# Patient Record
Sex: Male | Born: 1985 | Race: Black or African American | Hispanic: No | Marital: Single | Smoking: Never smoker
Health system: Southern US, Community
[De-identification: ages and names within clinical notes are randomized; demographics above are authoritative.]

## PROBLEM LIST (undated history)

## (undated) ENCOUNTER — Emergency Department (HOSPITAL_BASED_OUTPATIENT_CLINIC_OR_DEPARTMENT_OTHER): Admission: EM | Payer: Medicaid Other

## (undated) DIAGNOSIS — I639 Cerebral infarction, unspecified: Secondary | ICD-10-CM

## (undated) DIAGNOSIS — N1831 Chronic kidney disease, stage 3a: Secondary | ICD-10-CM

## (undated) DIAGNOSIS — I10 Essential (primary) hypertension: Secondary | ICD-10-CM

---
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
## 2007-09-22 ENCOUNTER — Emergency Department (HOSPITAL_COMMUNITY): Admission: EM | Admit: 2007-09-22 | Discharge: 2007-09-22 | Payer: Self-pay | Admitting: Emergency Medicine

## 2008-02-14 ENCOUNTER — Emergency Department (HOSPITAL_COMMUNITY): Admission: EM | Admit: 2008-02-14 | Discharge: 2008-02-14 | Payer: Self-pay | Admitting: Emergency Medicine

## 2009-08-01 ENCOUNTER — Emergency Department (HOSPITAL_COMMUNITY): Admission: EM | Admit: 2009-08-01 | Discharge: 2009-08-01 | Payer: Self-pay | Admitting: Family Medicine

## 2009-08-02 ENCOUNTER — Emergency Department (HOSPITAL_COMMUNITY): Admission: EM | Admit: 2009-08-02 | Discharge: 2009-08-03 | Payer: Self-pay | Admitting: Emergency Medicine

## 2009-08-02 ENCOUNTER — Emergency Department (HOSPITAL_COMMUNITY): Admission: EM | Admit: 2009-08-02 | Discharge: 2009-08-02 | Payer: Self-pay | Admitting: Family Medicine

## 2009-12-14 ENCOUNTER — Emergency Department (HOSPITAL_BASED_OUTPATIENT_CLINIC_OR_DEPARTMENT_OTHER): Admission: EM | Admit: 2009-12-14 | Discharge: 2009-12-14 | Payer: Self-pay | Admitting: Emergency Medicine

## 2010-09-24 LAB — DIFFERENTIAL
Basophils Relative: 0 % (ref 0–1)
Eosinophils Relative: 0 % (ref 0–5)
Lymphocytes Relative: 24 % (ref 12–46)
Lymphs Abs: 1.6 10*3/uL (ref 0.7–4.0)
Monocytes Absolute: 0.9 10*3/uL (ref 0.1–1.0)
Monocytes Relative: 14 % — ABNORMAL HIGH (ref 3–12)
Neutro Abs: 4.2 10*3/uL (ref 1.7–7.7)
Neutrophils Relative %: 62 % (ref 43–77)

## 2010-09-24 LAB — POCT I-STAT, CHEM 8
Calcium, Ion: 1.06 mmol/L — ABNORMAL LOW (ref 1.12–1.32)
HCT: 51 % (ref 39.0–52.0)
Hemoglobin: 17.3 g/dL — ABNORMAL HIGH (ref 13.0–17.0)
TCO2: 26 mmol/L (ref 0–100)

## 2010-09-24 LAB — CBC
Hemoglobin: 15.6 g/dL (ref 13.0–17.0)
MCHC: 33.5 g/dL (ref 30.0–36.0)
RDW: 12.4 % (ref 11.5–15.5)

## 2010-09-24 LAB — GLUCOSE, CAPILLARY: Glucose-Capillary: 301 mg/dL — ABNORMAL HIGH (ref 70–99)

## 2011-04-06 LAB — DIFFERENTIAL
Basophils Absolute: 0
Eosinophils Relative: 5
Lymphs Abs: 2
Monocytes Absolute: 0.3
Monocytes Relative: 7
Neutrophils Relative %: 50

## 2011-04-06 LAB — URINALYSIS, ROUTINE W REFLEX MICROSCOPIC
Bilirubin Urine: NEGATIVE
Hgb urine dipstick: NEGATIVE
Nitrite: NEGATIVE
Specific Gravity, Urine: 1.037 — ABNORMAL HIGH

## 2011-04-06 LAB — BASIC METABOLIC PANEL
Calcium: 9.7
GFR calc Af Amer: >60
GFR calc non Af Amer: >60
Sodium: 133 — ABNORMAL LOW

## 2011-04-06 LAB — CBC
HCT: 47.2
Hemoglobin: 16
WBC: 5.3

## 2011-04-06 LAB — URINE MICROSCOPIC-ADD ON: Urine-Other: NONE SEEN

## 2012-01-05 ENCOUNTER — Encounter (HOSPITAL_COMMUNITY): Payer: Self-pay

## 2012-01-05 ENCOUNTER — Emergency Department (HOSPITAL_COMMUNITY)
Admission: EM | Admit: 2012-01-05 | Discharge: 2012-01-05 | Disposition: A | Discharge status: Home / Self Care | Payer: 59 | Attending: Emergency Medicine | Admitting: Emergency Medicine

## 2012-01-05 DIAGNOSIS — Z886 Allergy status to analgesic agent status: Secondary | ICD-10-CM | POA: Insufficient documentation

## 2012-01-05 DIAGNOSIS — E119 Type 2 diabetes mellitus without complications: Secondary | ICD-10-CM | POA: Insufficient documentation

## 2012-01-05 DIAGNOSIS — R109 Unspecified abdominal pain: Secondary | ICD-10-CM

## 2012-01-05 DIAGNOSIS — R070 Pain in throat: Secondary | ICD-10-CM | POA: Insufficient documentation

## 2012-01-05 DIAGNOSIS — J029 Acute pharyngitis, unspecified: Secondary | ICD-10-CM

## 2012-01-05 DIAGNOSIS — Z794 Long term (current) use of insulin: Secondary | ICD-10-CM | POA: Insufficient documentation

## 2012-01-05 DIAGNOSIS — R197 Diarrhea, unspecified: Secondary | ICD-10-CM

## 2012-01-05 DIAGNOSIS — R1084 Generalized abdominal pain: Secondary | ICD-10-CM | POA: Insufficient documentation

## 2012-01-05 LAB — RAPID STREP SCREEN (MED CTR MEBANE ONLY): Streptococcus, Group A Screen (Direct): NEGATIVE

## 2012-01-05 LAB — POCT I-STAT, CHEM 8
BUN: 19 mg/dL (ref 6–23)
Chloride: 107 mEq/L (ref 96–112)
HCT: 46 % (ref 39.0–52.0)
Hemoglobin: 15.6 g/dL (ref 13.0–17.0)
Sodium: 139 mEq/L (ref 135–145)
TCO2: 20 mmol/L (ref 0–100)

## 2012-01-05 MED ORDER — HYDROCODONE-ACETAMINOPHEN 5-325 MG PO TABS: 1.0000 | ORAL_TABLET | Freq: Four times a day (QID) | ORAL | Status: AC | PRN

## 2012-01-05 MED ORDER — HYDROCODONE-ACETAMINOPHEN 5-325 MG PO TABS
1.0000 | ORAL_TABLET | Freq: Once | ORAL | Status: AC
  Administered 2012-01-05: 1 via ORAL
  Filled 2012-01-05: qty 1

## 2012-01-05 MED ORDER — ONDANSETRON HCL 4 MG PO TABS: 4.0000 mg | ORAL_TABLET | Freq: Three times a day (TID) | ORAL | Status: AC | PRN

## 2012-01-05 MED ORDER — ONDANSETRON 8 MG PO TBDP
8.0000 mg | ORAL_TABLET | Freq: Once | ORAL | Status: AC
  Administered 2012-01-05: 8 mg via ORAL
  Filled 2012-01-05: qty 1

## 2012-01-05 NOTE — ED Provider Notes (Signed)
Medical screening examination/treatment/procedure(s) were performed by non-physician practitioner and as supervising physician I was immediately available for consultation/collaboration.  Toy Baker, MD 01/05/12 1544

## 2012-01-05 NOTE — Discharge Instructions (Signed)
Read the information below.  Please drink plenty of fluids over the next few days.  If you develop severe abdominal pain, fevers, inability to tolerate fluids by mouth, or if you have trouble swallowing or breathing return to the ER immediately for a recheck. You may return to the ER at any time for worsening condition or any new symptoms that concern you.  Antibiotic Nonuse  Your caregiver felt that the infection or problem was not one that would be helped with an antibiotic. Infections may be caused by viruses or bacteria. Only a caregiver can tell which one of these is the likely cause of an illness. A cold is the most common cause of infection in both adults and children. A cold is a virus. Antibiotic treatment will have no effect on a viral infection. Viruses can lead to many lost days of work caring for sick children and many missed days of school. Children may catch as many as 10 "colds" or "flus" per year during which they can be tearful, cranky, and uncomfortable. The goal of treating a virus is aimed at keeping the ill person comfortable. Antibiotics are medications used to help the body fight bacterial infections. There are relatively few types of bacteria that cause infections but there are hundreds of viruses. While both viruses and bacteria cause infection they are very different types of germs. A viral infection will typically go away by itself within 7 to 10 days. Bacterial infections may spread or get worse without antibiotic treatment. Examples of bacterial infections are:  Sore throats (like strep throat or tonsillitis).   Infection in the lung (pneumonia).   Ear and skin infections.  Examples of viral infections are:  Colds or flus.   Most coughs and bronchitis.   Sore throats not caused by Strep.   Runny noses.  It is often best not to take an antibiotic when a viral infection is the cause of the problem. Antibiotics can kill off the helpful bacteria that we have inside our  body and allow harmful bacteria to start growing. Antibiotics can cause side effects such as allergies, nausea, and diarrhea without helping to improve the symptoms of the viral infection. Additionally, repeated uses of antibiotics can cause bacteria inside of our body to become resistant. That resistance can be passed onto harmful bacterial. The next time you have an infection it may be harder to treat if antibiotics are used when they are not needed. Not treating with antibiotics allows our own immune system to develop and take care of infections more efficiently. Also, antibiotics will work better for [place] when they are prescribed for bacterial infections. Treatments for a child that is ill may include:  Give extra fluids throughout the day to stay hydrated.   Get plenty of rest.   Only give your child over-the-counter or prescription medicines for pain, discomfort, or fever as directed by your caregiver.   The use of a cool mist humidifier may help stuffy noses.   Cold medications if suggested by your caregiver.  Your caregiver may decide to start you on an antibiotic if:  The problem you were seen for today continues for a longer length of time than expected.   You develop a secondary bacterial infection.  SEEK MEDICAL CARE IF:  Fever lasts longer than 5 days.   Symptoms continue to get worse after 5 to 7 days or become severe.   Difficulty in breathing develops.   Signs of dehydration develop (poor drinking, rare urinating, dark colored  urine).   Changes in behavior or worsening tiredness (listlessness or lethargy).  Document Released: 09/03/2001 Document Revised: 06/14/2011 Document Reviewed: 03/02/2009 [facility] Patient Information 2012 [place], [place].

## 2012-01-05 NOTE — ED Provider Notes (Signed)
History     CSN: [id number]  Arrival date & time 01/05/12  0714   First MD Initiated Contact with Patient 01/05/12 0801      Chief Complaint  Patient presents with  . Sore Throat  . Diarrhea    (Consider location/radiation/quality/duration/timing/severity/associated sxs/prior treatment) HPI Comments: Patient reports sore throat, abdominal pain and diarrhea that began yesterday.  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  Throat feels sore and swollen, similar to previous episodes of strep throat.  Abdominal pain is diffuse, has had nausea and diarrhea (4-5 episodes).  Diarrhea has appearance of "sweet potatoes" - denies blood, melena.  Denies fevers, vomiting, cough, nasal congestion.  Pt has hx type II diabetes, blood sugars have been in the mid-100s.  No hx abdominal surgeries.    Patient is a 26 y.o. male presenting with pharyngitis and diarrhea. The history is provided by the patient.  Sore Throat The current episode started yesterday. The problem occurs constantly. Associated symptoms include abdominal pain, chills, nausea and a sore throat. Pertinent negatives include no coughing, fever or vomiting.  Diarrhea The primary symptoms include abdominal pain, nausea and diarrhea. Primary symptoms do not include fever or vomiting.  The illness is also significant for chills.    Past Medical History  Diagnosis Date  . Diabetes mellitus     No past surgical history on file.  No family history on file.  History  Substance Use Topics  . Smoking status: Never Smoker   . Smokeless tobacco: Not on file  . Alcohol Use: Yes     socially      Review of Systems  Constitutional: Positive for chills. Negative for fever.  HENT: Positive for sore throat. Negative for ear pain and trouble swallowing.   Respiratory: Negative for cough and shortness of breath.   Gastrointestinal: Positive for nausea, abdominal pain and diarrhea. Negative for  vomiting.    Allergies  Ibuprofen  Home Medications   Current Outpatient Rx  Name Route Sig Dispense Refill  . INSULIN LISPRO (HUMAN) 100 UNIT/ML [place] SOLN Subcutaneous Inject 2 Units into the skin every hour. Diabetic insulin pump      BP 153/86  Pulse 95  Temp 98.8 F (37.1 C) (Oral)  Resp 16  SpO2 99%  Physical Exam  Nursing note and vitals reviewed. Constitutional: He is oriented to person, place, and time. He appears well-developed and well-nourished. No distress.  HENT:  Head: Normocephalic and atraumatic.  Nose: Nose normal.  Mouth/Throat: Uvula is midline. Mucous membranes are not dry. No uvula swelling. Posterior oropharyngeal erythema present. No oropharyngeal exudate, posterior oropharyngeal edema or tonsillar abscesses.  Neck: Trachea normal, normal range of motion and phonation normal. Neck supple. No tracheal tenderness present. No rigidity. No tracheal deviation and normal range of motion present.  Cardiovascular: Normal rate and regular rhythm.   Pulmonary/Chest: Effort normal and breath sounds normal. No stridor.  Abdominal: Soft. He exhibits no distension and no mass. There is no tenderness. There is no rebound and no guarding.  Neurological: He is alert and oriented to person, place, and time.  Skin: He is not diaphoretic.    ED Course  Procedures (including critical care time)  Labs Reviewed  POCT I-STAT, CHEM 8 - Abnormal; Notable for the following:    Potassium 3.4 (*)     Glucose, Bld 173 (*)     All other components within normal limits  RAPID STREP SCREEN   No results  found.  Filed Vitals:   01/05/12 0956  BP: 122/61  Pulse: 77  Temp: 98.4 F (36.9 C)  Resp: 18    9:30 AM Patient is tolerating PO fluids, symptoms improved.    1. Abdominal pain   2. Diarrhea   3. Sore throat       MDM  Pt with one day of diarrhea and sore throat.  Afebrile, nontoxic, abdominal exam is benign.   Rapid strep is negative.  Likely early viral  process.  Pt given return precautions.  D/C home with norco, zofran.  Patient verbalizes understanding and agrees with plan.          Dillard Cannon Ono, [place] 01/05/12 1232

## 2012-01-05 NOTE — ED Notes (Signed)
Pt states that sore throat, nausea and diarrhea started yesterday.  He has had diarrhea 4 times since yesterday.

## 2012-01-08 ENCOUNTER — Encounter (HOSPITAL_COMMUNITY): Payer: Self-pay | Admitting: Emergency Medicine

## 2012-01-08 ENCOUNTER — Emergency Department (HOSPITAL_COMMUNITY)
Admission: EM | Admit: 2012-01-08 | Discharge: 2012-01-09 | Disposition: A | Discharge status: Home / Self Care | Payer: 59 | Attending: Emergency Medicine | Admitting: Emergency Medicine

## 2012-01-08 DIAGNOSIS — E119 Type 2 diabetes mellitus without complications: Secondary | ICD-10-CM | POA: Insufficient documentation

## 2012-01-08 DIAGNOSIS — J040 Acute laryngitis: Secondary | ICD-10-CM

## 2012-01-08 LAB — RAPID STREP SCREEN (MED CTR MEBANE ONLY): Streptococcus, Group A Screen (Direct): NEGATIVE

## 2012-01-08 NOTE — ED Provider Notes (Signed)
History     CSN: [id number]  Arrival date & time 01/08/12  2115   First MD Initiated Contact with Patient 01/08/12 2214      Chief Complaint  Patient presents with  . Sore Throat    (Consider location/radiation/quality/duration/timing/severity/associated sxs/prior treatment) Patient is a 26 y.o. male presenting with pharyngitis.  Sore Throat Associated symptoms include coughing and a sore throat. Pertinent negatives include no fever.   26 year old male complaining of sore throat for roughly a week. Denies fever. Affirms cough, runny nose, hoarseness, pain is constant, not exacerbated by swallowing.  Past Medical History  Diagnosis Date  . Diabetes mellitus     History reviewed. No pertinent past surgical history.  History reviewed. No pertinent family history.  History  Substance Use Topics  . Smoking status: Never Smoker   . Smokeless tobacco: Not on file  . Alcohol Use: Yes     socially      Review of Systems  Constitutional: Negative for fever.  HENT: Positive for sore throat and rhinorrhea.   Respiratory: Positive for cough.   All other systems reviewed and are negative.    Allergies  Ibuprofen  Home Medications   Current Outpatient Rx  Name Route Sig Dispense Refill  . HYDROCODONE-ACETAMINOPHEN 5-325 MG PO TABS Oral Take 1 tablet by mouth every 6 (six) hours as needed for pain. 10 tablet 0  . INSULIN LISPRO (HUMAN) 100 UNIT/ML [place] SOLN Subcutaneous Inject 2 Units into the skin every hour. Diabetic insulin pump    . ONDANSETRON HCL 4 MG PO TABS Oral Take 1 tablet (4 mg total) by mouth every 8 (eight) hours as needed for nausea. 12 tablet 0    BP 139/77  Pulse 98  Temp 98.4 F (36.9 C) (Oral)  Resp 16  SpO2 100%  Physical Exam  Nursing note and vitals reviewed. Constitutional: He is oriented to person, place, and time. He appears well-developed and well-nourished. No distress.       Voice is hoarse  HENT:  Head: Normocephalic.  Right Ear:  External ear normal.  Left Ear: External ear normal.  Nose: Nose normal.  Mouth/Throat: Oropharynx is clear and moist. No oropharyngeal exudate.  Eyes: Conjunctivae and EOM are normal.  Neck:       Mild shotty, nontender AC LAD  Cardiovascular: Normal rate.   Pulmonary/Chest: Effort normal.  Musculoskeletal: Normal range of motion.  Neurological: He is alert and oriented to person, place, and time.  Psychiatric: He has a normal mood and affect.    ED Course  Procedures (including critical care time)   Labs Reviewed  RAPID STREP SCREEN  STREP A DNA PROBE  LAB REPORT - SCANNED   No results found.   1. Laryngitis, acute       MDM  Rapid strep negative. We'll send for DNA probe confirmation. Not consistent with strep by center criteria. We'll treat as laryngitis with vocal rest, and viscous lidocaine for comfort.  Pt verbalized understanding and agrees with care plan. Outpatient follow-up and return precautions given.           Wynetta Emery, PA-C 01/13/12 1025

## 2012-01-08 NOTE — ED Notes (Signed)
Patient states that he was here on Saturday with the same s/s - the patient reports that his throat is worse and he would like to have his throat be reevaluated

## 2012-01-09 LAB — STREP A DNA PROBE: Group A Strep Probe: NEGATIVE

## 2012-01-09 MED ORDER — BENZONATATE 100 MG PO CAPS: 100.0000 mg | ORAL_CAPSULE | Freq: Three times a day (TID) | ORAL | Status: AC

## 2012-01-09 MED ORDER — LIDOCAINE VISCOUS 2 % MT SOLN: 20.0000 mL | Freq: Three times a day (TID) | OROMUCOSAL | Status: AC | PRN

## 2012-01-16 NOTE — ED Provider Notes (Signed)
Medical screening examination/treatment/procedure(s) were performed by non-physician practitioner and as supervising physician I was immediately available for consultation/collaboration.  Yohan Samons, MD 01/16/12 0927 

## 2012-01-19 ENCOUNTER — Ambulatory Visit (INDEPENDENT_AMBULATORY_CARE_PROVIDER_SITE_OTHER): Payer: 59 | Admitting: Emergency Medicine

## 2012-01-19 VITALS — BP 130/70 | HR 80 | Temp 98.0°F | Resp 16 | Ht 67.75 in | Wt 198.6 lb

## 2012-01-19 DIAGNOSIS — R197 Diarrhea, unspecified: Secondary | ICD-10-CM

## 2012-01-19 DIAGNOSIS — E119 Type 2 diabetes mellitus without complications: Secondary | ICD-10-CM

## 2012-01-19 LAB — POCT CBC
HCT, POC: 49.8 % (ref 43.5–53.7)
Hemoglobin: 16.1 g/dL (ref 14.1–18.1)
MCHC: 32.3 g/dL (ref 31.8–35.4)
MCV: 87.1 fL (ref 80–97)
MPV: 8.5 fL (ref 0–99.8)
POC Granulocyte: 2 (ref 2–6.9)
Platelet Count, POC: 389 10*3/uL (ref 142–424)
RDW, POC: 13.8 %
WBC: 4.8 10*3/uL (ref 4.6–10.2)

## 2012-01-19 NOTE — Progress Notes (Signed)
  Subjective:    Patient ID: Bradley Davis, male    DOB: [birth year], 26 y.o.   MRN: [id number]  HPI patient is recently had 2 visits to the emergency room with abdominal pain and diarrhea. He is an insulin-dependent diabetic he states his sugars have been under reasonable control.    Review of Systems     Objective:   Physical Exam  Constitutional: He appears well-developed and well-nourished.  HENT:  Head: Normocephalic.  Eyes: Pupils are equal, round, and reactive to light.  Neck: No thyromegaly present.  Cardiovascular: Normal rate.   Pulmonary/Chest: Breath sounds normal. No respiratory distress. He has no wheezes. He has no rales.  Abdominal:       The abdomen is flat. Bowel sounds are normal. There is mild tenderness in the left lower abdomen.      Results for orders placed in visit on 01/19/12  POCT CBC      Component Value Range   WBC 4.8  4.6 - 10.2 K/uL   Lymph, poc 2.6  0.6 - 3.4   POC LYMPH PERCENT 53.3 (*) 10 - 50 %L   MID (cbc) 0.3  0 - 0.9   POC MID % 5.3  0 - 12 %M   POC Granulocyte 2.0  2 - 6.9   Granulocyte percent 41.4  37 - 80 %G   RBC 5.72  4.69 - 6.13 M/uL   Hemoglobin 16.1  14.1 - 18.1 g/dL   HCT, POC 13.0  86.5 - 53.7 %   MCV 87.1  80 - 97 fL   MCH, POC 28.1  27 - 31.2 pg   MCHC 32.3  31.8 - 35.4 g/dL   RDW, POC 78.4     Platelet Count, POC 389  142 - 424 K/uL   MPV 8.5  0 - 99.8 fL  GLUCOSE, POCT (MANUAL RESULT ENTRY)      Component Value Range   POC Glucose 154 (*) 70 - 99 mg/dl      Assessment & Plan:  Patient will be on none G2 lots of fluids he was given supplies to collect his stools.

## 2012-01-19 NOTE — Patient Instructions (Addendum)
Diarrhea Infections caused by germs (bacterial) or a virus commonly cause diarrhea. Your caregiver has determined that with time, rest and fluids, the diarrhea should improve. In general, eat normally while drinking more water than usual. Although water may prevent dehydration, it does not contain salt and minerals (electrolytes). Broths, weak tea without caffeine and oral rehydration solutions (ORS) replace fluids and electrolytes. Small amounts of fluids should be taken frequently. Large amounts at one time may not be tolerated. Plain water may be harmful in infants and the elderly. Oral rehydrating solutions (ORS) are available at pharmacies and grocery stores. ORS replace water and important electrolytes in proper proportions. Sports drinks are not as effective as ORS and may be harmful due to sugars worsening diarrhea.  ORS is especially recommended for use in children with diarrhea. As a general guideline for children, replace any new fluid losses from diarrhea and/or vomiting with ORS as follows:   If your child weighs 22 pounds or under (10 kg or less), give 60-120 mL ( -  cup or 2 - 4 ounces) of ORS for each episode of diarrheal stool or vomiting episode.   If your child weighs more than 22 pounds (more than 10 kgs), give 120-240 mL ( - 1 cup or 4 - 8 ounces) of ORS for each diarrheal stool or episode of vomiting.   While correcting for dehydration, children should eat normally. However, foods high in sugar should be avoided because this may worsen diarrhea. Large amounts of carbonated soft drinks, juice, gelatin desserts and other highly sugared drinks should be avoided.   After correction of dehydration, other liquids that are appealing to the child may be added. Children should drink small amounts of fluids frequently and fluids should be increased as tolerated. Children should drink enough fluids to keep urine clear or pale yellow.   Adults should eat normally while drinking more fluids  than usual. Drink small amounts of fluids frequently and increase as tolerated. Drink enough fluids to keep urine clear or pale yellow. Broths, weak decaffeinated tea, lemon lime soft drinks (allowed to go flat) and ORS replace fluids and electrolytes.   Avoid:   Carbonated drinks.   Juice.   Extremely hot or cold fluids.   Caffeine drinks.   Fatty, greasy foods.   Alcohol.   Tobacco.   Too much intake of anything at one time.   Gelatin desserts.   Probiotics are active cultures of beneficial bacteria. They may lessen the amount and number of diarrheal stools in adults. Probiotics can be found in yogurt with active cultures and in supplements.   Wash hands well to avoid spreading bacteria and virus.   Anti-diarrheal medications are not recommended for infants and children.   Only take over-the-counter or prescription medicines for pain, discomfort or fever as directed by your caregiver. Do not give aspirin to children because it may cause Reye's Syndrome.   For adults, ask your caregiver if you should continue all prescribed and over-the-counter medicines.   If your caregiver has given you a follow-up appointment, it is very important to keep that appointment. Not keeping the appointment could result in a chronic or permanent injury, and disability. If there is any problem keeping the appointment, you must call back to this facility for assistance.  SEEK IMMEDIATE MEDICAL CARE IF:   You or your child is unable to keep fluids down or other symptoms or problems become worse in spite of treatment.   Vomiting or diarrhea develops and becomes persistent.     There is vomiting of blood or bile (green material).   There is blood in the stool or the stools are black and tarry.   There is no urine output in 6-8 hours or there is only a small amount of very dark urine.   Abdominal pain develops, increases or localizes.   You have a fever.   Your baby is older than 3 months with a  rectal temperature of 102 F (38.9 C) or higher.   Your baby is 3 months old or younger with a rectal temperature of 100.4 F (38 C) or higher.   You or your child develops excessive weakness, dizziness, fainting or extreme thirst.   You or your child develops a rash, stiff neck, severe headache or become irritable or sleepy and difficult to awaken.  MAKE SURE YOU:   Understand these instructions.   Will watch your condition.   Will get help right away if you are not doing well or get worse.  Document Released: 06/15/2002 Document Revised: 06/14/2011 Document Reviewed: 05/02/2009 [employer] Patient Information 2012 [employer]. 

## 2012-01-25 ENCOUNTER — Telehealth: Payer: Self-pay | Admitting: Emergency Medicine

## 2012-01-29 LAB — STOOL CULTURE

## 2012-02-08 NOTE — Telephone Encounter (Signed)
Patient was called to return a stool specimen container he was given no encounter occurred.

## 2013-08-27 ENCOUNTER — Ambulatory Visit (INDEPENDENT_AMBULATORY_CARE_PROVIDER_SITE_OTHER): Payer: 59 | Admitting: Emergency Medicine

## 2013-08-27 VITALS — BP 138/72 | HR 122 | Temp 100.6°F | Resp 16 | Ht 68.0 in | Wt 233.0 lb

## 2013-08-27 DIAGNOSIS — J111 Influenza due to unidentified influenza virus with other respiratory manifestations: Secondary | ICD-10-CM

## 2013-08-27 MED ORDER — PSEUDOEPHEDRINE-GUAIFENESIN ER 60-600 MG PO TB12: 1.0000 | ORAL_TABLET | Freq: Two times a day (BID) | ORAL | Status: AC

## 2013-08-27 MED ORDER — OSELTAMIVIR PHOSPHATE 75 MG PO CAPS: 75.0000 mg | ORAL_CAPSULE | Freq: Two times a day (BID) | ORAL | Status: DC

## 2013-08-27 MED ORDER — HYDROCOD POLST-CHLORPHEN POLST 10-8 MG/5ML PO LQCR: 5.0000 mL | Freq: Two times a day (BID) | ORAL | Status: DC | PRN

## 2013-08-27 NOTE — Patient Instructions (Signed)

## 2013-08-27 NOTE — Progress Notes (Signed)
[facility] [address], [place] [id number] [phone]  Date:  08/27/2013   Name:  Gwynneth AlimentKimmen M Coran   DOB:  [birth year]   MRN:  [id number]  PCP:  No primary provider on file.    Chief Complaint: Fever, Chills, Headache, Edema and Cough   History of Present Illness:  Ison Bartholome BillM Lorincz is a 28 y.o. very pleasant male patient who presents with the following:  Tuesday night developed a runny nose.  Yesterday developed a fever and cough not productive.  Muscle aches and fatigue.  Arthralgias.  No sick contacts.  Fever and chills.  Poor po intake.  No nausea or vomiting.  No stool change or rash.  No improvement with over the counter medications or other home remedies. Denies other complaint or health concern today.   There are no active problems to display for this patient.   Past Medical History  Diagnosis Date  . Diabetes mellitus     No past surgical history on file.  History  Substance Use Topics  . Smoking status: Never Smoker   . Smokeless tobacco: Not on file  . Alcohol Use: Yes     Comment: socially    No family history on file.  Allergies  Allergen Reactions  . Ibuprofen Swelling    Medication list has been reviewed and updated.  Current Outpatient Prescriptions on File Prior to Visit  Medication Sig Dispense Refill  . insulin lispro (HUMALOG) 100 UNIT/ML injection Inject 2 Units into the skin every hour. Diabetic insulin pump       No current facility-administered medications on file prior to visit.    Review of Systems:  As per HPI, otherwise negative.    Physical Examination: Filed Vitals:   08/27/13 2052  BP: 138/72  Pulse: 122  Temp: 100.6 F (38.1 C)  Resp: 16   Filed Vitals:   08/27/13 2052  Height: 5\' 8"  (1.727 m)  Weight: 233 lb (105.688 kg)   Body mass index is 35.44 kg/(m^2). Ideal Body Weight: Weight in (lb) to have BMI = 25: 164.1  GEN: WDWN, NAD, Non-toxic, A & O x 3 HEENT: Atraumatic, Normocephalic.  Neck supple. No masses, No LAD. Ears and Nose: No external deformity. CV: RRR, No M/G/R. No JVD. No thrill. No extra heart sounds. PULM: CTA B, no wheezes, crackles, rhonchi. No retractions. No resp. distress. No accessory muscle use. ABD: S, NT, ND, +BS. No rebound. No HSM. EXTR: No c/c/e NEURO Normal gait.  PSYCH: Normally interactive. Conversant. Not depressed or anxious appearing.  Calm demeanor.    Assessment and Plan: Influenza tamiflu tussionex mucinex   Signed,  Phillips OdorJeffery Dareon Nunziato, MD

## 2013-10-08 ENCOUNTER — Ambulatory Visit (INDEPENDENT_AMBULATORY_CARE_PROVIDER_SITE_OTHER): Payer: 59 | Admitting: Family Medicine

## 2013-10-08 VITALS — BP 118/70 | HR 79 | Temp 97.9°F | Resp 16 | Ht 68.0 in | Wt 217.8 lb

## 2013-10-08 DIAGNOSIS — Z0000 Encounter for general adult medical examination without abnormal findings: Secondary | ICD-10-CM

## 2013-10-08 NOTE — Patient Instructions (Signed)
Please come and see us Monday morning (if you like, come down to [address]- I will be there Monday am) and we will place your PPD Take your DMV paperwork over to your endocrinology office and leave with the front desk.    Take care!

## 2013-10-08 NOTE — Progress Notes (Signed)
[facility] [address], [place] [id number] [phone]  Date:  10/08/2013   Name:  Bradley Davis   DOB:  Sep 05, 1985   MRN:  [id number]  PCP:  No primary provider on file.    Chief Complaint: Employment Physical   History of Present Illness:  Bradley Davis is a 28 y.o. very pleasant male patient who presents with the following:  He has a history of type 1 DM.  He uses a pump. 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  It seems he spoke to them and was told they did not do this paperwork- however I suspect they thought he needed a DOT certification.  Otherwise he denies any significant medical history.  His only chronic medication is his insulin.  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    He declines any labs or immunizations today.  Really just wants his form completed  There are no active problems to display for this patient.   Past Medical History  Diagnosis Date  . Diabetes mellitus     History reviewed. No pertinent past surgical history.  History  Substance Use Topics  . Smoking status: Never Smoker   . Smokeless tobacco: Not on file  . Alcohol Use: Yes     Comment: socially    No family history on file.  Allergies  Allergen Reactions  . Ibuprofen Swelling    Medication list has been reviewed and updated.  Current Outpatient Prescriptions on File Prior to Visit  Medication Sig Dispense Refill  . insulin lispro (HUMALOG) 100 UNIT/ML injection Inject 2 Units into the skin every hour. Diabetic insulin pump      . chlorpheniramine-HYDROcodone (TUSSIONEX PENNKINETIC ER) 10-8 MG/5ML LQCR Take 5 mLs by mouth every 12 (twelve) hours as needed.  60 mL  0  . oseltamivir (TAMIFLU) 75 MG capsule Take 1 capsule (75 mg total) by mouth 2 (two) times daily.  10 capsule  0  . pseudoephedrine-guaifenesin (MUCINEX D) 60-600 MG per tablet Take 1 tablet by mouth every 12  (twelve) hours.  18 tablet  0   No current facility-administered medications on file prior to visit.    Review of Systems:  As per HPI- otherwise negative.   Physical Examination: Filed Vitals:   10/08/13 0833  BP: 118/70  Pulse: 79  Temp: 97.9 F (36.6 C)  Resp: 16   Filed Vitals:   10/08/13 0833  Height: 5\' 8"  (1.727 m)  Weight: 217 lb 12.8 oz (98.793 kg)   Body mass index is 33.12 kg/(m^2). Ideal Body Weight: Weight in (lb) to have BMI = 25: 164.1  GEN: WDWN, NAD, Non-toxic, A & O x 3, looks well HEENT: Atraumatic, Normocephalic. Neck supple. No masses, No LAD. Ears and Nose: No external deformity. CV: RRR, No M/G/R. No JVD. No thrill. No extra heart sounds. PULM: CTA B, no wheezes, crackles, rhonchi. No retractions. No resp. distress. No accessory muscle use. ABD: S, NT, ND, +BS. No rebound. No HSM. EXTR: No c/c/e NEURO Normal gait.  PSYCH: Normally interactive. Conversant. Not depressed or anxious appearing.  Calm demeanor.  Gu: normal exam, no masses or lesions, no discharge   Assessment and Plan: Physical exam  Screening-pulmonary TB - Plan: TB Skin Test  Completed form for coaching track.  He believes all his immunizations are UTD, tetanus less than 10 years ago.   Planned to place  ppd but he is unable to return for a read this weekend.  He will have the PPD placed on Monday Called [facility]- it was a misunderstanding and they can do his DMV paperwork.  This would be most appropriate as the form requires detailed information about his diabetes which I do not have.    Signed Abbe Amsterdam, MD

## 2013-10-08 NOTE — Progress Notes (Signed)
  Tuberculosis Risk Questionnaire  1. No Were you born outside the [place] in one of the following parts of the world: [place], [place], [place], [place] or [place]?    2. No Have you traveled outside the [place] and lived for more than one month in one of the following parts of the world: [place], [place], [place], [place] or [place]?    3. Yes, Diabetes Do you have a compromised immune system such as from any of the following conditions:HIV/AIDS, organ or bone marrow transplantation, diabetes, immunosuppressive medicines (e.g. Prednisone, Remicaide), leukemia, lymphoma, cancer of the head or neck, gastrectomy or jejunal bypass, end-stage renal disease (on dialysis), or silicosis?     4. Yes Works in jail Have you ever or do you plan on working in: a residential care center, a health care facility, a jail or prison or homeless shelter?    5. No Have you ever: injected illegal drugs, used crack cocaine, lived in a homeless shelter  or been in jail or prison?     6. No Have you ever been exposed to anyone with infectious tuberculosis?    Tuberculosis Symptom Questionnaire  Do you currently have any of the following symptoms?  1. No Unexplained cough lasting more than 3 weeks?   2. No Unexplained fever lasting more than 3 weeks.   3. Yes  Night Sweats (sweating that leaves the bedclothes and sheets wet)     4. No Shortness of Breath   5. No Chest Pain   6. No Unintentional weight loss    7. No Unexplained fatigue (very tired for no reason)

## 2013-10-08 NOTE — Addendum Note (Signed)
Addended by: Abbe AmsterdamOPLAND, Yee Gangi C on: 10/08/2013 04:50 PM   Modules accepted: Level of Service

## 2013-10-12 ENCOUNTER — Ambulatory Visit (INDEPENDENT_AMBULATORY_CARE_PROVIDER_SITE_OTHER): Payer: 59 | Admitting: Family Medicine

## 2013-10-12 DIAGNOSIS — Z111 Encounter for screening for respiratory tuberculosis: Secondary | ICD-10-CM

## 2013-10-12 NOTE — Progress Notes (Signed)
Here just for a PPD today as he was not able to do this last week (could not RTC for a read).   Pt not examined- PPD placed

## 2013-10-12 NOTE — Progress Notes (Signed)
  Tuberculosis Risk Questionnaire  1. No Were you born outside the [place] in one of the following parts of the world: [place], [place], [place], [place] or [place]?    2. No Have you traveled outside the [place] and lived for more than one month in one of the following parts of the world: [place], [place], [place], [place] or [place]?    3. No Do you have a compromised immune system such as from any of the following conditions:HIV/AIDS, organ or bone marrow transplantation, diabetes, immunosuppressive medicines (e.g. Prednisone, Remicaide), leukemia, lymphoma, cancer of the head or neck, gastrectomy or jejunal bypass, end-stage renal disease (on dialysis), or silicosis?     4. Yes  Have you ever or do you plan on working in: a residential care center, a health care facility, a jail or prison or homeless shelter?    5. No Have you ever: injected illegal drugs, used crack cocaine, lived in a homeless shelter  or been in jail or prison?     6. No Have you ever been exposed to anyone with infectious tuberculosis?    Tuberculosis Symptom Questionnaire  Do you currently have any of the following symptoms?  1. No Unexplained cough lasting more than 3 weeks?   2. No Unexplained fever lasting more than 3 weeks.   3.yes Night Sweats (sweating that leaves the bedclothes and sheets wet)     4. No Shortness of Breath   5. No Chest Pain   6. No Unintentional weight loss    7. No Unexplained fatigue (very tired for no reason)

## 2013-10-14 ENCOUNTER — Ambulatory Visit (INDEPENDENT_AMBULATORY_CARE_PROVIDER_SITE_OTHER): Payer: 59

## 2013-10-14 DIAGNOSIS — Z111 Encounter for screening for respiratory tuberculosis: Secondary | ICD-10-CM

## 2013-10-14 LAB — TB SKIN TEST
Induration: 0 mm
TB Skin Test: NEGATIVE

## 2013-10-21 ENCOUNTER — Telehealth: Payer: Self-pay

## 2013-10-21 NOTE — Telephone Encounter (Signed)
Fax sent stating TB results were negative. No other information provided on the fax- pt advised.

## 2013-10-21 NOTE — Telephone Encounter (Signed)
PATIENT REQUEST WE FAX TO HIS EMPLOYER ATTN:MRS. JEANNIE HIS TB RESULTS.Marland Kitchen.   FAX: [phone]                                                                                                                                       PLEASE CALL PATIENT WHEN DONE.AT CELL PHONE: [phone]

## 2013-10-21 NOTE — Telephone Encounter (Signed)
Patient left message on lab phone.  No labs in chart.  Returned call and left message for patient to call.

## 2017-01-17 ENCOUNTER — Emergency Department (HOSPITAL_BASED_OUTPATIENT_CLINIC_OR_DEPARTMENT_OTHER): Payer: BC Managed Care – PPO

## 2017-01-17 ENCOUNTER — Emergency Department (HOSPITAL_BASED_OUTPATIENT_CLINIC_OR_DEPARTMENT_OTHER)
Admission: EM | Admit: 2017-01-17 | Discharge: 2017-01-17 | Disposition: A | Discharge status: Home / Self Care | Payer: BC Managed Care – PPO | Attending: Emergency Medicine | Admitting: Emergency Medicine

## 2017-01-17 ENCOUNTER — Encounter (HOSPITAL_BASED_OUTPATIENT_CLINIC_OR_DEPARTMENT_OTHER): Payer: Self-pay | Admitting: *Deleted

## 2017-01-17 DIAGNOSIS — Y939 Activity, unspecified: Secondary | ICD-10-CM | POA: Insufficient documentation

## 2017-01-17 DIAGNOSIS — S61111A Laceration without foreign body of right thumb with damage to nail, initial encounter: Secondary | ICD-10-CM | POA: Diagnosis not present

## 2017-01-17 DIAGNOSIS — W260XXA Contact with knife, initial encounter: Secondary | ICD-10-CM | POA: Insufficient documentation

## 2017-01-17 DIAGNOSIS — Z794 Long term (current) use of insulin: Secondary | ICD-10-CM | POA: Diagnosis not present

## 2017-01-17 DIAGNOSIS — S6991XA Unspecified injury of right wrist, hand and finger(s), initial encounter: Secondary | ICD-10-CM | POA: Diagnosis present

## 2017-01-17 DIAGNOSIS — Y999 Unspecified external cause status: Secondary | ICD-10-CM | POA: Diagnosis not present

## 2017-01-17 DIAGNOSIS — Y929 Unspecified place or not applicable: Secondary | ICD-10-CM | POA: Insufficient documentation

## 2017-01-17 DIAGNOSIS — E119 Type 2 diabetes mellitus without complications: Secondary | ICD-10-CM | POA: Diagnosis not present

## 2017-01-17 MED ORDER — LIDOCAINE HCL 2 % IJ SOLN: 10.0000 mL | Freq: Once | INTRAMUSCULAR | Status: DC

## 2017-01-17 MED ORDER — CEPHALEXIN 500 MG PO CAPS: 500.0000 mg | ORAL_CAPSULE | Freq: Four times a day (QID) | ORAL | 0 refills | Status: AC

## 2017-01-17 MED FILL — CEPHALEXIN 500 MG CAPSULE: 500 | 5 days supply | Qty: 20 | Fill #0

## 2017-01-17 NOTE — ED Notes (Signed)
Suture cart out side Pt. room

## 2017-01-17 NOTE — Discharge Instructions (Signed)
Keep wound dry and covered.  Take keflex as prescribed for antibiotic prophylaxis. Return for worsening symptoms, including bleeding under nail, fever, increased redness, swelling or any other symptoms concerning to you.

## 2017-01-17 NOTE — ED Provider Notes (Signed)
[facility]   CSN: [id number] Arrival date & time: 01/17/17  1336     History   Chief Complaint Chief Complaint  Patient presents with  . Laceration    HPI Bradley Davis is a 31 y.o. male.  The history is provided by the patient.  Laceration   The incident occurred 1 to 2 hours ago. Pain location: right thumb. Size: < 1 cm. The laceration mechanism was a a clean knife. The pain is mild. The pain has been constant since onset. He reports no foreign bodies present. His tetanus status is UTD.     31 year old male who presents with laceration to the the right thumb. History of DM. Accidentally cut the tip of the right thumb with knife while cutting peppers. Bleeding controlled with pressure. No other injuries.  Past Medical History:  Diagnosis Date  . Diabetes mellitus     There are no active problems to display for this patient.   History reviewed. No pertinent surgical history.     Home Medications    Prior to Admission medications   Medication Sig Start Date End Date Taking? Authorizing Provider  Insulin Aspart (NOVOLOG [place]) Inject into the skin.   Yes [provider]  insulin lispro (HUMALOG) 100 UNIT/ML injection Inject 2 Units into the skin every hour. Diabetic insulin pump    [provider]    Family History No family history on file.  Social History Social History  Substance Use Topics  . Smoking status: Never Smoker  . Smokeless tobacco: Never Used  . Alcohol use Yes     Comment: socially     Allergies   Ibuprofen   Review of Systems Review of Systems  Constitutional: Negative for fever.  Respiratory: Negative for shortness of breath.   Gastrointestinal: Negative for nausea and vomiting.  Musculoskeletal: Negative for joint swelling.  Skin: Positive for wound.  Allergic/Immunologic: Positive for immunocompromised state (h/o f DM).  Hematological: Does not bruise/bleed easily.     Physical  Exam Updated Vital Signs BP 115/77   Pulse 97   Temp 98.6 F (37 C) (Oral)   Resp 20   Ht 5\' 10"  (1.778 m)   Wt 98.4 kg (217 lb)   SpO2 99%   BMI 31.14 kg/m   Physical Exam Physical Exam  Constitutional: Appears well-developed and well-nourished. No acute distress. HENT:  Head: Normocephalic.  Eyes: Conjunctivae are normal.  Cardiovascular: Normal rate and intact distal pulses.   Pulmonary/Chest: Effort normal. No respiratory distress.  Abdominal: Exhibits no distension.  Musculoskeletal: Normal range of motion. Exhibits no deformity. There is < 1 cm laceration to the distal nail of the right thumb without active bleeding Neurological: Alert. Fluent speech.  Skin: Skin is warm and dry.  Psychiatric: Normal mood and affect. Behavior is normal.  Nursing note and vitals reviewed.   ED Treatments / Results  Labs (all labs ordered are listed, but only abnormal results are displayed) Labs Reviewed - No data to display  EKG  EKG Interpretation None       Radiology Dg Finger Thumb Right  Result Date: 01/17/2017 CLINICAL DATA:  Laceration with knife EXAM: RIGHT THUMB 2+V COMPARISON:  None. FINDINGS: Frontal, oblique, and lateral views obtained. There is soft tissue injury to the distal aspect the first digit with overlying bandage. No other radiopaque foreign body. No soft tissue air. No fracture or dislocation. Joint spaces appear unremarkable. IMPRESSION: Soft tissue injury distally with overlying bandage. No  other radiopaque foreign body beyond the bandage. No soft tissue air. No fracture or dislocation. No evident arthropathy. Electronically Signed   By: Bretta BangWilliam  Woodruff III M.D.   On: 01/17/2017 14:03    Procedures Procedures (including critical care time)  Medications Ordered in ED Medications - No data to display   Initial Impression / Assessment and Plan / ED Course  I have reviewed the triage vital signs and the nursing notes.  Pertinent labs & imaging  results that were available during my care of the patient were reviewed by me and considered in my medical decision making (see chart for details).     Small clean laceration to the distal aspect of the right thumb. Bleeding controlled, no subungual hematoma, proximal part of nail in tact. No severe nailbed injury. X-ray without foreign body or fracture. Not felt removing nail necessary for nailbed repair. Will dress, and continue supportive care. Due to DM, will discharge with keflex.   Final Clinical Impressions(s) / ED Diagnoses   Final diagnoses:  Laceration of right thumb without foreign body with damage to nail, initial encounter    New Prescriptions New Prescriptions   No medications on file     Lavera GuiseLiu, Kendall Justo Duo, MD 01/17/17 1536

## 2017-01-17 NOTE — ED Triage Notes (Signed)
Laceration with a knife this am. He cut through his right thumb nail. Bleeding controlled. [facility].

## 2017-04-07 ENCOUNTER — Ambulatory Visit (HOSPITAL_COMMUNITY)
Admission: EM | Admit: 2017-04-07 | Discharge: 2017-04-07 | Disposition: A | Discharge status: Home / Self Care | Payer: BC Managed Care – PPO

## 2017-04-07 ENCOUNTER — Encounter (HOSPITAL_COMMUNITY): Payer: Self-pay | Admitting: Emergency Medicine

## 2017-04-07 DIAGNOSIS — R197 Diarrhea, unspecified: Secondary | ICD-10-CM | POA: Diagnosis not present

## 2017-04-07 DIAGNOSIS — K529 Noninfective gastroenteritis and colitis, unspecified: Secondary | ICD-10-CM

## 2017-04-07 DIAGNOSIS — R110 Nausea: Secondary | ICD-10-CM | POA: Diagnosis not present

## 2017-04-07 HISTORY — DX: Essential (primary) hypertension: I10

## 2017-04-07 MED ORDER — ONDANSETRON 4 MG PO TBDP
ORAL_TABLET | ORAL | Status: AC
  Filled 2017-04-07: qty 1

## 2017-04-07 MED ORDER — SUCRALFATE 1 G PO TABS: 1.0000 g | ORAL_TABLET | Freq: Three times a day (TID) | ORAL | 0 refills | Status: DC

## 2017-04-07 MED ORDER — ONDANSETRON 4 MG PO TBDP
4.0000 mg | ORAL_TABLET | Freq: Once | ORAL | Status: AC
  Administered 2017-04-07: 4 mg via ORAL

## 2017-04-07 MED ORDER — ONDANSETRON HCL 4 MG PO TABS: 4.0000 mg | ORAL_TABLET | Freq: Four times a day (QID) | ORAL | 0 refills | Status: DC

## 2017-04-07 NOTE — ED Triage Notes (Signed)
Approximately one week ago, patient had 2 days of nausea, vomiting and diarrhea.  Yesterday, symptoms reoccurred.  Patient has had nausea, no vomiting this time and diarrhea.  5 episodes of diarrhea today.

## 2017-04-07 NOTE — ED Provider Notes (Signed)
[facility]    CSN: [id number] Arrival date & time: 04/07/17  1940     History   Chief Complaint Chief Complaint  Patient presents with  . Diarrhea    HPI Bradley Davis is a 31 y.o. male.   31 year old male presents with stomach cramping, nausea or reflux and sits of distention in his stomach. He said diarrhea today 5-7 times was loose initially then turned watery seen no blood. He feels like his reflux and stomach acid. He has taken Pepto and Tums with no relief. Denies fever or chills. Has been drinking carbonated drinks and water today      Past Medical History:  Diagnosis Date  . Diabetes mellitus   . Hypertension     There are no active problems to display for this patient.   History reviewed. No pertinent surgical history.     Home Medications    Prior to Admission medications   Medication Sig Start Date End Date Taking? Authorizing Provider  BENAZEPRIL HCL PO Take by mouth.   Yes [provider]  Insulin Aspart (NOVOLOG [place]) Inject into the skin.    [provider]  insulin lispro (HUMALOG) 100 UNIT/ML injection Inject 2 Units into the skin every hour. Diabetic insulin pump    [provider]  ondansetron (ZOFRAN) 4 MG tablet Take 1 tablet (4 mg total) by mouth every 6 (six) hours. 04/07/17   Hayden Rasmussen, NP  sucralfate (CARAFATE) 1 g tablet Take 1 tablet (1 g total) by mouth 4 (four) times daily -  with meals and at bedtime. 04/07/17   Hayden Rasmussen, NP    Family History No family history on file.  Social History Social History  Substance Use Topics  . Smoking status: Never Smoker  . Smokeless tobacco: Never Used  . Alcohol use Yes     Comment: socially     Allergies   Ibuprofen   Review of Systems Review of Systems  Constitutional: Positive for activity change. Negative for fever.  HENT: Negative.   Respiratory: Negative.   Gastrointestinal: Positive for diarrhea and nausea. Negative for blood in stool,  constipation and vomiting.       Complains of more of a crampy discomfort in the abdomen as opposed to [place] localized pain.  Genitourinary: Negative.   Skin: Negative.   Neurological: Negative.   All other systems reviewed and are negative.    Physical Exam Triage Vital Signs ED Triage Vitals  Enc Vitals Group     BP 04/07/17 2059 (!) 148/83     Pulse Rate 04/07/17 2059 98     Resp 04/07/17 2059 20     Temp 04/07/17 2059 98.4 F (36.9 C)     Temp Source 04/07/17 2059 Oral     SpO2 04/07/17 2059 100 %     Weight --      Height --      Head Circumference --      Peak Flow --      Pain Score 04/07/17 2057 5     Pain Loc --      Pain Edu? --      Excl. in GC? --    No data found.   Updated Vital Signs BP (!) 148/83 (BP Location: Left Arm)   Pulse 98   Temp 98.4 F (36.9 C) (Oral)   Resp 20   SpO2 100%   Visual Acuity Right Eye Distance:   Left Eye Distance:   Bilateral Distance:  Right Eye Near:   Left Eye Near:    Bilateral Near:     Physical Exam  Constitutional: He appears well-developed and well-nourished.  Eyes: EOM are normal.  Neck: Neck supple.  Cardiovascular: Normal rate, regular rhythm and normal heart sounds.   Pulmonary/Chest: Effort normal and breath sounds normal.  Abdominal: Soft. Bowel sounds are normal. He exhibits no mass. There is no rebound and no guarding.  Most of the abdomen percusses tympanic. Right lower quadrant dull.  Musculoskeletal: Normal range of motion. He exhibits no edema.  Skin: Skin is warm and dry.  Psychiatric: He has a normal mood and affect.  Nursing note and vitals reviewed.    UC Treatments / Results  Labs (all labs ordered are listed, but only abnormal results are displayed) Labs Reviewed - No data to display  EKG  EKG Interpretation None       Radiology No results found.  Procedures Procedures (including critical care time)  Medications Ordered in UC Medications - No data to  display   Initial Impression / Assessment and Plan / UC Course  I have reviewed the triage vital signs and the nursing notes.  Pertinent labs & imaging results that were available during my care of the patient were reviewed by me and considered in my medical decision making (see chart for details).    Likely a viral gastroenteritis. Also sounds like you are having some acid reflux. Take Zofran for nausea. Continue the Zantac, take the Carafate as directed to help coat the stomach. For diarrhea take one Imodium and if having more diarrhea in 3-4 hours she may take another one but do not take enough to stop the diarrhea. If you develop abdominal pain, distention of the abdomen and feel like you are getting worse he may have something other than an infection which would require additional evaluation that may include a scan. If that happens she should go to the emergency department for any worsening.    Final Clinical Impressions(s) / UC Diagnoses   Final diagnoses:  Gastroenteritis  Nausea  Diarrhea in adult patient    New Prescriptions New Prescriptions   ONDANSETRON (ZOFRAN) 4 MG TABLET    Take 1 tablet (4 mg total) by mouth every 6 (six) hours.   SUCRALFATE (CARAFATE) 1 G TABLET    Take 1 tablet (1 g total) by mouth 4 (four) times daily -  with meals and at bedtime.     Controlled Substance Prescriptions [place] Controlled Substance Registry consulted? Not Applicable   Hayden Rasmussen, NP 04/07/17 2233

## 2017-04-07 NOTE — Discharge Instructions (Signed)
Likely a viral gastroenteritis. Also sounds like you are having some acid reflux. Take Zofran for nausea. Continue the Zantac, take the Carafate as directed to help coat the stomach. For diarrhea take one Imodium and if having more diarrhea in 3-4 hours she may take another one but do not take enough to stop the diarrhea. If you develop abdominal pain, distention of the abdomen and feel like you are getting worse he may have something other than an infection which would require additional evaluation that may include a scan. If that happens she should go to the emergency department for any worsening.

## 2018-08-15 IMAGING — CR DG FINGER THUMB 2+V*R*
3 series · 3 of 3 positions shown · non-contrast
Comparison: None.

CLINICAL DATA: Laceration with knife

EXAM:
RIGHT THUMB 2+V

[x finger pa right (1 of 2)]
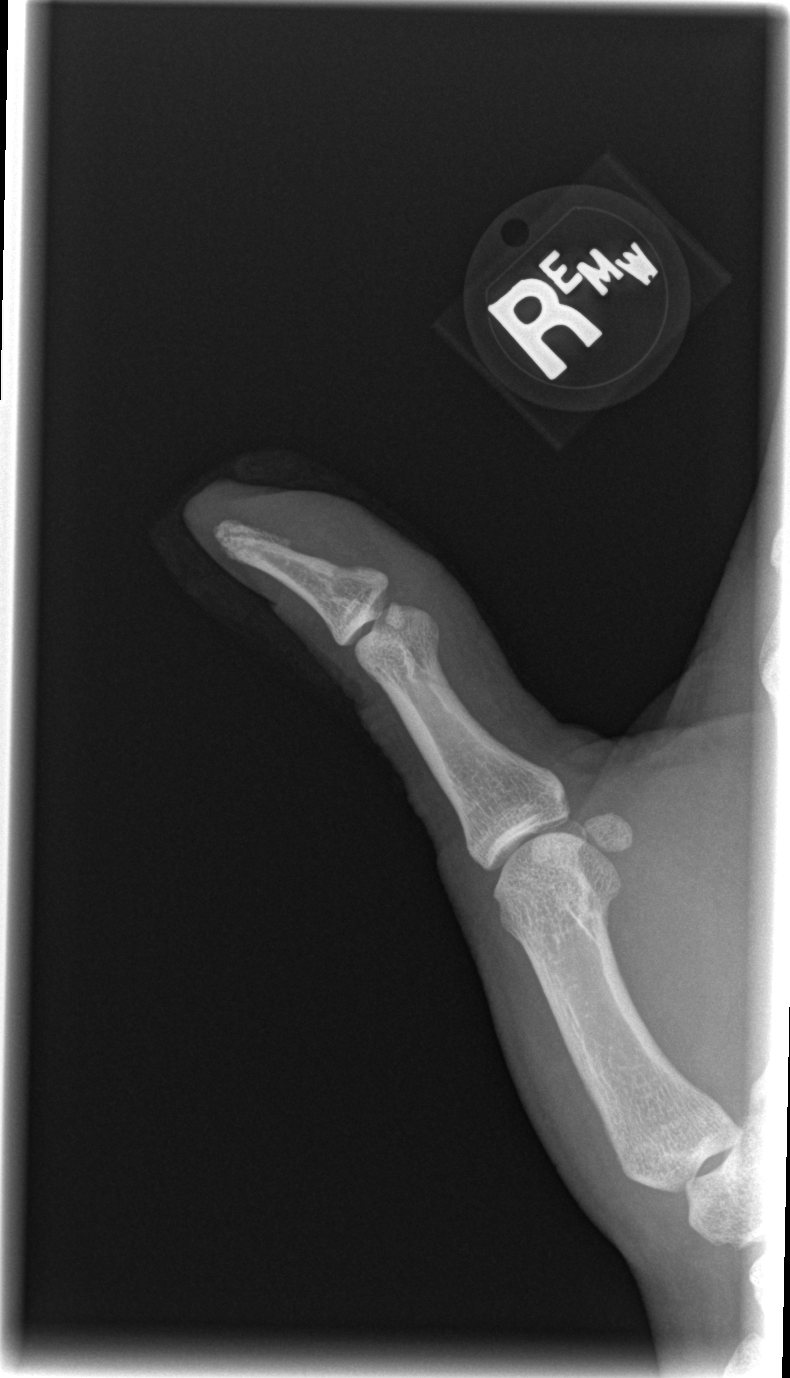

[x finger pa right (2 of 2)]
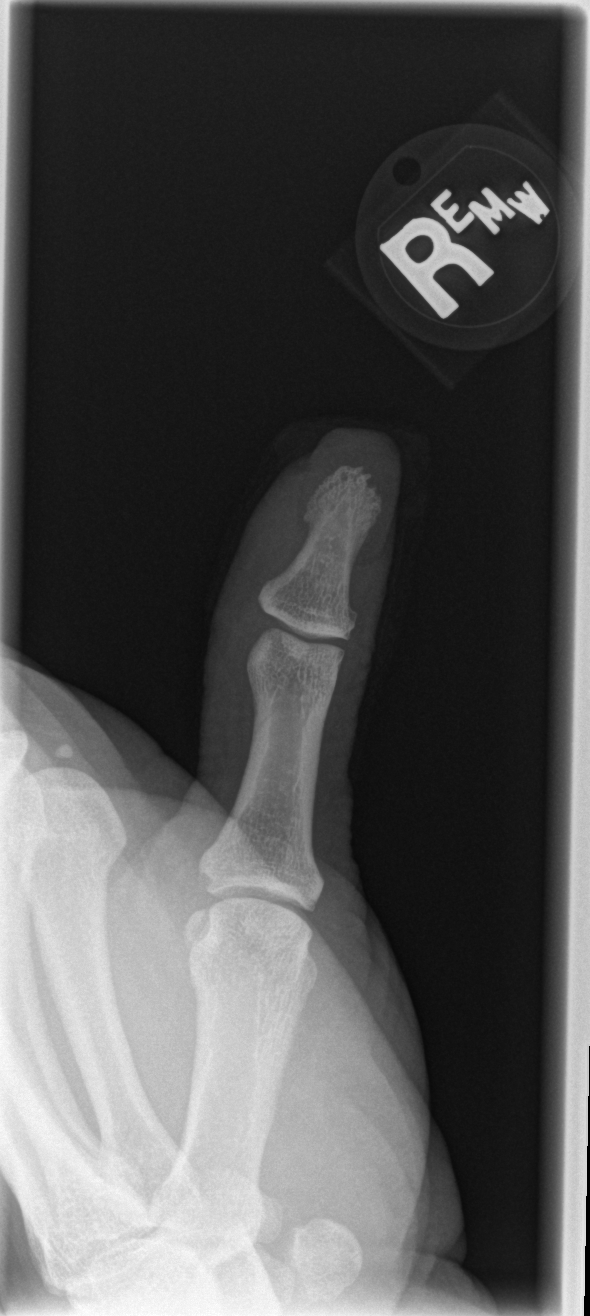

[x finger lateral right]
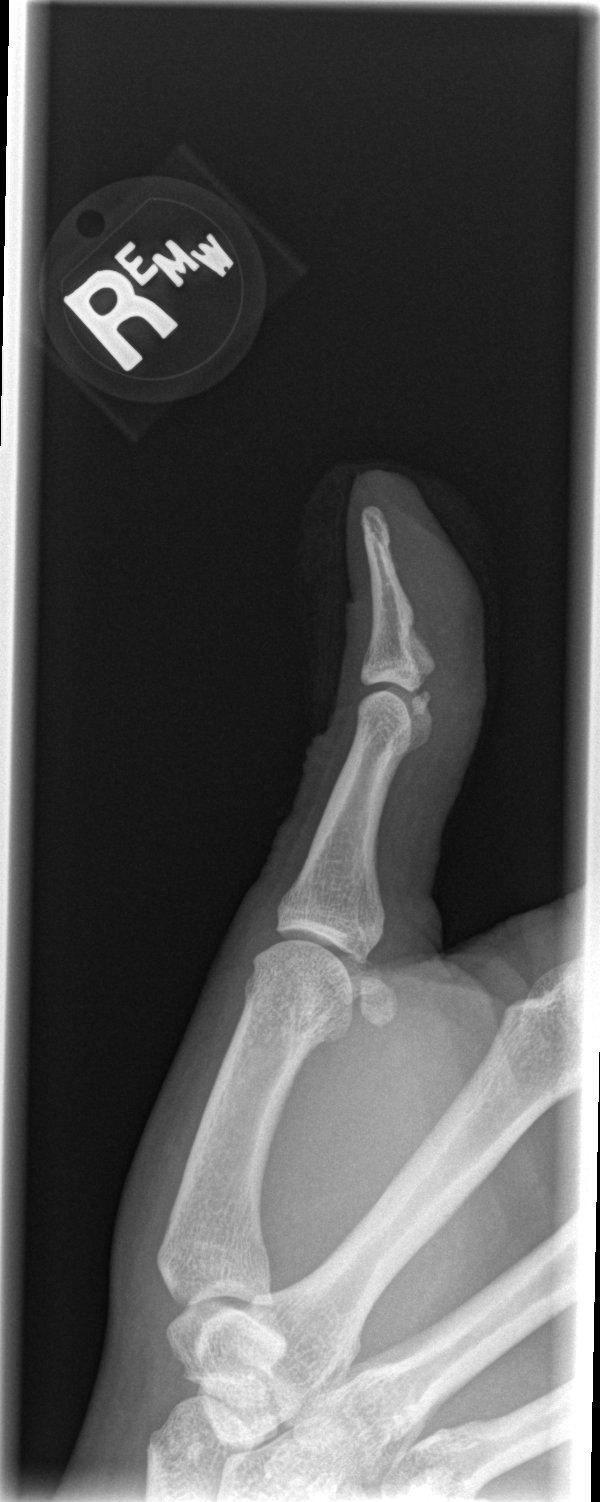

[3 of 3 positions shown; findings below may reference images not displayed]

FINDINGS: Frontal, oblique, and lateral views obtained. There is soft tissue
injury to the distal aspect the first digit with overlying bandage.
No other radiopaque foreign body. No soft tissue air. No fracture or
dislocation. Joint spaces appear unremarkable.
IMPRESSION: Soft tissue injury distally with overlying bandage. No other
radiopaque foreign body beyond the bandage. No soft tissue air. No
fracture or dislocation. No evident arthropathy.

## 2018-10-25 ENCOUNTER — Emergency Department (HOSPITAL_BASED_OUTPATIENT_CLINIC_OR_DEPARTMENT_OTHER)
Admission: EM | Admit: 2018-10-25 | Discharge: 2018-10-25 | Disposition: A | Discharge status: Home / Self Care | Payer: BC Managed Care – PPO | Attending: Emergency Medicine | Admitting: Emergency Medicine

## 2018-10-25 ENCOUNTER — Encounter (HOSPITAL_BASED_OUTPATIENT_CLINIC_OR_DEPARTMENT_OTHER): Payer: Self-pay

## 2018-10-25 ENCOUNTER — Other Ambulatory Visit: Payer: Self-pay

## 2018-10-25 DIAGNOSIS — Y929 Unspecified place or not applicable: Secondary | ICD-10-CM | POA: Insufficient documentation

## 2018-10-25 DIAGNOSIS — X102XXA Contact with fats and cooking oils, initial encounter: Secondary | ICD-10-CM | POA: Insufficient documentation

## 2018-10-25 DIAGNOSIS — Y93G9 Activity, other involving cooking and grilling: Secondary | ICD-10-CM | POA: Insufficient documentation

## 2018-10-25 DIAGNOSIS — T25122A Burn of first degree of left foot, initial encounter: Secondary | ICD-10-CM | POA: Insufficient documentation

## 2018-10-25 DIAGNOSIS — T25022A Burn of unspecified degree of left foot, initial encounter: Secondary | ICD-10-CM

## 2018-10-25 DIAGNOSIS — I10 Essential (primary) hypertension: Secondary | ICD-10-CM | POA: Insufficient documentation

## 2018-10-25 DIAGNOSIS — E119 Type 2 diabetes mellitus without complications: Secondary | ICD-10-CM | POA: Insufficient documentation

## 2018-10-25 DIAGNOSIS — Y999 Unspecified external cause status: Secondary | ICD-10-CM | POA: Insufficient documentation

## 2018-10-25 MED ORDER — CEPHALEXIN 500 MG PO CAPS: 500.0000 mg | ORAL_CAPSULE | Freq: Two times a day (BID) | ORAL | 0 refills | Status: AC

## 2018-10-25 MED ORDER — CEPHALEXIN 250 MG PO CAPS
500.0000 mg | ORAL_CAPSULE | Freq: Once | ORAL | Status: AC
  Administered 2018-10-25: 500 mg via ORAL
  Filled 2018-10-25: qty 2

## 2018-10-25 MED ORDER — SILVER SULFADIAZINE 1 % EX CREA
TOPICAL_CREAM | Freq: Once | CUTANEOUS | Status: AC
  Administered 2018-10-25: 22:00:00 via TOPICAL
  Filled 2018-10-25: qty 85

## 2018-10-25 MED ORDER — SILVER SULFADIAZINE 1 % EX CREA: 1.0000 "application " | TOPICAL_CREAM | Freq: Every day | CUTANEOUS | 0 refills | Status: DC

## 2018-10-25 NOTE — ED Notes (Signed)
ED Provider at bedside. 

## 2018-10-25 NOTE — ED Triage Notes (Signed)
Pt spilled hot grease to left foot- wound not visualized due to wrapped in triage. Pt reports putting ice and neosporin to burn PTA.

## 2018-10-25 NOTE — Discharge Instructions (Signed)
Keep the wound clean with soap and water. Make sure to pat dry the wound before covering it with any dressing. You can apply silvadene as directed.   You can take Tylenol or Ibuprofen as directed for pain. You can alternate Tylenol and Ibuprofen every 4 hours for additional pain relief.   Monitor closely for any signs of infection. Return to the Emergency Department for any worsening redness/swelling of the area that begins to spread, drainage from the site, worsening pain, fever or any other worsening or concerning symptoms.

## 2018-10-25 NOTE — ED Notes (Signed)
Discharge instructions reviewed. Pt instructed to avoid Ibuprofen as he is allergic. All questions answered. Pt verbalizes understanding.

## 2018-10-25 NOTE — ED Provider Notes (Signed)
[facility] EMERGENCY DEPARTMENT Provider Note   CSN: [id number] Arrival date & time: 10/25/18  2056    History   Chief Complaint No chief complaint on file.   HPI Bradley Davis is a 33 y.o. male history of diabetes, hypertension who presents for evaluation of burn to left foot that occurred about 45 minutes prior to ED arrival.  Patient reports that he was cooking and states that a small amount of grease spilled over and landed on his left ankle.  Patient states that he was wearing socks and shoes.  His tetanus is up-to-date.  He denies any numbness/weakness.  He is diabetic and is controlled by insulin.     The history is provided by the patient.    Past Medical History:  Diagnosis Date  . Diabetes mellitus   . Hypertension     There are no active problems to display for this patient.   History reviewed. No pertinent surgical history.      Home Medications    Prior to Admission medications   Medication Sig Start Date End Date Taking? Authorizing Provider  BENAZEPRIL HCL PO Take by mouth.    [provider]  cephALEXin (KEFLEX) 500 MG capsule Take 1 capsule (500 mg total) by mouth 2 (two) times daily for 7 days. 10/25/18 11/01/18  Maxwell CaulLayden, Lindsey A, PA-C  Insulin Aspart (NOVOLOG [place]) Inject into the skin.    [provider]  insulin lispro (HUMALOG) 100 UNIT/ML injection Inject 2 Units into the skin every hour. Diabetic insulin pump    [provider]  ondansetron (ZOFRAN) 4 MG tablet Take 1 tablet (4 mg total) by mouth every 6 (six) hours. 04/07/17   Hayden RasmussenMabe, David, NP  silver sulfADIAZINE (SILVADENE) 1 % cream Apply 1 application topically daily. 10/25/18   Maxwell CaulLayden, Lindsey A, PA-C  sucralfate (CARAFATE) 1 g tablet Take 1 tablet (1 g total) by mouth 4 (four) times daily -  with meals and at bedtime. 04/07/17   Hayden RasmussenMabe, David, NP    Family History No family history on file.  Social History Social History   Tobacco Use  . Smoking  status: Never Smoker  . Smokeless tobacco: Never Used  Substance Use Topics  . Alcohol use: Yes    Comment: socially  . Drug use: No     Allergies   Ibuprofen   Review of Systems Review of Systems  Constitutional: Negative for fever.  Skin: Positive for wound.  Neurological: Negative for weakness and numbness.  All other systems reviewed and are negative.    Physical Exam Updated Vital Signs BP (!) 148/82 (BP Location: Right Arm)   Pulse 99   Resp 17   Ht 5\' 10"  (1.778 m)   Wt 108.9 kg   SpO2 99%   BMI 34.44 kg/m   Physical Exam Vitals signs and nursing note reviewed.  Constitutional:      Appearance: He is well-developed.  HENT:     Head: Normocephalic and atraumatic.  Eyes:     General: No scleral icterus.       Right eye: No discharge.        Left eye: No discharge.     Conjunctiva/sclera: Conjunctivae normal.  Cardiovascular:     Pulses:          Dorsalis pedis pulses are 2+ on the right side and 2+ on the left side.  Pulmonary:     Effort: Pulmonary effort is normal.  Musculoskeletal:     Comments: Flexion/plantarflexion  of ankle intact with any difficulty.  He is able to move all 5 digits of left foot without any difficulty.  Skin:    General: Skin is warm and dry.     Comments: First-degree burn noted to the dorsal aspect of the left foot just proximal to the third, fourth, fifth digit with overlying blister.  3 x 2 cm area of first-degree burn noted to the dorsal aspect of the left foot that extends just right to the ankle joint line.  Neurological:     Mental Status: He is alert.     Comments: Sensation intact along major nerve distributions of BLE  Psychiatric:        Speech: Speech normal.        Behavior: Behavior normal.          ED Treatments / Results  Labs (all labs ordered are listed, but only abnormal results are displayed) Labs Reviewed - No data to display  EKG None  Radiology No results found.  Procedures Procedures  (including critical care time)  Medications Ordered in ED Medications  silver sulfADIAZINE (SILVADENE) 1 % cream ( Topical Given 10/25/18 2141)  cephALEXin (KEFLEX) capsule 500 mg (500 mg Oral Given 10/25/18 2151)     Initial Impression / Assessment and Plan / ED Course  I have reviewed the triage vital signs and the nursing notes.  Pertinent labs & imaging results that were available during my care of the patient were reviewed by me and considered in my medical decision making (see chart for details).        33 yo M with PMH/o Type 1 DM who presents for evaluation of burn to left foot that occurred 45 minutes prior to ED arrival.  Patient reports a small amount of grease fell on his foot while he was cooking.  He reports his tetanus is up-to-date. Patient is afebrile, non-toxic appearing, sitting comfortably on examination table. Vital signs reviewed and stable. Patient is neurovascularly intact.  Exam consistent with first-degree burn of left foot.  Burn is not circumferential.  He has 1 area first-degree burn that is blistered just proximal to the toes as well as another small area with no blister that just extends right to the ankle joint line.  Full range of motion of ankle intact without difficulty.  Will plan to clean wound, apply Silvadene and dressing. Given diabetes plan for antibiotics. Encouraged at home supportive care measures. Patient had ample opportunity for questions and discussion. All patient's questions were answered with full understanding. Strict return precautions discussed. Patient expresses understanding and agreement to plan.   Portions of this note were generated with Scientist, clinical (histocompatibility and immunogenetics). Dictation errors may occur despite best attempts at proofreading.   Final Clinical Impressions(s) / ED Diagnoses   Final diagnoses:  Burn of left foot, unspecified burn degree, initial encounter    ED Discharge Orders         Ordered    silver sulfADIAZINE (SILVADENE) 1  % cream  Daily     10/25/18 2148    cephALEXin (KEFLEX) 500 MG capsule  2 times daily     10/25/18 2148           Rosana Hoes 10/25/18 2322    Charlynne Pander, MD 10/26/18 2149

## 2018-11-03 ENCOUNTER — Other Ambulatory Visit: Payer: Self-pay

## 2018-11-03 ENCOUNTER — Emergency Department (HOSPITAL_BASED_OUTPATIENT_CLINIC_OR_DEPARTMENT_OTHER)
Admission: EM | Admit: 2018-11-03 | Discharge: 2018-11-03 | Disposition: A | Discharge status: Home / Self Care | Payer: Self-pay | Attending: Emergency Medicine | Admitting: Emergency Medicine

## 2018-11-03 ENCOUNTER — Encounter (HOSPITAL_BASED_OUTPATIENT_CLINIC_OR_DEPARTMENT_OTHER): Payer: Self-pay | Admitting: Emergency Medicine

## 2018-11-03 DIAGNOSIS — Y93G3 Activity, cooking and baking: Secondary | ICD-10-CM | POA: Insufficient documentation

## 2018-11-03 DIAGNOSIS — I10 Essential (primary) hypertension: Secondary | ICD-10-CM | POA: Insufficient documentation

## 2018-11-03 DIAGNOSIS — Z79899 Other long term (current) drug therapy: Secondary | ICD-10-CM | POA: Insufficient documentation

## 2018-11-03 DIAGNOSIS — T25022D Burn of unspecified degree of left foot, subsequent encounter: Secondary | ICD-10-CM | POA: Insufficient documentation

## 2018-11-03 DIAGNOSIS — Z5189 Encounter for other specified aftercare: Secondary | ICD-10-CM | POA: Insufficient documentation

## 2018-11-03 DIAGNOSIS — X102XXA Contact with fats and cooking oils, initial encounter: Secondary | ICD-10-CM | POA: Insufficient documentation

## 2018-11-03 DIAGNOSIS — Z794 Long term (current) use of insulin: Secondary | ICD-10-CM | POA: Insufficient documentation

## 2018-11-03 DIAGNOSIS — E119 Type 2 diabetes mellitus without complications: Secondary | ICD-10-CM | POA: Insufficient documentation

## 2018-11-03 MED ORDER — CEPHALEXIN 500 MG PO CAPS: 500.0000 mg | ORAL_CAPSULE | Freq: Two times a day (BID) | ORAL | 0 refills | Status: AC

## 2018-11-03 MED ORDER — CEPHALEXIN 500 MG PO CAPS: 500.0000 mg | ORAL_CAPSULE | Freq: Two times a day (BID) | ORAL | 0 refills | Status: DC

## 2018-11-03 NOTE — ED Provider Notes (Signed)
[facility] EMERGENCY DEPARTMENT Provider Note   CSN: [id number] Arrival date & time: 11/03/18  1103    History   Chief Complaint Chief Complaint  Patient presents with  . Wound Check    HPI Bradley Davis is a 33 y.o. male.     HPI   33 year old male with past medical history of hypertension and diabetes here with wound check.  The patient recently sustained a wound to his left anterior foot.  This was due to a grease burn while cooking.  Works as a [employer] banker, operationalchef.  He was seen in the ER and given prophylactic antibiotics and advised to continue Silvadene twice a day.  He has been taking the medications as prescribed.  He denies any increasing redness, pain, or drainage.  The wound seem to be healing well per his report.  Denies any other significant medical complaints.  However, he states that he began to notice a dried Hipple scab over the left lateral foot wound, so he returns for further evaluation.  His blood sugars have been at his baseline.  Denies any streaking pain or redness of his legs.  No history of previous poor wound healing or diabetic ulcers.  Past Medical History:  Diagnosis Date  . Diabetes mellitus   . Hypertension     There are no active problems to display for this patient.   History reviewed. No pertinent surgical history.      Home Medications    Prior to Admission medications   Medication Sig Start Date End Date Taking? Authorizing Provider  BENAZEPRIL HCL PO Take by mouth.    [provider]  cephALEXin (KEFLEX) 500 MG capsule Take 1 capsule (500 mg total) by mouth 2 (two) times daily for 7 days. 11/03/18 11/10/18  Shaune PollackIsaacs, Harold Mattes, MD  Insulin Aspart (NOVOLOG [place]) Inject into the skin.    [provider]  insulin lispro (HUMALOG) 100 UNIT/ML injection Inject 2 Units into the skin every hour. Diabetic insulin pump    [provider]  ondansetron (ZOFRAN) 4 MG tablet Take 1 tablet (4 mg total) by mouth every 6 (six) hours.  04/07/17   Hayden RasmussenMabe, David, NP  silver sulfADIAZINE (SILVADENE) 1 % cream Apply 1 application topically daily. 10/25/18   Maxwell CaulLayden, Lindsey A, PA-C  sucralfate (CARAFATE) 1 g tablet Take 1 tablet (1 g total) by mouth 4 (four) times daily -  with meals and at bedtime. 04/07/17   Hayden RasmussenMabe, David, NP    Family History No family history on file.  Social History Social History   Tobacco Use  . Smoking status: Never Smoker  . Smokeless tobacco: Never Used  Substance Use Topics  . Alcohol use: Yes    Comment: socially  . Drug use: No     Allergies   Ibuprofen   Review of Systems Review of Systems  Constitutional: Negative for chills and fever.  Respiratory: Negative for shortness of breath.   Cardiovascular: Negative for chest pain.  Musculoskeletal: Negative for neck pain.  Skin: Positive for wound. Negative for rash.  Allergic/Immunologic: Negative for immunocompromised state.  Neurological: Negative for weakness and numbness.  Hematological: Does not bruise/bleed easily.     Physical Exam Updated Vital Signs BP (!) 147/90 (BP Location: Right Arm)   Pulse 85   Temp 98.2 F (36.8 C)   Resp 14   Ht 5\' 10"  (1.778 m)   Wt 108.9 kg   SpO2 100%   BMI 34.44 kg/m   Physical Exam Vitals signs  and nursing note reviewed.  Constitutional:      General: He is not in acute distress.    Appearance: He is well-developed.  HENT:     Head: Normocephalic and atraumatic.  Eyes:     Conjunctiva/sclera: Conjunctivae normal.  Neck:     Musculoskeletal: Neck supple.  Cardiovascular:     Rate and Rhythm: Normal rate and regular rhythm.     Heart sounds: Normal heart sounds.  Pulmonary:     Effort: Pulmonary effort is normal. No respiratory distress.     Breath sounds: No wheezing.  Abdominal:     General: There is no distension.  Skin:    General: Skin is warm.     Capillary Refill: Capillary refill takes less than 2 seconds.     Findings: No rash.  Neurological:     Mental Status: He  is alert and oriented to person, place, and time.     Motor: No abnormal muscle tone.     LOWER EXTREMITY EXAM: RIGHT  INSPECTION & PALPATION: Healing ulcerative superficial wound to left anterior foot, with intact dry eschar. No surrounding TTP, redness, or warmth. No drainage or purulence. No crepitance.  SENSORY: sensation is intact to light touch in:  Superficial peroneal nerve distribution (over dorsum of foot) Deep peroneal nerve distribution (over first dorsal web space) Sural nerve distribution (over lateral aspect 5th metatarsal) Saphenous nerve distribution (over medial instep)  MOTOR:  + Motor EHL (great toe dorsiflexion) + FHL (great toe plantar flexion)  + TA (ankle dorsiflexion)  + GSC (ankle plantar flexion)  VASCULAR: 2+ dorsalis pedis and posterior tibialis pulses Capillary refill < 2 sec, toes warm and well-perfused  COMPARTMENTS: Soft, warm, well-perfused No pain with passive extension No parethesias     ED Treatments / Results  Labs (all labs ordered are listed, but only abnormal results are displayed) Labs Reviewed - No data to display  EKG None  Radiology No results found.  Procedures Procedures (including critical care time)  Medications Ordered in ED Medications - No data to display   Initial Impression / Assessment and Plan / ED Course  I have reviewed the triage vital signs and the nursing notes.  Pertinent labs & imaging results that were available during my care of the patient were reviewed by me and considered in my medical decision making (see chart for details).        33 yo M here with wound check. Burns appear to be healing well with intact, non-infected eschar. There is no evidence of secondary superinfection or deeper wound penetration. Will continue local wound care, d/c with good return precautions and wound care clinic referral given his high risk w/ diabetes. Discussed using a non-stick dressing and liberal  application of silvadene and/or antibiotic ointment to prevent local tissue damage and encourage intact eschar healing.  Final Clinical Impressions(s) / ED Diagnoses   Final diagnoses:  Visit for wound check    ED Discharge Orders         Ordered    cephALEXin (KEFLEX) 500 MG capsule  2 times daily     11/03/18 1201           Shaune Pollack, MD 11/03/18 1205

## 2018-11-03 NOTE — Discharge Instructions (Addendum)
Continue to wash the wound lightly with soap and water twice a day.  After washing, apply a thick layer of Silvadene cream and then wrapped in a nonstick dressing.  Continue to use a loose Kerlix or sports wrap.  Elevate your ankle above the level of your heart as often as possible.  Anytime you are at home, elevated on several pillows.  Decreasing the swelling will help the wounds heal and prevent infection.  I have also extended your antibiotic course for another several days, to help prevent infection given your history of diabetes.  Watch your blood sugars and try to stay on top of them.

## 2018-11-03 NOTE — ED Triage Notes (Signed)
Pt was treated  For left foot wound, on antibiotic, denies new symptoms, here for wound check up .

## 2020-07-09 HISTORY — PX: RETINAL DETACHMENT SURGERY: SHX105

## 2021-04-08 DIAGNOSIS — M726 Necrotizing fasciitis: Secondary | ICD-10-CM

## 2021-04-08 HISTORY — DX: Necrotizing fasciitis: M72.6

## 2021-04-21 ENCOUNTER — Inpatient Hospital Stay (HOSPITAL_BASED_OUTPATIENT_CLINIC_OR_DEPARTMENT_OTHER)
Admission: EM | Admit: 2021-04-21 | Discharge: 2021-05-01 | DRG: 853 | Disposition: A | Discharge status: Home / Self Care | Payer: Self-pay | Attending: Surgery | Admitting: Surgery

## 2021-04-21 ENCOUNTER — Emergency Department (HOSPITAL_BASED_OUTPATIENT_CLINIC_OR_DEPARTMENT_OTHER): Payer: Self-pay

## 2021-04-21 ENCOUNTER — Encounter (HOSPITAL_COMMUNITY): Admission: EM | Disposition: A | Discharge status: Home / Self Care | Payer: Self-pay | Source: Home / Self Care

## 2021-04-21 ENCOUNTER — Other Ambulatory Visit: Payer: Self-pay

## 2021-04-21 ENCOUNTER — Encounter (HOSPITAL_BASED_OUTPATIENT_CLINIC_OR_DEPARTMENT_OTHER): Payer: Self-pay | Admitting: *Deleted

## 2021-04-21 DIAGNOSIS — L02214 Cutaneous abscess of groin: Secondary | ICD-10-CM

## 2021-04-21 DIAGNOSIS — Z6835 Body mass index (BMI) 35.0-35.9, adult: Secondary | ICD-10-CM

## 2021-04-21 DIAGNOSIS — E1029 Type 1 diabetes mellitus with other diabetic kidney complication: Secondary | ICD-10-CM | POA: Diagnosis present

## 2021-04-21 DIAGNOSIS — E869 Volume depletion, unspecified: Secondary | ICD-10-CM | POA: Diagnosis present

## 2021-04-21 DIAGNOSIS — Z79899 Other long term (current) drug therapy: Secondary | ICD-10-CM

## 2021-04-21 DIAGNOSIS — L02415 Cutaneous abscess of right lower limb: Secondary | ICD-10-CM | POA: Diagnosis present

## 2021-04-21 DIAGNOSIS — Z20822 Contact with and (suspected) exposure to covid-19: Secondary | ICD-10-CM | POA: Diagnosis present

## 2021-04-21 DIAGNOSIS — E1022 Type 1 diabetes mellitus with diabetic chronic kidney disease: Secondary | ICD-10-CM | POA: Diagnosis present

## 2021-04-21 DIAGNOSIS — I10 Essential (primary) hypertension: Secondary | ICD-10-CM | POA: Diagnosis present

## 2021-04-21 DIAGNOSIS — Z886 Allergy status to analgesic agent status: Secondary | ICD-10-CM

## 2021-04-21 DIAGNOSIS — E785 Hyperlipidemia, unspecified: Secondary | ICD-10-CM | POA: Diagnosis present

## 2021-04-21 DIAGNOSIS — N179 Acute kidney failure, unspecified: Secondary | ICD-10-CM | POA: Diagnosis present

## 2021-04-21 DIAGNOSIS — A419 Sepsis, unspecified organism: Principal | ICD-10-CM | POA: Diagnosis present

## 2021-04-21 DIAGNOSIS — E669 Obesity, unspecified: Secondary | ICD-10-CM | POA: Diagnosis present

## 2021-04-21 DIAGNOSIS — Z794 Long term (current) use of insulin: Secondary | ICD-10-CM

## 2021-04-21 DIAGNOSIS — E871 Hypo-osmolality and hyponatremia: Secondary | ICD-10-CM | POA: Diagnosis present

## 2021-04-21 DIAGNOSIS — M726 Necrotizing fasciitis: Secondary | ICD-10-CM | POA: Diagnosis present

## 2021-04-21 DIAGNOSIS — R809 Proteinuria, unspecified: Secondary | ICD-10-CM | POA: Diagnosis present

## 2021-04-21 DIAGNOSIS — N1831 Chronic kidney disease, stage 3a: Secondary | ICD-10-CM | POA: Diagnosis present

## 2021-04-21 DIAGNOSIS — E1065 Type 1 diabetes mellitus with hyperglycemia: Secondary | ICD-10-CM | POA: Diagnosis present

## 2021-04-21 DIAGNOSIS — L03115 Cellulitis of right lower limb: Secondary | ICD-10-CM | POA: Diagnosis present

## 2021-04-21 DIAGNOSIS — I129 Hypertensive chronic kidney disease with stage 1 through stage 4 chronic kidney disease, or unspecified chronic kidney disease: Secondary | ICD-10-CM | POA: Diagnosis present

## 2021-04-21 DIAGNOSIS — E876 Hypokalemia: Secondary | ICD-10-CM | POA: Clinically undetermined

## 2021-04-21 DIAGNOSIS — Z9641 Presence of insulin pump (external) (internal): Secondary | ICD-10-CM | POA: Diagnosis present

## 2021-04-21 HISTORY — PX: INCISION AND DRAINAGE ABSCESS: SHX5864

## 2021-04-21 LAB — URINALYSIS, ROUTINE W REFLEX MICROSCOPIC
Bilirubin Urine: NEGATIVE
Glucose, UA: >=500 mg/dL — AB
Ketones, ur: NEGATIVE mg/dL
Leukocytes,Ua: NEGATIVE
Nitrite: NEGATIVE
Protein, ur: >300 mg/dL — AB
Specific Gravity, Urine: 1.01 (ref 1.005–1.030)
pH: 5.5 (ref 5.0–8.0)

## 2021-04-21 LAB — COMPREHENSIVE METABOLIC PANEL
ALT: 59 U/L — ABNORMAL HIGH (ref 0–44)
AST: 44 U/L — ABNORMAL HIGH (ref 15–41)
Albumin: 2.4 g/dL — ABNORMAL LOW (ref 3.5–5.0)
Alkaline Phosphatase: 126 U/L (ref 38–126)
Anion gap: 11 (ref 5–15)
BUN: 31 mg/dL — ABNORMAL HIGH (ref 6–20)
CO2: 22 mmol/L (ref 22–32)
Calcium: 7.9 mg/dL — ABNORMAL LOW (ref 8.9–10.3)
Chloride: 93 mmol/L — ABNORMAL LOW (ref 98–111)
Creatinine, Ser: 2.57 mg/dL — ABNORMAL HIGH (ref 0.61–1.24)
GFR, Estimated: 32 mL/min — ABNORMAL LOW (ref >60)
Glucose, Bld: 513 mg/dL (ref 70–99)
Potassium: 3.5 mmol/L (ref 3.5–5.1)
Sodium: 126 mmol/L — ABNORMAL LOW (ref 135–145)
Total Bilirubin: 0.7 mg/dL (ref 0.3–1.2)
Total Protein: 6.3 g/dL — ABNORMAL LOW (ref 6.5–8.1)

## 2021-04-21 LAB — CBC WITH DIFFERENTIAL/PLATELET
Abs Immature Granulocytes: 0.12 10*3/uL — ABNORMAL HIGH (ref 0.00–0.07)
Basophils Absolute: 0 10*3/uL (ref 0.0–0.1)
Basophils Relative: 0 %
Eosinophils Absolute: 0 10*3/uL (ref 0.0–0.5)
Eosinophils Relative: 0 %
HCT: 32.6 % — ABNORMAL LOW (ref 39.0–52.0)
Hemoglobin: 11.2 g/dL — ABNORMAL LOW (ref 13.0–17.0)
Immature Granulocytes: 1 %
Lymphocytes Relative: 7 %
Lymphs Abs: 1.1 10*3/uL (ref 0.7–4.0)
MCH: 27.9 pg (ref 26.0–34.0)
MCHC: 34.4 g/dL (ref 30.0–36.0)
MCV: 81.3 fL (ref 80.0–100.0)
Monocytes Absolute: 1.1 10*3/uL — ABNORMAL HIGH (ref 0.1–1.0)
Monocytes Relative: 7 %
Neutro Abs: 12.7 10*3/uL — ABNORMAL HIGH (ref 1.7–7.7)
Neutrophils Relative %: 85 %
Platelets: 273 10*3/uL (ref 150–400)
RBC: 4.01 MIL/uL — ABNORMAL LOW (ref 4.22–5.81)
RDW: 13.2 % (ref 11.5–15.5)
Smear Review: NORMAL
WBC: 15 10*3/uL — ABNORMAL HIGH (ref 4.0–10.5)
nRBC: 0 % (ref 0.0–0.2)

## 2021-04-21 LAB — CBG MONITORING, ED
Glucose-Capillary: 328 mg/dL — ABNORMAL HIGH (ref 70–99)
Glucose-Capillary: 235 mg/dL — ABNORMAL HIGH (ref 70–99)
Glucose-Capillary: 196 mg/dL — ABNORMAL HIGH (ref 70–99)

## 2021-04-21 LAB — BASIC METABOLIC PANEL
Anion gap: 10 (ref 5–15)
BUN: 31 mg/dL — ABNORMAL HIGH (ref 6–20)
CO2: 23 mmol/L (ref 22–32)
Calcium: 7.7 mg/dL — ABNORMAL LOW (ref 8.9–10.3)
Chloride: 95 mmol/L — ABNORMAL LOW (ref 98–111)
Creatinine, Ser: 2.55 mg/dL — ABNORMAL HIGH (ref 0.61–1.24)
GFR, Estimated: 33 mL/min — ABNORMAL LOW (ref >60)
Glucose, Bld: 261 mg/dL — ABNORMAL HIGH (ref 70–99)
Potassium: 3 mmol/L — ABNORMAL LOW (ref 3.5–5.1)
Sodium: 128 mmol/L — ABNORMAL LOW (ref 135–145)

## 2021-04-21 LAB — URINALYSIS, MICROSCOPIC (REFLEX)

## 2021-04-21 LAB — LACTIC ACID, PLASMA
Lactic Acid, Venous: 2.6 mmol/L (ref 0.5–1.9)
Lactic Acid, Venous: 2.3 mmol/L (ref 0.5–1.9)

## 2021-04-21 LAB — RESP PANEL BY RT-PCR (FLU A&B, COVID) ARPGX2
Influenza A by PCR: NEGATIVE
Influenza B by PCR: NEGATIVE
SARS Coronavirus 2 by RT PCR: NEGATIVE

## 2021-04-21 LAB — PROTIME-INR
INR: 1.3 — ABNORMAL HIGH (ref 0.8–1.2)
Prothrombin Time: 15.9 seconds — ABNORMAL HIGH (ref 11.4–15.2)

## 2021-04-21 LAB — APTT: aPTT: 38 seconds — ABNORMAL HIGH (ref 24–36)

## 2021-04-21 SURGERY — INCISION AND DRAINAGE, ABSCESS
Anesthesia: General | Site: Thigh | Laterality: Right

## 2021-04-21 MED ORDER — LACTATED RINGERS IV BOLUS
1000.0000 mL | Freq: Once | INTRAVENOUS | Status: AC
  Administered 2021-04-21: 1000 mL via INTRAVENOUS

## 2021-04-21 MED ORDER — ACETAMINOPHEN 325 MG PO TABS
650.0000 mg | ORAL_TABLET | Freq: Once | ORAL | Status: AC
  Administered 2021-04-21: 650 mg via ORAL
  Filled 2021-04-21: qty 2

## 2021-04-21 MED ORDER — INSULIN REGULAR(HUMAN) IN NACL 100-0.9 UT/100ML-% IV SOLN
INTRAVENOUS | Status: DC
  Administered 2021-04-21: 5.5 [IU]/h via INTRAVENOUS
  Filled 2021-04-21: qty 100

## 2021-04-21 MED ORDER — FENTANYL CITRATE (PF) 100 MCG/2ML IJ SOLN
INTRAMUSCULAR | Status: AC
  Filled 2021-04-21: qty 2

## 2021-04-21 MED ORDER — ONDANSETRON HCL 4 MG/2ML IJ SOLN
4.0000 mg | Freq: Once | INTRAMUSCULAR | Status: AC
  Administered 2021-04-21: 4 mg via INTRAVENOUS
  Filled 2021-04-21: qty 2

## 2021-04-21 MED ORDER — PIPERACILLIN-TAZOBACTAM 3.375 G IVPB 30 MIN
3.3750 g | Freq: Once | INTRAVENOUS | Status: AC
  Administered 2021-04-21: 3.375 g via INTRAVENOUS
  Filled 2021-04-21: qty 50

## 2021-04-21 MED ORDER — MORPHINE SULFATE (PF) 4 MG/ML IV SOLN
4.0000 mg | Freq: Once | INTRAVENOUS | Status: AC
  Administered 2021-04-21: 4 mg via INTRAVENOUS
  Filled 2021-04-21: qty 1

## 2021-04-21 MED ORDER — PROPOFOL 10 MG/ML IV BOLUS
INTRAVENOUS | Status: AC
  Filled 2021-04-21: qty 40

## 2021-04-21 MED ORDER — DEXTROSE 50 % IV SOLN: 0.0000 mL | INTRAVENOUS | Status: DC | PRN

## 2021-04-21 MED ORDER — CLINDAMYCIN PHOSPHATE 600 MG/50ML IV SOLN
600.0000 mg | Freq: Once | INTRAVENOUS | Status: AC
  Administered 2021-04-21: 600 mg via INTRAVENOUS
  Filled 2021-04-21: qty 50

## 2021-04-21 MED ORDER — ACETAMINOPHEN 500 MG PO TABS
ORAL_TABLET | ORAL | Status: AC
  Administered 2021-04-21: 1000 mg
  Filled 2021-04-21: qty 2

## 2021-04-21 MED ORDER — MIDAZOLAM HCL 2 MG/2ML IJ SOLN
INTRAMUSCULAR | Status: AC
  Filled 2021-04-21: qty 2

## 2021-04-21 MED ORDER — VANCOMYCIN HCL IN DEXTROSE 1-5 GM/200ML-% IV SOLN
1000.0000 mg | Freq: Once | INTRAVENOUS | Status: AC
  Administered 2021-04-21: 1000 mg via INTRAVENOUS
  Filled 2021-04-21: qty 200

## 2021-04-21 MED ORDER — INSULIN ASPART 100 UNIT/ML IJ SOLN
10.0000 [IU] | Freq: Once | INTRAMUSCULAR | Status: AC
  Administered 2021-04-21: 10 [IU] via SUBCUTANEOUS

## 2021-04-21 MED ORDER — LACTATED RINGERS IV SOLN: INTRAVENOUS | Status: DC

## 2021-04-21 MED ORDER — SODIUM CHLORIDE 0.9 % IV SOLN: 2.0000 g | Freq: Once | INTRAVENOUS | Status: DC

## 2021-04-21 MED ORDER — LIDOCAINE HCL (PF) 2 % IJ SOLN
INTRAMUSCULAR | Status: AC
  Filled 2021-04-21: qty 5

## 2021-04-21 MED ORDER — DEXTROSE IN LACTATED RINGERS 5 % IV SOLN: INTRAVENOUS | Status: DC

## 2021-04-21 SURGICAL SUPPLY — 27 items
BAG COUNTER SPONGE SURGICOUNT (BAG) IMPLANT
BLADE SURG 15 STRL LF DISP TIS (BLADE) ×1 IMPLANT
BLADE SURG 15 STRL SS (BLADE) ×1
BNDG GAUZE ELAST 4 BULKY (GAUZE/BANDAGES/DRESSINGS) ×2 IMPLANT
COVER SURGICAL LIGHT HANDLE (MISCELLANEOUS) ×2 IMPLANT
DECANTER SPIKE VIAL GLASS SM (MISCELLANEOUS) IMPLANT
DRAPE LAPAROSCOPIC ABDOMINAL (DRAPES) IMPLANT
DRSG PAD ABDOMINAL 8X10 ST (GAUZE/BANDAGES/DRESSINGS) ×4 IMPLANT
ELECT REM PT RETURN 15FT ADLT (MISCELLANEOUS) ×2 IMPLANT
GAUZE SPONGE 4X4 12PLY STRL (GAUZE/BANDAGES/DRESSINGS) ×2 IMPLANT
GLOVE SURG ENC MOIS LTX SZ7.5 (GLOVE) ×2 IMPLANT
KIT BASIN OR (CUSTOM PROCEDURE TRAY) ×2 IMPLANT
KIT TURNOVER KIT A (KITS) ×2 IMPLANT
NEEDLE HYPO 22GX1.5 SAFETY (NEEDLE) IMPLANT
NS IRRIG 1000ML POUR BTL (IV SOLUTION) ×2 IMPLANT
PACK BASIC VI WITH GOWN DISP (CUSTOM PROCEDURE TRAY) ×2 IMPLANT
PENCIL SMOKE EVACUATOR (MISCELLANEOUS) IMPLANT
SPONGE T-LAP 18X18 ~~LOC~~+RFID (SPONGE) ×2 IMPLANT
SUT MNCRL AB 4-0 PS2 18 (SUTURE) IMPLANT
SUT VIC AB 3-0 SH 27 (SUTURE)
SUT VIC AB 3-0 SH 27XBRD (SUTURE) IMPLANT
SWAB COLLECTION DEVICE MRSA (MISCELLANEOUS) IMPLANT
SWAB CULTURE ESWAB REG 1ML (MISCELLANEOUS) IMPLANT
SYR 20ML LL LF (SYRINGE) IMPLANT
TOWEL OR 17X26 10 PK STRL BLUE (TOWEL DISPOSABLE) ×2 IMPLANT
TOWEL OR NON WOVEN STRL DISP B (DISPOSABLE) ×2 IMPLANT
YANKAUER SUCT BULB TIP NO VENT (SUCTIONS) ×2 IMPLANT

## 2021-04-21 NOTE — ED Notes (Signed)
Attempted to obtain blood specimen x2RN and EMT. Unsuccessful

## 2021-04-21 NOTE — ED Notes (Signed)
ED Provider at bedside. 

## 2021-04-21 NOTE — H&P (Signed)
CC: Right groin/thigh pain    HPI: Bradley Davis is an 35 y.o. male who is transferred from [facility] after presenting there with severe right thigh and groin pain.  He started having discomfort several days ago.  He was seen at [facility] and placed on clindamycin.  His pain however worsened and he presented for further evaluation.  He was found to have an elevated white blood count, lactic acid level, and a CT scan showing a soft tissue infection in the right medial thigh and groin with air worrisome for possible necrotizing infection.  He reports no drainage from the area.  He is having severe pain in the area especially when trying to walk.  He denies fevers or chills.  He is otherwise without complaints.  He has had no previous similar infections.  He has no known cardiopulmonary problems  Past Medical History:  Diagnosis Date   Diabetes mellitus    Hypertension     History reviewed. No pertinent surgical history.  No family history on file.  Social:  reports that he has never smoked. He has never used smokeless tobacco. He reports that he does not currently use alcohol. He reports that he does not use drugs.  Allergies:  Allergies  Allergen Reactions   Ibuprofen Swelling    Pt reports taking after reported allergy w/ no reactions    Medications: I have reviewed the patient's current medications.  Results for orders placed or performed during the hospital encounter of 04/21/21 (from the past 48 hour(s))  CBC with Differential     Status: Abnormal   Collection Time: 04/21/21  5:48 PM  Result Value Ref Range   WBC 15.0 (H) 4.0 - 10.5 K/uL   RBC 4.01 (L) 4.22 - 5.81 MIL/uL   Hemoglobin 11.2 (L) 13.0 - 17.0 g/dL   HCT 16.1 (L) 09.6 - 04.5 %   MCV 81.3 80.0 - 100.0 fL   MCH 27.9 26.0 - 34.0 pg   MCHC 34.4 30.0 - 36.0 g/dL   RDW 40.9 81.1 - 91.4 %   Platelets 273 150 - 400 K/uL   nRBC 0.0 0.0 - 0.2 %   Neutrophils Relative % 85 %   Neutro Abs 12.7 (H)  1.7 - 7.7 K/uL   Lymphocytes Relative 7 %   Lymphs Abs 1.1 0.7 - 4.0 K/uL   Monocytes Relative 7 %   Monocytes Absolute 1.1 (H) 0.1 - 1.0 K/uL   Eosinophils Relative 0 %   Eosinophils Absolute 0.0 0.0 - 0.5 K/uL   Basophils Relative 0 %   Basophils Absolute 0.0 0.0 - 0.1 K/uL   WBC Morphology TOXIC GRANULATION    RBC Morphology MORPHOLOGY UNREMARKABLE    Smear Review Normal platelet morphology    Immature Granulocytes 1 %   Abs Immature Granulocytes 0.12 (H) 0.00 - 0.07 K/uL    Comment: Performed at [facility], [address] [place], [place] [address]  Comprehensive metabolic panel     Status: Abnormal   Collection Time: 04/21/21  5:48 PM  Result Value Ref Range   Sodium 126 (L) 135 - 145 mmol/L   Potassium 3.5 3.5 - 5.1 mmol/L   Chloride 93 (L) 98 - 111 mmol/L   CO2 22 22 - 32 mmol/L   Glucose, Bld 513 (HH) 70 - 99 mg/dL    Comment: Glucose reference range applies only to samples taken after fasting for at least 8 hours. CRITICAL RESULT CALLED TO,  READ BACK BY AND VERIFIED WITH: April Holding RN AT 1826 ON 04/21/21 BY [email or web address]    BUN 31 (H) 6 - 20 mg/dL   Creatinine, Ser 1.49 (H) 0.61 - 1.24 mg/dL   Calcium 7.9 (L) 8.9 - 10.3 mg/dL   Total Protein 6.3 (L) 6.5 - 8.1 g/dL   Albumin 2.4 (L) 3.5 - 5.0 g/dL   AST 44 (H) 15 - 41 U/L   ALT 59 (H) 0 - 44 U/L   Alkaline Phosphatase 126 38 - 126 U/L   Total Bilirubin 0.7 0.3 - 1.2 mg/dL   GFR, Estimated 32 (L) >60 mL/min    Comment: (NOTE) Calculated using the CKD-EPI Creatinine Equation (2021)    Anion gap 11 5 - 15    Comment: Performed at [facility], [address] [place], [place] [address]  Lactic acid, plasma     Status: Abnormal   Collection Time: 04/21/21  5:48 PM  Result Value Ref Range   Lactic Acid, Venous 2.6 (HH) 0.5 - 1.9 mmol/L    Comment: CRITICAL RESULT CALLED TO, READ BACK BY AND VERIFIED WITH: April Holding RN AT 1828 ON 04/21/21 BY [email or web address] Performed at [facility], [address] [place], [place] [address]   Resp Panel by RT-PCR (Flu A&B, Covid) Nasopharyngeal Swab     Status: None   Collection Time: 04/21/21  6:45 PM   Specimen: Nasopharyngeal Swab; Nasopharyngeal(NP) swabs in vial transport medium  Result Value Ref Range   SARS Coronavirus 2 by RT PCR NEGATIVE NEGATIVE    Comment: (NOTE) SARS-CoV-2 target nucleic acids are NOT DETECTED.  The SARS-CoV-2 RNA is generally detectable in upper respiratory specimens during the acute phase of infection. The lowest concentration of SARS-CoV-2 viral copies this assay can detect is 138 copies/mL. A negative result does not preclude SARS-Cov-2 infection and should not be used as the sole basis for treatment or other patient management decisions. A negative result may occur with  improper specimen collection/handling, submission of specimen other than nasopharyngeal swab, presence of viral mutation(s) within the areas targeted by this assay, and inadequate number of viral copies(<138 copies/mL). A negative result must be combined with clinical observations, patient history, and epidemiological information. The expected result is Negative.  Fact Sheet for Patients:  BloggerCourse.com  Fact Sheet for Healthcare Providers:  SeriousBroker.it  This test is no t yet approved or cleared by the [place] FDA and  has been authorized for detection and/or diagnosis of SARS-CoV-2 by FDA under an Emergency Use Authorization (EUA). This EUA will remain  in effect (meaning this test can be used) for the duration of the COVID-19 declaration under Section 564(b)(1) of the Act, 21 U.S.C.section 360bbb-3(b)(1), unless the authorization is terminated  or revoked sooner.       Influenza A by PCR NEGATIVE NEGATIVE   Influenza B by PCR NEGATIVE NEGATIVE    Comment: (NOTE) The Xpert Xpress SARS-CoV-2/FLU/RSV plus assay is intended as an aid in the diagnosis of  influenza from Nasopharyngeal swab specimens and should not be used as a sole basis for treatment. Nasal washings and aspirates are unacceptable for Xpert Xpress SARS-CoV-2/FLU/RSV testing.  Fact Sheet for Patients: BloggerCourse.com  Fact Sheet for Healthcare Providers: SeriousBroker.it  This test is not yet approved or cleared by the [place] FDA and has been authorized for detection and/or diagnosis of SARS-CoV-2 by FDA under an Emergency Use Authorization (EUA). This EUA will remain in effect (meaning this test can  be used) for the duration of the COVID-19 declaration under Section 564(b)(1) of the Act, 21 U.S.C. section 360bbb-3(b)(1), unless the authorization is terminated or revoked.  Performed at [facility], [address] [place], [place] [address]   Protime-INR     Status: Abnormal   Collection Time: 04/21/21  7:05 PM  Result Value Ref Range   Prothrombin Time 15.9 (H) 11.4 - 15.2 seconds   INR 1.3 (H) 0.8 - 1.2    Comment: (NOTE) INR goal varies based on device and disease states. Performed at [facility], [address] [place], [place] [address]   APTT     Status: Abnormal   Collection Time: 04/21/21  7:05 PM  Result Value Ref Range   aPTT 38 (H) 24 - 36 seconds    Comment:        IF BASELINE aPTT IS ELEVATED, SUGGEST PATIENT RISK ASSESSMENT BE USED TO DETERMINE APPROPRIATE ANTICOAGULANT THERAPY. Performed at [facility], [address] [place], [place] [address]   POC CBG, ED     Status: Abnormal   Collection Time: 04/21/21  8:22 PM  Result Value Ref Range   Glucose-Capillary 328 (H) 70 - 99 mg/dL    Comment: Glucose reference range applies only to samples taken after fasting for at least 8 hours.  Lactic acid, plasma     Status: Abnormal   Collection Time: 04/21/21  8:23 PM  Result Value Ref Range   Lactic Acid, Venous 2.3 (HH) 0.5 - 1.9 mmol/L    Comment:  CRITICAL RESULT CALLED TO, READ BACK BY AND VERIFIED WITH: LISA ADKINS RN AT 2107 ON 04/21/21 BY [email or web address] Performed at [facility], [address] [place], [place] [address]   Basic metabolic panel     Status: Abnormal   Collection Time: 04/21/21  9:35 PM  Result Value Ref Range   Sodium 128 (L) 135 - 145 mmol/L   Potassium 3.0 (L) 3.5 - 5.1 mmol/L   Chloride 95 (L) 98 - 111 mmol/L   CO2 23 22 - 32 mmol/L   Glucose, Bld 261 (H) 70 - 99 mg/dL    Comment: Glucose reference range applies only to samples taken after fasting for at least 8 hours.   BUN 31 (H) 6 - 20 mg/dL   Creatinine, Ser 3.08 (H) 0.61 - 1.24 mg/dL   Calcium 7.7 (L) 8.9 - 10.3 mg/dL   GFR, Estimated 33 (L) >60 mL/min    Comment: (NOTE) Calculated using the CKD-EPI Creatinine Equation (2021)    Anion gap 10 5 - 15    Comment: Performed at [facility], [address] [place], [place] [address]  CBG monitoring, ED     Status: Abnormal   Collection Time: 04/21/21 10:16 PM  Result Value Ref Range   Glucose-Capillary 235 (H) 70 - 99 mg/dL    Comment: Glucose reference range applies only to samples taken after fasting for at least 8 hours.  Urinalysis, Routine w reflex microscopic     Status: Abnormal   Collection Time: 04/21/21 10:25 PM  Result Value Ref Range   Color, Urine YELLOW YELLOW   APPearance CLEAR CLEAR   Specific Gravity, Urine 1.010 1.005 - 1.030   pH 5.5 5.0 - 8.0   Glucose, UA >=500 (A) NEGATIVE mg/dL   Hgb urine dipstick SMALL (A) NEGATIVE   Bilirubin Urine NEGATIVE NEGATIVE   Ketones, ur NEGATIVE NEGATIVE mg/dL  Protein, ur >300 (A) NEGATIVE mg/dL   Nitrite NEGATIVE NEGATIVE   Leukocytes,Ua NEGATIVE NEGATIVE    Comment: Performed at [facility], [address] [place], [place] [address]  Urinalysis, Microscopic (reflex)     Status: Abnormal   Collection Time: 04/21/21 10:25 PM  Result Value Ref Range   RBC / HPF 0-5 0 - 5 RBC/hpf   WBC, UA 0-5 0 - 5 WBC/hpf    Bacteria, UA FEW (A) NONE SEEN   Squamous Epithelial / LPF 0-5 0 - 5   Granular Casts, UA PRESENT     Comment: Performed at [facility], [address] [place], [place] [address]  CBG monitoring, ED     Status: Abnormal   Collection Time: 04/21/21 11:14 PM  Result Value Ref Range   Glucose-Capillary 196 (H) 70 - 99 mg/dL    Comment: Glucose reference range applies only to samples taken after fasting for at least 8 hours.    CT PELVIS WO CONTRAST  Result Date: 04/21/2021 CLINICAL DATA:  Groin pain for several days with palpable abnormality in inflammation EXAM: CT PELVIS WITHOUT CONTRAST TECHNIQUE: Multidetector CT imaging of the pelvis was performed following the standard protocol without intravenous contrast. COMPARISON:  None. FINDINGS: Urinary Tract:  Bladder is well distended. Bowel: The appendix is well visualized and within normal limits. Visualized colon and small bowel are within normal limits. Vascular/Lymphatic: Vascular calcifications are noted. No aneurysmal dilatation is seen. Reactive lymph nodes are noted in the inguinal regions bilaterally right slightly greater than left. Reproductive:  Prostate is within normal limits. Other:  No free fluid is noted within the pelvis. Musculoskeletal: No acute bony abnormality is noted. In the medial right groin and proximal right thigh there is an area of inflammation with air within consistent with focal abscess. Only minimal fluid component is noted. Surrounding musculature appears within normal limits. Proximal IMPRESSION: Subcutaneous abscess in the medial aspect of the right groin and proximal right thigh containing mostly air. No large fluid collection is identified. Reactive adenopathy is identified. No involvement of the scrotum is identified. Electronically Signed   By: Alcide Clever M.D.   On: 04/21/2021 19:48   DG Chest Port 1 View  Result Date: 04/21/2021 CLINICAL DATA:  Questionable sepsis EXAM: PORTABLE CHEST 1 VIEW  COMPARISON:  08/03/2009 FINDINGS: Cardiac and mediastinal contours are within normal limits. No focal pulmonary opacity. No pleural effusion or pneumothorax. No acute osseous abnormality. IMPRESSION: No acute cardiopulmonary process. Electronically Signed   By: Wiliam Ke M.D.   On: 04/21/2021 19:39    ROS - all of the below systems have been reviewed with the patient and positives are indicated with bold text General: chills, fever or night sweats Eyes: blurry vision or double vision ENT: epistaxis or sore throat Allergy/Immunology: itchy/watery eyes or nasal congestion Hematologic/Lymphatic: bleeding problems, blood clots or swollen lymph nodes Endocrine: temperature intolerance or unexpected weight changes Breast: new or changing breast lumps or nipple discharge Resp: cough, shortness of breath, or wheezing CV: chest pain or dyspnea on exertion GI: as per HPI GU: dysuria, trouble voiding, or hematuria MSK: joint pain or joint stiffness Neuro: TIA or stroke symptoms Derm: pruritus and skin lesion changes Psych: anxiety and depression  PE Blood pressure (!) 171/91, pulse (!) 104, temperature 99.4 F (37.4 C), temperature source Oral, resp. rate 18, height 5\' 10"  (1.778 m), weight 111.6 kg, SpO2 100 %. Constitutional: NAD; conversant; no deformities Eyes: Moist conjunctiva; no lid  lag; anicteric; PERRL Neck: Trachea midline; no thyromegaly Lungs: Normal respiratory effort; no tactile fremitus CV: RRR; no palpable thrills; no pitting edema GI: Abd soft and nontender; no palpable hepatosplenomegaly MSK: Severe pain with motion at the right medial thigh.  Normal range of motion of his other extremities; no clubbing/cyanosis Skin: There is darkening of the skin and induration in the right medial thigh near the groin.  His scrotum is nontender. Psychiatric: Appropriate affect; alert and oriented x3 Lymphatic: No palpable cervical or axillary lymphadenopathy  Results for orders placed  or performed during the hospital encounter of 04/21/21 (from the past 48 hour(s))  CBC with Differential     Status: Abnormal   Collection Time: 04/21/21  5:48 PM  Result Value Ref Range   WBC 15.0 (H) 4.0 - 10.5 K/uL   RBC 4.01 (L) 4.22 - 5.81 MIL/uL   Hemoglobin 11.2 (L) 13.0 - 17.0 g/dL   HCT 47.8 (L) 29.5 - 62.1 %   MCV 81.3 80.0 - 100.0 fL   MCH 27.9 26.0 - 34.0 pg   MCHC 34.4 30.0 - 36.0 g/dL   RDW 30.8 65.7 - 84.6 %   Platelets 273 150 - 400 K/uL   nRBC 0.0 0.0 - 0.2 %   Neutrophils Relative % 85 %   Neutro Abs 12.7 (H) 1.7 - 7.7 K/uL   Lymphocytes Relative 7 %   Lymphs Abs 1.1 0.7 - 4.0 K/uL   Monocytes Relative 7 %   Monocytes Absolute 1.1 (H) 0.1 - 1.0 K/uL   Eosinophils Relative 0 %   Eosinophils Absolute 0.0 0.0 - 0.5 K/uL   Basophils Relative 0 %   Basophils Absolute 0.0 0.0 - 0.1 K/uL   WBC Morphology TOXIC GRANULATION    RBC Morphology MORPHOLOGY UNREMARKABLE    Smear Review Normal platelet morphology    Immature Granulocytes 1 %   Abs Immature Granulocytes 0.12 (H) 0.00 - 0.07 K/uL    Comment: Performed at [facility], [address] [place], [place] [address]  Comprehensive metabolic panel     Status: Abnormal   Collection Time: 04/21/21  5:48 PM  Result Value Ref Range   Sodium 126 (L) 135 - 145 mmol/L   Potassium 3.5 3.5 - 5.1 mmol/L   Chloride 93 (L) 98 - 111 mmol/L   CO2 22 22 - 32 mmol/L   Glucose, Bld 513 (HH) 70 - 99 mg/dL    Comment: Glucose reference range applies only to samples taken after fasting for at least 8 hours. CRITICAL RESULT CALLED TO, READ BACK BY AND VERIFIED WITH: BAILEY JAMIE RN AT 1826 ON 04/21/21 BY [email or web address]    BUN 31 (H) 6 - 20 mg/dL   Creatinine, Ser 2.84 (H) 0.61 - 1.24 mg/dL   Calcium 7.9 (L) 8.9 - 10.3 mg/dL   Total Protein 6.3 (L) 6.5 - 8.1 g/dL   Albumin 2.4 (L) 3.5 - 5.0 g/dL   AST 44 (H) 15 - 41 U/L   ALT 59 (H) 0 - 44 U/L   Alkaline Phosphatase 126 38 - 126 U/L   Total Bilirubin 0.7 0.3 - 1.2 mg/dL    GFR, Estimated 32 (L) >60 mL/min    Comment: (NOTE) Calculated using the CKD-EPI Creatinine Equation (2021)    Anion gap 11 5 - 15    Comment: Performed at [facility], [address] [place], [place] [address]  Lactic acid, plasma     Status: Abnormal   Collection Time:  04/21/21  5:48 PM  Result Value Ref Range   Lactic Acid, Venous 2.6 (HH) 0.5 - 1.9 mmol/L    Comment: CRITICAL RESULT CALLED TO, READ BACK BY AND VERIFIED WITH: April Holding RN AT 1828 ON 04/21/21 BY [email or web address] Performed at [facility], [address] [place], [place] [address]   Resp Panel by RT-PCR (Flu A&B, Covid) Nasopharyngeal Swab     Status: None   Collection Time: 04/21/21  6:45 PM   Specimen: Nasopharyngeal Swab; Nasopharyngeal(NP) swabs in vial transport medium  Result Value Ref Range   SARS Coronavirus 2 by RT PCR NEGATIVE NEGATIVE    Comment: (NOTE) SARS-CoV-2 target nucleic acids are NOT DETECTED.  The SARS-CoV-2 RNA is generally detectable in upper respiratory specimens during the acute phase of infection. The lowest concentration of SARS-CoV-2 viral copies this assay can detect is 138 copies/mL. A negative result does not preclude SARS-Cov-2 infection and should not be used as the sole basis for treatment or other patient management decisions. A negative result may occur with  improper specimen collection/handling, submission of specimen other than nasopharyngeal swab, presence of viral mutation(s) within the areas targeted by this assay, and inadequate number of viral copies(<138 copies/mL). A negative result must be combined with clinical observations, patient history, and epidemiological information. The expected result is Negative.  Fact Sheet for Patients:  BloggerCourse.com  Fact Sheet for Healthcare Providers:  SeriousBroker.it  This test is no t yet approved or cleared by the [place] FDA and  has been  authorized for detection and/or diagnosis of SARS-CoV-2 by FDA under an Emergency Use Authorization (EUA). This EUA will remain  in effect (meaning this test can be used) for the duration of the COVID-19 declaration under Section 564(b)(1) of the Act, 21 U.S.C.section 360bbb-3(b)(1), unless the authorization is terminated  or revoked sooner.       Influenza A by PCR NEGATIVE NEGATIVE   Influenza B by PCR NEGATIVE NEGATIVE    Comment: (NOTE) The Xpert Xpress SARS-CoV-2/FLU/RSV plus assay is intended as an aid in the diagnosis of influenza from Nasopharyngeal swab specimens and should not be used as a sole basis for treatment. Nasal washings and aspirates are unacceptable for Xpert Xpress SARS-CoV-2/FLU/RSV testing.  Fact Sheet for Patients: BloggerCourse.com  Fact Sheet for Healthcare Providers: SeriousBroker.it  This test is not yet approved or cleared by the [place] FDA and has been authorized for detection and/or diagnosis of SARS-CoV-2 by FDA under an Emergency Use Authorization (EUA). This EUA will remain in effect (meaning this test can be used) for the duration of the COVID-19 declaration under Section 564(b)(1) of the Act, 21 U.S.C. section 360bbb-3(b)(1), unless the authorization is terminated or revoked.  Performed at [facility], [address] [place], [place] [address]   Protime-INR     Status: Abnormal   Collection Time: 04/21/21  7:05 PM  Result Value Ref Range   Prothrombin Time 15.9 (H) 11.4 - 15.2 seconds   INR 1.3 (H) 0.8 - 1.2    Comment: (NOTE) INR goal varies based on device and disease states. Performed at [facility], [address] [place], [place] [address]   APTT     Status: Abnormal   Collection Time: 04/21/21  7:05 PM  Result Value Ref Range   aPTT 38 (H) 24 - 36 seconds    Comment:        IF BASELINE aPTT IS ELEVATED, SUGGEST PATIENT RISK ASSESSMENT BE  USED  TO DETERMINE APPROPRIATE ANTICOAGULANT THERAPY. Performed at [facility], [address] [place], [place] [address]   POC CBG, ED     Status: Abnormal   Collection Time: 04/21/21  8:22 PM  Result Value Ref Range   Glucose-Capillary 328 (H) 70 - 99 mg/dL    Comment: Glucose reference range applies only to samples taken after fasting for at least 8 hours.  Lactic acid, plasma     Status: Abnormal   Collection Time: 04/21/21  8:23 PM  Result Value Ref Range   Lactic Acid, Venous 2.3 (HH) 0.5 - 1.9 mmol/L    Comment: CRITICAL RESULT CALLED TO, READ BACK BY AND VERIFIED WITH: LISA ADKINS RN AT 2107 ON 04/21/21 BY [email or web address] Performed at [facility], [address] [place], [place] [address]   Basic metabolic panel     Status: Abnormal   Collection Time: 04/21/21  9:35 PM  Result Value Ref Range   Sodium 128 (L) 135 - 145 mmol/L   Potassium 3.0 (L) 3.5 - 5.1 mmol/L   Chloride 95 (L) 98 - 111 mmol/L   CO2 23 22 - 32 mmol/L   Glucose, Bld 261 (H) 70 - 99 mg/dL    Comment: Glucose reference range applies only to samples taken after fasting for at least 8 hours.   BUN 31 (H) 6 - 20 mg/dL   Creatinine, Ser 4.66 (H) 0.61 - 1.24 mg/dL   Calcium 7.7 (L) 8.9 - 10.3 mg/dL   GFR, Estimated 33 (L) >60 mL/min    Comment: (NOTE) Calculated using the CKD-EPI Creatinine Equation (2021)    Anion gap 10 5 - 15    Comment: Performed at [facility], [address] [place], [place] [address]  CBG monitoring, ED     Status: Abnormal   Collection Time: 04/21/21 10:16 PM  Result Value Ref Range   Glucose-Capillary 235 (H) 70 - 99 mg/dL    Comment: Glucose reference range applies only to samples taken after fasting for at least 8 hours.  Urinalysis, Routine w reflex microscopic     Status: Abnormal   Collection Time: 04/21/21 10:25 PM  Result Value Ref Range   Color, Urine YELLOW YELLOW   APPearance CLEAR CLEAR   Specific Gravity, Urine 1.010 1.005 - 1.030   pH 5.5  5.0 - 8.0   Glucose, UA >=500 (A) NEGATIVE mg/dL   Hgb urine dipstick SMALL (A) NEGATIVE   Bilirubin Urine NEGATIVE NEGATIVE   Ketones, ur NEGATIVE NEGATIVE mg/dL   Protein, ur >701 (A) NEGATIVE mg/dL   Nitrite NEGATIVE NEGATIVE   Leukocytes,Ua NEGATIVE NEGATIVE    Comment: Performed at [facility], [address] [place], [place] [address]  Urinalysis, Microscopic (reflex)     Status: Abnormal   Collection Time: 04/21/21 10:25 PM  Result Value Ref Range   RBC / HPF 0-5 0 - 5 RBC/hpf   WBC, UA 0-5 0 - 5 WBC/hpf   Bacteria, UA FEW (A) NONE SEEN   Squamous Epithelial / LPF 0-5 0 - 5   Granular Casts, UA PRESENT     Comment: Performed at [facility], [address] [place], [place] [address]  CBG monitoring, ED     Status: Abnormal   Collection Time: 04/21/21 11:14 PM  Result Value Ref Range   Glucose-Capillary 196 (H) 70 - 99 mg/dL    Comment: Glucose reference range applies only to samples  taken after fasting for at least 8 hours.    CT PELVIS WO CONTRAST  Result Date: 04/21/2021 CLINICAL DATA:  Groin pain for several days with palpable abnormality in inflammation EXAM: CT PELVIS WITHOUT CONTRAST TECHNIQUE: Multidetector CT imaging of the pelvis was performed following the standard protocol without intravenous contrast. COMPARISON:  None. FINDINGS: Urinary Tract:  Bladder is well distended. Bowel: The appendix is well visualized and within normal limits. Visualized colon and small bowel are within normal limits. Vascular/Lymphatic: Vascular calcifications are noted. No aneurysmal dilatation is seen. Reactive lymph nodes are noted in the inguinal regions bilaterally right slightly greater than left. Reproductive:  Prostate is within normal limits. Other:  No free fluid is noted within the pelvis. Musculoskeletal: No acute bony abnormality is noted. In the medial right groin and proximal right thigh there is an area of inflammation with air within consistent with  focal abscess. Only minimal fluid component is noted. Surrounding musculature appears within normal limits. Proximal IMPRESSION: Subcutaneous abscess in the medial aspect of the right groin and proximal right thigh containing mostly air. No large fluid collection is identified. Reactive adenopathy is identified. No involvement of the scrotum is identified. Electronically Signed   By: Alcide Clever M.D.   On: 04/21/2021 19:48   DG Chest Port 1 View  Result Date: 04/21/2021 CLINICAL DATA:  Questionable sepsis EXAM: PORTABLE CHEST 1 VIEW COMPARISON:  08/03/2009 FINDINGS: Cardiac and mediastinal contours are within normal limits. No focal pulmonary opacity. No pleural effusion or pneumothorax. No acute osseous abnormality. IMPRESSION: No acute cardiopulmonary process. Electronically Signed   By: Wiliam Ke M.D.   On: 04/21/2021 19:39     A/P: Right thigh/groin infection  Given the CT scan, his white blood count, lactic acid level, this is worrisome for possible necrotizing infection.  I discussed this with him in detail.  I recommend proceeding to the operating room urgently for an incision, drainage, and possible debridement of the area.  I explained the procedure in detail.  This may result in a large open wound depending on what needs to be debrided.  It may require multiple trips to the operating room.  We discussed the risks of bleeding, infection, injury to surrounding structures, again the need for further procedures, cardiopulmonary issues, etc. His CBG had been as high as 500 but is now down with insulin to below 200.  We will have the triad hospitalist consult on him for recommendations regarding his diabetes postoperatively.  He understands and agrees to proceed emergently to the operating room.  Abigail Miyamoto, MD [facility] Use [email or web address] to contact on call provider

## 2021-04-21 NOTE — ED Provider Notes (Addendum)
I personally evaluated the patient during the encounter and completed a history, physical, procedures, medical decision making to contribute to the overall care of the patient and decision making for the patient briefly, the patient is a 35 y.o. male with right groin pain, fever.  History of diabetes.  Currently on clindamycin for right groin infection.  Has gotten significantly worse and more painful.  He is very tender in the right upper groin area and down into his right mid thigh.  There is no obvious crepitus.  Concern for deep space infection including necrotizing fasciitis.  Does not appear to involve the scrotum.  Patient is febrile and tachycardic upon my evaluation.  He has already had blood work that shows elevated white count so sepsis protocol initiated.  Broad-spectrum IV antibiotics started.  Lactic acid was 2.6.  Given 2 L of IV fluids.  Blood sugar elevated but patient not in DKA.  Given 10 units of insulin but ultimately started on insulin drip.  CT scan concerning for necrotizing process as patient has subcutaneous abscess in the medial aspect of the groin and proximal right thigh containing mostly air.  However there is no large fluid collection identified.  No scrotum involvement.  Overall talked with Dr. Sheliah Hatch who was able to get in touch with Dr. Magnus Ivan at [facility] and will transfer patient ED to ED for surgery evaluation with Dr. Magnus Ivan.  Dr. Silverio Lay was excepting.  Dr. Magnus Ivan would like hospitalist consultation for elevated blood sugars.  He is currently on insulin drip.  This chart was dictated using voice recognition software.  Despite best efforts to proofread,  errors can occur which can change the documentation meaning.    EKG Interpretation  Date/Time:  Friday April 21 2021 19:43:05 EDT Ventricular Rate:  105 PR Interval:  144 QRS Duration: 85 QT Interval:  316 QTC Calculation: 418 R Axis:   48 Text Interpretation: Sinus tachycardia Borderline T  abnormalities, diffuse leads Confirmed by Virgina Norfolk ([id number]) on 04/21/2021 9:26:52 PM        .Critical Care Performed by: Virgina Norfolk, DO Authorized by: Virgina Norfolk, DO   Critical care provider statement:    Critical care time (minutes):  65   Critical care time was exclusive of:  Separately billable procedures and treating other patients   Critical care was necessary to treat or prevent imminent or life-threatening deterioration of the following conditions:  Sepsis   Critical care was time spent personally by me on the following activities:  Blood draw for specimens, development of treatment plan with patient or surrogate, discussions with primary provider, evaluation of patient's response to treatment, examination of patient, obtaining history from patient or surrogate, ordering and performing treatments and interventions, ordering and review of laboratory studies, ordering and review of radiographic studies, pulse oximetry, re-evaluation of patient's condition and review of old charts   I assumed direction of critical care for this patient from another provider in my specialty: no     Care discussed with: admitting provider       Virgina Norfolk, DO 04/21/21 2127    Virgina Norfolk, DO 04/21/21 2136

## 2021-04-21 NOTE — Sepsis Progress Note (Signed)
Code sepsis protocol being followed by elink

## 2021-04-21 NOTE — ED Notes (Signed)
Patient with pain, inflammation, warmth to right groin extending to thigh.

## 2021-04-21 NOTE — ED Provider Notes (Signed)
[facility] EMERGENCY DEPARTMENT Provider Note   CSN: [id number] Arrival date & time: 04/21/21  1440     History Chief Complaint  Patient presents with   Groin Pain    Bradley Davis is a 35 y.o. male with history of type 1 diabetes presents to the emergency department for evaluation of right groin pain/abscess gradually worsening for the past 5 days.  Patient reports the pain is throbbing and sharp and constant.  Patient mentions he went to [facility] medical for evaluation and they put him on clindamycin.  Despite antibiotics, he continues to worsen.  Pain worsens with palpation and movement.  He denies any dysuria, hematuria, diarrhea, constipation, fevers, penile pain, penile discharge, testicular pain, testicular swelling, or abdominal pain.  Patient denies any fevers, but mentions he has had some chills over the past few days.  Reports he is compliant with all medications. Medical history includes type 1 diabetes, hypertension, hyperlipidemia.  None pertinent surgical history.  Patient reports his allergy history includes ibuprofen, but he has had ibuprofen several times in the past with no reaction.  He denies any tobacco, EtOH, illicit drug, or IV drug use ever.   Groin Pain Pertinent negatives include no chest pain, no abdominal pain and no shortness of breath.      Past Medical History:  Diagnosis Date   Diabetes mellitus    Hypertension     There are no problems to display for this patient.   History reviewed. No pertinent surgical history.     No family history on file.  Social History   Tobacco Use   Smoking status: Never   Smokeless tobacco: Never  Vaping Use   Vaping Use: Never used  Substance Use Topics   Alcohol use: Not Currently    Comment: socially   Drug use: No    Home Medications Prior to Admission medications   Medication Sig Start Date End Date Taking? Authorizing Provider  BENAZEPRIL HCL PO Take by mouth.    [provider]  Insulin Aspart (NOVOLOG [place]) Inject into the skin.    [provider]  insulin lispro (HUMALOG) 100 UNIT/ML injection Inject 2 Units into the skin every hour. Diabetic insulin pump    [provider]  ondansetron (ZOFRAN) 4 MG tablet Take 1 tablet (4 mg total) by mouth every 6 (six) hours. 04/07/17   Hayden Rasmussen, NP  silver sulfADIAZINE (SILVADENE) 1 % cream Apply 1 application topically daily. 10/25/18   Maxwell Caul, PA-C  sucralfate (CARAFATE) 1 g tablet Take 1 tablet (1 g total) by mouth 4 (four) times daily -  with meals and at bedtime. 04/07/17   Hayden Rasmussen, NP    Allergies    Ibuprofen  Review of Systems   Review of Systems  Constitutional:  Positive for chills. Negative for fever.  HENT:  Negative for ear pain and sore throat.   Eyes:  Negative for pain and visual disturbance.  Respiratory:  Negative for cough and shortness of breath.   Cardiovascular:  Negative for chest pain and palpitations.  Gastrointestinal:  Negative for abdominal pain and vomiting.  Endocrine: Negative for polydipsia, polyphagia and polyuria.  Genitourinary:  Negative for difficulty urinating, dysuria, frequency, hematuria, penile discharge, penile pain, penile swelling, scrotal swelling, testicular pain and urgency.  Musculoskeletal:  Positive for myalgias. Negative for arthralgias and back pain.  Skin:  Positive for wound. Negative for color change and rash.  Neurological:  Negative for dizziness, seizures, syncope, weakness and  numbness.  All other systems reviewed and are negative.  Physical Exam Updated Vital Signs BP 131/72   Pulse (!) 102   Temp 99.4 F (37.4 C) (Oral)   Resp (!) 25   Ht 5\' 10"  (1.778 m)   Wt 111.6 kg   SpO2 98%   BMI 35.30 kg/m   Physical Exam Vitals and nursing note reviewed.  Constitutional:      General: He is not in acute distress.    Appearance: Normal appearance. He is not ill-appearing or toxic-appearing.     Comments: Patient appears  uncomfortable.  HENT:     Head: Normocephalic and atraumatic.  Eyes:     General: No scleral icterus. Cardiovascular:     Rate and Rhythm: Regular rhythm. Tachycardia present.  Pulmonary:     Effort: Pulmonary effort is normal. No respiratory distress.     Breath sounds: Normal breath sounds. No wheezing.  Abdominal:     General: Abdomen is flat. Bowel sounds are normal.     Palpations: Abdomen is soft.     Tenderness: There is no abdominal tenderness. There is no guarding or rebound.  Genitourinary:    Penis: Normal.      Testes: Normal.     Comments: Large area of induration to the medial right thigh into the inguinal area.  No fluctuance noted.  No central area of purulence.  Darker overlying skin changes.  No rashes, erythema, or ecchymosis noted to the area.  No perennial involvement.  Patient is exquisitely tender to palpation.  No penile discharge, penile pain, negative Prehn sign.  No testicular swelling or scrotal swelling.  No surrounding rashes or lesions. Musculoskeletal:        General: No deformity.     Cervical back: Normal range of motion.  Skin:    General: Skin is warm and dry.  Neurological:     General: No focal deficit present.     Mental Status: He is alert. Mental status is at baseline.    ED Results / Procedures / Treatments   Labs (all labs ordered are listed, but only abnormal results are displayed) Labs Reviewed  CBC WITH DIFFERENTIAL/PLATELET - Abnormal; Notable for the following components:      Result Value   WBC 15.0 (*)    RBC 4.01 (*)    Hemoglobin 11.2 (*)    HCT 32.6 (*)    Neutro Abs 12.7 (*)    Monocytes Absolute 1.1 (*)    Abs Immature Granulocytes 0.12 (*)    All other components within normal limits  COMPREHENSIVE METABOLIC PANEL - Abnormal; Notable for the following components:   Sodium 126 (*)    Chloride 93 (*)    Glucose, Bld 513 (*)    BUN 31 (*)    Creatinine, Ser 2.57 (*)    Calcium 7.9 (*)    Total Protein 6.3 (*)     Albumin 2.4 (*)    AST 44 (*)    ALT 59 (*)    GFR, Estimated 32 (*)    All other components within normal limits  LACTIC ACID, PLASMA - Abnormal; Notable for the following components:   Lactic Acid, Venous 2.6 (*)    All other components within normal limits  LACTIC ACID, PLASMA - Abnormal; Notable for the following components:   Lactic Acid, Venous 2.3 (*)    All other components within normal limits  PROTIME-INR - Abnormal; Notable for the following components:   Prothrombin Time 15.9 (*)  INR 1.3 (*)    All other components within normal limits  APTT - Abnormal; Notable for the following components:   aPTT 38 (*)    All other components within normal limits  CBG MONITORING, ED - Abnormal; Notable for the following components:   Glucose-Capillary 328 (*)    All other components within normal limits  RESP PANEL BY RT-PCR (FLU A&B, COVID) ARPGX2  CULTURE, BLOOD (ROUTINE X 2)  CULTURE, BLOOD (ROUTINE X 2)  URINE CULTURE  URINALYSIS, ROUTINE W REFLEX MICROSCOPIC  BASIC METABOLIC PANEL  CBG MONITORING, ED    EKG EKG Interpretation  Date/Time:  Friday April 21 2021 19:43:05 EDT Ventricular Rate:  105 PR Interval:  144 QRS Duration: 85 QT Interval:  316 QTC Calculation: 418 R Axis:   48 Text Interpretation: Sinus tachycardia Borderline T abnormalities, diffuse leads Confirmed by Virgina Norfolk ([id number]) on 04/21/2021 9:26:52 PM  Radiology CT PELVIS WO CONTRAST  Result Date: 04/21/2021 CLINICAL DATA:  Groin pain for several days with palpable abnormality in inflammation EXAM: CT PELVIS WITHOUT CONTRAST TECHNIQUE: Multidetector CT imaging of the pelvis was performed following the standard protocol without intravenous contrast. COMPARISON:  None. FINDINGS: Urinary Tract:  Bladder is well distended. Bowel: The appendix is well visualized and within normal limits. Visualized colon and small bowel are within normal limits. Vascular/Lymphatic: Vascular calcifications are noted. No  aneurysmal dilatation is seen. Reactive lymph nodes are noted in the inguinal regions bilaterally right slightly greater than left. Reproductive:  Prostate is within normal limits. Other:  No free fluid is noted within the pelvis. Musculoskeletal: No acute bony abnormality is noted. In the medial right groin and proximal right thigh there is an area of inflammation with air within consistent with focal abscess. Only minimal fluid component is noted. Surrounding musculature appears within normal limits. Proximal IMPRESSION: Subcutaneous abscess in the medial aspect of the right groin and proximal right thigh containing mostly air. No large fluid collection is identified. Reactive adenopathy is identified. No involvement of the scrotum is identified. Electronically Signed   By: Alcide Clever M.D.   On: 04/21/2021 19:48   DG Chest Port 1 View  Result Date: 04/21/2021 CLINICAL DATA:  Questionable sepsis EXAM: PORTABLE CHEST 1 VIEW COMPARISON:  08/03/2009 FINDINGS: Cardiac and mediastinal contours are within normal limits. No focal pulmonary opacity. No pleural effusion or pneumothorax. No acute osseous abnormality. IMPRESSION: No acute cardiopulmonary process. Electronically Signed   By: Wiliam Ke M.D.   On: 04/21/2021 19:39    Procedures .Critical Care Performed by: Achille Rich, PA-C Authorized by: Achille Rich, PA-C   Critical care provider statement:    Critical care time (minutes):  35   Critical care was necessary to treat or prevent imminent or life-threatening deterioration of the following conditions:  Sepsis   Critical care was time spent personally by me on the following activities:  Blood draw for specimens, development of treatment plan with patient or surrogate, evaluation of patient's response to treatment, examination of patient, re-evaluation of patient's condition, pulse oximetry, ordering and review of radiographic studies, ordering and review of laboratory studies and ordering and  performing treatments and interventions   Care discussed with: admitting provider     Medications Ordered in ED Medications  insulin regular, human (MYXREDLIN) 100 units/ 100 mL infusion (has no administration in time range)  lactated ringers infusion (has no administration in time range)  dextrose 5 % in lactated ringers infusion (has no administration in time range)  dextrose 50 %  solution 0-50 mL (has no administration in time range)  acetaminophen (TYLENOL) tablet 650 mg (650 mg Oral Given 04/21/21 1511)  morphine 4 MG/ML injection 4 mg (4 mg Intravenous Given 04/21/21 1801)  ondansetron (ZOFRAN) injection 4 mg (4 mg Intravenous Given 04/21/21 1800)  lactated ringers bolus 1,000 mL (0 mLs Intravenous Stopped 04/21/21 1947)  clindamycin (CLEOCIN) IVPB 600 mg (0 mg Intravenous Stopped 04/21/21 1908)  insulin aspart (novoLOG) injection 10 Units (10 Units Subcutaneous Given 04/21/21 1841)  lactated ringers bolus 1,000 mL (0 mLs Intravenous Stopped 04/21/21 2007)  vancomycin (VANCOCIN) IVPB 1000 mg/200 mL premix (0 mg Intravenous Stopped 04/21/21 2124)  piperacillin-tazobactam (ZOSYN) IVPB 3.375 g (0 g Intravenous Stopped 04/21/21 2010)  morphine 4 MG/ML injection 4 mg (4 mg Intravenous Given 04/21/21 2015)    ED Course  I have reviewed the triage vital signs and the nursing notes.  Pertinent labs & imaging results that were available during my care of the patient were reviewed by me and considered in my medical decision making (see chart for details).  Derrek DAINE GUNTHER presents to the emergency department for evaluation of right medial thigh abscess.  Differential diagnosis includes but not limited to abscess, folliculitis, necrotizing fasciitis, neoplasm.  I personally reviewed and interpreted the patient's labs and imaging.  The patient is an elevated lactic at 2.3.  Elevated APTT at 38.  PT/INR 15.9/1.3 respectively.  Respiratory panel negative for COVID and flu.  Patient CMP shows  pseudohyponatremia at 126.  Patient is hyperglycemic at 513.  Corrected sodium level should be between 133 and 136.  Additionally, patient showing an AKI with creatinine of 2.57.  Elevated liver enzymes.  CBC shows mild anemia 11.2 and 32.6 respectively.  Leukocytosis at 15.  Blood cultures, urinalysis, urine culture ordered.    CT pelvis shows subcutaneous abscess in the medial aspect of the right groin and proximal right thigh containing mostly air. No large fluid collection is identified. Reactive adenopathy is identified. No involvement of the scrotum is identified.  Chest x-ray shows no acute cardiopulmonary process.  Given lab and imaging findings, sepsis order set given.  Originally patient was given clindamycin, but then later mentioned he was given clindamycin outpatient.  During his stay, he has received Tylenol for fever along with 2 L of lactated Ringer's, 10 units of insulin, 8 mg of morphine, and 4 mg of Zofran.  Because of the failure of outpatient treatment with clinda, broad-spectrum antibiotics were started including Zosyn and Vancomycin. Pharmacy consulted for dosing.  With the given fluids and insulin, patient glucose went from 513 to 328.  My attending consulted general surgery for evaluation of possible necrotizing fasciitis.  Surgery recommends transfer ED to ED for admission and medicine consult for hyperglycemia. Medicine will need to be consulted once patient arrives to ED. Attending to admit patient.  I discussed this case with my attending physician who cosigned this note including patient's presenting symptoms, physical exam, and planned diagnostics and interventions. Attending physician stated agreement with plan or made changes to plan which were implemented.   Attending physician assessed patient at bedside.     MDM Rules/Calculators/A&P  Final Clinical Impression(s) / ED Diagnoses Final diagnoses:  Sepsis, due to unspecified organism, unspecified whether acute organ  dysfunction present [facility])  Abscess of groin  Necrotizing fasciitis [facility])    Rx / DC Orders ED Discharge Orders     None        Achille Rich, PA-C 04/21/21 2206    Virgina Norfolk,  DO 04/21/21 2255

## 2021-04-21 NOTE — Anesthesia Preprocedure Evaluation (Addendum)
Anesthesia Evaluation  Patient identified by MRN, date of birth, ID band Patient awake    Reviewed: Allergy & Precautions, NPO status , Patient's Chart, lab work & pertinent test results  History of Anesthesia Complications Negative for: history of anesthetic complications  Airway Mallampati: II  TM Distance: >3 FB Neck ROM: Full    Dental  (+) Teeth Intact, Dental Advisory Given   Pulmonary  04/21/2021 SARS coronavirus NEG   breath sounds clear to auscultation       Cardiovascular hypertension, Pt. on medications (-) angina Rhythm:Regular Rate:Normal     Neuro/Psych negative neurological ROS     GI/Hepatic Neg liver ROS, GERD  Controlled,Elevated LFTs   Endo/Other  diabetes (glu 143), Insulin DependentMorbid obesity  Renal/GU Renal InsufficiencyRenal disease     Musculoskeletal   Abdominal (+) + obese,   Peds  Hematology negative hematology ROS (+)   Anesthesia Other Findings   Reproductive/Obstetrics                            Anesthesia Physical Anesthesia Plan  ASA: 3 and emergent  Anesthesia Plan: General   Post-op Pain Management:    Induction: Intravenous and Rapid sequence  PONV Risk Score and Plan: 2 and Ondansetron and Dexamethasone  Airway Management Planned: Oral ETT  Additional Equipment: None  Intra-op Plan:   Post-operative Plan: Extubation in OR  Informed Consent: I have reviewed the patients History and Physical, chart, labs and discussed the procedure including the risks, benefits and alternatives for the proposed anesthesia with the patient or authorized representative who has indicated his/her understanding and acceptance.     Dental advisory given  Plan Discussed with: CRNA and Surgeon  Anesthesia Plan Comments:        Anesthesia Quick Evaluation

## 2021-04-21 NOTE — ED Triage Notes (Signed)
Groin pain x 5 days. He may have an abscess. Site has a knot and is inflamed per pt.

## 2021-04-21 NOTE — ED Notes (Signed)
Unable to obtain blood at triage.

## 2021-04-22 ENCOUNTER — Inpatient Hospital Stay (HOSPITAL_COMMUNITY): Payer: Self-pay

## 2021-04-22 ENCOUNTER — Emergency Department (HOSPITAL_COMMUNITY): Payer: Self-pay | Admitting: Certified Registered Nurse Anesthetist

## 2021-04-22 ENCOUNTER — Encounter (HOSPITAL_COMMUNITY): Payer: Self-pay | Admitting: Surgery

## 2021-04-22 DIAGNOSIS — L02415 Cutaneous abscess of right lower limb: Secondary | ICD-10-CM | POA: Diagnosis present

## 2021-04-22 DIAGNOSIS — M726 Necrotizing fasciitis: Secondary | ICD-10-CM | POA: Diagnosis present

## 2021-04-22 DIAGNOSIS — N179 Acute kidney failure, unspecified: Secondary | ICD-10-CM

## 2021-04-22 DIAGNOSIS — L03115 Cellulitis of right lower limb: Secondary | ICD-10-CM | POA: Diagnosis present

## 2021-04-22 LAB — CBC
HCT: 32.4 % — ABNORMAL LOW (ref 39.0–52.0)
Hemoglobin: 10.9 g/dL — ABNORMAL LOW (ref 13.0–17.0)
MCH: 27.9 pg (ref 26.0–34.0)
MCHC: 33.6 g/dL (ref 30.0–36.0)
MCV: 82.9 fL (ref 80.0–100.0)
Platelets: 312 K/uL (ref 150–400)
RBC: 3.91 MIL/uL — ABNORMAL LOW (ref 4.22–5.81)
RDW: 13.7 % (ref 11.5–15.5)
WBC: 15.9 K/uL — ABNORMAL HIGH (ref 4.0–10.5)
nRBC: 0 % (ref 0.0–0.2)

## 2021-04-22 LAB — BASIC METABOLIC PANEL WITH GFR
Anion gap: 10 (ref 5–15)
BUN: 34 mg/dL — ABNORMAL HIGH (ref 6–20)
CO2: 22 mmol/L (ref 22–32)
Calcium: 7.8 mg/dL — ABNORMAL LOW (ref 8.9–10.3)
Chloride: 97 mmol/L — ABNORMAL LOW (ref 98–111)
Creatinine, Ser: 2.59 mg/dL — ABNORMAL HIGH (ref 0.61–1.24)
GFR, Estimated: 32 mL/min — ABNORMAL LOW
Glucose, Bld: 217 mg/dL — ABNORMAL HIGH (ref 70–99)
Potassium: 3.6 mmol/L (ref 3.5–5.1)
Sodium: 129 mmol/L — ABNORMAL LOW (ref 135–145)

## 2021-04-22 LAB — GLUCOSE, CAPILLARY
Glucose-Capillary: 157 mg/dL — ABNORMAL HIGH (ref 70–99)
Glucose-Capillary: 158 mg/dL — ABNORMAL HIGH (ref 70–99)
Glucose-Capillary: 218 mg/dL — ABNORMAL HIGH (ref 70–99)
Glucose-Capillary: 224 mg/dL — ABNORMAL HIGH (ref 70–99)
Glucose-Capillary: 245 mg/dL — ABNORMAL HIGH (ref 70–99)
Glucose-Capillary: 248 mg/dL — ABNORMAL HIGH (ref 70–99)
Glucose-Capillary: 282 mg/dL — ABNORMAL HIGH (ref 70–99)

## 2021-04-22 LAB — HEMOGLOBIN A1C
Hgb A1c MFr Bld: 14.2 % — ABNORMAL HIGH (ref 4.8–5.6)
Mean Plasma Glucose: 360.84 mg/dL

## 2021-04-22 MED ORDER — OXYCODONE HCL 5 MG PO TABS
5.0000 mg | ORAL_TABLET | Freq: Once | ORAL | Status: DC | PRN
Start: 2021-04-22 — End: 2021-04-22

## 2021-04-22 MED ORDER — FENTANYL CITRATE (PF) 100 MCG/2ML IJ SOLN
INTRAMUSCULAR | Status: DC | PRN
  Administered 2021-04-22: 100 ug via INTRAVENOUS
  Administered 2021-04-22 (×2): 50 ug via INTRAVENOUS

## 2021-04-22 MED ORDER — HYDROMORPHONE HCL 1 MG/ML IJ SOLN
0.2500 mg | INTRAMUSCULAR | Status: DC | PRN
  Administered 2021-04-22: 0.5 mg via INTRAVENOUS

## 2021-04-22 MED ORDER — GABAPENTIN 300 MG PO CAPS
300.0000 mg | ORAL_CAPSULE | Freq: Two times a day (BID) | ORAL | Status: DC
  Administered 2021-04-22 – 2021-05-01 (×20): 300 mg via ORAL
  Filled 2021-04-22 (×20): qty 1

## 2021-04-22 MED ORDER — METHOCARBAMOL 500 MG PO TABS
500.0000 mg | ORAL_TABLET | Freq: Four times a day (QID) | ORAL | Status: DC | PRN
  Administered 2021-04-22 – 2021-04-27 (×9): 500 mg via ORAL
  Filled 2021-04-22 (×9): qty 1

## 2021-04-22 MED ORDER — BENAZEPRIL HCL 20 MG PO TABS: 20.0000 mg | ORAL_TABLET | Freq: Every day | ORAL | Status: DC

## 2021-04-22 MED ORDER — LIDOCAINE HCL (PF) 2 % IJ SOLN
INTRAMUSCULAR | Status: AC
  Filled 2021-04-22: qty 5

## 2021-04-22 MED ORDER — SODIUM CHLORIDE (PF) 0.9 % IJ SOLN
INTRAMUSCULAR | Status: AC
  Filled 2021-04-22: qty 10

## 2021-04-22 MED ORDER — DIPHENHYDRAMINE HCL 50 MG/ML IJ SOLN: 25.0000 mg | Freq: Four times a day (QID) | INTRAMUSCULAR | Status: DC | PRN

## 2021-04-22 MED ORDER — INSULIN ASPART 100 UNIT/ML IJ SOLN
0.0000 [IU] | INTRAMUSCULAR | Status: DC
  Administered 2021-04-22 (×3): 7 [IU] via SUBCUTANEOUS
  Administered 2021-04-22: 11 [IU] via SUBCUTANEOUS
  Administered 2021-04-22 – 2021-04-23 (×3): 7 [IU] via SUBCUTANEOUS
  Administered 2021-04-23: 11 [IU] via SUBCUTANEOUS
  Administered 2021-04-23: 4 [IU] via SUBCUTANEOUS
  Administered 2021-04-24: 11 [IU] via SUBCUTANEOUS
  Administered 2021-04-24: 7 [IU] via SUBCUTANEOUS
  Administered 2021-04-24: 4 [IU] via SUBCUTANEOUS

## 2021-04-22 MED ORDER — OXYCODONE HCL 5 MG PO TABS
5.0000 mg | ORAL_TABLET | ORAL | Status: DC | PRN
  Administered 2021-04-22 – 2021-04-30 (×28): 10 mg via ORAL
  Filled 2021-04-22 (×29): qty 2

## 2021-04-22 MED ORDER — HYDRALAZINE HCL 20 MG/ML IJ SOLN
10.0000 mg | Freq: Three times a day (TID) | INTRAMUSCULAR | Status: DC | PRN
  Administered 2021-04-23: 10 mg via INTRAVENOUS
  Filled 2021-04-22: qty 1

## 2021-04-22 MED ORDER — BUPIVACAINE-EPINEPHRINE (PF) 0.5% -1:200000 IJ SOLN
INTRAMUSCULAR | Status: DC | PRN
  Administered 2021-04-22: 40 mL

## 2021-04-22 MED ORDER — INSULIN DETEMIR 100 UNIT/ML ~~LOC~~ SOLN
22.0000 [IU] | Freq: Two times a day (BID) | SUBCUTANEOUS | Status: DC
  Administered 2021-04-22 – 2021-04-29 (×14): 22 [IU] via SUBCUTANEOUS
  Filled 2021-04-22 (×15): qty 0.22

## 2021-04-22 MED ORDER — MIDAZOLAM HCL 2 MG/2ML IJ SOLN: 0.5000 mg | Freq: Once | INTRAMUSCULAR | Status: DC | PRN

## 2021-04-22 MED ORDER — LACTATED RINGERS IV SOLN: INTRAVENOUS | Status: DC | PRN

## 2021-04-22 MED ORDER — VANCOMYCIN HCL IN DEXTROSE 1-5 GM/200ML-% IV SOLN: 1000.0000 mg | INTRAVENOUS | Status: DC

## 2021-04-22 MED ORDER — TRAMADOL HCL 50 MG PO TABS
50.0000 mg | ORAL_TABLET | Freq: Four times a day (QID) | ORAL | Status: DC | PRN
Start: 2021-04-22 — End: 2021-05-01
  Administered 2021-04-22 – 2021-04-28 (×4): 50 mg via ORAL
  Filled 2021-04-22 (×4): qty 1

## 2021-04-22 MED ORDER — 0.9 % SODIUM CHLORIDE (POUR BTL) OPTIME
TOPICAL | Status: DC | PRN
  Administered 2021-04-22: 1000 mL

## 2021-04-22 MED ORDER — BUPIVACAINE-EPINEPHRINE 0.5% -1:200000 IJ SOLN
INTRAMUSCULAR | Status: AC
  Filled 2021-04-22: qty 1

## 2021-04-22 MED ORDER — ONDANSETRON HCL 4 MG/2ML IJ SOLN
INTRAMUSCULAR | Status: DC | PRN
  Administered 2021-04-22: 4 mg via INTRAVENOUS

## 2021-04-22 MED ORDER — FENTANYL CITRATE (PF) 100 MCG/2ML IJ SOLN
INTRAMUSCULAR | Status: AC
  Filled 2021-04-22: qty 2

## 2021-04-22 MED ORDER — ONDANSETRON HCL 4 MG/2ML IJ SOLN
INTRAMUSCULAR | Status: AC
  Filled 2021-04-22: qty 2

## 2021-04-22 MED ORDER — DIPHENHYDRAMINE HCL 25 MG PO CAPS: 25.0000 mg | ORAL_CAPSULE | Freq: Four times a day (QID) | ORAL | Status: DC | PRN

## 2021-04-22 MED ORDER — ONDANSETRON HCL 4 MG/2ML IJ SOLN
4.0000 mg | Freq: Four times a day (QID) | INTRAMUSCULAR | Status: DC | PRN
  Administered 2021-04-25: 4 mg via INTRAVENOUS
  Filled 2021-04-22: qty 2

## 2021-04-22 MED ORDER — LIDOCAINE HCL (CARDIAC) PF 100 MG/5ML IV SOSY
PREFILLED_SYRINGE | INTRAVENOUS | Status: DC | PRN
  Administered 2021-04-22: 20 mg via INTRAVENOUS

## 2021-04-22 MED ORDER — VANCOMYCIN HCL IN DEXTROSE 1-5 GM/200ML-% IV SOLN
1000.0000 mg | INTRAVENOUS | Status: DC
  Administered 2021-04-22 – 2021-04-23 (×2): 1000 mg via INTRAVENOUS
  Filled 2021-04-22 (×2): qty 200

## 2021-04-22 MED ORDER — ONDANSETRON 4 MG PO TBDP: 4.0000 mg | ORAL_TABLET | Freq: Four times a day (QID) | ORAL | Status: DC | PRN

## 2021-04-22 MED ORDER — ACETAMINOPHEN 325 MG PO TABS
650.0000 mg | ORAL_TABLET | Freq: Four times a day (QID) | ORAL | Status: DC | PRN
  Administered 2021-04-22: 650 mg via ORAL
  Filled 2021-04-22: qty 2

## 2021-04-22 MED ORDER — HYDROMORPHONE HCL 1 MG/ML IJ SOLN
INTRAMUSCULAR | Status: AC
  Filled 2021-04-22: qty 1

## 2021-04-22 MED ORDER — MIDAZOLAM HCL 2 MG/2ML IJ SOLN
INTRAMUSCULAR | Status: DC | PRN
  Administered 2021-04-22: 2 mg via INTRAVENOUS

## 2021-04-22 MED ORDER — MEPERIDINE HCL 50 MG/ML IJ SOLN: 6.2500 mg | INTRAMUSCULAR | Status: DC | PRN

## 2021-04-22 MED ORDER — SUCRALFATE 1 G PO TABS
1.0000 g | ORAL_TABLET | Freq: Three times a day (TID) | ORAL | Status: DC
  Administered 2021-04-22 – 2021-05-01 (×33): 1 g via ORAL
  Filled 2021-04-22 (×34): qty 1

## 2021-04-22 MED ORDER — PIPERACILLIN-TAZOBACTAM 3.375 G IVPB
3.3750 g | Freq: Three times a day (TID) | INTRAVENOUS | Status: DC
  Administered 2021-04-22 – 2021-04-23 (×4): 3.375 g via INTRAVENOUS
  Filled 2021-04-22 (×5): qty 50

## 2021-04-22 MED ORDER — PROPOFOL 10 MG/ML IV BOLUS
INTRAVENOUS | Status: DC | PRN
  Administered 2021-04-22: 200 mg via INTRAVENOUS

## 2021-04-22 MED ORDER — SODIUM CHLORIDE 0.9 % IV SOLN
INTRAVENOUS | Status: DC | PRN
  Administered 2021-04-22: 500 mL via INTRAVENOUS
  Administered 2021-04-22: 250 mL via INTRAVENOUS
  Administered 2021-04-25: 1000 mL via INTRAVENOUS

## 2021-04-22 MED ORDER — SUCCINYLCHOLINE CHLORIDE 200 MG/10ML IV SOSY
PREFILLED_SYRINGE | INTRAVENOUS | Status: DC | PRN
  Administered 2021-04-22: 200 mg via INTRAVENOUS

## 2021-04-22 MED ORDER — SODIUM CHLORIDE 0.9 % IV BOLUS
1000.0000 mL | Freq: Once | INTRAVENOUS | Status: AC
  Administered 2021-04-22: 1000 mL via INTRAVENOUS

## 2021-04-22 MED ORDER — POTASSIUM CHLORIDE IN NACL 20-0.9 MEQ/L-% IV SOLN
INTRAVENOUS | Status: DC
  Filled 2021-04-22 (×4): qty 1000

## 2021-04-22 MED ORDER — HYDROMORPHONE HCL 1 MG/ML IJ SOLN
1.0000 mg | INTRAMUSCULAR | Status: DC | PRN
  Administered 2021-04-24 – 2021-04-25 (×4): 1 mg via INTRAVENOUS
  Filled 2021-04-22 (×4): qty 1

## 2021-04-22 MED ORDER — ENOXAPARIN SODIUM 40 MG/0.4ML IJ SOSY
40.0000 mg | PREFILLED_SYRINGE | INTRAMUSCULAR | Status: DC
  Administered 2021-04-24 – 2021-05-01 (×7): 40 mg via SUBCUTANEOUS
  Filled 2021-04-22 (×7): qty 0.4

## 2021-04-22 MED ORDER — PROMETHAZINE HCL 25 MG/ML IJ SOLN: 6.2500 mg | INTRAMUSCULAR | Status: DC | PRN

## 2021-04-22 MED ORDER — OXYCODONE HCL 5 MG/5ML PO SOLN: 5.0000 mg | Freq: Once | ORAL | Status: DC | PRN

## 2021-04-22 MED ORDER — SUCCINYLCHOLINE CHLORIDE 200 MG/10ML IV SOSY
PREFILLED_SYRINGE | INTRAVENOUS | Status: AC
  Filled 2021-04-22: qty 10

## 2021-04-22 NOTE — Progress Notes (Signed)
Subjective/Chief Complaint: Postop check  He reports feeling much better and having less pain   Objective: Vital signs in last 24 hours: Temp:  [98.6 F (37 C)-101 F (38.3 C)] 98.6 F (37 C) (10/15 0440) Pulse Rate:  [92-110] 95 (10/15 0440) Resp:  [13-25] 16 (10/15 0440) BP: (127-171)/(67-91) 128/72 (10/15 0440) SpO2:  [93 %-100 %] 98 % (10/15 0440) Weight:  [111.6 kg] 111.6 kg (10/14 1504) Last BM Date: 04/21/21  Intake/Output from previous day: 10/14 0701 - 10/15 0700 In: 3669.8 [P.O.:320; I.V.:1025.5; IV Piggyback:2324.3] Out: 615 [Urine:600; Blood:15] Intake/Output this shift: No intake/output data recorded.  Exam: Awake and alert Less tachy Wound stable right groin.thigh.  surrounding skin soft  Lab Results:  Recent Labs    04/21/21 1748 04/22/21 0658  WBC 15.0* 15.9*  HGB 11.2* 10.9*  HCT 32.6* 32.4*  PLT 273 312   BMET Recent Labs    04/21/21 2135 04/22/21 0658  NA 128* 129*  K 3.0* 3.6  CL 95* 97*  CO2 23 22  GLUCOSE 261* 217*  BUN 31* 34*  CREATININE 2.55* 2.59*  CALCIUM 7.7* 7.8*   PT/INR Recent Labs    04/21/21 1905  LABPROT 15.9*  INR 1.3*   ABG No results for input(s): PHART, HCO3 in the last 72 hours.  Invalid input(s): PCO2, PO2  Studies/Results: CT PELVIS WO CONTRAST  Result Date: 04/21/2021 CLINICAL DATA:  Groin pain for several days with palpable abnormality in inflammation EXAM: CT PELVIS WITHOUT CONTRAST TECHNIQUE: Multidetector CT imaging of the pelvis was performed following the standard protocol without intravenous contrast. COMPARISON:  None. FINDINGS: Urinary Tract:  Bladder is well distended. Bowel: The appendix is well visualized and within normal limits. Visualized colon and small bowel are within normal limits. Vascular/Lymphatic: Vascular calcifications are noted. No aneurysmal dilatation is seen. Reactive lymph nodes are noted in the inguinal regions bilaterally right slightly greater than left. Reproductive:   Prostate is within normal limits. Other:  No free fluid is noted within the pelvis. Musculoskeletal: No acute bony abnormality is noted. In the medial right groin and proximal right thigh there is an area of inflammation with air within consistent with focal abscess. Only minimal fluid component is noted. Surrounding musculature appears within normal limits. Proximal IMPRESSION: Subcutaneous abscess in the medial aspect of the right groin and proximal right thigh containing mostly air. No large fluid collection is identified. Reactive adenopathy is identified. No involvement of the scrotum is identified. Electronically Signed   By: Alcide Clever M.D.   On: 04/21/2021 19:48   DG Chest Port 1 View  Result Date: 04/21/2021 CLINICAL DATA:  Questionable sepsis EXAM: PORTABLE CHEST 1 VIEW COMPARISON:  08/03/2009 FINDINGS: Cardiac and mediastinal contours are within normal limits. No focal pulmonary opacity. No pleural effusion or pneumothorax. No acute osseous abnormality. IMPRESSION: No acute cardiopulmonary process. Electronically Signed   By: Wiliam Ke M.D.   On: 04/21/2021 19:39    Anti-infectives: Anti-infectives (From admission, onward)    Start     Dose/Rate Route Frequency Ordered Stop   04/23/21 0800  vancomycin (VANCOCIN) IVPB 1000 mg/200 mL premix  Status:  Discontinued        1,000 mg 200 mL/hr over 60 Minutes Intravenous Every 24 hours 04/22/21 0254 04/22/21 0306   04/22/21 0800  vancomycin (VANCOCIN) IVPB 1000 mg/200 mL premix        1,000 mg 200 mL/hr over 60 Minutes Intravenous Every 24 hours 04/22/21 0306     04/22/21 0400  piperacillin-tazobactam (  ZOSYN) IVPB 3.375 g        3.375 g 12.5 mL/hr over 240 Minutes Intravenous Every 8 hours 04/22/21 0254     04/21/21 1900  piperacillin-tazobactam (ZOSYN) IVPB 3.375 g        3.375 g 100 mL/hr over 30 Minutes Intravenous  Once 04/21/21 1846 04/21/21 2010   04/21/21 1845  vancomycin (VANCOCIN) IVPB 1000 mg/200 mL premix        1,000  mg 200 mL/hr over 60 Minutes Intravenous  Once 04/21/21 1843 04/21/21 2124   04/21/21 1845  ceFEPIme (MAXIPIME) 2 g in sodium chloride 0.9 % 100 mL IVPB  Status:  Discontinued        2 g 200 mL/hr over 30 Minutes Intravenous  Once 04/21/21 1843 04/21/21 1846   04/21/21 1815  clindamycin (CLEOCIN) IVPB 600 mg        600 mg 100 mL/hr over 30 Minutes Intravenous  Once 04/21/21 1802 04/21/21 1908       Assessment/Plan: Right thigh/groin necrotizing fasciitis  Leave wound packing today.  Will change in the morning to determine if he needs to go back to the OR tomorrow for further debridement  Continue IV antibiotics  Acute renal failure --watch creatinine.  Bolus IV fluids  Diabetes --CBG's improved.  Continue insulin  LOS: 0 days    Abigail Miyamoto 04/22/2021

## 2021-04-22 NOTE — Plan of Care (Signed)

## 2021-04-22 NOTE — Progress Notes (Signed)
Inpatient Diabetes Program Recommendations  AACE/ADA: New Consensus Statement on Inpatient Glycemic Control (2015)  Target Ranges:  Prepandial:   less than 140 mg/dL      Peak postprandial:   less than 180 mg/dL (1-2 hours)      Critically ill patients:  140 - 180 mg/dL   Lab Results  Component Value Date   GLUCAP 218 (H) 04/22/2021   HGBA1C 14.2 (H) 04/22/2021    Review of Glycemic Control Results for Bradley Davis, Bradley Davis (MRN [id number]) as of 04/22/2021 09:51  Ref. Range 04/22/2021 00:50 04/22/2021 04:00 04/22/2021 07:32  Glucose-Capillary Latest Ref Range: 70 - 99 mg/dL 627 (H) 035 (H) 009 (H)   Diabetes history: DM 2 Outpatient Diabetes medications: Omnipod insulin pump Last recorded basal rate was from 09/2020 and it was 1.8 units/hr- this is 43.2 units of basal insulin in a 24 hour period.  Current orders for Inpatient glycemic control:  Novolog resistant q 4 hours  Inpatient Diabetes Program Recommendations:    Based on last documented basal rate, consider adding Levemir 22 units bid.  Also please add Novolog 8 units tid with meals (hold if patient eats less than 50% or NPO). It appears patient was on Jardiance prior to admit which can increase risk for genital infections- please consider not restarting at discharge.   Thanks  Beryl Meager, RN, BC-ADM Inpatient Diabetes Coordinator Pager [phone]  (8a-5p)

## 2021-04-22 NOTE — Progress Notes (Signed)
Pharmacy Antibiotic Note  Bradley Davis is a 35 y.o. male admitted on 04/21/2021 with  necrotizing fasciitis  of right groin/thigh.  Has been on clindamycin as outpatient with worsening pain.  S/p I&D today.  Pharmacy has been consulted for Vancomycin & Zosyn dosing.  Initial doses given at [facility].   AKI- baseline Scr ~0.9  Plan: Zosyn 3.375g IV q8h (4 hour infusion). Vancomycin 1gm IV q24h to target AUC 400-550 Check Vancomycin levels at steady state Monitor renal function and cx data   Height: 5\' 10"  (177.8 cm) Weight: 111.6 kg (246 lb) IBW/kg (Calculated) : 73  Temp (24hrs), Avg:100 F (37.8 C), Min:99.1 F (37.3 C), Max:101 F (38.3 C)  Recent Labs  Lab 04/21/21 1748 04/21/21 2023 04/21/21 2135  WBC 15.0*  --   --   CREATININE 2.57*  --  2.55*  LATICACIDVEN 2.6* 2.3*  --     Estimated Creatinine Clearance: 50.6 mL/min (A) (by C-G formula based on SCr of 2.55 mg/dL (H)).    Allergies  Allergen Reactions   Ibuprofen Swelling    Pt reports taking after reported allergy w/ no reactions    Antimicrobials this admission: 10/14 Vancomycin >>  10/14 Zosyn >>  10/14 Clindamycin x1 (was on PTA)  Dose adjustments this admission:  Microbiology results: 10/14 BCx:  10/14 UCx:  10/15 Wound cx of abscess:   Thank you for allowing pharmacy to be a part of this patient's care.  11/15 PharmD 04/22/2021 2:35 AM

## 2021-04-22 NOTE — Transfer of Care (Signed)
Immediate Anesthesia Transfer of Care Note  Patient: Bradley Davis  Procedure(s) Performed: INCISION, DRAINAGE, DEBRIDEMENT OF RIGHT THIGH/GROIN INFECTION (Right: Thigh)  Patient Location: PACU  Anesthesia Type:General  Level of Consciousness: awake, alert , oriented and patient cooperative  Airway & Oxygen Therapy: Patient Spontanous Breathing  Post-op Assessment: Report given to RN and Post -op Vital signs reviewed and stable  Post vital signs: Reviewed and stable  Last Vitals:  Vitals Value Taken Time  BP 145/70 04/22/21 0049  Temp    Pulse 109 04/22/21 0051  Resp 20 04/22/21 0051  SpO2 95 % 04/22/21 0051  Vitals shown include unvalidated device data.  Last Pain:  Vitals:   04/21/21 2323  TempSrc:   PainSc: 6          Complications: No notable events documented.

## 2021-04-22 NOTE — Op Note (Signed)
Bradley Davis 04/22/2021   Pre-op Diagnosis: Right groin/thigh infection     Post-op Diagnosis: Necrotizing fasciitis right groin/thigh  Procedure(s): INCISION, DRAINAGE, DEBRIDEMENT OF RIGHT THIGH/GROIN NECROTIZING FASCIITIS  Surgeon(s): Abigail Miyamoto, MD  Anesthesia: General  Staff:  Circulator: Laurance Flatten, RN Scrub Person: Gaye Alken R  Estimated Blood Loss: Minimal               Indications: This is a 35 year old gentleman with diabetes who presented with increasing pain in the right thigh and groin.  He was on oral antibiotics but failed to improve.  He presented to [facility] was found to have an elevated white blood count and lactic acid level.  He was tachycardic.  He underwent a CT scan showing inflammation in the right thigh and groin area with an area consistent with a possible necrotizing infection.  He was transferred to [facility] and the decision was made to proceed to the operating room emergently  Findings: The patient was found to have infection in the subcutaneous tissue and muscle of the right proximal medial thigh going slightly toward the groin and scrotum.  Subcutaneous fat and muscle were debrided with the cautery.  Excisional debridement:  1.  Progress note or procedure note with a detailed description of the procedure.  2.  Tool used for debridement (curette, scapel, etc.) scalpel and cautery  3.  Frequency of surgical debridement.   First debridement at this procedure  4.  Measurement of total devitalized tissue (wound surface) before and after surgical debridement.   12 cm  5.  Area and depth of devitalized tissue removed from wound.  Right groin and thigh down to muscle and fascia  6.  Blood loss and description of tissue removed.  Necrotic fat and muscle.  Minimal blood loss  7.  Evidence of the progress of the wound's response to treatment.  A.  Current wound volume (current dimensions and depth).  12 cm x 2  cm x 2 cm  B.  Presence (and extent of) of infection.  Present  C.  Presence (and extent of) of non viable tissue.  Present  D.  Other material in the wound that is expected to inhibit healing.  None  8.  Was there any viable tissue removed (measurements): No viable tissue removed  Procedure: The patient was brought to the operating room identifies a correct patient.  He is placed upon the operating table and general anesthesia was induced.  His right groin and proximal thigh were prepped and draped in usual sterile fashion including the scrotum.  There was an area of crepitus in the area of the groin.  I made a longitudinal incision at the skin fold with a scalpel.  Dishwater type fluid was found and cultures were obtained.  I completed an ellipse of skin with a scalpel.  There was underlying necrotic fat and a small amount of necrotic muscle.  This was debrided with the cautery.  The rest of the surrounding tissue appeared healthy.  It did not appear to track posteriorly or onto the scrotum.  I irrigated the wound extensively with saline.  Hemostasis was achieved with the cautery.  I anesthetized the wound with Marcaine.  It was then packed with a saline soaked Kerlix. Approximately 24 cm of skin, subcutaneous tissue, and muscle was sharply debrided with the cautery.  The patient tolerated procedure well.  All the counts were correct at the end of the procedure.  He was then  extubated in the operating room and taken in stable condition to the recovery room.          Abigail Miyamoto   Date: 04/22/2021  Time: 12:49 AM

## 2021-04-22 NOTE — Anesthesia Procedure Notes (Signed)
Procedure Name: Intubation Date/Time: 04/22/2021 12:17 AM Performed by: Raenette Rover, CRNA Pre-anesthesia Checklist: Patient identified, Emergency Drugs available, Suction available and Patient being monitored Patient Re-evaluated:Patient Re-evaluated prior to induction Oxygen Delivery Method: Circle system utilized Preoxygenation: Pre-oxygenation with 100% oxygen Induction Type: IV induction, Rapid sequence and Cricoid Pressure applied Laryngoscope Size: Mac and 4 Grade View: Grade I Tube type: Oral Tube size: 7.0 mm Number of attempts: 1 Airway Equipment and Method: Stylet Placement Confirmation: ETT inserted through vocal cords under direct vision, positive ETCO2 and breath sounds checked- equal and bilateral Secured at: 23 cm Tube secured with: Tape Dental Injury: Teeth and Oropharynx as per pre-operative assessment

## 2021-04-22 NOTE — Plan of Care (Signed)
  Problem: Education: Goal: Knowledge of General Education information will improve Description Including pain rating scale, medication(s)/side effects and non-pharmacologic comfort measures Outcome: Progressing   

## 2021-04-22 NOTE — Anesthesia Postprocedure Evaluation (Signed)
Anesthesia Post Note  Patient: Bradley Davis  Procedure(s) Performed: INCISION, DRAINAGE, DEBRIDEMENT OF RIGHT THIGH/GROIN INFECTION (Right: Thigh)     Patient location during evaluation: PACU Anesthesia Type: General Level of consciousness: awake and alert, patient cooperative and oriented Pain management: pain level controlled Vital Signs Assessment: post-procedure vital signs reviewed and stable Respiratory status: spontaneous breathing, nonlabored ventilation and respiratory function stable Cardiovascular status: blood pressure returned to baseline and stable Postop Assessment: no apparent nausea or vomiting and adequate PO intake Anesthetic complications: no   No notable events documented.  Last Vitals:  Vitals:   04/22/21 0048 04/22/21 0100  BP: (!) 145/70 (!) 150/72  Pulse: (!) 110 (!) 105  Resp: 17 18  Temp: 37.7 C   SpO2: 93% 93%    Last Pain:  Vitals:   04/22/21 0048  TempSrc:   PainSc: 5                  Emogene Muratalla,E. Laruth Hanger

## 2021-04-22 NOTE — Consult Note (Addendum)
Medical Consultation  Bradley Davis VQQ:[id number] DOB: October 06, 1985 DOA: 04/21/2021 PCP: Andreas Blower., MD   Requesting physician: Dr. Magnus Ivan Date of consultation: 04/22/21 Reason for consultation: DM, AKI  Impression/Recommendations DM2     - continue SSI, DM diet, glucose checks     - A1c is 14.2; he has regular follow up wit endocrinology; he is on an insulin pump at home     - will consult diabetic coordinator  AKI on CKD3a     - follows w/ nephro outpt     - looks like his baseline is about 1.9 from [facility] Everywhere labs     - hold his ACEi for right now, check renal US     - fluids     - would recommend looking at a different abx combination if possible; vanc/zosyn combo tends to be irritating to the kidney  HTN     - holding his ACEi for right now; will have PRN available  Hyponatremia     - continue fluids; follow  Right thigh/groin necrotizing fasciitis     - per primary team; including abx, pain control, further bedridement  [facility] will follow-up again tomorrow. Please contact me if I can be of assistance in the meanwhile. Thank you for this consultation.  Chief Complaint: groin pain  HPI:  Bradley Davis is a 35 y.o. male with medical history significant of CKD3a, DM2, HTN, proteinuria. Presenting with right groin pain. Symptoms started 6 days ago. He noticed an area that was swollen and red. He initially thought he had an ingrown hair. He tried cleaning the area in hopes that it would resolve. 3 days later he had increased pain and swelling. He decided to go to Urgent Care. He was placed on abx. However, abx didn't help. He continued to have pain and swelling. He decided that he should go to the ED for evaluation.   He was admitted to the surgical service and found that he had nec fasciitis of the right groin. He has successfully completed the initial I&D of his right groin nec fasciitis. It was noted that he was in AKI. It was also noted that his DM was not  controlled. [facility] was called for help with these two areas.   As far as his DM goes, his normal management at home is with an insulin pump/novolog. He follows with [facility]; and reports good control at home. As far as his CKD3a goes, he follows with [facility]. Recent labs (August 2022) show a baseline Scr of 1.9.  Review of Systems:  Denies CP, palpitations, ab pain, dyspnea, N/V/D, dysuria. Reports groin pain, fever. Remainder of ROS is negative for all not mentioned in HPI.  Past Medical History:  Diagnosis Date   Diabetes mellitus    Hypertension    Past Surgical History:  Procedure Laterality Date   INCISION AND DRAINAGE ABSCESS Right 04/21/2021   Procedure: INCISION, DRAINAGE, DEBRIDEMENT OF RIGHT THIGH/GROIN INFECTION;  Surgeon: Abigail Miyamoto, MD;  Location: [facility] ORS;  Service: General;  Laterality: Right;   Social History:  reports that he has never smoked. He has never used smokeless tobacco. He reports that he does not currently use alcohol. He reports that he does not use drugs.  Allergies  Allergen Reactions   Ibuprofen Swelling    Pt reports taking after reported allergy w/ no reactions   History reviewed. No pertinent family history.  Prior to Admission medications   Medication Sig Start Date End Date  Taking? Authorizing Provider  atorvastatin (LIPITOR) 80 MG tablet Take 80 tablets by mouth daily. 12/22/18  Yes [provider]  benazepril (LOTENSIN) 20 MG tablet Take 20 mg by mouth daily. 05/22/20  Yes [provider]  clindamycin (CLEOCIN) 300 MG capsule Take 300 mg by mouth 4 (four) times daily as needed. 04/19/21  Yes [provider]  Insulin Aspart (NOVOLOG [place]) Inject into the skin.   Yes [provider]  insulin lispro (HUMALOG) 100 UNIT/ML injection Inject 2 Units into the skin every hour. Diabetic insulin pump    [provider]  ondansetron (ZOFRAN) 4 MG tablet Take 1 tablet (4 mg total) by  mouth every 6 (six) hours. 04/07/17   Hayden Rasmussen, NP  silver sulfADIAZINE (SILVADENE) 1 % cream Apply 1 application topically daily. 10/25/18   Maxwell Caul, PA-C  sucralfate (CARAFATE) 1 g tablet Take 1 tablet (1 g total) by mouth 4 (four) times daily -  with meals and at bedtime. 04/07/17   Hayden Rasmussen, NP  Vitamin D, Ergocalciferol, (DRISDOL) 1.25 MG (50000 UNIT) CAPS capsule Take 50,000 Units by mouth 2 (two) times a week. 03/02/21   [provider]   Physical Exam: Blood pressure 128/72, pulse 95, temperature 98.6 F (37 C), temperature source Oral, resp. rate 16, height 5\' 10"  (1.778 m), weight 111.6 kg, SpO2 98 %. Vitals:   04/22/21 0152 04/22/21 0440  BP: 131/68 128/72  Pulse: 98 95  Resp: 18 16  Temp: (!) 100.4 F (38 C) 98.6 F (37 C)  SpO2: 97% 98%    General: 35 y.o. male resting in bed in NAD Eyes: PERRL, normal sclera ENMT: Nares patent w/o discharge, orophaynx clear, dentition normal, ears w/o discharge/lesions/ulcers Neck: Supple, trachea midline Cardiovascular: RRR, +S1, S2, no m/g/r, equal pulses throughout Respiratory: CTABL, no w/r/r, normal WOB GI: BS+, NDNT, no masses noted, no organomegaly noted MSK: No e/c/c Neuro: A&O x 3, no focal deficits Psyc: Appropriate interaction and affect, calm/cooperative  Labs on Admission:  Basic Metabolic Panel: Recent Labs  Lab 04/21/21 1748 04/21/21 2135 04/22/21 0658  NA 126* 128* 129*  K 3.5 3.0* 3.6  CL 93* 95* 97*  CO2 22 23 22   GLUCOSE 513* 261* 217*  BUN 31* 31* 34*  CREATININE 2.57* 2.55* 2.59*  CALCIUM 7.9* 7.7* 7.8*   Liver Function Tests: Recent Labs  Lab 04/21/21 1748  AST 44*  ALT 59*  ALKPHOS 126  BILITOT 0.7  PROT 6.3*  ALBUMIN 2.4*   No results for input(s): LIPASE, AMYLASE in the last 168 hours. No results for input(s): AMMONIA in the last 168 hours. CBC: Recent Labs  Lab 04/21/21 1748 04/22/21 0658  WBC 15.0* 15.9*  NEUTROABS 12.7*  --   HGB 11.2* 10.9*  HCT 32.6*  32.4*  MCV 81.3 82.9  PLT 273 312   Cardiac Enzymes: No results for input(s): CKTOTAL, CKMB, CKMBINDEX, TROPONINI in the last 168 hours. BNP: Invalid input(s): POCBNP CBG: Recent Labs  Lab 04/21/21 2314 04/22/21 0029 04/22/21 0050 04/22/21 0400 04/22/21 0732  GLUCAP 196* 157* 158* 224* 218*    Radiological Exams on Admission: CT PELVIS WO CONTRAST  Result Date: 04/21/2021 CLINICAL DATA:  Groin pain for several days with palpable abnormality in inflammation EXAM: CT PELVIS WITHOUT CONTRAST TECHNIQUE: Multidetector CT imaging of the pelvis was performed following the standard protocol without intravenous contrast. COMPARISON:  None. FINDINGS: Urinary Tract:  Bladder is well distended. Bowel: The appendix is well visualized and within normal limits. Visualized  colon and small bowel are within normal limits. Vascular/Lymphatic: Vascular calcifications are noted. No aneurysmal dilatation is seen. Reactive lymph nodes are noted in the inguinal regions bilaterally right slightly greater than left. Reproductive:  Prostate is within normal limits. Other:  No free fluid is noted within the pelvis. Musculoskeletal: No acute bony abnormality is noted. In the medial right groin and proximal right thigh there is an area of inflammation with air within consistent with focal abscess. Only minimal fluid component is noted. Surrounding musculature appears within normal limits. Proximal IMPRESSION: Subcutaneous abscess in the medial aspect of the right groin and proximal right thigh containing mostly air. No large fluid collection is identified. Reactive adenopathy is identified. No involvement of the scrotum is identified. Electronically Signed   By: Alcide Clever M.D.   On: 04/21/2021 19:48   DG Chest Port 1 View  Result Date: 04/21/2021 CLINICAL DATA:  Questionable sepsis EXAM: PORTABLE CHEST 1 VIEW COMPARISON:  08/03/2009 FINDINGS: Cardiac and mediastinal contours are within normal limits. No focal  pulmonary opacity. No pleural effusion or pneumothorax. No acute osseous abnormality. IMPRESSION: No acute cardiopulmonary process. Electronically Signed   By: Wiliam Ke M.D.   On: 04/21/2021 19:39    EKG: Independently reviewed. Sinus tach, no st elevations  Status is: Inpatient  Emory Gallentine A Arkel Cartwright DO [facility]  If 7PM-7AM, please contact night-coverage [email or web address] 04/22/2021, 8:33 AM

## 2021-04-22 NOTE — Progress Notes (Signed)
   04/22/21 2021  Assess: MEWS Score  Temp (!) 102.3 F (39.1 C)  BP (!) 175/83  Pulse Rate (!) 109  Resp 16  Level of Consciousness Alert  SpO2 96 %  O2 Device Room Air  Assess: MEWS Score  MEWS Temp 2  MEWS Systolic 0  MEWS Pulse 1  MEWS RR 0  MEWS LOC 0  MEWS Score 3  MEWS Score Color Yellow  Assess: if the MEWS score is Yellow or Red  Were vital signs taken at a resting state? Yes  Focused Assessment Change from prior assessment (see assessment flowsheet)  Does the patient meet 2 or more of the SIRS criteria? Yes  Does the patient have a confirmed or suspected source of infection? Yes  Provider and Rapid Response Notified? Yes (Provider notified and aware of yellow mews)  MEWS guidelines implemented *See Row Information* Yes  Treat  MEWS Interventions Escalated (See documentation below)  Pain Scale 0-10  Pain Score 6  Pain Type Acute pain;Surgical pain  Pain Location Groin (medial part of thigh)  Pain Orientation Right;Anterior;Medial  Pain Descriptors / Indicators Aching;Constant;Throbbing  Pain Frequency Constant  Pain Onset On-going  Patients Stated Pain Goal 3  Pain Intervention(s) Medication (See eMAR)  Multiple Pain Sites No  Take Vital Signs  Increase Vital Sign Frequency  Yellow: Q 2hr X 2 then Q 4hr X 2, if remains yellow, continue Q 4hrs  Escalate  MEWS: Escalate Yellow: discuss with charge nurse/RN and consider discussing with provider and RRT  Notify: Charge Nurse/RN  Name of Charge Nurse/RN Notified Salli Quarry, RN  Date Charge Nurse/RN Notified 04/22/21  Time Charge Nurse/RN Notified 2025  Notify: Provider  Provider Name/Title Linton Flemings  Date Provider Notified 04/22/21  Time Provider Notified 0845  Notification Type Page  Notification Reason Change in status  Provider response See new orders  Date of Provider Response 04/22/21  Time of Provider Response 2048  Notify: Rapid Response  Name of Rapid Response RN Notified not notified   Document  Progress note created (see row info) Yes  Assess: SIRS CRITERIA  SIRS Temperature  1  SIRS Pulse 1  SIRS Respirations  0  SIRS WBC 0  SIRS Score Sum  2  [facility] attending aware of yellow MEWS. Pain pill and Tylenol given to patient per order.

## 2021-04-23 ENCOUNTER — Inpatient Hospital Stay (HOSPITAL_COMMUNITY): Payer: Self-pay | Admitting: Certified Registered Nurse Anesthetist

## 2021-04-23 ENCOUNTER — Encounter (HOSPITAL_COMMUNITY): Admission: EM | Disposition: A | Discharge status: Home / Self Care | Payer: Self-pay | Source: Home / Self Care

## 2021-04-23 DIAGNOSIS — N182 Chronic kidney disease, stage 2 (mild): Secondary | ICD-10-CM | POA: Diagnosis present

## 2021-04-23 DIAGNOSIS — L02214 Cutaneous abscess of groin: Secondary | ICD-10-CM

## 2021-04-23 DIAGNOSIS — M726 Necrotizing fasciitis: Secondary | ICD-10-CM

## 2021-04-23 DIAGNOSIS — E1022 Type 1 diabetes mellitus with diabetic chronic kidney disease: Secondary | ICD-10-CM

## 2021-04-23 DIAGNOSIS — I10 Essential (primary) hypertension: Secondary | ICD-10-CM | POA: Diagnosis present

## 2021-04-23 DIAGNOSIS — E1029 Type 1 diabetes mellitus with other diabetic kidney complication: Secondary | ICD-10-CM | POA: Diagnosis present

## 2021-04-23 DIAGNOSIS — N179 Acute kidney failure, unspecified: Secondary | ICD-10-CM | POA: Diagnosis present

## 2021-04-23 DIAGNOSIS — E876 Hypokalemia: Secondary | ICD-10-CM

## 2021-04-23 HISTORY — PX: IRRIGATION AND DEBRIDEMENT ABSCESS: SHX5252

## 2021-04-23 LAB — CBC
HCT: 33.2 % — ABNORMAL LOW (ref 39.0–52.0)
Hemoglobin: 10.7 g/dL — ABNORMAL LOW (ref 13.0–17.0)
MCH: 27.4 pg (ref 26.0–34.0)
MCHC: 32.2 g/dL (ref 30.0–36.0)
MCV: 85.1 fL (ref 80.0–100.0)
Platelets: 358 10*3/uL (ref 150–400)
RBC: 3.9 MIL/uL — ABNORMAL LOW (ref 4.22–5.81)
RDW: 14.1 % (ref 11.5–15.5)
WBC: 16.1 10*3/uL — ABNORMAL HIGH (ref 4.0–10.5)
nRBC: 0 % (ref 0.0–0.2)

## 2021-04-23 LAB — GLUCOSE, CAPILLARY
Glucose-Capillary: 112 mg/dL — ABNORMAL HIGH (ref 70–99)
Glucose-Capillary: 114 mg/dL — ABNORMAL HIGH (ref 70–99)
Glucose-Capillary: 114 mg/dL — ABNORMAL HIGH (ref 70–99)
Glucose-Capillary: 163 mg/dL — ABNORMAL HIGH (ref 70–99)
Glucose-Capillary: 221 mg/dL — ABNORMAL HIGH (ref 70–99)
Glucose-Capillary: 238 mg/dL — ABNORMAL HIGH (ref 70–99)
Glucose-Capillary: 270 mg/dL — ABNORMAL HIGH (ref 70–99)
Glucose-Capillary: 291 mg/dL — ABNORMAL HIGH (ref 70–99)

## 2021-04-23 LAB — URINE CULTURE: Culture: NO GROWTH

## 2021-04-23 LAB — BASIC METABOLIC PANEL
Anion gap: 8 (ref 5–15)
BUN: 36 mg/dL — ABNORMAL HIGH (ref 6–20)
CO2: 22 mmol/L (ref 22–32)
Calcium: 7.6 mg/dL — ABNORMAL LOW (ref 8.9–10.3)
Chloride: 102 mmol/L (ref 98–111)
Creatinine, Ser: 3.17 mg/dL — ABNORMAL HIGH (ref 0.61–1.24)
GFR, Estimated: 25 mL/min — ABNORMAL LOW (ref >60)
Glucose, Bld: 135 mg/dL — ABNORMAL HIGH (ref 70–99)
Potassium: 3.3 mmol/L — ABNORMAL LOW (ref 3.5–5.1)
Sodium: 132 mmol/L — ABNORMAL LOW (ref 135–145)

## 2021-04-23 LAB — SODIUM, URINE, RANDOM: Sodium, Ur: 13 mmol/L

## 2021-04-23 LAB — CREATININE, URINE, RANDOM: Creatinine, Urine: 112.45 mg/dL

## 2021-04-23 LAB — CK: Total CK: 769 U/L — ABNORMAL HIGH (ref 49–397)

## 2021-04-23 LAB — MAGNESIUM: Magnesium: 2 mg/dL (ref 1.7–2.4)

## 2021-04-23 LAB — HEMOGLOBIN A1C
Hgb A1c MFr Bld: 14.4 % — ABNORMAL HIGH (ref 4.8–5.6)
Mean Plasma Glucose: 366.58 mg/dL

## 2021-04-23 SURGERY — IRRIGATION AND DEBRIDEMENT ABSCESS
Anesthesia: General | Laterality: Right

## 2021-04-23 MED ORDER — MIDAZOLAM HCL 2 MG/2ML IJ SOLN: 0.5000 mg | Freq: Once | INTRAMUSCULAR | Status: DC | PRN

## 2021-04-23 MED ORDER — FENTANYL CITRATE (PF) 100 MCG/2ML IJ SOLN
INTRAMUSCULAR | Status: DC | PRN
  Administered 2021-04-23: 100 ug via INTRAVENOUS
  Administered 2021-04-23 (×4): 50 ug via INTRAVENOUS

## 2021-04-23 MED ORDER — FENTANYL CITRATE (PF) 100 MCG/2ML IJ SOLN
INTRAMUSCULAR | Status: AC
  Filled 2021-04-23: qty 2

## 2021-04-23 MED ORDER — ACETAMINOPHEN 500 MG PO TABS
1000.0000 mg | ORAL_TABLET | Freq: Once | ORAL | Status: AC
  Administered 2021-04-23: 1000 mg via ORAL

## 2021-04-23 MED ORDER — ONDANSETRON HCL 4 MG/2ML IJ SOLN
INTRAMUSCULAR | Status: DC | PRN
  Administered 2021-04-23: 4 mg via INTRAVENOUS

## 2021-04-23 MED ORDER — PROPOFOL 10 MG/ML IV BOLUS
INTRAVENOUS | Status: DC | PRN
  Administered 2021-04-23: 200 mg via INTRAVENOUS

## 2021-04-23 MED ORDER — LINEZOLID 600 MG/300ML IV SOLN
600.0000 mg | Freq: Two times a day (BID) | INTRAVENOUS | Status: DC
  Administered 2021-04-23 – 2021-04-29 (×12): 600 mg via INTRAVENOUS
  Filled 2021-04-23 (×12): qty 300

## 2021-04-23 MED ORDER — SODIUM CHLORIDE 0.9 % IV BOLUS
500.0000 mL | Freq: Once | INTRAVENOUS | Status: AC
  Administered 2021-04-23: 500 mL via INTRAVENOUS

## 2021-04-23 MED ORDER — MIDAZOLAM HCL 2 MG/2ML IJ SOLN
INTRAMUSCULAR | Status: AC
  Filled 2021-04-23: qty 2

## 2021-04-23 MED ORDER — BUPIVACAINE-EPINEPHRINE (PF) 0.5% -1:200000 IJ SOLN
INTRAMUSCULAR | Status: AC
  Filled 2021-04-23: qty 30

## 2021-04-23 MED ORDER — DEXAMETHASONE SODIUM PHOSPHATE 10 MG/ML IJ SOLN
INTRAMUSCULAR | Status: DC | PRN
  Administered 2021-04-23: 4 mg via INTRAVENOUS

## 2021-04-23 MED ORDER — MIDAZOLAM HCL 5 MG/5ML IJ SOLN
INTRAMUSCULAR | Status: DC | PRN
  Administered 2021-04-23: 2 mg via INTRAVENOUS

## 2021-04-23 MED ORDER — SODIUM CHLORIDE 0.9 % IV SOLN
2.0000 g | Freq: Two times a day (BID) | INTRAVENOUS | Status: DC
  Administered 2021-04-23 (×2): 2 g via INTRAVENOUS
  Filled 2021-04-23 (×3): qty 2

## 2021-04-23 MED ORDER — BUPIVACAINE-EPINEPHRINE 0.25% -1:200000 IJ SOLN
INTRAMUSCULAR | Status: DC | PRN
  Administered 2021-04-23: 30 mL

## 2021-04-23 MED ORDER — ACETAMINOPHEN 500 MG PO TABS
ORAL_TABLET | ORAL | Status: AC
  Filled 2021-04-23: qty 2

## 2021-04-23 MED ORDER — PROMETHAZINE HCL 25 MG/ML IJ SOLN: 6.2500 mg | INTRAMUSCULAR | Status: DC | PRN

## 2021-04-23 MED ORDER — MEPERIDINE HCL 50 MG/ML IJ SOLN: 6.2500 mg | INTRAMUSCULAR | Status: DC | PRN

## 2021-04-23 MED ORDER — 0.9 % SODIUM CHLORIDE (POUR BTL) OPTIME
TOPICAL | Status: DC | PRN
  Administered 2021-04-23: 1000 mL

## 2021-04-23 MED ORDER — HYDROMORPHONE HCL 1 MG/ML IJ SOLN: 0.2500 mg | INTRAMUSCULAR | Status: DC | PRN

## 2021-04-23 MED ORDER — SODIUM CHLORIDE 0.9 % IV SOLN: INTRAVENOUS | Status: DC

## 2021-04-23 MED ORDER — ONDANSETRON HCL 4 MG/2ML IJ SOLN
INTRAMUSCULAR | Status: AC
  Filled 2021-04-23: qty 2

## 2021-04-23 MED ORDER — LIDOCAINE HCL (PF) 2 % IJ SOLN
INTRAMUSCULAR | Status: AC
  Filled 2021-04-23: qty 5

## 2021-04-23 MED ORDER — LACTATED RINGERS IV SOLN: INTRAVENOUS | Status: DC | PRN

## 2021-04-23 MED ORDER — OXYCODONE HCL 5 MG/5ML PO SOLN: 5.0000 mg | Freq: Once | ORAL | Status: DC | PRN

## 2021-04-23 MED ORDER — PROPOFOL 10 MG/ML IV BOLUS
INTRAVENOUS | Status: AC
  Filled 2021-04-23: qty 20

## 2021-04-23 MED ORDER — OXYCODONE HCL 5 MG PO TABS
5.0000 mg | ORAL_TABLET | Freq: Once | ORAL | Status: DC | PRN
Start: 2021-04-23 — End: 2021-04-23

## 2021-04-23 MED ORDER — POTASSIUM CHLORIDE CRYS ER 10 MEQ PO TBCR
40.0000 meq | EXTENDED_RELEASE_TABLET | Freq: Once | ORAL | Status: AC
  Administered 2021-04-23: 40 meq via ORAL
  Filled 2021-04-23: qty 4

## 2021-04-23 MED ORDER — ROCURONIUM BROMIDE 10 MG/ML (PF) SYRINGE
PREFILLED_SYRINGE | INTRAVENOUS | Status: DC | PRN
Start: 2021-04-23 — End: 2021-04-23
  Administered 2021-04-23: 70 mg via INTRAVENOUS

## 2021-04-23 MED ORDER — AMLODIPINE BESYLATE 5 MG PO TABS
5.0000 mg | ORAL_TABLET | Freq: Every day | ORAL | Status: DC
  Administered 2021-04-23: 5 mg via ORAL
  Filled 2021-04-23: qty 1

## 2021-04-23 MED ORDER — SUGAMMADEX SODIUM 200 MG/2ML IV SOLN
INTRAVENOUS | Status: DC | PRN
  Administered 2021-04-23: 300 mg via INTRAVENOUS

## 2021-04-23 MED ORDER — LIDOCAINE HCL (PF) 2 % IJ SOLN
INTRAMUSCULAR | Status: DC | PRN
  Administered 2021-04-23: 30 mg via INTRADERMAL

## 2021-04-23 MED ORDER — DEXAMETHASONE SODIUM PHOSPHATE 10 MG/ML IJ SOLN
INTRAMUSCULAR | Status: AC
  Filled 2021-04-23: qty 1

## 2021-04-23 SURGICAL SUPPLY — 36 items
BAG COUNTER SPONGE SURGICOUNT (BAG) IMPLANT
BLADE HEX COATED 2.75 (ELECTRODE) ×2 IMPLANT
BLADE SURG SZ10 CARB STEEL (BLADE) ×2 IMPLANT
BNDG GAUZE ELAST 4 BULKY (GAUZE/BANDAGES/DRESSINGS) ×2 IMPLANT
COVER SURGICAL LIGHT HANDLE (MISCELLANEOUS) ×2 IMPLANT
DECANTER SPIKE VIAL GLASS SM (MISCELLANEOUS) IMPLANT
DERMABOND ADVANCED (GAUZE/BANDAGES/DRESSINGS)
DERMABOND ADVANCED .7 DNX12 (GAUZE/BANDAGES/DRESSINGS) IMPLANT
DRAPE LAPAROSCOPIC ABDOMINAL (DRAPES) IMPLANT
DRAPE LAPAROTOMY T 102X78X121 (DRAPES) IMPLANT
DRAPE LAPAROTOMY TRNSV 102X78 (DRAPES) IMPLANT
DRAPE SHEET LG 3/4 BI-LAMINATE (DRAPES) IMPLANT
DRSG PAD ABDOMINAL 8X10 ST (GAUZE/BANDAGES/DRESSINGS) ×2 IMPLANT
ELECT REM PT RETURN 15FT ADLT (MISCELLANEOUS) ×2 IMPLANT
GAUZE SPONGE 4X4 12PLY STRL (GAUZE/BANDAGES/DRESSINGS) ×2 IMPLANT
GLOVE SURG ENC MOIS LTX SZ7 (GLOVE) ×6 IMPLANT
GLOVE SURG ENC MOIS LTX SZ7.5 (GLOVE) ×2 IMPLANT
GLOVE SURG UNDER POLY LF SZ7 (GLOVE) ×2 IMPLANT
GLOVE SURG UNDER POLY LF SZ7.5 (GLOVE) ×6 IMPLANT
GOWN STRL REUS W/ TWL XL LVL3 (GOWN DISPOSABLE) ×1 IMPLANT
GOWN STRL REUS W/TWL LRG LVL3 (GOWN DISPOSABLE) ×4 IMPLANT
GOWN STRL REUS W/TWL XL LVL3 (GOWN DISPOSABLE) ×3 IMPLANT
KIT BASIN OR (CUSTOM PROCEDURE TRAY) ×2 IMPLANT
KIT TURNOVER KIT A (KITS) ×2 IMPLANT
MARKER SKIN DUAL TIP RULER LAB (MISCELLANEOUS) ×2 IMPLANT
NEEDLE HYPO 25X1 1.5 SAFETY (NEEDLE) ×2 IMPLANT
NS IRRIG 1000ML POUR BTL (IV SOLUTION) ×2 IMPLANT
PACK BASIC VI WITH GOWN DISP (CUSTOM PROCEDURE TRAY) ×2 IMPLANT
PENCIL SMOKE EVACUATOR (MISCELLANEOUS) IMPLANT
SPONGE T-LAP 18X18 ~~LOC~~+RFID (SPONGE) IMPLANT
SPONGE T-LAP 4X18 ~~LOC~~+RFID (SPONGE) IMPLANT
STAPLER VISISTAT 35W (STAPLE) IMPLANT
SUT MNCRL AB 4-0 PS2 18 (SUTURE) IMPLANT
SUT VIC AB 3-0 SH 18 (SUTURE) IMPLANT
SYR CONTROL 10ML LL (SYRINGE) ×2 IMPLANT
TOWEL OR 17X26 10 PK STRL BLUE (TOWEL DISPOSABLE) ×2 IMPLANT

## 2021-04-23 NOTE — Progress Notes (Signed)
Pharmacy Antibiotic Note  Bradley Davis is a 35 y.o. male admitted on 04/21/2021 with  necrotizing fasciitis  of right groin/thigh.  Has been on clindamycin as outpatient with worsening pain.  S/p I&D today.  Pharmacy has been consulted for cefepime.   As Scr has trended up, pt is being transitioned to cefepime and linezolid.   Plan: Linezolid 600 mg IV q12h  Cefepime 2 gr IV q12h   Height: 5\' 10"  (177.8 cm) Weight: 111.6 kg (246 lb) IBW/kg (Calculated) : 73  Temp (24hrs), Avg:99.9 F (37.7 C), Min:98.5 F (36.9 C), Max:102.3 F (39.1 C)  Recent Labs  Lab 04/21/21 1748 04/21/21 2023 04/21/21 2135 04/22/21 0658 04/23/21 0310  WBC 15.0*  --   --  15.9* 16.1*  CREATININE 2.57*  --  2.55* 2.59* 3.17*  LATICACIDVEN 2.6* 2.3*  --   --   --      Estimated Creatinine Clearance: 40.7 mL/min (A) (by C-G formula based on SCr of 3.17 mg/dL (H)).    Allergies  Allergen Reactions   Ibuprofen Swelling    Pt reports taking after reported allergy w/ no reactions    Antimicrobials this admission: 10/14 Vancomycin >> 10/16  10/14 Zosyn >> 10/16  10/14 Clindamycin x1 (was on PTA) 10/16 linezolid >>  10/16 cefepime >>   Dose adjustments this admission:  Microbiology results: 10/14 BCx: NGTD 10/14 UCx: sent 10/15 Abscess cx: abundant GPC, abundant GNR   Thank you for allowing pharmacy to be a part of this patient's care.   11/15, PharmD, BCPS 04/23/2021 12:36 PM

## 2021-04-23 NOTE — Op Note (Signed)
IRRIGATION AND DEBRIDEMENT RIGHT GROIN WOUND; RIGHT THIGH DRESSING CHANGE  Procedure Note  Grantley SUHAIB GUZZO 04/23/2021   Pre-op Diagnosis: NECROTIZING FASCIITIS     Post-op Diagnosis: same  Procedure(s): IRRIGATION AND DEBRIDEMENT RIGHT GROIN WOUND; RIGHT THIGH and potential further debridement  Surgeon(s): Abigail Miyamoto, MD  Anesthesia: General  Staff:  Circulator: Gordy Clement, RN Scrub Person: Clarnce Flock, CST  Estimated Blood Loss: Minimal               Indications: This is a 35 year old gentleman who is just over 24 hours status post debridement of the right thigh and groin for necrotizing fasciitis.  Because of increasing white blood count, fever, and increasing creatinine, the decision was made to proceed back to the operating room for a dressing change and possible further debridement  Findings: The patient was found to have further necrosis of some of the very proximal medial thigh muscle as well as further extension of fluid in the fascial plane down the thigh.  24 cm of skin, subcutaneous tissue, and muscle were further debrided  Procedure: Patient was brought to operating identifies correct patient.  He is placed on the operating table general anesthesia was induced.  He was placed in a right frog-leg position.  I removed his packing.  The surrounding area was then prepped and draped in the usual sterile fashion including the thigh, abdominal wall, and scrotum on the right side.  The patient had further necrosis of some of the medial thigh muscle and fat which I sharply debrided with the cautery.  I was then able to identify tracking along the fascial plane further down the thigh.  I had to excise further skin and fat over the top of this area with the cautery.  I did not see any further necrotic muscle.  I excised approximate 24 more square centimeters of tissue.  There were superficial veins bleeding in the proximal groin which I controlled with 2-0 silk suture  ligatures.  I then copiously irrigated the wounds with saline.  Hemostasis appeared to be achieved.  I anesthetized the surrounding area further with Marcaine.  I then packed the wound with a 3 inch daily soaked Kerlix.  Dry gauze and ABDs were placed over this.  The patient tolerated the procedure well.  All the counts were correct at the end of the procedure.  He was then extubated in the operating room and taken in stable condition to the recovery room.  Excisional debridement:  1.  Progress note or procedure note with a detailed description of the procedure.  See above  2.  Tool used for debridement (curette, scapel, etc.) cautery  3.  Frequency of surgical debridement.   24 to 48 hours  4.  Measurement of total devitalized tissue (wound surface) before and after surgical debridement.   24 cm  5.  Area and depth of devitalized tissue removed from wound.  12 cm x 2 cm x 2 cm  6.  Blood loss and description of tissue removed.  Minimal blood loss  7.  Evidence of the progress of the wound's response to treatment.  A.  Current wound volume (current dimensions and depth).  12 cm x 5 cm x 2 cm  B.  Presence (and extent of) of infection.  No obvious gross necrosis  C.  Presence (and extent of) of non viable tissue.  Nonviable tissue removed  D.  Other material in the wound that is expected to inhibit healing.  None  8.  Was there any viable tissue removed (measurements): None          Abigail Miyamoto   Date: 04/23/2021  Time: 10:34 AM

## 2021-04-23 NOTE — Anesthesia Postprocedure Evaluation (Signed)
Anesthesia Post Note  Patient: Bradley Davis  Procedure(s) Performed: IRRIGATION AND DEBRIDEMENT RIGHT GROIN WOUND; RIGHT THIGH DRESSING CHANGE (Right)     Patient location during evaluation: PACU Anesthesia Type: General Level of consciousness: awake and alert, patient cooperative and oriented Pain management: pain level controlled Vital Signs Assessment: post-procedure vital signs reviewed and stable Respiratory status: spontaneous breathing, nonlabored ventilation and respiratory function stable Cardiovascular status: blood pressure returned to baseline and stable Postop Assessment: no apparent nausea or vomiting Anesthetic complications: no   No notable events documented.  Last Vitals:  Vitals:   04/23/21 1115 04/23/21 1128  BP: (!) 156/81 (!) 159/82  Pulse: 98 99  Resp: 19 20  Temp: 37.1 C 37.2 C  SpO2: 93% 98%    Last Pain:  Vitals:   04/23/21 1128  TempSrc: Oral  PainSc:                  Evelyne Makepeace,E. Ellijah Leffel

## 2021-04-23 NOTE — Transfer of Care (Signed)
Immediate Anesthesia Transfer of Care Note  Patient: Bradley Davis  Procedure(s) Performed: IRRIGATION AND DEBRIDEMENT RIGHT GROIN WOUND; RIGHT THIGH DRESSING CHANGE (Right)  Patient Location: PACU  Anesthesia Type:General  Level of Consciousness: awake, alert  and oriented  Airway & Oxygen Therapy: Patient Spontanous Breathing and Patient connected to face mask  Post-op Assessment: Report given to RN and Post -op Vital signs reviewed and stable  Post vital signs: Reviewed and stable  Last Vitals:  Vitals Value Taken Time  BP 159/79 04/23/21 1045  Temp    Pulse 112 04/23/21 1046  Resp 21 04/23/21 1046  SpO2 98 % 04/23/21 1046  Vitals shown include unvalidated device data.  Last Pain:  Vitals:   04/23/21 0827  TempSrc: Oral  PainSc:       Patients Stated Pain Goal: 3 (04/23/21 0801)  Complications: No notable events documented.

## 2021-04-23 NOTE — Anesthesia Preprocedure Evaluation (Addendum)
Anesthesia Evaluation  Patient identified by MRN, date of birth, ID band Patient awake    Reviewed: Allergy & Precautions, NPO status , Patient's Chart, lab work & pertinent test results  History of Anesthesia Complications Negative for: history of anesthetic complications  Airway Mallampati: I  TM Distance: >3 FB Neck ROM: Full    Dental  (+) Dental Advisory Given, Teeth Intact   Pulmonary  04/21/2021 SARS coronavirus NEG   breath sounds clear to auscultation       Cardiovascular hypertension, Pt. on medications (-) angina Rhythm:Regular Rate:Normal     Neuro/Psych negative neurological ROS     GI/Hepatic negative GI ROS, Neg liver ROS,   Endo/Other  diabetes, Insulin DependentMorbid obesity  Renal/GU Renal InsufficiencyRenal disease     Musculoskeletal   Abdominal (+) + obese,   Peds  Hematology  (+) Blood dyscrasia (Hb 10.7), anemia ,   Anesthesia Other Findings   Reproductive/Obstetrics                            Anesthesia Physical Anesthesia Plan  ASA: 3  Anesthesia Plan: General   Post-op Pain Management:    Induction: Intravenous  PONV Risk Score and Plan: 2 and Ondansetron and Dexamethasone  Airway Management Planned: Oral ETT  Additional Equipment: None  Intra-op Plan:   Post-operative Plan: Extubation in OR  Informed Consent: I have reviewed the patients History and Physical, chart, labs and discussed the procedure including the risks, benefits and alternatives for the proposed anesthesia with the patient or authorized representative who has indicated his/her understanding and acceptance.     Dental advisory given  Plan Discussed with: CRNA and Surgeon  Anesthesia Plan Comments:        Anesthesia Quick Evaluation

## 2021-04-23 NOTE — Anesthesia Procedure Notes (Signed)
Procedure Name: Intubation Date/Time: 04/23/2021 10:03 AM Performed by: Rosaland Lao, CRNA Pre-anesthesia Checklist: Patient identified, Emergency Drugs available, Suction available and Patient being monitored Patient Re-evaluated:Patient Re-evaluated prior to induction Oxygen Delivery Method: Circle system utilized Preoxygenation: Pre-oxygenation with 100% oxygen Induction Type: IV induction Ventilation: Oral airway inserted - appropriate to patient size Laryngoscope Size: Mac and 4 Grade View: Grade I Tube type: Oral Tube size: 7.0 mm Number of attempts: 1 Airway Equipment and Method: Stylet and Oral airway Placement Confirmation: ETT inserted through vocal cords under direct vision, positive ETCO2 and breath sounds checked- equal and bilateral Tube secured with: Tape Dental Injury: Teeth and Oropharynx as per pre-operative assessment

## 2021-04-23 NOTE — Progress Notes (Addendum)
2 Days Post-Op   Subjective/Chief Complaint: Fever over night Pain at I and D site about the same   Objective: Vital signs in last 24 hours: Temp:  [98.9 F (37.2 C)-102.3 F (39.1 C)] 99.7 F (37.6 C) (10/16 0416) Pulse Rate:  [98-109] 101 (10/16 0416) Resp:  [16-20] 16 (10/16 0416) BP: (136-175)/(73-99) 136/99 (10/16 0416) SpO2:  [92 %-98 %] 93 % (10/16 0416) Last BM Date: 04/21/21  Intake/Output from previous day: 10/15 0701 - 10/16 0700 In: 5254.8 [P.O.:1080; I.V.:2834.4; IV Piggyback:340.4] Out: 1600 [Urine:1600] Intake/Output this shift: No intake/output data recorded.  Exam: Right leg soft, dressing intact  Lab Results:  Recent Labs    04/22/21 0658 04/23/21 0310  WBC 15.9* 16.1*  HGB 10.9* 10.7*  HCT 32.4* 33.2*  PLT 312 358   BMET Recent Labs    04/22/21 0658 04/23/21 0310  NA 129* 132*  K 3.6 3.3*  CL 97* 102  CO2 22 22  GLUCOSE 217* 135*  BUN 34* 36*  CREATININE 2.59* 3.17*  CALCIUM 7.8* 7.6*   PT/INR Recent Labs    04/21/21 1905  LABPROT 15.9*  INR 1.3*   ABG No results for input(s): PHART, HCO3 in the last 72 hours.  Invalid input(s): PCO2, PO2  Studies/Results: CT PELVIS WO CONTRAST  Result Date: 04/21/2021 CLINICAL DATA:  Groin pain for several days with palpable abnormality in inflammation EXAM: CT PELVIS WITHOUT CONTRAST TECHNIQUE: Multidetector CT imaging of the pelvis was performed following the standard protocol without intravenous contrast. COMPARISON:  None. FINDINGS: Urinary Tract:  Bladder is well distended. Bowel: The appendix is well visualized and within normal limits. Visualized colon and small bowel are within normal limits. Vascular/Lymphatic: Vascular calcifications are noted. No aneurysmal dilatation is seen. Reactive lymph nodes are noted in the inguinal regions bilaterally right slightly greater than left. Reproductive:  Prostate is within normal limits. Other:  No free fluid is noted within the pelvis.  Musculoskeletal: No acute bony abnormality is noted. In the medial right groin and proximal right thigh there is an area of inflammation with air within consistent with focal abscess. Only minimal fluid component is noted. Surrounding musculature appears within normal limits. Proximal IMPRESSION: Subcutaneous abscess in the medial aspect of the right groin and proximal right thigh containing mostly air. No large fluid collection is identified. Reactive adenopathy is identified. No involvement of the scrotum is identified. Electronically Signed   By: Alcide Clever M.D.   On: 04/21/2021 19:48   US RENAL  Result Date: 04/22/2021 CLINICAL DATA:  AK I EXAM: RENAL / URINARY TRACT ULTRASOUND COMPLETE COMPARISON:  Abdominal ultrasound 08/02/2017 FINDINGS: Right Kidney: Renal measurements: 14.0 x 5.7 x 7.3 cm = volume: 301 mL. Echogenicity within normal limits. No mass or hydronephrosis visualized. Left Kidney: Renal measurements: 12.5 x 6.5 x 7.4 cm = volume: 316 mL. Echogenicity within normal limits. No mass or hydronephrosis visualized. Bladder: Appears normal for degree of bladder distention. Other: None. IMPRESSION: Unremarkable sonographic appearance of the bilateral kidneys. Electronically Signed   By: Emmaline Kluver M.D.   On: 04/22/2021 10:14   DG Chest Port 1 View  Result Date: 04/21/2021 CLINICAL DATA:  Questionable sepsis EXAM: PORTABLE CHEST 1 VIEW COMPARISON:  08/03/2009 FINDINGS: Cardiac and mediastinal contours are within normal limits. No focal pulmonary opacity. No pleural effusion or pneumothorax. No acute osseous abnormality. IMPRESSION: No acute cardiopulmonary process. Electronically Signed   By: Wiliam Ke M.D.   On: 04/21/2021 19:39    Anti-infectives: Anti-infectives (From admission, onward)  Start     Dose/Rate Route Frequency Ordered Stop   04/23/21 0800  vancomycin (VANCOCIN) IVPB 1000 mg/200 mL premix  Status:  Discontinued        1,000 mg 200 mL/hr over 60 Minutes  Intravenous Every 24 hours 04/22/21 0254 04/22/21 0306   04/22/21 0800  vancomycin (VANCOCIN) IVPB 1000 mg/200 mL premix        1,000 mg 200 mL/hr over 60 Minutes Intravenous Every 24 hours 04/22/21 0306     04/22/21 0400  piperacillin-tazobactam (ZOSYN) IVPB 3.375 g        3.375 g 12.5 mL/hr over 240 Minutes Intravenous Every 8 hours 04/22/21 0254     04/21/21 1900  piperacillin-tazobactam (ZOSYN) IVPB 3.375 g        3.375 g 100 mL/hr over 30 Minutes Intravenous  Once 04/21/21 1846 04/21/21 2010   04/21/21 1845  vancomycin (VANCOCIN) IVPB 1000 mg/200 mL premix        1,000 mg 200 mL/hr over 60 Minutes Intravenous  Once 04/21/21 1843 04/21/21 2124   04/21/21 1845  ceFEPIme (MAXIPIME) 2 g in sodium chloride 0.9 % 100 mL IVPB  Status:  Discontinued        2 g 200 mL/hr over 30 Minutes Intravenous  Once 04/21/21 1843 04/21/21 1846   04/21/21 1815  clindamycin (CLEOCIN) IVPB 600 mg        600 mg 100 mL/hr over 30 Minutes Intravenous  Once 04/21/21 1802 04/21/21 1908       Assessment/Plan: Necrotizing fasciitis right thigh/groin  Given fever, WBC, Creatinine elevation, will return to the OR this morning for dressing change and possible debridement.  Cultures pending  I discussed the reason for this with the patient.  We discussed the risks which includes but is not limited to bleeding, on going infection, injury to surrounding structures, the need for further debridements, etc. He agrees to proceed.  LOS: 1 day    Abigail Miyamoto MD 04/23/2021

## 2021-04-23 NOTE — Progress Notes (Addendum)
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  Patient subsequently underwent initial I and D per general surgery, [facility] consulted for management for acute kidney injury on chronic kidney disease, management of diabetes, hypertension.  Patient going back to the OR today 04/23/2021 for further I and D.   Assessment & Plan:   Principal Problem:   Necrotizing fasciitis (HCC) Active Problems:   Cellulitis and abscess of right leg   DM (diabetes mellitus), type 1 with renal complications (HCC)   HTN (hypertension)   Acute renal failure superimposed on stage 2 chronic kidney disease (HCC)   Hypokalemia  #1 necrotizing fasciitis of the right groin/thigh -Questionable etiology. -Patient admitted to the general surgical service underwent incision, drainage, debridement of right thigh/groin necrotizing fasciitis with cultures obtained currently pending. -Blood cultures pending. -T-max 102.3 at 2021 hrs. 04/22/2021.  Leukocytosis with a white count of 16.1 today. -Patient currently on IV vancomycin, IV Zosyn. -Patient to go back to the OR this morning for further incision, drainage, debridement per general surgery. -Per primary team/general surgery.  2.  Acute renal failure on chronic kidney disease stage II -Initial baseline creatinine 1.3-1.5 per primary nephrologist note from 06/07/2021. -Last creatinine noted at 1.89 on 03/03/2021 from [facility] Everywhere. -Concern for possible progression of chronic kidney disease. -Acute  renal failure on chronic kidney disease likely multifactorial secondary to acute infection with necrotizing fasciitis of the right groin/thigh, concern for possible rhabdomyolysis from acute infection/injury in the setting of dehydration and in the setting of ACE inhibitor. -Patient with urine output of 1.6 L over the past 24 hours. -Renal ultrasound with unremarkable sonographic appearance of bilateral kidneys. -Urinalysis with glycosuria, nitrite negative, leukocytes negative, proteinuria. -Urine sodium ordered this morning at 13, urine creatinine at 112.45. -Check a CK level to rule out rhabdomyolysis. -Give a 500 cc bolus of normal saline x1. -Change IV fluids to normal saline at 125 cc an hour. -Patient currently on Vanco/Zosyn if continued worsening renal function may consider changing antibiotics to cefepime and linezolid per pharmacy recommendations. -Discussed with nephrology who are in agreement with current recommendations and recommended monitoring over the next 24 hours and if no improvement with renal function will reconsult for formal consultation.  3.  Poorly controlled diabetes mellitus type 1 -Patient noted to be on insulin pump being followed by endocrinology in the outpatient setting. -Hemoglobin A1c 14.4 (04/23/2021). -CBG 112 this morning. -Patient currently n.p.o. to the OR this morning and as such we will hold morning dose of Levemir. -Postop if patient is placed back on a diet we will resume Levemir 22 units twice daily.  SSI. -Continue every 4 hours CBG. -Diabetic coordinator following.  4.  Hypertension -ACE inhibitor on hold secondary to problem #2. -Placed on Norvasc 5 mg daily and uptitrate as needed for better blood pressure control. -IV hydralazine as needed.  5.  Hypokalemia -K-Dur 40 mEq p.o. x1. -Magnesium noted at 2.0.  6.  Hyponatremia -Likely secondary to volume depletion. -Improved with hydration. -Continue IV fluids.   DVT prophylaxis:  SCDs Code Status: Full Family Communication: Updated patient, mother, significant other at bedside. Disposition:   Status is: Inpatient  Remains inpatient appropriate because: Due to ongoing treatment.       Consultants:  [facility] hospitalist: Dr. Ronaldo Miyamoto 04/22/2021  Procedures:  CT pelvis 04/21/2021 Chest x-ray 04/21/2021 Renal ultrasound 04/22/2021 Incision, drainage, debridement of right thigh/groin necrotizing fasciitis per Dr. Magnus Ivan, general surgery 04/22/2021    Antimicrobials:  IV vancomycin 04/21/2021>>>> IV Zosyn 04/21/2021>>>>>   Subjective: Laying up in bed.  Denies any chest pain.  No shortness of breath.  No abdominal pain.  Getting ready to go back to the OR per general surgery.  Objective: Vitals:   04/22/21 2228 04/23/21 0018 04/23/21 0416 04/23/21 0827  BP: (!) 142/73 (!) 150/78 (!) 136/99 (!) 169/86  Pulse: (!) 109 (!) 101 (!) 101 (!) 104  Resp: Temp: (!) 101.1 F (38.4 C) 99.4 F (37.4 C) 99.7 F (37.6 C) 100.2 F (37.9 C)  TempSrc: Oral Oral Oral Oral  SpO2: 92% 97% 93% 97%  Weight:      Height:        Intake/Output Summary (Last 24 hours) at 04/23/2021 0954 Last data filed at 04/23/2021 0815 Gross per 24 hour  Intake 4254.77 ml  Output 2550 ml  Net 1704.77 ml   Filed Weights   04/21/21 1504  Weight: 111.6 kg    Examination:  General exam: Appears calm and comfortable  Respiratory system: Clear to auscultation. Respiratory effort normal. Cardiovascular system: S1 & S2 heard, RRR. No JVD, murmurs, rubs, gallops or clicks. No pedal edema. Gastrointestinal system: Abdomen is nondistended, soft and nontender. No organomegaly or masses felt. Normal bowel sounds heard. Central nervous system: Alert and oriented. No focal neurological deficits. Extremities: Right groin region with dressing intact.  Right upper thigh with some erythema, tightness, some tenderness to palpation.  Skin: No rashes, lesions or ulcers Psychiatry:  Judgement and insight appear normal. Mood & affect appropriate.     Data Reviewed: I have personally reviewed following labs and imaging studies  CBC: Recent Labs  Lab 04/21/21 1748 04/22/21 0658 04/23/21 0310  WBC 15.0* 15.9* 16.1*  NEUTROABS 12.7*  --   --   HGB 11.2* 10.9* 10.7*  HCT 32.6* 32.4* 33.2*  MCV 81.3 82.9 85.1  PLT 273 312 358    Basic Metabolic Panel: Recent Labs  Lab 04/21/21 1748 04/21/21 2135 04/22/21 0658 04/23/21 0310 04/23/21 0750  NA 126* 128* 129* 132*  --   K 3.5 3.0* 3.6 3.3*  --   CL 93* 95* 97* 102  --   CO2 --   GLUCOSE 513* 261* 217* 135*  --   BUN 31* 31* 34* 36*  --   CREATININE 2.57* 2.55* 2.59* 3.17*  --   CALCIUM 7.9* 7.7* 7.8* 7.6*  --   MG  --   --   --   --  2.0    GFR: Estimated Creatinine Clearance: 40.7 mL/min (A) (by C-G formula based on SCr of 3.17 mg/dL (H)).  Liver Function Tests: Recent Labs  Lab 04/21/21 1748  AST 44*  ALT 59*  ALKPHOS 126  BILITOT 0.7  PROT 6.3*  ALBUMIN 2.4*    CBG: Recent Labs  Lab 04/22/21 1648 04/22/21 2025 04/23/21 0014 04/23/21 0412 04/23/21 0714  GLUCAP 245* 282* 221* 112* 114*     Recent Results (from the past 240 hour(s))  Blood culture (routine x 2)  Status: None (Preliminary result)   Collection Time: 04/21/21  5:45 PM   Specimen: BLOOD  Result Value Ref Range Status   Specimen Description   Final    BLOOD BLOOD LEFT FOREARM Performed at [facility], [address] [place], [place] [address]    Special Requests   Final    BOTTLES DRAWN AEROBIC AND ANAEROBIC Blood Culture adequate volume Performed at [facility], [address] [place], [place] [address]    Culture   Final    NO GROWTH 2 DAYS Performed at [facility], [address], [place], [place] [address]    Report Status PENDING  Incomplete  Blood culture (routine x 2)     Status: None (Preliminary result)   Collection Time: 04/21/21  6:30 PM   Specimen:  BLOOD  Result Value Ref Range Status   Specimen Description   Final    BLOOD RIGHT ANTECUBITAL Performed at [facility], [address] [place], [place] [address]    Special Requests   Final    BOTTLES DRAWN AEROBIC AND ANAEROBIC Blood Culture results may not be optimal due to an excessive volume of blood received in culture bottles Performed at [facility], [address] [place], [place] [address]    Culture   Final    NO GROWTH 2 DAYS Performed at [facility], [address], [place], [place] [address]    Report Status PENDING  Incomplete  Resp Panel by RT-PCR (Flu A&B, Covid) Nasopharyngeal Swab     Status: None   Collection Time: 04/21/21  6:45 PM   Specimen: Nasopharyngeal Swab; Nasopharyngeal(NP) swabs in vial transport medium  Result Value Ref Range Status   SARS Coronavirus 2 by RT PCR NEGATIVE NEGATIVE Final    Comment: (NOTE) SARS-CoV-2 target nucleic acids are NOT DETECTED.  The SARS-CoV-2 RNA is generally detectable in upper respiratory specimens during the acute phase of infection. The lowest concentration of SARS-CoV-2 viral copies this assay can detect is 138 copies/mL. A negative result does not preclude SARS-Cov-2 infection and should not be used as the sole basis for treatment or other patient management decisions. A negative result may occur with  improper specimen collection/handling, submission of specimen other than nasopharyngeal swab, presence of viral mutation(s) within the areas targeted by this assay, and inadequate number of viral copies(<138 copies/mL). A negative result must be combined with clinical observations, patient history, and epidemiological information. The expected result is Negative.  Fact Sheet for Patients:  BloggerCourse.com  Fact Sheet for Healthcare Providers:  SeriousBroker.it  This test is no t yet approved or cleared by the [place] FDA  and  has been authorized for detection and/or diagnosis of SARS-CoV-2 by FDA under an Emergency Use Authorization (EUA). This EUA will remain  in effect (meaning this test can be used) for the duration of the COVID-19 declaration under Section 564(b)(1) of the Act, 21 U.S.C.section 360bbb-3(b)(1), unless the authorization is terminated  or revoked sooner.       Influenza A by PCR NEGATIVE NEGATIVE Final   Influenza B by PCR NEGATIVE NEGATIVE Final    Comment: (NOTE) The Xpert Xpress SARS-CoV-2/FLU/RSV plus assay is intended as an aid in the diagnosis of influenza from Nasopharyngeal swab specimens and should not be used as a sole basis for treatment. Nasal washings and aspirates are unacceptable for Xpert Xpress SARS-CoV-2/FLU/RSV testing.  Fact Sheet for Patients: BloggerCourse.com  Fact  Sheet for Healthcare Providers: SeriousBroker.it  This test is not yet approved or cleared by the [place] and has been authorized for detection and/or diagnosis of SARS-CoV-2 by FDA under an Emergency Use Authorization (EUA). This EUA will remain in effect (meaning this test can be used) for the duration of the COVID-19 declaration under Section 564(b)(1) of the Act, 21 U.S.C. section 360bbb-3(b)(1), unless the authorization is terminated or revoked.  Performed at [facility], [address] [place], [place] [address]          Radiology Studies: CT PELVIS WO CONTRAST  Result Date: 04/21/2021 CLINICAL DATA:  Groin pain for several days with palpable abnormality in inflammation EXAM: CT PELVIS WITHOUT CONTRAST TECHNIQUE: Multidetector CT imaging of the pelvis was performed following the standard protocol without intravenous contrast. COMPARISON:  None. FINDINGS: Urinary Tract:  Bladder is well distended. Bowel: The appendix is well visualized and within normal limits. Visualized colon and small bowel are within normal  limits. Vascular/Lymphatic: Vascular calcifications are noted. No aneurysmal dilatation is seen. Reactive lymph nodes are noted in the inguinal regions bilaterally right slightly greater than left. Reproductive:  Prostate is within normal limits. Other:  No free fluid is noted within the pelvis. Musculoskeletal: No acute bony abnormality is noted. In the medial right groin and proximal right thigh there is an area of inflammation with air within consistent with focal abscess. Only minimal fluid component is noted. Surrounding musculature appears within normal limits. Proximal IMPRESSION: Subcutaneous abscess in the medial aspect of the right groin and proximal right thigh containing mostly air. No large fluid collection is identified. Reactive adenopathy is identified. No involvement of the scrotum is identified. Electronically Signed   By: Alcide Clever M.D.   On: 04/21/2021 19:48   US RENAL  Result Date: 04/22/2021 CLINICAL DATA:  AK I EXAM: RENAL / URINARY TRACT ULTRASOUND COMPLETE COMPARISON:  Abdominal ultrasound 08/02/2017 FINDINGS: Right Kidney: Renal measurements: 14.0 x 5.7 x 7.3 cm = volume: 301 mL. Echogenicity within normal limits. No mass or hydronephrosis visualized. Left Kidney: Renal measurements: 12.5 x 6.5 x 7.4 cm = volume: 316 mL. Echogenicity within normal limits. No mass or hydronephrosis visualized. Bladder: Appears normal for degree of bladder distention. Other: None. IMPRESSION: Unremarkable sonographic appearance of the bilateral kidneys. Electronically Signed   By: Emmaline Kluver M.D.   On: 04/22/2021 10:14   DG Chest Port 1 View  Result Date: 04/21/2021 CLINICAL DATA:  Questionable sepsis EXAM: PORTABLE CHEST 1 VIEW COMPARISON:  08/03/2009 FINDINGS: Cardiac and mediastinal contours are within normal limits. No focal pulmonary opacity. No pleural effusion or pneumothorax. No acute osseous abnormality. IMPRESSION: No acute cardiopulmonary process. Electronically Signed   By:  Wiliam Ke M.D.   On: 04/21/2021 19:39        Scheduled Meds:  acetaminophen       [MAR Hold] amLODipine  5 mg Oral Daily   [MAR Hold] enoxaparin (LOVENOX) injection  40 mg Subcutaneous Q24H   [MAR Hold] gabapentin  300 mg Oral BID   [MAR Hold] insulin aspart  0-20 Units Subcutaneous Q4H   [MAR Hold] insulin detemir  22 Units Subcutaneous BID   [MAR Hold] sucralfate  1 g Oral TID WC & HS   Continuous Infusions:  [MAR Hold] sodium chloride 500 mL (04/22/21 1820)   sodium chloride     [MAR Hold] piperacillin-tazobactam (ZOSYN)  IV 3.375 g (04/23/21 0427)   [MAR Hold] vancomycin 1,000 mg (04/23/21 0743)  LOS: 1 day    Time spent: 40 minutes    Ramiro Harvest, MD [facility]   To contact the attending provider between 7A-7P or the covering provider during after hours 7P-7A, please log into the web site [email or web address] and access using universal  password for that web site. If you do not have the password, please call the hospital operator.  04/23/2021, 9:54 AM

## 2021-04-24 ENCOUNTER — Encounter (HOSPITAL_COMMUNITY): Payer: Self-pay | Admitting: Surgery

## 2021-04-24 LAB — RENAL FUNCTION PANEL
Albumin: 1.8 g/dL — ABNORMAL LOW (ref 3.5–5.0)
Anion gap: 7 (ref 5–15)
BUN: 35 mg/dL — ABNORMAL HIGH (ref 6–20)
CO2: 19 mmol/L — ABNORMAL LOW (ref 22–32)
Calcium: 7.9 mg/dL — ABNORMAL LOW (ref 8.9–10.3)
Chloride: 104 mmol/L (ref 98–111)
Creatinine, Ser: 1.94 mg/dL — ABNORMAL HIGH (ref 0.61–1.24)
GFR, Estimated: 45 mL/min — ABNORMAL LOW (ref >60)
Glucose, Bld: 212 mg/dL — ABNORMAL HIGH (ref 70–99)
Phosphorus: 2.6 mg/dL (ref 2.5–4.6)
Potassium: 4.7 mmol/L (ref 3.5–5.1)
Sodium: 130 mmol/L — ABNORMAL LOW (ref 135–145)

## 2021-04-24 LAB — CBC WITH DIFFERENTIAL/PLATELET
Abs Immature Granulocytes: 0.25 10*3/uL — ABNORMAL HIGH (ref 0.00–0.07)
Basophils Absolute: 0 10*3/uL (ref 0.0–0.1)
Basophils Relative: 0 %
Eosinophils Absolute: 0 10*3/uL (ref 0.0–0.5)
Eosinophils Relative: 0 %
HCT: 31.5 % — ABNORMAL LOW (ref 39.0–52.0)
Hemoglobin: 10.4 g/dL — ABNORMAL LOW (ref 13.0–17.0)
Immature Granulocytes: 2 %
Lymphocytes Relative: 11 %
Lymphs Abs: 1.6 10*3/uL (ref 0.7–4.0)
MCH: 27.7 pg (ref 26.0–34.0)
MCHC: 33 g/dL (ref 30.0–36.0)
MCV: 83.8 fL (ref 80.0–100.0)
Monocytes Absolute: 1.2 10*3/uL — ABNORMAL HIGH (ref 0.1–1.0)
Monocytes Relative: 8 %
Neutro Abs: 11.6 10*3/uL — ABNORMAL HIGH (ref 1.7–7.7)
Neutrophils Relative %: 79 %
Platelets: 336 10*3/uL (ref 150–400)
RBC: 3.76 MIL/uL — ABNORMAL LOW (ref 4.22–5.81)
RDW: 14.6 % (ref 11.5–15.5)
WBC: 14.7 10*3/uL — ABNORMAL HIGH (ref 4.0–10.5)
nRBC: 0 % (ref 0.0–0.2)

## 2021-04-24 LAB — GLUCOSE, CAPILLARY
Glucose-Capillary: 223 mg/dL — ABNORMAL HIGH (ref 70–99)
Glucose-Capillary: 190 mg/dL — ABNORMAL HIGH (ref 70–99)
Glucose-Capillary: 143 mg/dL — ABNORMAL HIGH (ref 70–99)
Glucose-Capillary: 153 mg/dL — ABNORMAL HIGH (ref 70–99)
Glucose-Capillary: 226 mg/dL — ABNORMAL HIGH (ref 70–99)
Glucose-Capillary: 244 mg/dL — ABNORMAL HIGH (ref 70–99)

## 2021-04-24 MED ORDER — SODIUM BICARBONATE 650 MG PO TABS
650.0000 mg | ORAL_TABLET | Freq: Two times a day (BID) | ORAL | Status: AC
  Administered 2021-04-24 – 2021-04-26 (×5): 650 mg via ORAL
  Filled 2021-04-24 (×6): qty 1

## 2021-04-24 MED ORDER — INSULIN ASPART 100 UNIT/ML IJ SOLN
0.0000 [IU] | Freq: Three times a day (TID) | INTRAMUSCULAR | Status: DC
  Administered 2021-04-24: 7 [IU] via SUBCUTANEOUS
  Administered 2021-04-24: 4 [IU] via SUBCUTANEOUS

## 2021-04-24 MED ORDER — SODIUM CHLORIDE 0.9 % IV SOLN
2.0000 g | Freq: Three times a day (TID) | INTRAVENOUS | Status: DC
  Administered 2021-04-24 – 2021-04-28 (×13): 2 g via INTRAVENOUS
  Filled 2021-04-24 (×13): qty 2

## 2021-04-24 MED ORDER — AMLODIPINE BESYLATE 10 MG PO TABS
10.0000 mg | ORAL_TABLET | Freq: Every day | ORAL | Status: DC
  Administered 2021-04-24 – 2021-05-01 (×8): 10 mg via ORAL
  Filled 2021-04-24 (×8): qty 1

## 2021-04-24 MED ORDER — SENNOSIDES-DOCUSATE SODIUM 8.6-50 MG PO TABS
1.0000 | ORAL_TABLET | Freq: Two times a day (BID) | ORAL | Status: DC
  Administered 2021-04-24 – 2021-04-29 (×10): 1 via ORAL
  Filled 2021-04-24 (×14): qty 1

## 2021-04-24 MED ORDER — HYDRALAZINE HCL 25 MG PO TABS
25.0000 mg | ORAL_TABLET | Freq: Two times a day (BID) | ORAL | Status: DC
  Administered 2021-04-24: 25 mg via ORAL
  Filled 2021-04-24 (×2): qty 1

## 2021-04-24 MED ORDER — HYDRALAZINE HCL 25 MG PO TABS
25.0000 mg | ORAL_TABLET | Freq: Three times a day (TID) | ORAL | Status: DC
  Administered 2021-04-24: 25 mg via ORAL
  Filled 2021-04-24: qty 1

## 2021-04-24 NOTE — Progress Notes (Signed)
Inpatient Diabetes Program Recommendations  AACE/ADA: New Consensus Statement on Inpatient Glycemic Control (2015)  Target Ranges:  Prepandial:   less than 140 mg/dL      Peak postprandial:   less than 180 mg/dL (1-2 hours)      Critically ill patients:  140 - 180 mg/dL   Lab Results  Component Value Date   GLUCAP 153 (H) 04/24/2021   HGBA1C 14.4 (H) 04/23/2021    Review of Glycemic Control  Diabetes history: DM1 Outpatient Diabetes medications: OmniPod with Novolog 2 units/H, Jardiance 25 mg QD Current orders for Inpatient glycemic control: Levemir 22 units BID, Novolog 0-20 units TID with meals  Will need meal coverage insulin HgbA1C - 14.4% - not using OmniPod d/t lost insurance  Inpatient Diabetes Program Recommendations:    Consider adding Novolog 8 units TID with meals if eating > 50%  Decrease Novolog to 0-15 units TID with meals and 0-5 HS (type 1 DM and sensitive to insulin)  Long discussion about HgbA1C of 14.4% and OmniPod use. Pt states he lost his job and could not afford the insulin pump supplies. Was using the CGM of the pump and giving himself Novolog injections SQ. Explained to him that he needs basal and meal coverage since he's a Type 1 if he is not getting continuous insulin through the pump. Pt understands and said he will need to be discharged on affordable insulin since he's without insurance. Will order [facility] consult for medication assistance.   Continue to follow.  Thank you. Ailene Ards, RD, LDN, CDE Inpatient Diabetes Coordinator [phone]

## 2021-04-24 NOTE — Progress Notes (Signed)
Patient ID: Bradley [place], male   DOB: 06-20-1986, 35 y.o.   MRN: [id number]  [facility] Surgery Progress Note  1 Day Post-Op  Subjective: CC-  Thigh is sore from surgery. WBC down 14.7  Objective: Vital signs in last 24 hours: Temp:  [98.1 F (36.7 C)-99.4 F (37.4 C)] 98.4 F (36.9 C) (10/17 0617) Pulse Rate:  [88-115] 93 (10/17 0617) Resp:  [14-20] 16 (10/17 0617) BP: (151-191)/(78-101) 175/87 (10/17 0617) SpO2:  [93 %-100 %] 95 % (10/17 0617) Last BM Date: 04/21/21  Intake/Output from previous day: 10/16 0701 - 10/17 0700 In: 3692.9 [P.O.:840; I.V.:2052.9; IV Piggyback:800] Out: 4825 [Urine:4800; Blood:25] Intake/Output this shift: No intake/output data recorded.  PE: Gen:  Alert, NAD, pleasant Pulm: rate and effort normal Abd: Soft, NT/ND Ext:  right groin wound appears very clean, some drainage noted on dressing but no active purulent drainage, induration less per patient    Lab Results:  Recent Labs    04/23/21 0310 04/24/21 0729  WBC 16.1* 14.7*  HGB 10.7* 10.4*  HCT 33.2* 31.5*  PLT 358 336   BMET Recent Labs    04/23/21 0310 04/24/21 0729  NA 132* 130*  K 3.3* 4.7  CL 102 104  CO2 22 19*  GLUCOSE 135* 212*  BUN 36* 35*  CREATININE 3.17* 1.94*  CALCIUM 7.6* 7.9*   PT/INR Recent Labs    04/21/21 1905  LABPROT 15.9*  INR 1.3*   CMP     Component Value Date/Time   NA 130 (L) 04/24/2021 0729   K 4.7 04/24/2021 0729   CL 104 04/24/2021 0729   CO2 19 (L) 04/24/2021 0729   GLUCOSE 212 (H) 04/24/2021 0729   BUN 35 (H) 04/24/2021 0729   CREATININE 1.94 (H) 04/24/2021 0729   CALCIUM 7.9 (L) 04/24/2021 0729   PROT 6.3 (L) 04/21/2021 1748   ALBUMIN 1.8 (L) 04/24/2021 0729   AST 44 (H) 04/21/2021 1748   ALT 59 (H) 04/21/2021 1748   ALKPHOS 126 04/21/2021 1748   BILITOT 0.7 04/21/2021 1748   GFRNONAA 45 (L) 04/24/2021 0729   GFRAA  02/14/2008 1530    >60        The eGFR has been calculated using the MDRD equation. This  calculation has not been validated in all clinical   Lipase  No results found for: LIPASE     Studies/Results: No results found.  Anti-infectives: Anti-infectives (From admission, onward)    Start     Dose/Rate Route Frequency Ordered Stop   04/24/21 1000  ceFEPIme (MAXIPIME) 2 g in sodium chloride 0.9 % 100 mL IVPB        2 g 200 mL/hr over 30 Minutes Intravenous Every 8 hours 04/24/21 0925     04/23/21 1400  linezolid (ZYVOX) IVPB 600 mg       Note to Pharmacy: Pharm to adjust dose   600 mg 300 mL/hr over 60 Minutes Intravenous Every 12 hours 04/23/21 1123     04/23/21 1300  ceFEPIme (MAXIPIME) 2 g in sodium chloride 0.9 % 100 mL IVPB  Status:  Discontinued        2 g 200 mL/hr over 30 Minutes Intravenous Every 12 hours 04/23/21 1236 04/24/21 0925   04/23/21 0800  vancomycin (VANCOCIN) IVPB 1000 mg/200 mL premix  Status:  Discontinued        1,000 mg 200 mL/hr over 60 Minutes Intravenous Every 24 hours 04/22/21 0254 04/22/21 0306   04/22/21 0800  vancomycin (VANCOCIN) IVPB 1000 mg/200  mL premix  Status:  Discontinued        1,000 mg 200 mL/hr over 60 Minutes Intravenous Every 24 hours 04/22/21 0306 04/23/21 1123   04/22/21 0400  piperacillin-tazobactam (ZOSYN) IVPB 3.375 g  Status:  Discontinued        3.375 g 12.5 mL/hr over 240 Minutes Intravenous Every 8 hours 04/22/21 0254 04/23/21 1123   04/21/21 1900  piperacillin-tazobactam (ZOSYN) IVPB 3.375 g        3.375 g 100 mL/hr over 30 Minutes Intravenous  Once 04/21/21 1846 04/21/21 2010   04/21/21 1845  vancomycin (VANCOCIN) IVPB 1000 mg/200 mL premix        1,000 mg 200 mL/hr over 60 Minutes Intravenous  Once 04/21/21 1843 04/21/21 2124   04/21/21 1845  ceFEPIme (MAXIPIME) 2 g in sodium chloride 0.9 % 100 mL IVPB  Status:  Discontinued        2 g 200 mL/hr over 30 Minutes Intravenous  Once 04/21/21 1843 04/21/21 1846   04/21/21 1815  clindamycin (CLEOCIN) IVPB 600 mg        600 mg 100 mL/hr over 30 Minutes Intravenous   Once 04/21/21 1802 04/21/21 1908        Assessment/Plan Necrotizing fascitis -POD#1&2 for IRRIGATION AND DEBRIDEMENT RIGHT GROIN WOUND; RIGHT THIGH  10/15 and 10/16 Dr. Ninfa [place] - culture pending - Wound actually looks very clean today, induration improving, WBC trending down. No plans for surgery today. Will start BID wet to dry dressing changes. Shower with wound open. Continue broad spectrum antibiotics and follow cultures. Monitor closely.   ID - currently maxipime and linezolid FEN - CM diet VTE - lovenox Foley - none  IDDM HTN CKD BMI 35.30   LOS: 2 days    Wellington Hampshire, PA-C [facility] 04/24/2021, 10:00 AM Please see Amion for pager number during day hours 7:00am-4:30pm

## 2021-04-24 NOTE — Plan of Care (Signed)
Plan of care reviewed and discussed with the patient. 

## 2021-04-24 NOTE — Progress Notes (Signed)
Pharmacy Antibiotic Note  Bradley [place] is a 35 y.o. male admitted on 04/21/2021 with  necrotizing fasciitis  of right groin/thigh.  Has been on clindamycin as outpatient with worsening pain.  S/p I&D 10/15 & repeated 10/16. Pharmacy was consulted for cefepime.   As Scr has trended up, pt is being transitioned to cefepime and linezolid.  10/17: SCr improved to 1.94,   Plan: Linezolid 600 mg IV q12h  Cefepime 2 gr IV q12h   Height: 5\' 10"  (177.8 cm) Weight: 111.6 kg (246 lb) IBW/kg (Calculated) : 73  Temp (24hrs), Avg:98.7 F (37.1 C), Min:98.1 F (36.7 C), Max:99.4 F (37.4 C)  Recent Labs  Lab 04/21/21 1748 04/21/21 2023 04/21/21 2135 04/22/21 0658 04/23/21 0310 04/24/21 0729  WBC 15.0*  --   --  15.9* 16.1*  --   CREATININE 2.57*  --  2.55* 2.59* 3.17* 1.94*  LATICACIDVEN 2.6* 2.3*  --   --   --   --      Estimated Creatinine Clearance: 66.5 mL/min (A) (by C-G formula based on SCr of 1.94 mg/dL (H)).    Allergies  Allergen Reactions   Ibuprofen Swelling    Pt reports taking after reported allergy w/ no reactions    Antimicrobials this admission: 10/14 Vancomycin >> 10/16  10/14 Zosyn >> 10/16  10/14 Clindamycin x1 (was on PTA) 10/16 linezolid >>  10/16 cefepime >>   Dose adjustments this admission: 10/17 Cefepime 2 gr IV q12h > q8  Microbiology results: 10/14 BCx: NGTD 10/14 UCx: ng-final 10/15 Abscess cx: abundant GPC, abundant GNR   Thank you for allowing pharmacy to be a part of this patient's care.  11/15 PharmD [facility] [phone] 04/24/2021, 9:28 AM

## 2021-04-24 NOTE — Progress Notes (Signed)
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  Patient subsequently underwent initial I and D per general surgery, TRH consulted for management for acute kidney injury on chronic kidney disease, management of diabetes, hypertension.  Patient going back to the OR today 04/23/2021 for further I and D.   Assessment & Plan:   Principal Problem:   Necrotizing fasciitis (HCC) Active Problems:   Cellulitis and abscess of right leg   DM (diabetes mellitus), type 1 with renal complications (HCC)   HTN (hypertension)   Acute renal failure superimposed on stage 2 chronic kidney disease (HCC)   Hypokalemia   Abscess of groin  #1 necrotizing fasciitis of the right groin/thigh -Questionable etiology. -Patient admitted to the general surgical service underwent incision, drainage, debridement of right thigh/groin necrotizing fasciitis with cultures obtained currently pending. -Blood cultures pending. -Fever curve trending down after second irrigation and debridement and change of antibiotics.  -Leukocytosis trending down. -Patient was taken back to the OR and underwent irrigation and debridement of the right groin wound, right thigh with potential further debridement.   -IV Vanco and IV Zosyn changed to IV cefepime and IV Zyvox due to acute on chronic renal failure.  -Per primary team/general surgery.  2.  Acute renal failure on chronic kidney disease stage II -Initial baseline creatinine 1.3-1.5 per primary nephrologist note  from 06/07/2021. -Last creatinine noted at 1.89 on 03/03/2021 from [facility] Everywhere. -Concern for possible progression of chronic kidney disease. -Acute renal failure on chronic kidney disease likely multifactorial secondary to acute infection with necrotizing fasciitis of the right groin/thigh, concern for possible rhabdomyolysis from acute infection/injury in the setting of dehydration and in the setting of ACE inhibitor. -Patient with urine output of 4.8 L over the past 24 hours. -Renal ultrasound with unremarkable sonographic appearance of bilateral kidneys. -Urinalysis with glycosuria, nitrite negative, leukocytes negative, proteinuria. -Urine sodium at 13, urine creatinine at 112.45. -CK level at 769.   -Received a 500 cc bolus of normal saline on 04/23/2021 and IV fluids increased to 150 cc an hour.   -Creatinine currently at 1.94 this morning.   -Decrease IV fluids to 125 cc an hour.   -Was on IV vancomycin, Zosyn with continued worsening of renal function and that has been discontinued and patient currently on IV Zyvox and cefepime.  -Discussed with nephrology. -Follow.  3.  Poorly controlled diabetes mellitus type 1 -Patient noted to be on insulin pump being followed by endocrinology in the outpatient setting. -Hemoglobin A1c 14.4 (04/23/2021). -CBG 190 this morning. -Patient placed back on diet and resume Levemir 22 units twice daily and uptitrate as needed for better blood pressure control.   -Continue SSI.   -Change CBGs to [facility] at bedtime.   -Diabetes coordinator following.   4.  Hypertension -ACE inhibitor on hold secondary to problem #2. -Increase Norvasc to 10 mg daily.   -Hydralazine 25 mg p.o. twice daily.   -IV hydralazine as needed.    5.  Hypokalemia -Potassium repleted currently of 4.7. -Magnesium noted at 2.0.  6.  Hyponatremia -Likely secondary to volume depletion. -Improved with hydration. -Continue IV fluids, follow.   DVT prophylaxis: SCDs Code  Status: Full Family Communication: Updated patient. No family at bedside. Disposition:   Status is: Inpatient  Remains inpatient appropriate because: Due to ongoing treatment.       Consultants:  [facility] hospitalist: Dr. Ronaldo Miyamoto 04/22/2021  Procedures:  CT pelvis 04/21/2021 Chest x-ray 04/21/2021 Renal ultrasound 04/22/2021 Incision, drainage, debridement of right thigh/groin necrotizing fasciitis per Dr. Magnus Ivan, general surgery 04/22/2021 Irrigation and debridement right groin wound, right thigh and potential further debridement per Dr. Magnus Ivan 04/23/2021    Antimicrobials:  IV vancomycin 04/21/2021>>>> 04/23/2021 IV Zosyn 04/21/2021>>>>> 04/23/2021 IV cefepime 04/23/2021>>>> IV Zyvox 04/23/2021>>>>>>   Subjective: Patient sitting up in chair.  No chest pain.  No shortness of breath.  No abdominal pain.  Right groin/right thigh pain with some improvement.  However states unsure of pain level at this time as dressing changes were just done.   Objective: Vitals:   04/23/21 1837 04/23/21 2129 04/24/21 0134 04/24/21 0617  BP: (!) 177/101 (!) 191/91 (!) 151/78 (!) 175/87  Pulse: 89 98 93 93  Resp: 18 18 16 16   Temp: 98.1 F (36.7 C) 99.4 F (37.4 C) 98.8 F (37.1 C) 98.4 F (36.9 C)  TempSrc: Oral Oral Oral Oral  SpO2: 100% 100% 96% 95%  Weight:      Height:        Intake/Output Summary (Last 24 hours) at 04/24/2021 1119 Last data filed at 04/24/2021 0600 Gross per 24 hour  Intake 3692.91 ml  Output 3850 ml  Net -157.09 ml    Filed Weights   04/21/21 1504  Weight: 111.6 kg    Examination:  General exam: NAD Respiratory system: CTA B.  No wheezes, no crackles, no rhonchi.  Normal respiratory effort. Cardiovascular system: RRR no murmurs rubs or gallops.  No JVD.  No lower extremity edema. Gastrointestinal system: Abdomen is soft, nontender, nondistended, positive bowel sounds.  No rebound.  No guarding.  Central nervous system: Alert and oriented. No  focal neurological deficits. Extremities: Right groin region with dressing intact.  Right upper thigh with with some erythema, less tight, less tender to palpation.  Skin: No rashes, lesions or ulcers Psychiatry: Judgement and insight appear normal. Mood & affect appropriate.     Data Reviewed: I have personally reviewed following labs and imaging studies  CBC: Recent Labs  Lab 04/21/21 1748 04/22/21 0658 04/23/21 0310 04/24/21 0729  WBC 15.0* 15.9* 16.1* 14.7*  NEUTROABS 12.7*  --   --  11.6*  HGB 11.2* 10.9* 10.7* 10.4*  HCT 32.6* 32.4* 33.2* 31.5*  MCV 81.3 82.9 85.1 83.8  PLT 273 312 358 336     Basic Metabolic Panel: Recent Labs  Lab 04/21/21 1748 04/21/21 2135 04/22/21 0658 04/23/21 0310 04/23/21 0750 04/24/21 0729  NA 126* 128* 129* 132*  --  130*  K 3.5 3.0* 3.6 3.3*  --  4.7  CL 93* 95* 97* 102  --  104  CO2 22 23 22 22   --  19*  GLUCOSE 513* 261* 217* 135*  --  212*  BUN 31* 31* 34* 36*  --  35*  CREATININE 2.57* 2.55* 2.59* 3.17*  --  1.94*  CALCIUM 7.9* 7.7* 7.8* 7.6*  --  7.9*  MG  --   --   --   --  2.0  --   PHOS  --   --   --   --   --  2.6     GFR: Estimated Creatinine Clearance: 66.5 mL/min (A) (by C-G formula based on SCr of 1.94 mg/dL (H)).  Liver Function Tests: Recent Labs  Lab 04/21/21 1748 04/24/21 0729  AST 44*  --   ALT 59*  --   ALKPHOS 126  --   BILITOT 0.7  --   PROT 6.3*  --   ALBUMIN 2.4* 1.8*     CBG: Recent Labs  Lab 04/23/21 1559 04/23/21 1959 04/23/21 2354 04/24/21 0348 04/24/21 0734  GLUCAP 238* 270* 291* 223* 190*      Recent Results (from the past 240 hour(s))  Blood culture (routine x 2)     Status: None (Preliminary result)   Collection Time: 04/21/21  5:45 PM   Specimen: BLOOD  Result Value Ref Range Status   Specimen Description   Final    BLOOD BLOOD LEFT FOREARM Performed at [facility], [address] [place], [place] [address]    Special Requests   Final    BOTTLES DRAWN  AEROBIC AND ANAEROBIC Blood Culture adequate volume Performed at [facility], [address] [place], [place] [address]    Culture   Final    NO GROWTH 3 DAYS Performed at [facility], [address] [place], [place] [address]    Report Status PENDING  Incomplete  Blood culture (routine x 2)     Status: None (Preliminary result)   Collection Time: 04/21/21  6:30 PM   Specimen: BLOOD  Result Value Ref Range Status   Specimen Description   Final    BLOOD RIGHT ANTECUBITAL Performed at [facility], [address] [place], [place] [address]    Special Requests   Final    BOTTLES DRAWN AEROBIC AND ANAEROBIC Blood Culture results may not be optimal due to an excessive volume of blood received in culture bottles Performed at [facility], [address] [place], [place] [address]    Culture   Final    NO GROWTH 3 DAYS Performed at [facility], [address], [place], [place] [address]    Report Status PENDING  Incomplete  Resp Panel by RT-PCR (Flu A&B, Covid) Nasopharyngeal Swab     Status: None   Collection Time: 04/21/21  6:45 PM   Specimen: Nasopharyngeal Swab; Nasopharyngeal(NP) swabs in vial transport medium  Result Value Ref Range Status   SARS Coronavirus 2 by RT PCR NEGATIVE NEGATIVE Final    Comment: (NOTE) SARS-CoV-2 target nucleic acids are NOT DETECTED.  The SARS-CoV-2 RNA is generally detectable in upper respiratory specimens during the acute phase of infection. The lowest concentration of SARS-CoV-2 viral copies this assay can detect is 138 copies/mL. A negative result does not preclude SARS-Cov-2 infection and should not be used as the sole basis for treatment or other patient management decisions. A negative result may occur with  improper specimen collection/handling, submission of specimen other than nasopharyngeal swab, presence of viral mutation(s) within the areas targeted by this assay, and inadequate number  of viral copies(<138 copies/mL). A negative result must be combined with clinical observations, patient history, and epidemiological information. The expected result is Negative.  Fact Sheet for Patients:  BloggerCourse.com  Fact Sheet for Healthcare Providers:  SeriousBroker.it  This test is no t yet approved or cleared by the [place] FDA and  has been authorized for detection and/or diagnosis of SARS-CoV-2 by FDA under an Emergency Use Authorization (  EUA). This EUA will remain  in effect (meaning this test can be used) for the duration of the COVID-19 declaration under Section 564(b)(1) of the Act, 21 U.S.C.section 360bbb-3(b)(1), unless the authorization is terminated  or revoked sooner.       Influenza A by PCR NEGATIVE NEGATIVE Final   Influenza B by PCR NEGATIVE NEGATIVE Final    Comment: (NOTE) The Xpert Xpress SARS-CoV-2/FLU/RSV plus assay is intended as an aid in the diagnosis of influenza from Nasopharyngeal swab specimens and should not be used as a sole basis for treatment. Nasal washings and aspirates are unacceptable for Xpert Xpress SARS-CoV-2/FLU/RSV testing.  Fact Sheet for Patients: BloggerCourse.com  Fact Sheet for Healthcare Providers: SeriousBroker.it  This test is not yet approved or cleared by the [place] FDA and has been authorized for detection and/or diagnosis of SARS-CoV-2 by FDA under an Emergency Use Authorization (EUA). This EUA will remain in effect (meaning this test can be used) for the duration of the COVID-19 declaration under Section 564(b)(1) of the Act, 21 U.S.C. section 360bbb-3(b)(1), unless the authorization is terminated or revoked.  Performed at [facility], [address] [place], [place] [address]   Urine Culture     Status: None   Collection Time: 04/21/21 10:25 PM   Specimen: In/Out Cath Urine   Result Value Ref Range Status   Specimen Description   Final    IN/OUT CATH URINE Performed at [facility], [address] [place], [place] [address]    Special Requests   Final    NONE Performed at [facility], [address] [place], [place] [address]    Culture   Final    NO GROWTH Performed at [facility], [address], [place], [place] [address]    Report Status 04/23/2021 FINAL  Final  Aerobic/Anaerobic Culture w Gram Stain (surgical/deep wound)     Status: None (Preliminary result)   Collection Time: 04/22/21 12:29 AM   Specimen: Abscess  Result Value Ref Range Status   Specimen Description   Final    ABSCESS Performed at [facility], [address] [place], [place] [address]    Special Requests   Final    NONE Performed at [facility], [address] [place], [place] [address]    Gram Stain   Final    FEW WBC PRESENT,BOTH PMN AND MONONUCLEAR ABUNDANT GRAM POSITIVE COCCI IN CLUSTERS IN TETRADS ABUNDANT GRAM NEGATIVE RODS    Culture   Final    NO GROWTH 1 DAY Performed at [facility], [address] [place], [place] [address]    Report Status PENDING  Incomplete          Radiology Studies: No results found.      Scheduled Meds:  amLODipine  10 mg Oral Daily   enoxaparin (LOVENOX) injection  40 mg Subcutaneous Q24H   gabapentin  300 mg Oral BID   hydrALAZINE  25 mg Oral BID   insulin aspart  0-20 Units Subcutaneous TID WC   insulin detemir  22 Units Subcutaneous BID   senna-docusate  1 tablet Oral BID   sodium bicarbonate  650 mg Oral BID   sucralfate  1 g Oral TID WC & HS   Continuous Infusions:  sodium chloride 500 mL (04/22/21 1820)   sodium chloride 150 mL/hr at 04/24/21 0613   ceFEPime (MAXIPIME) IV 2 g (04/24/21 1001)   linezolid (  ZYVOX) IV 600 mg (04/24/21 0338)     LOS: 2 days    Time spent: 40 minutes    Ramiro Harvest, MD [facility]   To contact the attending provider between 7A-7P or the covering provider during after hours 7P-7A, please log into the web site [email or web address] and access using universal [place] password for that web site. If you do not have the password, please call the hospital operator.  04/24/2021, 11:19 AM

## 2021-04-25 LAB — CBC WITH DIFFERENTIAL/PLATELET
Abs Immature Granulocytes: 0.36 10*3/uL — ABNORMAL HIGH (ref 0.00–0.07)
Basophils Absolute: 0.1 10*3/uL (ref 0.0–0.1)
Basophils Relative: 1 %
Eosinophils Absolute: 0.2 10*3/uL (ref 0.0–0.5)
Eosinophils Relative: 1 %
HCT: 31.6 % — ABNORMAL LOW (ref 39.0–52.0)
Hemoglobin: 10.1 g/dL — ABNORMAL LOW (ref 13.0–17.0)
Immature Granulocytes: 3 %
Lymphocytes Relative: 24 %
Lymphs Abs: 3.2 10*3/uL (ref 0.7–4.0)
MCH: 27.4 pg (ref 26.0–34.0)
MCHC: 32 g/dL (ref 30.0–36.0)
MCV: 85.6 fL (ref 80.0–100.0)
Monocytes Absolute: 1.1 10*3/uL — ABNORMAL HIGH (ref 0.1–1.0)
Monocytes Relative: 9 %
Neutro Abs: 8.3 10*3/uL — ABNORMAL HIGH (ref 1.7–7.7)
Neutrophils Relative %: 62 %
Platelets: 460 10*3/uL — ABNORMAL HIGH (ref 150–400)
RBC: 3.69 MIL/uL — ABNORMAL LOW (ref 4.22–5.81)
RDW: 14.8 % (ref 11.5–15.5)
WBC: 13.2 10*3/uL — ABNORMAL HIGH (ref 4.0–10.5)
nRBC: 0 % (ref 0.0–0.2)

## 2021-04-25 LAB — RENAL FUNCTION PANEL
Albumin: 2.2 g/dL — ABNORMAL LOW (ref 3.5–5.0)
Anion gap: 8 (ref 5–15)
BUN: 27 mg/dL — ABNORMAL HIGH (ref 6–20)
CO2: 19 mmol/L — ABNORMAL LOW (ref 22–32)
Calcium: 7.9 mg/dL — ABNORMAL LOW (ref 8.9–10.3)
Chloride: 106 mmol/L (ref 98–111)
Creatinine, Ser: 1.71 mg/dL — ABNORMAL HIGH (ref 0.61–1.24)
GFR, Estimated: 53 mL/min — ABNORMAL LOW (ref >60)
Glucose, Bld: 178 mg/dL — ABNORMAL HIGH (ref 70–99)
Phosphorus: 2.3 mg/dL — ABNORMAL LOW (ref 2.5–4.6)
Potassium: 3.9 mmol/L (ref 3.5–5.1)
Sodium: 133 mmol/L — ABNORMAL LOW (ref 135–145)

## 2021-04-25 LAB — GLUCOSE, CAPILLARY
Glucose-Capillary: 108 mg/dL — ABNORMAL HIGH (ref 70–99)
Glucose-Capillary: 71 mg/dL (ref 70–99)
Glucose-Capillary: 127 mg/dL — ABNORMAL HIGH (ref 70–99)
Glucose-Capillary: 162 mg/dL — ABNORMAL HIGH (ref 70–99)

## 2021-04-25 MED ORDER — ACETAMINOPHEN 500 MG PO TABS
1000.0000 mg | ORAL_TABLET | Freq: Four times a day (QID) | ORAL | Status: DC
  Administered 2021-04-25 – 2021-05-01 (×20): 1000 mg via ORAL
  Filled 2021-04-25 (×21): qty 2

## 2021-04-25 MED ORDER — INSULIN ASPART 100 UNIT/ML IJ SOLN
0.0000 [IU] | Freq: Three times a day (TID) | INTRAMUSCULAR | Status: DC
  Administered 2021-04-25: 2 [IU] via SUBCUTANEOUS
  Administered 2021-04-26 – 2021-04-27 (×2): 3 [IU] via SUBCUTANEOUS
  Administered 2021-04-27: 2 [IU] via SUBCUTANEOUS
  Administered 2021-04-28: 3 [IU] via SUBCUTANEOUS
  Administered 2021-04-29: 5 [IU] via SUBCUTANEOUS
  Administered 2021-04-29 (×2): 3 [IU] via SUBCUTANEOUS
  Administered 2021-04-30: 2 [IU] via SUBCUTANEOUS
  Administered 2021-04-30: 5 [IU] via SUBCUTANEOUS
  Administered 2021-05-01: 8 [IU] via SUBCUTANEOUS

## 2021-04-25 MED ORDER — NAPHAZOLINE-GLYCERIN 0.012-0.2 % OP SOLN: 1.0000 [drp] | Freq: Four times a day (QID) | OPHTHALMIC | Status: DC | PRN

## 2021-04-25 MED ORDER — SORBITOL 70 % SOLN
30.0000 mL | Status: AC
  Administered 2021-04-25: 30 mL via ORAL
  Filled 2021-04-25 (×2): qty 30

## 2021-04-25 MED ORDER — NAPHAZOLINE-GLYCERIN 0.012-0.25 % OP SOLN
1.0000 [drp] | Freq: Four times a day (QID) | OPHTHALMIC | Status: DC | PRN
  Filled 2021-04-25: qty 15

## 2021-04-25 MED ORDER — HYDRALAZINE HCL 20 MG/ML IJ SOLN
10.0000 mg | Freq: Four times a day (QID) | INTRAMUSCULAR | Status: DC | PRN
  Administered 2021-04-26: 10 mg via INTRAVENOUS
  Filled 2021-04-25: qty 1

## 2021-04-25 MED ORDER — HYDRALAZINE HCL 20 MG/ML IJ SOLN
10.0000 mg | Freq: Once | INTRAMUSCULAR | Status: AC
  Administered 2021-04-25: 10 mg via INTRAVENOUS
  Filled 2021-04-25: qty 1

## 2021-04-25 MED ORDER — HYDROMORPHONE HCL 1 MG/ML IJ SOLN
0.5000 mg | INTRAMUSCULAR | Status: DC | PRN
  Administered 2021-04-26 – 2021-04-28 (×5): 0.5 mg via INTRAVENOUS
  Filled 2021-04-25 (×5): qty 0.5

## 2021-04-25 MED ORDER — INSULIN ASPART 100 UNIT/ML IJ SOLN: 4.0000 [IU] | Freq: Three times a day (TID) | INTRAMUSCULAR | Status: DC

## 2021-04-25 MED ORDER — ENSURE ENLIVE PO LIQD
237.0000 mL | Freq: Two times a day (BID) | ORAL | Status: DC
  Administered 2021-04-25 – 2021-04-30 (×8): 237 mL via ORAL

## 2021-04-25 MED ORDER — HYDRALAZINE HCL 50 MG PO TABS
50.0000 mg | ORAL_TABLET | Freq: Three times a day (TID) | ORAL | Status: DC
  Administered 2021-04-25 – 2021-04-27 (×7): 50 mg via ORAL
  Filled 2021-04-25 (×7): qty 1

## 2021-04-25 MED ORDER — ALBUMIN HUMAN 25 % IV SOLN
25.0000 g | Freq: Once | INTRAVENOUS | Status: AC
  Administered 2021-04-25: 25 g via INTRAVENOUS
  Filled 2021-04-25: qty 100

## 2021-04-25 MED ORDER — FUROSEMIDE 10 MG/ML IJ SOLN
20.0000 mg | Freq: Once | INTRAMUSCULAR | Status: AC
  Administered 2021-04-25: 20 mg via INTRAVENOUS
  Filled 2021-04-25: qty 2

## 2021-04-25 NOTE — Progress Notes (Signed)
2 Days Post-Op  Subjective: CC: Having some soreness of the surgical site that is well controlled at rest but increases with ambulation. Pain medications are helping. Tolerating diet without n/v.   Thinks his mom and friend will be able to help with dressing changes at home.   Objective: Vital signs in last 24 hours: Temp:  [98.7 F (37.1 C)-99.6 F (37.6 C)] 99.6 F (37.6 C) (10/18 0616) Pulse Rate:  [81-100] 100 (10/18 0616) Resp:  [18] 18 (10/18 0616) BP: (171-190)/(90-99) 171/96 (10/18 0616) SpO2:  [96 %-97 %] 96 % (10/18 0616) Last BM Date: 04/21/21  Intake/Output from previous day: 10/17 0701 - 10/18 0700 In: 2938.8 [P.O.:720; I.V.:1318.8; IV Piggyback:900] Out: 4200 [Urine:4200] Intake/Output this shift: Total I/O In: -  Out: 825 [Urine:825]  PE: Gen:  Alert, NAD, pleasant Pulm: rate and effort normal Abd: Soft, NT/ND Ext:  right groin wound appears very clean, some drainage noted on dressing but no active purulent drainage. Wound measures ~15cm with ~5cm of tracking at medial inferior edge. There is edema and induration of the thigh that is less per patient    Lab Results:  Recent Labs    04/24/21 0729 04/25/21 0325  WBC 14.7* 13.2*  HGB 10.4* 10.1*  HCT 31.5* 31.6*  PLT 336 460*   BMET Recent Labs    04/24/21 0729 04/25/21 0325  NA 130* 133*  K 4.7 3.9  CL 104 106  CO2 19* 19*  GLUCOSE 212* 178*  BUN 35* 27*  CREATININE 1.94* 1.71*  CALCIUM 7.9* 7.9*   PT/INR No results for input(s): LABPROT, INR in the last 72 hours. CMP     Component Value Date/Time   NA 133 (L) 04/25/2021 0325   K 3.9 04/25/2021 0325   CL 106 04/25/2021 0325   CO2 19 (L) 04/25/2021 0325   GLUCOSE 178 (H) 04/25/2021 0325   BUN 27 (H) 04/25/2021 0325   CREATININE 1.71 (H) 04/25/2021 0325   CALCIUM 7.9 (L) 04/25/2021 0325   PROT 6.3 (L) 04/21/2021 1748   ALBUMIN 2.2 (L) 04/25/2021 0325   AST 44 (H) 04/21/2021 1748   ALT 59 (H) 04/21/2021 1748   ALKPHOS 126  04/21/2021 1748   BILITOT 0.7 04/21/2021 1748   GFRNONAA 53 (L) 04/25/2021 0325   GFRAA  02/14/2008 1530    >60        The eGFR has been calculated using the MDRD equation. This calculation has not been validated in all clinical   Lipase  No results found for: LIPASE  Studies/Results: No results found.  Anti-infectives: Anti-infectives (From admission, onward)    Start     Dose/Rate Route Frequency Ordered Stop   04/24/21 1000  ceFEPIme (MAXIPIME) 2 g in sodium chloride 0.9 % 100 mL IVPB        2 g 200 mL/hr over 30 Minutes Intravenous Every 8 hours 04/24/21 0925     04/23/21 1400  linezolid (ZYVOX) IVPB 600 mg       Note to Pharmacy: Pharm to adjust dose   600 mg 300 mL/hr over 60 Minutes Intravenous Every 12 hours 04/23/21 1123     04/23/21 1300  ceFEPIme (MAXIPIME) 2 g in sodium chloride 0.9 % 100 mL IVPB  Status:  Discontinued        2 g 200 mL/hr over 30 Minutes Intravenous Every 12 hours 04/23/21 1236 04/24/21 0925   04/23/21 0800  vancomycin (VANCOCIN) IVPB 1000 mg/200 mL premix  Status:  Discontinued  1,000 mg 200 mL/hr over 60 Minutes Intravenous Every 24 hours 04/22/21 0254 04/22/21 0306   04/22/21 0800  vancomycin (VANCOCIN) IVPB 1000 mg/200 mL premix  Status:  Discontinued        1,000 mg 200 mL/hr over 60 Minutes Intravenous Every 24 hours 04/22/21 0306 04/23/21 1123   04/22/21 0400  piperacillin-tazobactam (ZOSYN) IVPB 3.375 g  Status:  Discontinued        3.375 g 12.5 mL/hr over 240 Minutes Intravenous Every 8 hours 04/22/21 0254 04/23/21 1123   04/21/21 1900  piperacillin-tazobactam (ZOSYN) IVPB 3.375 g        3.375 g 100 mL/hr over 30 Minutes Intravenous  Once 04/21/21 1846 04/21/21 2010   04/21/21 1845  vancomycin (VANCOCIN) IVPB 1000 mg/200 mL premix        1,000 mg 200 mL/hr over 60 Minutes Intravenous  Once 04/21/21 1843 04/21/21 2124   04/21/21 1845  ceFEPIme (MAXIPIME) 2 g in sodium chloride 0.9 % 100 mL IVPB  Status:  Discontinued        2  g 200 mL/hr over 30 Minutes Intravenous  Once 04/21/21 1843 04/21/21 1846   04/21/21 1815  clindamycin (CLEOCIN) IVPB 600 mg        600 mg 100 mL/hr over 30 Minutes Intravenous  Once 04/21/21 1802 04/21/21 1908        Assessment/Plan Necrotizing fascitis -POD#2&3 for IRRIGATION AND DEBRIDEMENT RIGHT GROIN WOUND; RIGHT THIGH  10/15 and 10/16 Dr. Ninfa Linden - culture pending - Wound clean, induration improving, WBC downtrending. No plans for further surgery  - Cont BID wet to dry dressing changes. Shower with wound open.  - Continue broad spectrum antibiotics and follow cultures.  - Maximize blood sugar control to aid in healing process. CBG's are better today - Work on transitioning to oral meds only with dressing changes - Patient is going to contact family/friend that is going to help with dressing changes at home and will try to have them come in tomorrow for teaching - Will write for [place] toilet  ID - currently maxipime and linezolid FEN - CM diet VTE - lovenox Foley - none   IDDM - will need medication assistance for insulin at discharge HTN CKD BMI 35.30   LOS: 3 days    Jillyn Ledger , [facility] 04/25/2021, 8:47 AM Please see Amion for pager number during day hours 7:00am-4:30pm

## 2021-04-25 NOTE — Plan of Care (Signed)
Plan of care reviewed and discussed with the patient. 

## 2021-04-25 NOTE — Progress Notes (Signed)
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  Patient subsequently underwent initial I and D per general surgery, TRH consulted for management for acute kidney injury on chronic kidney disease, management of diabetes, hypertension.  Patient going back to the OR today 04/23/2021 for further I and D.   Assessment & Plan:   Principal Problem:   Necrotizing fasciitis (HCC) Active Problems:   Cellulitis and abscess of right leg   DM (diabetes mellitus), type 1 with renal complications (HCC)   HTN (hypertension)   Acute renal failure superimposed on stage 2 chronic kidney disease (HCC)   Hypokalemia   Abscess of groin  1 necrotizing fasciitis of the right groin/thigh -Questionable etiology. -Patient admitted to the general surgical service underwent incision, drainage, debridement of right thigh/groin necrotizing fasciitis with cultures obtained currently pending. -Blood cultures pending. -Fever curve trending down after second irrigation and debridement and change of antibiotics.  -Leukocytosis trending down. -Patient was taken back to the OR and underwent irrigation and debridement of the right groin wound, right thigh with potential further debridement on 04/23/2021.   -IV Vanco and IV Zosyn changed to IV cefepime and IV Zyvox due to acute on chronic renal failure.  -Per primary team/general surgery.  2.  Acute renal failure on chronic kidney disease stage II -Initial baseline creatinine 1.3-1.5 per primary  nephrologist note from 06/07/2021. -Last creatinine noted at 1.89 on 03/03/2021 from Care Everywhere. -Concern for possible progression of chronic kidney disease. -Acute renal failure on chronic kidney disease likely multifactorial secondary to acute infection with necrotizing fasciitis of the right groin/thigh, concern for possible rhabdomyolysis from acute infection/injury in the setting of dehydration and in the setting of ACE inhibitor. -Patient with urine output of 4.2 L over the past 24 hours. -Renal ultrasound with unremarkable sonographic appearance of bilateral kidneys. -Urinalysis with glycosuria, nitrite negative, leukocytes negative, proteinuria. -Urine sodium at 13, urine creatinine at 112.45. -CK level at 769.   -Received a 500 cc bolus of normal saline on 04/23/2021 and IV fluids increased to 150 cc an hour.   -Creatinine currently at 1.71 this morning.   -Saline lock IV fluids.   -Due to concern for volume overload and hypoalbuminemia we will give a dose of IV albumin followed by Lasix 20 mg x 1.  -Was on IV vancomycin, Zosyn with continued worsening of renal function and that has been discontinued and patient currently on IV Zyvox and cefepime.  -Follow.  3.  Poorly controlled diabetes mellitus type 1 -Patient noted to be on insulin pump being followed by endocrinology in the outpatient setting. -Hemoglobin A1c 14.4 (04/23/2021). -CBG 108 this morning, however as low as 71 this morning and patient asymptomatic. -Patient placed back on diet and currently on Levemir 22 units twice daily.  -Discontinue meal coverage NovoLog.   -SSI.   -Diabetes coordinator following.   4.  Hypertension -ACE inhibitor on hold secondary to problem #2. -Norvasc increased to 10 mg daily.   -BP elevated and as such we will increase hydralazine to 50 mg  p.o. 3 times daily.   -Pain management.   -Patient to receive a dose of Lasix 20 mg IV x1 today.   -Hydralazine as needed.   5.   Hypokalemia -Potassium repleted currently at 3.9.   -Magnesium at 2.0.   6.  Hyponatremia -Likely secondary to volume depletion. -Improved with hydration. -Saline lock IV fluids.   -Follow.  7.  Lower extremity edema Patient with lower extremity edema right greater than left with some tightness in his thigh.   -Patient with albumin of 2.2.   -Saline lock IV fluids.   -IV albumin 25 g x 1.  Lasix 20 mg IV x1.   -Reassess in the morning.    DVT prophylaxis: SCDs Code Status: Full Family Communication: Updated patient. No family at bedside. Disposition:   Status is: Inpatient  Remains inpatient appropriate because: Due to ongoing treatment.       Consultants:  [facility] hospitalist: Dr. Ronaldo Miyamoto 04/22/2021  Procedures:  CT pelvis 04/21/2021 Chest x-ray 04/21/2021 Renal ultrasound 04/22/2021 Incision, drainage, debridement of right thigh/groin necrotizing fasciitis per Dr. Magnus Ivan, general surgery 04/22/2021 Irrigation and debridement right groin wound, right thigh and potential further debridement per Dr. Magnus Ivan 04/23/2021    Antimicrobials:  IV vancomycin 04/21/2021>>>> 04/23/2021 IV Zosyn 04/21/2021>>>>> 04/23/2021 IV cefepime 04/23/2021>>>> IV Zyvox 04/23/2021>>>>>>   Subjective: Patient standing up at the side of the bed complaining of right leg tightness and pain.  No chest pain.  Denies any shortness of breath.  No abdominal pain.  Stated has not had a bowel movement for 4 days.   Objective: Vitals:   04/24/21 0617 04/24/21 1434 04/24/21 2201 04/25/21 0616  BP: (!) 175/87 (!) 173/99 (!) 190/90 (!) 171/96  Pulse: 93 81 84 100  Resp: 16 18 18 18   Temp: 98.4 F (36.9 C)  98.7 F (37.1 C) 99.6 F (37.6 C)  TempSrc: Oral  Oral Oral  SpO2: 95%  97% 96%  Weight:      Height:        Intake/Output Summary (Last 24 hours) at 04/25/2021 1156 Last data filed at 04/25/2021 1147 Gross per 24 hour  Intake 3178.79 ml  Output 5225 ml  Net -2046.21 ml    Filed  Weights   04/21/21 1504  Weight: 111.6 kg    Examination:  General exam: NAD Respiratory system: Lungs clear to auscultation bilaterally.  No wheezes, no crackles, no rhonchi.  Fair air movement.  Normal respiratory effort. Cardiovascular system: Regular rate rhythm no murmurs rubs or gallops.  No JVD. 2+ bilateral lower extremity edema R > L no lower extremity edema. Gastrointestinal system: Abdomen is soft, distended, nontender to palpation, positive bowel sounds.  No rebound.  No guarding. Central nervous system: Alert and oriented. No focal neurological deficits. Extremities: Right groin region with dressing intact.  Right upper thigh with with some erythema, less tight, less tender to palpation.  Skin: No rashes, lesions or ulcers Psychiatry: Judgement and insight appear normal. Mood & affect appropriate.     Data Reviewed: I have personally reviewed following labs and imaging studies  CBC: Recent Labs  Lab 04/21/21 1748 04/22/21 0658 04/23/21 0310 04/24/21 0729 04/25/21 0325  WBC 15.0* 15.9* 16.1* 14.7* 13.2*  NEUTROABS 12.7*  --   --  11.6* 8.3*  HGB 11.2* 10.9* 10.7* 10.4* 10.1*  HCT 32.6* 32.4* 33.2* 31.5* 31.6*  MCV 81.3 82.9 85.1 83.8 85.6  PLT 273 312 358 336 460*     Basic Metabolic Panel: Recent Labs  Lab 04/21/21 2135  04/22/21 8101 04/23/21 0310 04/23/21 0750 04/24/21 0729 04/25/21 0325  NA 128* 129* 132*  --  130* 133*  K 3.0* 3.6 3.3*  --  4.7 3.9  CL 95* 97* 102  --  104 106  CO2 23 22 22   --  19* 19*  GLUCOSE 261* 217* 135*  --  212* 178*  BUN 31* 34* 36*  --  35* 27*  CREATININE 2.55* 2.59* 3.17*  --  1.94* 1.71*  CALCIUM 7.7* 7.8* 7.6*  --  7.9* 7.9*  MG  --   --   --  2.0  --   --   PHOS  --   --   --   --  2.6 2.3*     GFR: Estimated Creatinine Clearance: 75.4 mL/min (A) (by C-G formula based on SCr of 1.71 mg/dL (H)).  Liver Function Tests: Recent Labs  Lab 04/21/21 1748 04/24/21 0729 04/25/21 0325  AST 44*  --   --   ALT  59*  --   --   ALKPHOS 126  --   --   BILITOT 0.7  --   --   PROT 6.3*  --   --   ALBUMIN 2.4* 1.8* 2.2*     CBG: Recent Labs  Lab 04/24/21 1143 04/24/21 1716 04/24/21 2144 04/25/21 0755 04/25/21 1142  GLUCAP 153* 226* 244* 108* 71      Recent Results (from the past 240 hour(s))  Blood culture (routine x 2)     Status: None (Preliminary result)   Collection Time: 04/21/21  5:45 PM   Specimen: BLOOD  Result Value Ref Range Status   Specimen Description   Final    BLOOD BLOOD LEFT FOREARM Performed at [facility] [address] [place], [place]    Special Requests   Final    BOTTLES DRAWN AEROBIC AND ANAEROBIC Blood Culture adequate volume Performed at [facility], [address] [place], [place]    Culture   Final    NO GROWTH 4 DAYS Performed at [facility], [address], [place], [place]    Report Status PENDING  Incomplete  Blood culture (routine x 2)     Status: None (Preliminary result)   Collection Time: 04/21/21  6:30 PM   Specimen: BLOOD  Result Value Ref Range Status   Specimen Description   Final    BLOOD RIGHT ANTECUBITAL Performed at [facility], [address] [place], [place]    Special Requests   Final    BOTTLES DRAWN AEROBIC AND ANAEROBIC Blood Culture results may not be optimal due to an excessive volume of blood received in culture bottles Performed at [facility], [address] [place], [place]    Culture   Final    NO GROWTH 4 DAYS Performed at [facility], [address], [place], [place]    Report Status PENDING  Incomplete  Resp Panel by RT-PCR (Flu A&B, Covid) Nasopharyngeal Swab     Status: None   Collection Time: 04/21/21  6:45 PM   Specimen: Nasopharyngeal Swab; Nasopharyngeal(NP) swabs in vial transport medium  Result Value Ref Range Status   SARS Coronavirus 2 by RT PCR NEGATIVE NEGATIVE Final     Comment: (NOTE) SARS-CoV-2 target nucleic acids are NOT DETECTED.  The SARS-CoV-2 RNA is generally detectable in upper respiratory specimens during the acute phase of  infection. The lowest concentration of SARS-CoV-2 viral copies this assay can detect is 138 copies/mL. A negative result does not preclude SARS-Cov-2 infection and should not be used as the sole basis for treatment or other patient management decisions. A negative result may occur with  improper specimen collection/handling, submission of specimen other than nasopharyngeal swab, presence of viral mutation(s) within the areas targeted by this assay, and inadequate number of viral copies(<138 copies/mL). A negative result must be combined with clinical observations, patient history, and epidemiological information. The expected result is Negative.  Fact Sheet for Patients:  BloggerCourse.com  Fact Sheet for Healthcare Providers:  SeriousBroker.it  This test is no t yet approved or cleared by the [place] FDA and  has been authorized for detection and/or diagnosis of SARS-CoV-2 by FDA under an Emergency Use Authorization (EUA). This EUA will remain  in effect (meaning this test can be used) for the duration of the COVID-19 declaration under Section 564(b)(1) of the Act, 21 U.S.C.section 360bbb-3(b)(1), unless the authorization is terminated  or revoked sooner.       Influenza A by PCR NEGATIVE NEGATIVE Final   Influenza B by PCR NEGATIVE NEGATIVE Final    Comment: (NOTE) The Xpert Xpress SARS-CoV-2/FLU/RSV plus assay is intended as an aid in the diagnosis of influenza from Nasopharyngeal swab specimens and should not be used as a sole basis for treatment. Nasal washings and aspirates are unacceptable for Xpert Xpress SARS-CoV-2/FLU/RSV testing.  Fact Sheet for Patients: BloggerCourse.com  Fact Sheet for Healthcare  Providers: SeriousBroker.it  This test is not yet approved or cleared by the [place] FDA and has been authorized for detection and/or diagnosis of SARS-CoV-2 by FDA under an Emergency Use Authorization (EUA). This EUA will remain in effect (meaning this test can be used) for the duration of the COVID-19 declaration under Section 564(b)(1) of the Act, 21 U.S.C. section 360bbb-3(b)(1), unless the authorization is terminated or revoked.  Performed at [facility], [address] [place], [place] [address]   Urine Culture     Status: None   Collection Time: 04/21/21 10:25 PM   Specimen: In/Out Cath Urine  Result Value Ref Range Status   Specimen Description   Final    IN/OUT CATH URINE Performed at [facility], [address] [place], [place] [address]    Special Requests   Final    NONE Performed at [facility], [address] [place], [place] [address]    Culture   Final    NO GROWTH Performed at [facility], [address], [place], [place] [address]    Report Status 04/23/2021 FINAL  Final  Aerobic/Anaerobic Culture w Gram Stain (surgical/deep wound)     Status: None (Preliminary result)   Collection Time: 04/22/21 12:29 AM   Specimen: Abscess  Result Value Ref Range Status   Specimen Description   Final    ABSCESS Performed at [facility], [address] [place], [place] [address]    Special Requests   Final    NONE Performed at [facility], [address] [place], [place] [address]    Gram Stain   Final    FEW WBC PRESENT,BOTH PMN AND MONONUCLEAR ABUNDANT GRAM POSITIVE COCCI IN CLUSTERS IN TETRADS ABUNDANT GRAM NEGATIVE RODS Performed at [facility], [address], [place], [place] [address]    Culture   Final    NO AEROBIC GROWTH 2  DAYS MIXED ANAEROBIC FLORA PRESENT.  CALL LAB IF FURTHER IID REQUIRED.    Report Status PENDING  Incomplete           Radiology Studies: No results found.      Scheduled Meds:  acetaminophen  1,000 mg Oral QID   amLODipine  10 mg Oral Daily   enoxaparin (LOVENOX) injection  40 mg Subcutaneous Q24H   feeding supplement  237 mL Oral BID BM   gabapentin  300 mg Oral BID   hydrALAZINE  50 mg Oral Q8H   insulin aspart  0-15 Units Subcutaneous TID WC   insulin aspart  4 Units Subcutaneous TID WC   insulin detemir  22 Units Subcutaneous BID   senna-docusate  1 tablet Oral BID   sodium bicarbonate  650 mg Oral BID   sucralfate  1 g Oral TID WC & HS   Continuous Infusions:  sodium chloride 1,000 mL (04/25/21 0837)   ceFEPime (MAXIPIME) IV 2 g (04/25/21 1049)   linezolid (ZYVOX) IV 600 mg (04/25/21 0336)     LOS: 3 days    Time spent: 40 minutes    Ramiro Harvest, MD [facility]   To contact the attending provider between 7A-7P or the covering provider during after hours 7P-7A, please log into the web site [email or web address] and access using universal [place] password for that web site. If you do not have the password, please call the hospital operator.  04/25/2021, 11:56 AM

## 2021-04-26 ENCOUNTER — Encounter (HOSPITAL_COMMUNITY): Admission: EM | Disposition: A | Discharge status: Home / Self Care | Payer: Self-pay | Source: Home / Self Care

## 2021-04-26 ENCOUNTER — Inpatient Hospital Stay (HOSPITAL_COMMUNITY): Payer: Self-pay | Admitting: Certified Registered"

## 2021-04-26 ENCOUNTER — Encounter (HOSPITAL_COMMUNITY): Payer: Self-pay | Admitting: Surgery

## 2021-04-26 DIAGNOSIS — L02415 Cutaneous abscess of right lower limb: Secondary | ICD-10-CM

## 2021-04-26 DIAGNOSIS — L03115 Cellulitis of right lower limb: Secondary | ICD-10-CM

## 2021-04-26 HISTORY — PX: IRRIGATION AND DEBRIDEMENT ABSCESS: SHX5252

## 2021-04-26 LAB — CULTURE, BLOOD (ROUTINE X 2)
Culture: NO GROWTH
Culture: NO GROWTH
Special Requests: ADEQUATE

## 2021-04-26 LAB — GLUCOSE, CAPILLARY
Glucose-Capillary: 112 mg/dL — ABNORMAL HIGH (ref 70–99)
Glucose-Capillary: 97 mg/dL (ref 70–99)
Glucose-Capillary: 111 mg/dL — ABNORMAL HIGH (ref 70–99)
Glucose-Capillary: 187 mg/dL — ABNORMAL HIGH (ref 70–99)
Glucose-Capillary: 205 mg/dL — ABNORMAL HIGH (ref 70–99)

## 2021-04-26 LAB — CBC WITH DIFFERENTIAL/PLATELET
Abs Immature Granulocytes: 0.56 10*3/uL — ABNORMAL HIGH (ref 0.00–0.07)
Basophils Absolute: 0.1 10*3/uL (ref 0.0–0.1)
Basophils Relative: 1 %
Eosinophils Absolute: 0.2 10*3/uL (ref 0.0–0.5)
Eosinophils Relative: 1 %
HCT: 31.4 % — ABNORMAL LOW (ref 39.0–52.0)
Hemoglobin: 10.2 g/dL — ABNORMAL LOW (ref 13.0–17.0)
Immature Granulocytes: 3 %
Lymphocytes Relative: 19 %
Lymphs Abs: 3.6 10*3/uL (ref 0.7–4.0)
MCH: 28.1 pg (ref 26.0–34.0)
MCHC: 32.5 g/dL (ref 30.0–36.0)
MCV: 86.5 fL (ref 80.0–100.0)
Monocytes Absolute: 1.6 10*3/uL — ABNORMAL HIGH (ref 0.1–1.0)
Monocytes Relative: 9 %
Neutro Abs: 12.5 10*3/uL — ABNORMAL HIGH (ref 1.7–7.7)
Neutrophils Relative %: 67 %
Platelets: 464 10*3/uL — ABNORMAL HIGH (ref 150–400)
RBC: 3.63 MIL/uL — ABNORMAL LOW (ref 4.22–5.81)
RDW: 14.9 % (ref 11.5–15.5)
WBC: 18.6 10*3/uL — ABNORMAL HIGH (ref 4.0–10.5)
nRBC: 0.1 % (ref 0.0–0.2)

## 2021-04-26 LAB — RENAL FUNCTION PANEL
Albumin: 2.2 g/dL — ABNORMAL LOW (ref 3.5–5.0)
Anion gap: 9 (ref 5–15)
BUN: 21 mg/dL — ABNORMAL HIGH (ref 6–20)
CO2: 18 mmol/L — ABNORMAL LOW (ref 22–32)
Calcium: 8 mg/dL — ABNORMAL LOW (ref 8.9–10.3)
Chloride: 107 mmol/L (ref 98–111)
Creatinine, Ser: 1.64 mg/dL — ABNORMAL HIGH (ref 0.61–1.24)
GFR, Estimated: 56 mL/min — ABNORMAL LOW (ref >60)
Glucose, Bld: 137 mg/dL — ABNORMAL HIGH (ref 70–99)
Phosphorus: 3 mg/dL (ref 2.5–4.6)
Potassium: 3.8 mmol/L (ref 3.5–5.1)
Sodium: 134 mmol/L — ABNORMAL LOW (ref 135–145)

## 2021-04-26 LAB — MAGNESIUM: Magnesium: 1.8 mg/dL (ref 1.7–2.4)

## 2021-04-26 SURGERY — IRRIGATION AND DEBRIDEMENT ABSCESS
Anesthesia: General | Site: Groin | Laterality: Right

## 2021-04-26 MED ORDER — ONDANSETRON HCL 4 MG/2ML IJ SOLN
INTRAMUSCULAR | Status: AC
  Filled 2021-04-26: qty 2

## 2021-04-26 MED ORDER — PROPOFOL 10 MG/ML IV BOLUS
INTRAVENOUS | Status: AC
  Filled 2021-04-26: qty 20

## 2021-04-26 MED ORDER — FENTANYL CITRATE (PF) 100 MCG/2ML IJ SOLN
INTRAMUSCULAR | Status: AC
  Filled 2021-04-26: qty 2

## 2021-04-26 MED ORDER — 0.9 % SODIUM CHLORIDE (POUR BTL) OPTIME
TOPICAL | Status: DC | PRN
  Administered 2021-04-26: 1000 mL

## 2021-04-26 MED ORDER — FENTANYL CITRATE PF 50 MCG/ML IJ SOSY
25.0000 ug | PREFILLED_SYRINGE | INTRAMUSCULAR | Status: DC | PRN
  Administered 2021-04-26: 50 ug via INTRAVENOUS
  Administered 2021-04-26 (×2): 25 ug via INTRAVENOUS

## 2021-04-26 MED ORDER — LABETALOL HCL 5 MG/ML IV SOLN
INTRAVENOUS | Status: AC
  Filled 2021-04-26: qty 4

## 2021-04-26 MED ORDER — ROCURONIUM BROMIDE 10 MG/ML (PF) SYRINGE
PREFILLED_SYRINGE | INTRAVENOUS | Status: AC
  Filled 2021-04-26: qty 10

## 2021-04-26 MED ORDER — ONDANSETRON HCL 4 MG/2ML IJ SOLN
INTRAMUSCULAR | Status: DC | PRN
  Administered 2021-04-26: 4 mg via INTRAVENOUS

## 2021-04-26 MED ORDER — PROPOFOL 10 MG/ML IV BOLUS
INTRAVENOUS | Status: DC | PRN
  Administered 2021-04-26: 200 mg via INTRAVENOUS

## 2021-04-26 MED ORDER — OXYCODONE HCL 5 MG PO TABS: 5.0000 mg | ORAL_TABLET | Freq: Once | ORAL | Status: AC

## 2021-04-26 MED ORDER — FENTANYL CITRATE (PF) 100 MCG/2ML IJ SOLN
INTRAMUSCULAR | Status: DC | PRN
  Administered 2021-04-26: 50 ug via INTRAVENOUS
  Administered 2021-04-26: 25 ug via INTRAVENOUS
  Administered 2021-04-26 (×2): 50 ug via INTRAVENOUS
  Administered 2021-04-26: 25 ug via INTRAVENOUS

## 2021-04-26 MED ORDER — LIDOCAINE 2% (20 MG/ML) 5 ML SYRINGE
INTRAMUSCULAR | Status: DC | PRN
  Administered 2021-04-26: 50 mg via INTRAVENOUS

## 2021-04-26 MED ORDER — OXYCODONE HCL 5 MG PO TABS
ORAL_TABLET | ORAL | Status: AC
  Administered 2021-04-26: 5 mg via ORAL
  Filled 2021-04-26: qty 1

## 2021-04-26 MED ORDER — ROCURONIUM BROMIDE 10 MG/ML (PF) SYRINGE
PREFILLED_SYRINGE | INTRAVENOUS | Status: DC | PRN
  Administered 2021-04-26: 60 mg via INTRAVENOUS

## 2021-04-26 MED ORDER — FENTANYL CITRATE PF 50 MCG/ML IJ SOSY
PREFILLED_SYRINGE | INTRAMUSCULAR | Status: AC
  Administered 2021-04-26: 50 ug via INTRAVENOUS
  Filled 2021-04-26: qty 3

## 2021-04-26 MED ORDER — LIDOCAINE HCL (PF) 2 % IJ SOLN
INTRAMUSCULAR | Status: AC
  Filled 2021-04-26: qty 5

## 2021-04-26 MED ORDER — LABETALOL HCL 5 MG/ML IV SOLN
5.0000 mg | Freq: Once | INTRAVENOUS | Status: AC
  Administered 2021-04-26: 5 mg via INTRAVENOUS

## 2021-04-26 MED ORDER — ACETAMINOPHEN 10 MG/ML IV SOLN: 1000.0000 mg | Freq: Once | INTRAVENOUS | Status: DC | PRN

## 2021-04-26 MED ORDER — MIDAZOLAM HCL 2 MG/2ML IJ SOLN
INTRAMUSCULAR | Status: AC
  Filled 2021-04-26: qty 2

## 2021-04-26 MED ORDER — MIDAZOLAM HCL 2 MG/2ML IJ SOLN
INTRAMUSCULAR | Status: DC | PRN
  Administered 2021-04-26: 2 mg via INTRAVENOUS

## 2021-04-26 MED ORDER — DEXAMETHASONE SODIUM PHOSPHATE 10 MG/ML IJ SOLN
INTRAMUSCULAR | Status: AC
  Filled 2021-04-26: qty 1

## 2021-04-26 MED ORDER — SUGAMMADEX SODIUM 500 MG/5ML IV SOLN
INTRAVENOUS | Status: AC
  Filled 2021-04-26: qty 5

## 2021-04-26 MED ORDER — CHLORHEXIDINE GLUCONATE 0.12 % MT SOLN
15.0000 mL | OROMUCOSAL | Status: AC
  Administered 2021-04-26: 15 mL via OROMUCOSAL

## 2021-04-26 MED ORDER — SUGAMMADEX SODIUM 500 MG/5ML IV SOLN
INTRAVENOUS | Status: DC | PRN
Start: 2021-04-26 — End: 2021-04-26
  Administered 2021-04-26: 250 mg via INTRAVENOUS

## 2021-04-26 MED ORDER — LACTATED RINGERS IV SOLN: INTRAVENOUS | Status: DC

## 2021-04-26 SURGICAL SUPPLY — 35 items
BAG COUNTER SPONGE SURGICOUNT (BAG) IMPLANT
BLADE HEX COATED 2.75 (ELECTRODE) ×2 IMPLANT
BLADE SURG SZ10 CARB STEEL (BLADE) ×2 IMPLANT
BNDG GAUZE ELAST 4 BULKY (GAUZE/BANDAGES/DRESSINGS) ×2 IMPLANT
COVER SURGICAL LIGHT HANDLE (MISCELLANEOUS) ×2 IMPLANT
DECANTER SPIKE VIAL GLASS SM (MISCELLANEOUS) IMPLANT
DRAIN PENROSE 0.5X18 (DRAIN) ×2 IMPLANT
DRAPE LAPAROSCOPIC ABDOMINAL (DRAPES) ×2 IMPLANT
DRAPE LAPAROTOMY T 102X78X121 (DRAPES) IMPLANT
DRAPE LAPAROTOMY TRNSV 102X78 (DRAPES) IMPLANT
DRAPE SHEET LG 3/4 BI-LAMINATE (DRAPES) IMPLANT
DRSG PAD ABDOMINAL 8X10 ST (GAUZE/BANDAGES/DRESSINGS) ×2 IMPLANT
ELECT REM PT RETURN 15FT ADLT (MISCELLANEOUS) ×2 IMPLANT
GAUZE SPONGE 4X4 12PLY STRL (GAUZE/BANDAGES/DRESSINGS) ×2 IMPLANT
GLOVE SURG ENC MOIS LTX SZ7 (GLOVE) ×6 IMPLANT
GLOVE SURG UNDER POLY LF SZ7 (GLOVE) ×2 IMPLANT
GLOVE SURG UNDER POLY LF SZ7.5 (GLOVE) ×6 IMPLANT
GOWN STRL REUS W/ TWL XL LVL3 (GOWN DISPOSABLE) ×1 IMPLANT
GOWN STRL REUS W/TWL LRG LVL3 (GOWN DISPOSABLE) ×4 IMPLANT
GOWN STRL REUS W/TWL XL LVL3 (GOWN DISPOSABLE) ×3 IMPLANT
KIT BASIN OR (CUSTOM PROCEDURE TRAY) ×2 IMPLANT
MARKER SKIN DUAL TIP RULER LAB (MISCELLANEOUS) ×2 IMPLANT
NEEDLE HYPO 25X1 1.5 SAFETY (NEEDLE) ×2 IMPLANT
NS IRRIG 1000ML POUR BTL (IV SOLUTION) ×2 IMPLANT
PACK BASIC VI WITH GOWN DISP (CUSTOM PROCEDURE TRAY) ×2 IMPLANT
PENCIL SMOKE EVACUATOR (MISCELLANEOUS) IMPLANT
SPONGE T-LAP 18X18 ~~LOC~~+RFID (SPONGE) IMPLANT
SPONGE T-LAP 4X18 ~~LOC~~+RFID (SPONGE) IMPLANT
STAPLER VISISTAT 35W (STAPLE) IMPLANT
SUT ETHILON 2 0 PS N (SUTURE) ×4 IMPLANT
SUT MNCRL AB 4-0 PS2 18 (SUTURE) IMPLANT
SUT VIC AB 3-0 SH 18 (SUTURE) IMPLANT
SYR CONTROL 10ML LL (SYRINGE) ×2 IMPLANT
TOWEL OR 17X26 10 PK STRL BLUE (TOWEL DISPOSABLE) ×2 IMPLANT
TOWEL OR NON WOVEN STRL DISP B (DISPOSABLE) ×2 IMPLANT

## 2021-04-26 NOTE — Discharge Instructions (Addendum)
Wet to Dry WOUND CARE: - Change dressing twice daily - The penrose drain should remain in place until your follow up appointment at [facility] - Supplies: sterile saline, kerlex, scissors, ABD pads, tape  Remove dressing and all packing carefully, moistening with sterile saline as needed to avoid packing/internal dressing sticking to the wound. 2.   Clean edges of skin around the wound with water/gauze, making sure there is no tape debris or leakage left on skin that could cause skin irritation or breakdown. 3.   Dampen and clean kerlex with sterile saline and pack wound from wound base to skin level, making sure to take note of any possible areas of wound tracking, tunneling and packing appropriately. Wound can be packed loosely. Trim kerlex to size if a whole kerlex is not required. 4.   Cover wound with a dry ABD pad and secure with tape or mesh underwear 5.   Write the date/time on the dry dressing/tape to better track when the last dressing change occurred. - apply any skin protectant/powder if recommended by clinician to protect skin/skin folds. - change dressing as needed if leakage occurs, wound gets contaminated, or patient requests to shower. - You may shower daily with wound open and following the shower the wound should be dried and a clean dressing placed.  - Medical grade tape as well as packing supplies can be found at [employer] on [address] or [employer] on [address]. The remaining supplies can be found at your local drug store, [employer] etc.  Contact a health care provider if: You have a fever or chills. You have a wound or incision that develops any of these signs of infection: More redness, swelling, or pain. More fluid or blood. Warmth. Pus or a bad smell. Get help right away if: You have severe pain. Your skin changes color. You develop swelling that makes the skin feel hard.

## 2021-04-26 NOTE — Progress Notes (Signed)
3 Days Post-Op  Subjective: CC: Continued pain at surgical site that is stable from yesterday. No other complaints. No CP, abdominal pain, n/v, or urinary symptoms. Did have loose stool/diarrhea yesterday.   Objective: Vital signs in last 24 hours: Temp:  [98.6 F (37 C)-100.4 F (38 C)] 100.1 F (37.8 C) (10/19 0635) Pulse Rate:  [100-114] 109 (10/19 0635) Resp:  [16-24] 18 (10/19 0635) BP: (176-199)/(84-101) 185/95 (10/19 0635) SpO2:  [93 %-98 %] 93 % (10/19 0635) Last BM Date: 04/25/21  Intake/Output from previous day: 10/18 0701 - 10/19 0700 In: 5417.4 [P.O.:4100; I.V.:517.4; IV Piggyback:800] Out: 7750 [Urine:7750] Intake/Output this shift: No intake/output data recorded.  PE: Gen:  Alert, NAD, pleasant Pulm: CTA b/l, on RA Cards: Reg Abd: Soft, NT/ND Ext:  right groin wound appears clean, some drainage noted on dressing but no active purulent drainage. Wound measures ~15cm with ~5cm of tracking at medial inferior edge. There is edema and induration of the right thigh  Lab Results:  Recent Labs    04/25/21 0325 04/26/21 0341  WBC 13.2* 18.6*  HGB 10.1* 10.2*  HCT 31.6* 31.4*  PLT 460* 464*   BMET Recent Labs    04/25/21 0325 04/26/21 0341  NA 133* 134*  K 3.9 3.8  CL 106 107  CO2 19* 18*  GLUCOSE 178* 137*  BUN 27* 21*  CREATININE 1.71* 1.64*  CALCIUM 7.9* 8.0*   PT/INR No results for input(s): LABPROT, INR in the last 72 hours. CMP     Component Value Date/Time   NA 134 (L) 04/26/2021 0341   K 3.8 04/26/2021 0341   CL 107 04/26/2021 0341   CO2 18 (L) 04/26/2021 0341   GLUCOSE 137 (H) 04/26/2021 0341   BUN 21 (H) 04/26/2021 0341   CREATININE 1.64 (H) 04/26/2021 0341   CALCIUM 8.0 (L) 04/26/2021 0341   PROT 6.3 (L) 04/21/2021 1748   ALBUMIN 2.2 (L) 04/26/2021 0341   AST 44 (H) 04/21/2021 1748   ALT 59 (H) 04/21/2021 1748   ALKPHOS 126 04/21/2021 1748   BILITOT 0.7 04/21/2021 1748   GFRNONAA 56 (L) 04/26/2021 0341   GFRAA  02/14/2008  1530    >60        The eGFR has been calculated using the MDRD equation. This calculation has not been validated in all clinical   Lipase  No results found for: LIPASE  Studies/Results: No results found.  Anti-infectives: Anti-infectives (From admission, onward)    Start     Dose/Rate Route Frequency Ordered Stop   04/24/21 1000  ceFEPIme (MAXIPIME) 2 g in sodium chloride 0.9 % 100 mL IVPB        2 g 200 mL/hr over 30 Minutes Intravenous Every 8 hours 04/24/21 0925     04/23/21 1400  linezolid (ZYVOX) IVPB 600 mg       Note to Pharmacy: Pharm to adjust dose   600 mg 300 mL/hr over 60 Minutes Intravenous Every 12 hours 04/23/21 1123     04/23/21 1300  ceFEPIme (MAXIPIME) 2 g in sodium chloride 0.9 % 100 mL IVPB  Status:  Discontinued        2 g 200 mL/hr over 30 Minutes Intravenous Every 12 hours 04/23/21 1236 04/24/21 0925   04/23/21 0800  vancomycin (VANCOCIN) IVPB 1000 mg/200 mL premix  Status:  Discontinued        1,000 mg 200 mL/hr over 60 Minutes Intravenous Every 24 hours 04/22/21 0254 04/22/21 0306   04/22/21 0800  vancomycin (  VANCOCIN) IVPB 1000 mg/200 mL premix  Status:  Discontinued        1,000 mg 200 mL/hr over 60 Minutes Intravenous Every 24 hours 04/22/21 0306 04/23/21 1123   04/22/21 0400  piperacillin-tazobactam (ZOSYN) IVPB 3.375 g  Status:  Discontinued        3.375 g 12.5 mL/hr over 240 Minutes Intravenous Every 8 hours 04/22/21 0254 04/23/21 1123   04/21/21 1900  piperacillin-tazobactam (ZOSYN) IVPB 3.375 g        3.375 g 100 mL/hr over 30 Minutes Intravenous  Once 04/21/21 1846 04/21/21 2010   04/21/21 1845  vancomycin (VANCOCIN) IVPB 1000 mg/200 mL premix        1,000 mg 200 mL/hr over 60 Minutes Intravenous  Once 04/21/21 1843 04/21/21 2124   04/21/21 1845  ceFEPIme (MAXIPIME) 2 g in sodium chloride 0.9 % 100 mL IVPB  Status:  Discontinued        2 g 200 mL/hr over 30 Minutes Intravenous  Once 04/21/21 1843 04/21/21 1846   04/21/21 1815  clindamycin  (CLEOCIN) IVPB 600 mg        600 mg 100 mL/hr over 30 Minutes Intravenous  Once 04/21/21 1802 04/21/21 1908        Assessment/Plan POD# 3&4 for IRRIGATION AND DEBRIDEMENT RIGHT GROIN WOUND; RIGHT THIGH 10/15 and 10/16 Dr. Ninfa Linden for Necrotizing fascitis - Given rise in WBC and slight fever overnight, will plan to take back to the OR today for another look - Culture pending, re-incubated  - Continue broad spectrum antibiotics and follow cultures.  - Maximize blood sugar control to aid in healing process. CBG's are better today - Mobilize - Pulm toilet   ID - currently maxipime and linezolid FEN - NPO VTE - SCDs, lovenox Foley - none   IDDM - will need medication assistance for insulin at discharge HTN - appreciate TRH assistance CKD2, AKI resolved BMI 35.30   LOS: 4 days    Jillyn Ledger , [facility] 04/26/2021, 8:37 AM Please see Amion for pager number during day hours 7:00am-4:30pm

## 2021-04-26 NOTE — Plan of Care (Signed)
  Problem: Skin Integrity: Goal: Risk for impaired skin integrity will decrease Outcome: Progressing   Problem: Pain Managment: Goal: General experience of comfort will improve Outcome: Progressing   

## 2021-04-26 NOTE — Anesthesia Preprocedure Evaluation (Signed)
Anesthesia Evaluation  Patient identified by MRN, date of birth, ID band Patient awake    Reviewed: Allergy & Precautions, NPO status , Patient's Chart, lab work & pertinent test results  Airway Mallampati: II  TM Distance: >3 FB Neck ROM: Full    Dental  (+) Teeth Intact   Pulmonary neg pulmonary ROS,    Pulmonary exam normal        Cardiovascular hypertension, Pt. on medications  Rhythm:Regular Rate:Normal     Neuro/Psych negative neurological ROS  negative psych ROS   GI/Hepatic negative GI ROS, Neg liver ROS,   Endo/Other  diabetes, Type 2, Insulin Dependent, Oral Hypoglycemic Agents  Renal/GU negative Renal ROS  negative genitourinary   Musculoskeletal Right groin infection   Abdominal (+)  Abdomen: soft.    Peds  Hematology negative hematology ROS (+)   Anesthesia Other Findings   Reproductive/Obstetrics                             Anesthesia Physical Anesthesia Plan  ASA: 2  Anesthesia Plan: General   Post-op Pain Management:    Induction: Intravenous  PONV Risk Score and Plan: 2 and Ondansetron, Dexamethasone, Midazolam and Treatment may vary due to age or medical condition  Airway Management Planned: Mask and Oral ETT  Additional Equipment: None  Intra-op Plan:   Post-operative Plan: Extubation in OR  Informed Consent: I have reviewed the patients History and Physical, chart, labs and discussed the procedure including the risks, benefits and alternatives for the proposed anesthesia with the patient or authorized representative who has indicated his/her understanding and acceptance.     Dental advisory given  Plan Discussed with: CRNA  Anesthesia Plan Comments: (Lab Results      Component                Value               Date                      WBC                      18.6 (H)            04/26/2021                HGB                      10.2 (L)             04/26/2021                HCT                      31.4 (L)            04/26/2021                MCV                      86.5                04/26/2021                PLT                      464 (H)  04/26/2021           Lab Results      Component                Value               Date                      NA                       134 (L)             04/26/2021                K                        3.8                 04/26/2021                CO2                      18 (L)              04/26/2021                GLUCOSE                  137 (H)             04/26/2021                BUN                      21 (H)              04/26/2021                CREATININE               1.64 (H)            04/26/2021                CALCIUM                  8.0 (L)             04/26/2021                GFRNONAA                 56 (L)              04/26/2021          )        Anesthesia Quick Evaluation

## 2021-04-26 NOTE — Op Note (Signed)
Pre-op Diagnosis: NECROTIZING FASCIITIS     Post-op Diagnosis: same   Procedure: dressing change right groin, incision and drainage right anterior thigh with Penrose placement for evacuation of infectoin   Surgeon(s): Emelia Loron MD   Anesthesia: General  Estimated Blood Loss: Minimal               Indications: This is a 35 year old gentleman with right groin NSTI. He has been debrided twice.  He was improving but today because of increasing white blood count, fever the decision was made to proceed back to the operating room for a dressing change and possible further debridement    Procedure: After informed consent obtained he was taken to the OR.  He was already on antibiotics. He had SCDs in place.   He was placed under general anesthesia without complication. He was placed in lithotomy and padded. He was prepped and draped in standard sterile surgical fashion. Timeout was performed.   I removed his packing.  The groin had some murky fluid and some nonviable tissue. This was minimal and I removed it. Most of this was very medial.  I was then able to identify tracking along the fascial plane further down the thigh. I elected to make a counterincision on anterior thigh and go down through the fascia to the muscle. This all appeared viable. I think it was just some more undrained infection.  I placed a one inch penrose through this tunnel in the adductor compartment and secured it. Hemostasis appeared to be achieved.   I then packed the wound with Kerlix.  Dry gauze and ABDs were placed over this.  The patient tolerated the procedure well.  All the counts were correct at the end of the procedure.  He was then extubated in the operating room and taken in stable condition to the recovery room.

## 2021-04-26 NOTE — Transfer of Care (Signed)
Immediate Anesthesia Transfer of Care Note  Patient: Bradley Davis  Procedure(s) Performed: reexploration and debridement of soft tissue infection right groin (Right: Groin)  Patient Location: PACU  Anesthesia Type:General  Level of Consciousness: awake, alert  and oriented  Airway & Oxygen Therapy: Patient Spontanous Breathing and Patient connected to face mask oxygen  Post-op Assessment: Report given to RN, Post -op Vital signs reviewed and stable and Patient moving all extremities X 4  Post vital signs: Reviewed and stable  Last Vitals:  Vitals Value Taken Time  BP 158/90   Temp    Pulse 100   Resp    SpO2 98     Last Pain:  Vitals:   04/26/21 0942  TempSrc: Oral  PainSc: 6       Patients Stated Pain Goal: 3 (04/25/21 0210)  Complications: No notable events documented.

## 2021-04-26 NOTE — Anesthesia Procedure Notes (Signed)
Procedure Name: Intubation Date/Time: 04/26/2021 10:17 AM Performed by: Niel Hummer, CRNA Pre-anesthesia Checklist: Patient identified, Emergency Drugs available, Suction available and Patient being monitored Patient Re-evaluated:Patient Re-evaluated prior to induction Oxygen Delivery Method: Circle system utilized Preoxygenation: Pre-oxygenation with 100% oxygen Induction Type: IV induction Ventilation: Mask ventilation without difficulty Laryngoscope Size: Mac and 4 Grade View: Grade I Tube type: Oral Tube size: 7.5 mm Number of attempts: 1 Airway Equipment and Method: Stylet Placement Confirmation: ETT inserted through vocal cords under direct vision, positive ETCO2 and breath sounds checked- equal and bilateral Secured at: 23 cm Tube secured with: Tape Dental Injury: Teeth and Oropharynx as per pre-operative assessment

## 2021-04-26 NOTE — Progress Notes (Signed)
PHARMACY NOTE -  Cefepime  Pharmacy has been assisting with dosing of cefepime for wound infection. Dosage remains stable at 2g IV q8 hr and further renal adjustments per institutional Pharmacy antibiotic protocol  Pharmacy will sign off, following peripherally for culture results or dose adjustments. Please reconsult if a change in clinical status warrants re-evaluation of dosage.  Bernadene Person, PharmD, BCPS [phone] 04/26/2021, 8:44 AM

## 2021-04-26 NOTE — Progress Notes (Signed)
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  Patient subsequently underwent initial I and D per general surgery, TRH consulted for management for acute kidney injury on chronic kidney disease, management of diabetes, hypertension.  Patient went back to the OR on 04/23/2021 and again on 04/26/2021 for further I and D  Assessment & Plan:   Principal Problem:   Necrotizing fasciitis (HCC) Active Problems:   Cellulitis and abscess of right leg   DM (diabetes mellitus), type 1 with renal complications (HCC)   HTN (hypertension)   Acute renal failure superimposed on stage 2 chronic kidney disease (HCC)   Hypokalemia   Abscess of groin    1 necrotizing fasciitis of the right groin/thigh -Patient admitted to the general surgical service underwent incision, drainage, debridement of right thigh/groin necrotizing fasciitis with cultures obtained currently pending. -Blood cultures thus far without growth x5 days -Currently afebrile, T-max of 37.8 C at 6:35 AM this morning -Leukocytosis had initially trended down, up to 18.6 today -Patient undergoing incision and drainage per general surgery, on 04/23/2021 and again this morning. -IV Vanco and IV Zosyn was earlier changed to IV cefepime and IV Zyvox due to acute on chronic renal failure.  -Per primary team/general surgery.  2.  Acute renal failure on chronic kidney disease stage II -Initial baseline creatinine 1.3-1.5 per primary nephrologist note from 06/07/2021. -Concern for possible progression of chronic kidney disease. -Acute renal failure  on chronic kidney disease likely multifactorial secondary to acute infection with necrotizing fasciitis of the right groin/thigh, concern for possible rhabdomyolysis from acute infection/injury in the setting of dehydration and in the setting of ACE inhibitor. -Renal ultrasound with unremarkable sonographic appearance of bilateral kidneys. -Urinalysis with glycosuria, nitrite negative, leukocytes negative, proteinuria. -recent Urine sodium at 13, urine creatinine at 112.45. -CK level at 769.   -Received a 500 cc bolus of normal saline on 04/23/2021 and IV fluids increased to 150 cc an hour.   -Creatinine is down to 1.64 today   -Due to concern for volume overload and hypoalbuminemia, patient was given one dose of IV albumin followed by Lasix 20 mg x 1.  -Was on IV vancomycin, Zosyn with continued worsening of renal function and that has been discontinued and patient currently on IV Zyvox and cefepime.  -Repeat basic metabolic panel in the morning  3.  Poorly controlled diabetes mellitus type 1 -Patient noted to be on insulin pump being followed by endocrinology in the outpatient setting. -Hemoglobin A1c 14.4 (04/23/2021). -Patient placed back on diet and currently on Levemir 22 units twice daily.  -Discontinue meal coverage NovoLog.   -SSI.   -Glycemic trends reviewed, stable and relatively well-controlled -Diabetes coordinator following.    4.  Hypertension -ACE inhibitor on hold secondary to problem #2. -Norvasc increased to 10 mg daily.   -BP elevated and as such we will increase hydralazine to 50 mg p.o. 3 times daily.   -Would continue with analgesia as needed -Continue with hydralazine as needed.   5.  Hypokalemia -Replaced -Continue to follow basic metabolic panel  6.  Hyponatremia -Likely secondary to volume depletion. -Improved with hydration.  7.  Lower extremity edema Patient with lower extremity edema right greater than left with some tightness in his thigh.   -Patient  with albumin of 2.2.   -Patient was earlier given IV albumin 25 g x 1.  Lasix 20 mg IV x1.     DVT prophylaxis: Lovenox subq Code Status: Full Family Communication: Pt in room, family currently at bedside  Status is: Inpatient  Remains inpatient appropriate because: severity of illness  Consultants:  [facility] hospitalist: Dr. Ronaldo Miyamoto 04/22/2021   Procedures:  CT pelvis 04/21/2021 Chest x-ray 04/21/2021 Renal ultrasound 04/22/2021 Incision, drainage, debridement of right thigh/groin necrotizing fasciitis per Dr. Magnus Ivan, general surgery 04/22/2021 Irrigation and debridement right groin wound, right thigh and potential further debridement per Dr. Magnus Ivan 04/23/2021      Antimicrobials:  IV vancomycin 04/21/2021>>>> 04/23/2021 IV Zosyn 04/21/2021>>>>> 04/23/2021 IV cefepime 04/23/2021>>>> IV Zyvox 04/23/2021>>>>>>  Antimicrobials: Anti-infectives (From admission, onward)    Start     Dose/Rate Route Frequency Ordered Stop   04/24/21 1000  ceFEPIme (MAXIPIME) 2 g in sodium chloride 0.9 % 100 mL IVPB        2 g 200 mL/hr over 30 Minutes Intravenous Every 8 hours 04/24/21 0925     04/23/21 1400  linezolid (ZYVOX) IVPB 600 mg       Note to Pharmacy: Pharm to adjust dose   600 mg 300 mL/hr over 60 Minutes Intravenous Every 12 hours 04/23/21 1123     04/23/21 1300  ceFEPIme (MAXIPIME) 2 g in sodium chloride 0.9 % 100 mL IVPB  Status:  Discontinued        2 g 200 mL/hr over 30 Minutes Intravenous Every 12 hours 04/23/21 1236 04/24/21 0925   04/23/21 0800  vancomycin (VANCOCIN) IVPB 1000 mg/200 mL premix  Status:  Discontinued        1,000 mg 200 mL/hr over 60 Minutes Intravenous Every 24 hours 04/22/21 0254 04/22/21 0306   04/22/21 0800  vancomycin (VANCOCIN) IVPB 1000 mg/200 mL premix  Status:  Discontinued        1,000 mg 200 mL/hr over 60 Minutes Intravenous Every 24 hours 04/22/21 0306 04/23/21 1123   04/22/21 0400  piperacillin-tazobactam (ZOSYN) IVPB 3.375 g  Status:   Discontinued        3.375 g 12.5 mL/hr over 240 Minutes Intravenous Every 8 hours 04/22/21 0254 04/23/21 1123   04/21/21 1900  piperacillin-tazobactam (ZOSYN) IVPB 3.375 g        3.375 g 100 mL/hr over 30 Minutes Intravenous  Once 04/21/21 1846 04/21/21 2010   04/21/21 1845  vancomycin (VANCOCIN) IVPB 1000 mg/200 mL premix        1,000 mg 200 mL/hr over 60 Minutes Intravenous  Once 04/21/21 1843 04/21/21 2124   04/21/21 1845  ceFEPIme (MAXIPIME) 2 g in sodium chloride 0.9 % 100 mL IVPB  Status:  Discontinued        2 g 200 mL/hr over 30 Minutes Intravenous  Once 04/21/21 1843 04/21/21 1846   04/21/21 1815  clindamycin (CLEOCIN) IVPB 600 mg        600 mg 100 mL/hr over 30 Minutes Intravenous  Once 04/21/21 1802 04/21/21 1908       Subjective: Patient was seen postoperatively today.  Reports residual pain  Objective: Vitals:   04/26/21 1215 04/26/21 1235 04/26/21 1359 04/26/21 1603  BP: (!) 179/94 (!) 199/94 (!) 188/91 (!) 169/75  Pulse: 90 89 96 (!) 101  Resp: (!) 23 20 (!) 22 (!) 21  Temp:  97.8  F (36.6 C) 98.4 F (36.9 C) 98.7 F (37.1 C)  TempSrc:  Oral Oral Oral  SpO2: 92% 100% 97% 92%  Weight:      Height:        Intake/Output Summary (Last 24 hours) at 04/26/2021 1629 Last data filed at 04/26/2021 1350 Gross per 24 hour  Intake 3497.39 ml  Output 5555 ml  Net -2057.61 ml   Filed Weights   04/21/21 1504 04/26/21 0942  Weight: 111.6 kg 111.6 kg    Examination: General exam: Awake, laying in bed, in nad Respiratory system: Normal respiratory effort, no wheezing Cardiovascular system: regular rate, s1, s2 Gastrointestinal system: Soft, nondistended, positive BS Central nervous system: CN2-12 grossly intact, strength intact Extremities: Perfused, no clubbing Skin: Normal skin turgor, no notable skin lesions seen Psychiatry: Mood normal // no visual hallucinations   Data Reviewed: I have personally reviewed following labs and imaging studies  CBC: Recent  Labs  Lab 04/21/21 1748 04/22/21 0658 04/23/21 0310 04/24/21 0729 04/25/21 0325 04/26/21 0341  WBC 15.0* 15.9* 16.1* 14.7* 13.2* 18.6*  NEUTROABS 12.7*  --   --  11.6* 8.3* 12.5*  HGB 11.2* 10.9* 10.7* 10.4* 10.1* 10.2*  HCT 32.6* 32.4* 33.2* 31.5* 31.6* 31.4*  MCV 81.3 82.9 85.1 83.8 85.6 86.5  PLT 273 312 358 336 460* 464*   Basic Metabolic Panel: Recent Labs  Lab 04/22/21 0658 04/23/21 0310 04/23/21 0750 04/24/21 0729 04/25/21 0325 04/26/21 0341  NA 129* 132*  --  130* 133* 134*  K 3.6 3.3*  --  4.7 3.9 3.8  CL 97* 102  --  104 106 107  CO2 22 22  --  19* 19* 18*  GLUCOSE 217* 135*  --  212* 178* 137*  BUN 34* 36*  --  35* 27* 21*  CREATININE 2.59* 3.17*  --  1.94* 1.71* 1.64*  CALCIUM 7.8* 7.6*  --  7.9* 7.9* 8.0*  MG  --   --  2.0  --   --  1.8  PHOS  --   --   --  2.6 2.3* 3.0   GFR: Estimated Creatinine Clearance: 78.6 mL/min (A) (by C-G formula based on SCr of 1.64 mg/dL (H)). Liver Function Tests: Recent Labs  Lab 04/21/21 1748 04/24/21 0729 04/25/21 0325 04/26/21 0341  AST 44*  --   --   --   ALT 59*  --   --   --   ALKPHOS 126  --   --   --   BILITOT 0.7  --   --   --   PROT 6.3*  --   --   --   ALBUMIN 2.4* 1.8* 2.2* 2.2*   No results for input(s): LIPASE, AMYLASE in the last 168 hours. No results for input(s): AMMONIA in the last 168 hours. Coagulation Profile: Recent Labs  Lab 04/21/21 1905  INR 1.3*   Cardiac Enzymes: Recent Labs  Lab 04/23/21 0840  CKTOTAL 769*   BNP (last 3 results) No results for input(s): PROBNP in the last 8760 hours. HbA1C: No results for input(s): HGBA1C in the last 72 hours. CBG: Recent Labs  Lab 04/25/21 1648 04/25/21 2146 04/26/21 0742 04/26/21 0956 04/26/21 1101  GLUCAP 127* 162* 112* 97 111*   Lipid Profile: No results for input(s): CHOL, HDL, LDLCALC, TRIG, CHOLHDL, LDLDIRECT in the last 72 hours. Thyroid Function Tests: No results for input(s): TSH, T4TOTAL, FREET4, T3FREE, THYROIDAB in the  last 72 hours. Anemia Panel: No results for input(s): VITAMINB12, FOLATE, FERRITIN,  TIBC, IRON, RETICCTPCT in the last 72 hours. Sepsis Labs: Recent Labs  Lab 04/21/21 1748 04/21/21 2023  LATICACIDVEN 2.6* 2.3*    Recent Results (from the past 240 hour(s))  Blood culture (routine x 2)     Status: None   Collection Time: 04/21/21  5:45 PM   Specimen: BLOOD  Result Value Ref Range Status   Specimen Description   Final    BLOOD BLOOD LEFT FOREARM Performed at [facility], [address] [place], [place] [address]    Special Requests   Final    BOTTLES DRAWN AEROBIC AND ANAEROBIC Blood Culture adequate volume Performed at [facility], [address] [place], [place] [address]    Culture   Final    NO GROWTH 5 DAYS Performed at [facility], [address] [place], [place] [address]    Report Status 04/26/2021 FINAL  Final  Blood culture (routine x 2)     Status: None   Collection Time: 04/21/21  6:30 PM   Specimen: BLOOD  Result Value Ref Range Status   Specimen Description   Final    BLOOD RIGHT ANTECUBITAL Performed at [facility], [address] [place], [place] [address]    Special Requests   Final    BOTTLES DRAWN AEROBIC AND ANAEROBIC Blood Culture results may not be optimal due to an excessive volume of blood received in culture bottles Performed at [facility], [address] [place], [place] [address]    Culture   Final    NO GROWTH 5 DAYS Performed at [facility], [address], [place], [place] [address]    Report Status 04/26/2021 FINAL  Final  Resp Panel by RT-PCR (Flu A&B, Covid) Nasopharyngeal Swab     Status: None   Collection Time: 04/21/21  6:45 PM   Specimen: Nasopharyngeal Swab; Nasopharyngeal(NP) swabs in vial transport medium  Result Value Ref Range Status   SARS Coronavirus 2 by RT PCR NEGATIVE NEGATIVE Final    Comment: (NOTE) SARS-CoV-2 target nucleic acids are NOT  DETECTED.  The SARS-CoV-2 RNA is generally detectable in upper respiratory specimens during the acute phase of infection. The lowest concentration of SARS-CoV-2 viral copies this assay can detect is 138 copies/mL. A negative result does not preclude SARS-Cov-2 infection and should not be used as the sole basis for treatment or other patient management decisions. A negative result may occur with  improper specimen collection/handling, submission of specimen other than nasopharyngeal swab, presence of viral mutation(s) within the areas targeted by this assay, and inadequate number of viral copies(<138 copies/mL). A negative result must be combined with clinical observations, patient history, and epidemiological information. The expected result is Negative.  Fact Sheet for Patients:  BloggerCourse.com  Fact Sheet for Healthcare Providers:  SeriousBroker.it  This test is no t yet approved or cleared by the [place] FDA and  has been authorized for detection and/or diagnosis of SARS-CoV-2 by FDA under an Emergency Use Authorization (EUA). This EUA will remain  in effect (meaning this test can be used) for the duration of the COVID-19 declaration under Section 564(b)(1) of the Act, 21 U.S.C.section 360bbb-3(b)(1), unless the authorization is terminated  or revoked sooner.       Influenza A by PCR NEGATIVE NEGATIVE Final   Influenza B by PCR NEGATIVE NEGATIVE Final    Comment: (NOTE) The Xpert Xpress SARS-CoV-2/FLU/RSV plus assay is intended as an aid  in the diagnosis of influenza from Nasopharyngeal swab specimens and should not be used as a sole basis for treatment. Nasal washings and aspirates are unacceptable for Xpert Xpress SARS-CoV-2/FLU/RSV testing.  Fact Sheet for Patients: BloggerCourse.com  Fact Sheet for Healthcare Providers: SeriousBroker.it  This test is not yet  approved or cleared by the [place] FDA and has been authorized for detection and/or diagnosis of SARS-CoV-2 by FDA under an Emergency Use Authorization (EUA). This EUA will remain in effect (meaning this test can be used) for the duration of the COVID-19 declaration under Section 564(b)(1) of the Act, 21 U.S.C. section 360bbb-3(b)(1), unless the authorization is terminated or revoked.  Performed at [facility], [address] [place], [place] [address]   Urine Culture     Status: None   Collection Time: 04/21/21 10:25 PM   Specimen: In/Out Cath Urine  Result Value Ref Range Status   Specimen Description   Final    IN/OUT CATH URINE Performed at [facility], [address] [place], [place] [address]    Special Requests   Final    NONE Performed at [facility], [address] [place], [place] [address]    Culture   Final    NO GROWTH Performed at [facility], [address], [place], [place] [address]    Report Status 04/23/2021 FINAL  Final  Aerobic/Anaerobic Culture w Gram Stain (surgical/deep wound)     Status: None (Preliminary result)   Collection Time: 04/22/21 12:29 AM   Specimen: Abscess  Result Value Ref Range Status   Specimen Description   Final    ABSCESS Performed at [facility], [address] [place], [place] [address]    Special Requests   Final    NONE Performed at [facility], [address], [place], [place] [address]    Gram Stain   Final    FEW WBC PRESENT,BOTH PMN AND MONONUCLEAR ABUNDANT GRAM POSITIVE COCCI IN CLUSTERS IN TETRADS ABUNDANT GRAM NEGATIVE RODS    Culture   Final    CULTURE REINCUBATED FOR BETTER GROWTH RARE STAPHYLOCOCCUS HOMINIS FEW PREVOTELLA BIVIA BETA LACTAMASE POSITIVE Performed at [facility], [address] [place], [place] [address]    Report Status PENDING  Incomplete     Radiology Studies: No results  found.  Scheduled Meds:  acetaminophen  1,000 mg Oral QID   amLODipine  10 mg Oral Daily   enoxaparin (LOVENOX) injection  40 mg Subcutaneous Q24H   feeding supplement  237 mL Oral BID BM   gabapentin  300 mg Oral BID   hydrALAZINE  50 mg Oral Q8H   insulin aspart  0-15 Units Subcutaneous TID WC   insulin detemir  22 Units Subcutaneous BID   labetalol       senna-docusate  1 tablet Oral BID   sodium bicarbonate  650 mg Oral BID   sucralfate  1 g Oral TID WC & HS   Continuous Infusions:  sodium chloride 1,000 mL (04/25/21 0837)   ceFEPime (MAXIPIME) IV 2 g (04/26/21 0924)   linezolid (ZYVOX) IV 600 mg (04/26/21 0430)     LOS: 4 days   Rickey Barbara, MD [facility] Pager On Amion  If 7PM-7AM, please contact night-coverage 04/26/2021, 4:29 PM

## 2021-04-26 NOTE — Anesthesia Postprocedure Evaluation (Signed)
Anesthesia Post Note  Patient: [name]  Procedure(s) Performed: reexploration and debridement of soft tissue infection right groin (Right: Groin)     Patient location during evaluation: PACU Anesthesia Type: General Level of consciousness: awake and alert Pain management: pain level controlled Vital Signs Assessment: post-procedure vital signs reviewed and stable Respiratory status: spontaneous breathing, nonlabored ventilation, respiratory function stable and patient connected to nasal cannula oxygen Cardiovascular status: blood pressure returned to baseline and stable Postop Assessment: no apparent nausea or vomiting Anesthetic complications: no   No notable events documented.  Last Vitals:  Vitals:   04/26/21 1235 04/26/21 1359  BP: (!) 199/94 (!) 188/91  Pulse: 89 96  Resp: 20 (!) 22  Temp: 36.6 C 36.9 C  SpO2: 100% 97%    Last Pain:  Vitals:   04/26/21 1359  TempSrc: Oral  PainSc:                  [name]

## 2021-04-26 NOTE — TOC Initial Note (Signed)
Transition of Care [facility]) - Initial/Assessment Note   Patient Details  Name: Bradley Davis MRN: [id number] Date of Birth: 1985/08/10  Transition of Care [facility]) CM/SW Contact:    Sherie Don, LCSW Phone Number: 04/26/2021, 3:38 PM  Clinical Narrative: [facility] consulted for medication/HH/DME assistance. CSW met with patient to discuss needs. Per patient, he resides at home alone and is independent with ADLs at baseline so he does not have or need any DME. Patient is able to transport himself to medical appointments. Patient reported he has difficulty paying for his medications out-of-pocket as he does not have insurance and may need medication assistance at discharge. TOC to follow.  Expected Discharge Plan: Home/Self Care Barriers to Discharge: Inadequate or no insurance, Continued Medical Work up  Patient Goals and CMS Choice Choice offered to / list presented to : NA  Expected Discharge Plan and Services Expected Discharge Plan: Home/Self Care In-house Referral: Clinical Social Work Discharge Planning Services: [facility] Program Post Acute Care Choice: NA Living arrangements for the past 2 months: Single Family Home            DME Arranged: N/A DME Agency: NA  Prior Living Arrangements/Services Living arrangements for the past 2 months: Single Family Home Lives with:: Self Patient language and need for interpreter reviewed:: Yes Do you feel safe going back to the place where you live?: Yes      Need for Family Participation in Patient Care: Yes (Comment) Care giver support system in place?: Yes (comment) Criminal Activity/Legal Involvement Pertinent to Current Situation/Hospitalization: No - Comment as needed  Activities of Daily Living Home Assistive Devices/Equipment: Eyeglasses ADL Screening (condition at time of admission) Patient's cognitive ability adequate to safely complete daily activities?: Yes Is the patient deaf or have difficulty hearing?: No Does the patient have  difficulty seeing, even when wearing glasses/contacts?: No Does the patient have difficulty concentrating, remembering, or making decisions?: No Patient able to express need for assistance with ADLs?: Yes Does the patient have difficulty dressing or bathing?: No Independently performs ADLs?: Yes (appropriate for developmental age) Does the patient have difficulty walking or climbing stairs?: No Weakness of Legs: Right Weakness of Arms/Hands: None  Emotional Assessment Appearance:: Appears stated age Attitude/Demeanor/Rapport: Engaged Affect (typically observed): Accepting Orientation: : Oriented to Self, Oriented to Place, Oriented to  Time, Oriented to Situation Alcohol / Substance Use: Not Applicable Psych Involvement: No (comment)  Admission diagnosis:  Necrotizing fasciitis ([place]) [M72.6] Abscess of groin [L02.214] Sepsis, due to unspecified organism, unspecified whether acute organ dysfunction present [facility]) [A41.9] Cellulitis and abscess of right leg [L03.115, L02.415] Patient Active Problem List   Diagnosis Date Noted   DM (diabetes mellitus), type 1 with renal complications ([place]) 28/41/3244   HTN (hypertension) 04/23/2021   Acute renal failure superimposed on stage 2 chronic kidney disease ([place]) 04/23/2021   Hypokalemia 04/23/2021   Abscess of groin    Cellulitis and abscess of right leg 04/22/2021   Necrotizing fasciitis ([place]) 04/22/2021   PCP:  Kristopher Glee., MD Pharmacy:   [facility], [address] [place] [address] Phone: [phone] Fax: [phone]  [id number] [place]: [phone]: [phone] [facility] [address] [place]Phone: [phone]: [phone] Readmission Risk Interventions No flowsheet data  found.

## 2021-04-27 ENCOUNTER — Encounter (HOSPITAL_COMMUNITY): Payer: Self-pay | Admitting: General Surgery

## 2021-04-27 LAB — RENAL FUNCTION PANEL
Albumin: 1.9 g/dL — ABNORMAL LOW (ref 3.5–5.0)
Anion gap: 6 (ref 5–15)
BUN: 18 mg/dL (ref 6–20)
CO2: 22 mmol/L (ref 22–32)
Calcium: 8 mg/dL — ABNORMAL LOW (ref 8.9–10.3)
Chloride: 107 mmol/L (ref 98–111)
Creatinine, Ser: 1.33 mg/dL — ABNORMAL HIGH (ref 0.61–1.24)
GFR, Estimated: >60 mL/min (ref >60)
Glucose, Bld: 164 mg/dL — ABNORMAL HIGH (ref 70–99)
Phosphorus: 3.6 mg/dL (ref 2.5–4.6)
Potassium: 3.7 mmol/L (ref 3.5–5.1)
Sodium: 135 mmol/L (ref 135–145)

## 2021-04-27 LAB — CBC WITH DIFFERENTIAL/PLATELET
Abs Immature Granulocytes: 0.46 10*3/uL — ABNORMAL HIGH (ref 0.00–0.07)
Basophils Absolute: 0.1 10*3/uL (ref 0.0–0.1)
Basophils Relative: 0 %
Eosinophils Absolute: 0.2 10*3/uL (ref 0.0–0.5)
Eosinophils Relative: 1 %
HCT: 29.8 % — ABNORMAL LOW (ref 39.0–52.0)
Hemoglobin: 9.8 g/dL — ABNORMAL LOW (ref 13.0–17.0)
Immature Granulocytes: 2 %
Lymphocytes Relative: 16 %
Lymphs Abs: 3.1 10*3/uL (ref 0.7–4.0)
MCH: 28.3 pg (ref 26.0–34.0)
MCHC: 32.9 g/dL (ref 30.0–36.0)
MCV: 86.1 fL (ref 80.0–100.0)
Monocytes Absolute: 1.3 10*3/uL — ABNORMAL HIGH (ref 0.1–1.0)
Monocytes Relative: 7 %
Neutro Abs: 14.1 10*3/uL — ABNORMAL HIGH (ref 1.7–7.7)
Neutrophils Relative %: 74 %
Platelets: 505 10*3/uL — ABNORMAL HIGH (ref 150–400)
RBC: 3.46 MIL/uL — ABNORMAL LOW (ref 4.22–5.81)
RDW: 14.9 % (ref 11.5–15.5)
WBC: 19.3 10*3/uL — ABNORMAL HIGH (ref 4.0–10.5)
nRBC: 0 % (ref 0.0–0.2)

## 2021-04-27 LAB — GLUCOSE, CAPILLARY
Glucose-Capillary: 154 mg/dL — ABNORMAL HIGH (ref 70–99)
Glucose-Capillary: 140 mg/dL — ABNORMAL HIGH (ref 70–99)
Glucose-Capillary: 84 mg/dL (ref 70–99)
Glucose-Capillary: 146 mg/dL — ABNORMAL HIGH (ref 70–99)

## 2021-04-27 LAB — AEROBIC/ANAEROBIC CULTURE W GRAM STAIN (SURGICAL/DEEP WOUND)

## 2021-04-27 MED ORDER — HYDRALAZINE HCL 20 MG/ML IJ SOLN
10.0000 mg | Freq: Four times a day (QID) | INTRAMUSCULAR | Status: DC | PRN
  Administered 2021-04-27 – 2021-04-28 (×2): 10 mg via INTRAVENOUS
  Filled 2021-04-27 (×2): qty 1

## 2021-04-27 MED ORDER — HYDRALAZINE HCL 50 MG PO TABS
100.0000 mg | ORAL_TABLET | Freq: Three times a day (TID) | ORAL | Status: DC
  Administered 2021-04-27 – 2021-05-01 (×11): 100 mg via ORAL
  Filled 2021-04-27 (×13): qty 2

## 2021-04-27 NOTE — Progress Notes (Signed)
PROGRESS NOTE    Bradley Davis  QMV:[id number] DOB: Jul 22, 1985 DOA: 04/21/2021 PCP: Andreas Blower., MD    Brief Narrative:  35 year old gentleman history of CKD 2 concern for progression to CKD 3A, diabetes mellitus type 2, hypertension, proteinuria presented with right groin pain started 6 days prior to admission evaluated by general surgery, CT pelvis done and patient admitted with concerns for necrotizing fasciitis of the right groin area.  Patient subsequently underwent initial I and D per general surgery, TRH consulted for management for acute kidney injury on chronic kidney disease, management of diabetes, hypertension.  Patient went back to the OR on 04/23/2021 and again on 04/26/2021 for further I and D  Assessment & Plan:   Principal Problem:   Necrotizing fasciitis (H[facility]) Active Problems:   Cellulitis and abscess of right leg   DM (diabetes mellitus), type 1 with renal complications (HCC)   HTN (hypertension)   Acute renal failure superimposed on stage 2 chronic kidney disease (H[facility])   Hypokalemia   Abscess of groin    1 necrotizing fasciitis of the right groin/thigh -Patient admitted to the general surgical service underwent incision, drainage, debridement of right thigh/groin necrotizing fasciitis with cultures obtained currently pending. -Blood cultures thus far without growth x5 days -Currently afebrile, T-max of 37.8 C at 6:35 AM this morning -Leukocytosis had initially trended down, up to 18.6 today -Patient undergoing incision and drainage per general surgery, on 04/23/2021 and again this morning. -IV Vanco and IV Zosyn was earlier changed to IV cefepime and IV Zyvox due to acute on chronic renal failure.  -Continue per primary team/general surgery.  2.  Acute renal failure on chronic kidney disease stage II -Initial baseline creatinine 1.3-1.5 per primary nephrologist note from 06/07/2021. -Concern for possible progression of chronic kidney disease. -Acute renal  failure on chronic kidney disease likely multifactorial secondary to acute infection with necrotizing fasciitis of the right groin/thigh, concern for possible rhabdomyolysis from acute infection/injury in the setting of dehydration and in the setting of ACE inhibitor. -Renal ultrasound with unremarkable sonographic appearance of bilateral kidneys. -Urinalysis with glycosuria, nitrite negative, leukocytes negative, proteinuria. -recent Urine sodium at 13, urine creatinine at 112.45. -CK level at 769.   -Received a 500 cc bolus of normal saline on 04/23/2021 and IV fluids increased to 150 cc an hour.   -Creatinine is down to 1.33 today   -Due to concern for volume overload and hypoalbuminemia, patient was given one dose of IV albumin followed by Lasix 20 mg x 1.  -Was on IV vancomycin, Zosyn with continued worsening of renal function and that has been discontinued and patient currently on IV Zyvox and cefepime.  -Repeat basic metabolic panel in the morning  3.  Poorly controlled diabetes mellitus type 1 -Patient noted to be on insulin pump being followed by endocrinology in the outpatient setting. On further questioning, pt has not been using insulin pump as directed secondary to cost of supplies. He has instead been self-injecting short acting insulin as needed, which was intended to be used with pump -Hemoglobin A1c 14.4 (04/23/2021). -Pt is currently on Levemir 22 units twice daily with SSI coverage -Recommend prescribing current long acting and short acting coverage at time of d/c to cover pt until he can f/u with Endocrine as outpt   -Glycemic trends reviewed, stable and remains relatively well-controlled -Diabetes coordinator following.    4.  Hypertension -ACE inhibitor on hold secondary to problem #2. -Norvasc increased to 10 mg daily.   -BP  elevated and as such we will increase hydralazine to 50 mg p.o. 3 times daily.   -Recommend continuing with aggressive analgesia -Continue with  hydralazine as needed.   5.  Hypokalemia -Replaced -Continue to follow basic metabolic panel  6.  Hyponatremia -Likely secondary to volume depletion. -Improved with hydration.  7.  Lower extremity edema Patient with lower extremity edema right greater than left with some tightness in his thigh.   -Patient with albumin of 2.2.   -Patient was earlier given IV albumin 25 g x 1.  Lasix 20 mg IV x1.     DVT prophylaxis: Lovenox subq Code Status: Full Family Communication: Pt in room, family currently at bedside  Status is: Inpatient  Remains inpatient appropriate because: severity of illness  Consultants:  [facility] hospitalist: Dr. Ronaldo Miyamoto 04/22/2021   Procedures:  CT pelvis 04/21/2021 Chest x-ray 04/21/2021 Renal ultrasound 04/22/2021 Incision, drainage, debridement of right thigh/groin necrotizing fasciitis per Dr. Magnus Ivan, general surgery 04/22/2021 Irrigation and debridement right groin wound, right thigh and potential further debridement per Dr. Magnus Ivan 04/23/2021      Antimicrobials:  IV vancomycin 04/21/2021>>>> 04/23/2021 IV Zosyn 04/21/2021>>>>> 04/23/2021 IV cefepime 04/23/2021>>>> IV Zyvox 04/23/2021>>>>>>  Antimicrobials: Anti-infectives (From admission, onward)    Start     Dose/Rate Route Frequency Ordered Stop   04/24/21 1000  ceFEPIme (MAXIPIME) 2 g in sodium chloride 0.9 % 100 mL IVPB        2 g 200 mL/hr over 30 Minutes Intravenous Every 8 hours 04/24/21 0925     04/23/21 1400  linezolid (ZYVOX) IVPB 600 mg       Note to Pharmacy: Pharm to adjust dose   600 mg 300 mL/hr over 60 Minutes Intravenous Every 12 hours 04/23/21 1123     04/23/21 1300  ceFEPIme (MAXIPIME) 2 g in sodium chloride 0.9 % 100 mL IVPB  Status:  Discontinued        2 g 200 mL/hr over 30 Minutes Intravenous Every 12 hours 04/23/21 1236 04/24/21 0925   04/23/21 0800  vancomycin (VANCOCIN) IVPB 1000 mg/200 mL premix  Status:  Discontinued        1,000 mg 200 mL/hr over 60 Minutes  Intravenous Every 24 hours 04/22/21 0254 04/22/21 0306   04/22/21 0800  vancomycin (VANCOCIN) IVPB 1000 mg/200 mL premix  Status:  Discontinued        1,000 mg 200 mL/hr over 60 Minutes Intravenous Every 24 hours 04/22/21 0306 04/23/21 1123   04/22/21 0400  piperacillin-tazobactam (ZOSYN) IVPB 3.375 g  Status:  Discontinued        3.375 g 12.5 mL/hr over 240 Minutes Intravenous Every 8 hours 04/22/21 0254 04/23/21 1123   04/21/21 1900  piperacillin-tazobactam (ZOSYN) IVPB 3.375 g        3.375 g 100 mL/hr over 30 Minutes Intravenous  Once 04/21/21 1846 04/21/21 2010   04/21/21 1845  vancomycin (VANCOCIN) IVPB 1000 mg/200 mL premix        1,000 mg 200 mL/hr over 60 Minutes Intravenous  Once 04/21/21 1843 04/21/21 2124   04/21/21 1845  ceFEPIme (MAXIPIME) 2 g in sodium chloride 0.9 % 100 mL IVPB  Status:  Discontinued        2 g 200 mL/hr over 30 Minutes Intravenous  Once 04/21/21 1843 04/21/21 1846   04/21/21 1815  clindamycin (CLEOCIN) IVPB 600 mg        600 mg 100 mL/hr over 30 Minutes Intravenous  Once 04/21/21 1802 04/21/21 1908       Subjective:  Complaining of continued wound pain, still 6-7/10 even at rest  Objective: Vitals:   04/26/21 1603 04/26/21 1648 04/26/21 2133 04/27/21 0501  BP: (!) 169/75 (!) 173/80 (!) 181/75 (!) 172/84  Pulse: (!) 101 93 (!) 106 93  Resp: (!) 21 (!) 23 18 18   Temp: 98.7 F (37.1 C) 98.9 F (37.2 C) 99.7 F (37.6 C) (!) 97.4 F (36.3 C)  TempSrc: Oral Oral Oral Oral  SpO2: 92% 96% 98% 100%  Weight:      Height:        Intake/Output Summary (Last 24 hours) at 04/27/2021 1401 Last data filed at 04/27/2021 1227 Gross per 24 hour  Intake 2290.06 ml  Output 4100 ml  Net -1809.94 ml    Filed Weights   04/21/21 1504 04/26/21 0942  Weight: 111.6 kg 111.6 kg    Examination: General exam: Conversant, in no acute distress Respiratory system: normal chest rise, clear, no audible wheezing Cardiovascular system: regular rhythm,  s1-s2 Gastrointestinal system: Nondistended, nontender, pos BS Central nervous system: No seizures, no tremors Extremities: No cyanosis, no joint deformities Skin: No rashes, no pallor, open wound in R groin seen with General Surgery in room Psychiatry: Affect normal // no auditory hallucinations   Data Reviewed: I have personally reviewed following labs and imaging studies  CBC: Recent Labs  Lab 04/21/21 1748 04/22/21 0658 04/23/21 0310 04/24/21 0729 04/25/21 0325 04/26/21 0341 04/27/21 0335  WBC 15.0*   < > 16.1* 14.7* 13.2* 18.6* 19.3*  NEUTROABS 12.7*  --   --  11.6* 8.3* 12.5* 14.1*  HGB 11.2*   < > 10.7* 10.4* 10.1* 10.2* 9.8*  HCT 32.6*   < > 33.2* 31.5* 31.6* 31.4* 29.8*  MCV 81.3   < > 85.1 83.8 85.6 86.5 86.1  PLT 273   < > 358 336 460* 464* 505*   < > = values in this interval not displayed.    Basic Metabolic Panel: Recent Labs  Lab 04/23/21 0310 04/23/21 0750 04/24/21 0729 04/25/21 0325 04/26/21 0341 04/27/21 0335  NA 132*  --  130* 133* 134* 135  K 3.3*  --  4.7 3.9 3.8 3.7  CL 102  --  104 106 107 107  CO2 22  --  19* 19* 18* 22  GLUCOSE 135*  --  212* 178* 137* 164*  BUN 36*  --  35* 27* 21* 18  CREATININE 3.17*  --  1.94* 1.71* 1.64* 1.33*  CALCIUM 7.6*  --  7.9* 7.9* 8.0* 8.0*  MG  --  2.0  --   --  1.8  --   PHOS  --   --  2.6 2.3* 3.0 3.6    GFR: Estimated Creatinine Clearance: 96.9 mL/min (A) (by C-G formula based on SCr of 1.33 mg/dL (H)). Liver Function Tests: Recent Labs  Lab 04/21/21 1748 04/24/21 0729 04/25/21 0325 04/26/21 0341 04/27/21 0335  AST 44*  --   --   --   --   ALT 59*  --   --   --   --   ALKPHOS 126  --   --   --   --   BILITOT 0.7  --   --   --   --   PROT 6.3*  --   --   --   --   ALBUMIN 2.4* 1.8* 2.2* 2.2* 1.9*    No results for input(s): LIPASE, AMYLASE in the last 168 hours. No results for input(s): AMMONIA in the last 168 hours.  Coagulation Profile: Recent Labs  Lab 04/21/21 1905  INR 1.3*     Cardiac Enzymes: Recent Labs  Lab 04/23/21 0840  CKTOTAL 769*    BNP (last 3 results) No results for input(s): PROBNP in the last 8760 hours. HbA1C: No results for input(s): HGBA1C in the last 72 hours. CBG: Recent Labs  Lab 04/26/21 1101 04/26/21 1644 04/26/21 2137 04/27/21 0729 04/27/21 1137  GLUCAP 111* 187* 205* 154* 140*    Lipid Profile: No results for input(s): CHOL, HDL, LDLCALC, TRIG, CHOLHDL, LDLDIRECT in the last 72 hours. Thyroid Function Tests: No results for input(s): TSH, T4TOTAL, FREET4, T3FREE, THYROIDAB in the last 72 hours. Anemia Panel: No results for input(s): VITAMINB12, FOLATE, FERRITIN, TIBC, IRON, RETICCTPCT in the last 72 hours. Sepsis Labs: Recent Labs  Lab 04/21/21 1748 04/21/21 2023  LATICACIDVEN 2.6* 2.3*     Recent Results (from the past 240 hour(s))  Blood culture (routine x 2)     Status: None   Collection Time: 04/21/21  5:45 PM   Specimen: BLOOD  Result Value Ref Range Status   Specimen Description   Final    BLOOD BLOOD LEFT FOREARM Performed at [facility], [address] [place], [place] [address]    Special Requests   Final    BOTTLES DRAWN AEROBIC AND ANAEROBIC Blood Culture adequate volume Performed at [facility], [address] [place], [place] [address]    Culture   Final    NO GROWTH 5 DAYS Performed at [facility], [address] [place], [place] [address]    Report Status 04/26/2021 FINAL  Final  Blood culture (routine x 2)     Status: None   Collection Time: 04/21/21  6:30 PM   Specimen: BLOOD  Result Value Ref Range Status   Specimen Description   Final    BLOOD RIGHT ANTECUBITAL Performed at [facility], [address] [place], [place] [address]    Special Requests   Final    BOTTLES DRAWN AEROBIC AND ANAEROBIC Blood Culture results may not be optimal due to an excessive volume of blood received in culture bottles Performed at [facility],  [address] [place], [place] [address]    Culture   Final    NO GROWTH 5 DAYS Performed at [facility], [address], [place], [place] [address]    Report Status 04/26/2021 FINAL  Final  Resp Panel by RT-PCR (Flu A&B, Covid) Nasopharyngeal Swab     Status: None   Collection Time: 04/21/21  6:45 PM   Specimen: Nasopharyngeal Swab; Nasopharyngeal(NP) swabs in vial transport medium  Result Value Ref Range Status   SARS Coronavirus 2 by RT PCR NEGATIVE NEGATIVE Final    Comment: (NOTE) SARS-CoV-2 target nucleic acids are NOT DETECTED.  The SARS-CoV-2 RNA is generally detectable in upper respiratory specimens during the acute phase of infection. The lowest concentration of SARS-CoV-2 viral copies this assay can detect is 138 copies/mL. A negative result does not preclude SARS-Cov-2 infection and should not be used as the sole basis for treatment or other patient management decisions. A negative result may occur with  improper specimen collection/handling, submission of specimen other than nasopharyngeal swab, presence of viral mutation(s) within the areas targeted by this assay, and inadequate number of viral copies(<138 copies/mL). A negative result must be combined with clinical observations, patient history, and epidemiological information. The expected result is Negative.  Fact  Sheet for Patients:  BloggerCourse.com  Fact Sheet for Healthcare Providers:  SeriousBroker.it  This test is no t yet approved or cleared by the [place] FDA and  has been authorized for detection and/or diagnosis of SARS-CoV-2 by FDA under an Emergency Use Authorization (EUA). This EUA will remain  in effect (meaning this test can be used) for the duration of the COVID-19 declaration under Section 564(b)(1) of the Act, 21 U.S.C.section 360bbb-3(b)(1), unless the authorization is terminated  or revoked sooner.       Influenza A by  PCR NEGATIVE NEGATIVE Final   Influenza B by PCR NEGATIVE NEGATIVE Final    Comment: (NOTE) The Xpert Xpress SARS-CoV-2/FLU/RSV plus assay is intended as an aid in the diagnosis of influenza from Nasopharyngeal swab specimens and should not be used as a sole basis for treatment. Nasal washings and aspirates are unacceptable for Xpert Xpress SARS-CoV-2/FLU/RSV testing.  Fact Sheet for Patients: BloggerCourse.com  Fact Sheet for Healthcare Providers: SeriousBroker.it  This test is not yet approved or cleared by the [place] FDA and has been authorized for detection and/or diagnosis of SARS-CoV-2 by FDA under an Emergency Use Authorization (EUA). This EUA will remain in effect (meaning this test can be used) for the duration of the COVID-19 declaration under Section 564(b)(1) of the Act, 21 U.S.C. section 360bbb-3(b)(1), unless the authorization is terminated or revoked.  Performed at [facility], [address], [place], [place] [address]   Urine Culture     Status: None   Collection Time: 04/21/21 10:25 PM   Specimen: In/Out Cath Urine  Result Value Ref Range Status   Specimen Description   Final    IN/OUT CATH URINE Performed at [facility], [address] [place], [place] [address]    Special Requests   Final    NONE Performed at [facility], [address] [place], [place] [address]    Culture   Final    NO GROWTH Performed at [facility], [address] [place], [place] [address]    Report Status 04/23/2021 FINAL  Final  Aerobic/Anaerobic Culture w Gram Stain (surgical/deep wound)     Status: None (Preliminary result)   Collection Time: 04/22/21 12:29 AM   Specimen: Abscess  Result Value Ref Range Status   Specimen Description   Final    ABSCESS Performed at [facility], [address] [place], [place] [address]    Special Requests   Final     NONE Performed at [facility], [address], [place], [place] [address]    Gram Stain   Final    FEW WBC PRESENT,BOTH PMN AND MONONUCLEAR ABUNDANT GRAM POSITIVE COCCI IN CLUSTERS IN TETRADS ABUNDANT GRAM NEGATIVE RODS    Culture   Final    RARE ACTINOMYCES SPECIES RARE STAPHYLOCOCCUS HOMINIS FEW PREVOTELLA BIVIA BETA LACTAMASE POSITIVE Standardized susceptibility testing for this organism is not available. FOR ACTINOMYCES SPECIES Performed at [facility], [address], [place], [place] [address]    Report Status PENDING  Incomplete      Radiology Studies: No results found.  Scheduled Meds:  acetaminophen  1,000 mg Oral QID   amLODipine  10 mg Oral Daily   enoxaparin (LOVENOX) injection  40 mg Subcutaneous Q24H   feeding supplement  237 mL Oral BID BM   gabapentin  300 mg Oral BID   hydrALAZINE  50 mg Oral Q8H  insulin aspart  0-15 Units Subcutaneous TID WC   insulin detemir  22 Units Subcutaneous BID   senna-docusate  1 tablet Oral BID   sucralfate  1 g Oral TID WC & HS   Continuous Infusions:  sodium chloride 1,000 mL (04/25/21 0837)   ceFEPime (MAXIPIME) IV 2 g (04/27/21 0836)   linezolid (ZYVOX) IV 600 mg (04/27/21 0402)     LOS: 5 days   Rickey Barbara, MD [facility] Pager On Amion  If 7PM-7AM, please contact night-coverage 04/27/2021, 2:01 PM

## 2021-04-27 NOTE — Plan of Care (Signed)
  Problem: Clinical Measurements: Goal: Ability to maintain clinical measurements within normal limits will improve Outcome: Progressing   Problem: Clinical Measurements: Goal: Will remain free from infection Outcome: Progressing   Problem: Coping: Goal: Level of anxiety will decrease Outcome: Progressing   

## 2021-04-27 NOTE — TOC Progression Note (Signed)
Transition of Care [facility]) - Progression Note   Patient Details  Name: [name] MRN: [id number] Date of Birth: July 24, 1985  [facility]) CM/SW Contact  Ewing Schlein, LCSW Phone Number: 04/27/2021, 3:19 PM  Clinical Narrative: Patient will need medication assistance after discharge on a regular basis. CSW added information for [facility] program to patient's AVS and explained the program to patient. TOC to follow.  Expected Discharge Plan: Home/Self Care Barriers to Discharge: Inadequate or no insurance, Continued Medical Work up  Expected Discharge Plan and Services Expected Discharge Plan: Home/Self Care In-house Referral: Clinical Social Work Discharge Planning Services: [facility] Program Post Acute Care Choice: NA Living arrangements for the past 2 months: Single Family Home          DME Arranged: N/A DME Agency: NA  Readmission Risk Interventions No flowsheet data found.

## 2021-04-27 NOTE — Progress Notes (Signed)
   04/27/21 1446  Assess: MEWS Score  Temp 98.7 F (37.1 C)  BP (!) 213/109  Pulse Rate 95  Resp 16  SpO2 98 %  O2 Device Room Air  Assess: MEWS Score  MEWS Temp 0  MEWS Systolic 2  MEWS Pulse 0  MEWS RR 0  MEWS LOC 0  MEWS Score 2  MEWS Score Color Yellow  Assess: if the MEWS score is Yellow or Red  Were vital signs taken at a resting state? Yes  Focused Assessment Change from prior assessment (see assessment flowsheet)  Does the patient meet 2 or more of the SIRS criteria? No  Does the patient have a confirmed or suspected source of infection? Yes  Provider and Rapid Response Notified? Yes  MEWS guidelines implemented *See Row Information* Yes  Treat  MEWS Interventions Administered scheduled meds/treatments;Administered prn meds/treatments  Take Vital Signs  Increase Vital Sign Frequency  Yellow: Q 2hr X 2 then Q 4hr X 2, if remains yellow, continue Q 4hrs  Escalate  MEWS: Escalate Yellow: discuss with charge nurse/RN and consider discussing with provider and RRT  Notify: Charge Nurse/RN  Name of Charge Nurse/RN Notified Marni Griffon, RN  Date Charge Nurse/RN Notified 04/27/21  Time Charge Nurse/RN Notified 1446  Notify: Provider  Provider Name/Title Dr. Rickey Barbara  Date Provider Notified 04/27/21  Time Provider Notified 1450  Notification Type Page  Notification Reason Change in status  Provider response See new orders  Date of Provider Response 04/27/21  Time of Provider Response 1451  Assess: SIRS CRITERIA  SIRS Temperature  0  SIRS Pulse 1  SIRS Respirations  0  SIRS WBC 0  SIRS Score Sum  1

## 2021-04-27 NOTE — Progress Notes (Signed)
Patient ID: Bradley Davis, male   DOB: 06/05/1986, 35 y.o.   MRN: [id number] [facility] Progress Note  1 Day Post-Op  Subjective: CC-  Ready for first dressing change. Having some discomfort at surgical site. WBC up 19.3, afebrile.  Objective: Vital signs in last 24 hours: Temp:  [97.4 F (36.3 C)-99.7 F (37.6 C)] 97.4 F (36.3 C) (10/20 0501) Pulse Rate:  [85-106] 93 (10/20 0501) Resp:  [12-23] 18 (10/20 0501) BP: (168-199)/(75-95) 172/84 (10/20 0501) SpO2:  [90 %-100 %] 100 % (10/20 0501) Last BM Date: 04/25/21  Intake/Output from previous day: 10/19 0701 - 10/20 0700 In: 3530.1 [P.O.:1320; I.V.:1100; IV Piggyback:1110.1] Out: 34 [Urine:4500; Blood:5] Intake/Output this shift: Total I/O In: 100 [IV Piggyback:100] Out: 44 [Urine:800]  PE: Gen:  Alert, NAD, pleasant Pulm: CTA b/l, on RA Cards: Reg Abd: Soft, NT/ND Ext:  right groin wound appears clean, no active purulent drainage. Wound measures ~15cm with ~5cm of tracking at medial inferior edge. Right mid-thigh wound ~5x3cm appears clean without purulent drainage. Surrounding induration improving. Penrose in place from 1 wound to the other      Lab Results:  Recent Labs    04/26/21 0341 04/27/21 0335  WBC 18.6* 19.3*  HGB 10.2* 9.8*  HCT 31.4* 29.8*  PLT 464* 505*   BMET Recent Labs    04/26/21 0341 04/27/21 0335  NA 134* 135  K 3.8 3.7  CL 107 107  CO2 18* 22  GLUCOSE 137* 164*  BUN 21* 18  CREATININE 1.64* 1.33*  CALCIUM 8.0* 8.0*   PT/INR No results for input(s): LABPROT, INR in the last 72 hours. CMP     Component Value Date/Time   NA 135 04/27/2021 0335   K 3.7 04/27/2021 0335   CL 107 04/27/2021 0335   CO2 22 04/27/2021 0335   GLUCOSE 164 (H) 04/27/2021 0335   BUN 18 04/27/2021 0335   CREATININE 1.33 (H) 04/27/2021 0335   CALCIUM 8.0 (L) 04/27/2021 0335   PROT 6.3 (L) 04/21/2021 1748   ALBUMIN 1.9 (L) 04/27/2021 0335   AST 44 (H) 04/21/2021 1748   ALT 59 (H)  04/21/2021 1748   ALKPHOS 126 04/21/2021 1748   BILITOT 0.7 04/21/2021 1748   GFRNONAA >60 04/27/2021 0335   GFRAA  02/14/2008 1530    >60        The eGFR has been calculated using the MDRD equation. This calculation has not been validated in all clinical   Lipase  No results found for: LIPASE     Studies/Results: No results found.  Anti-infectives: Anti-infectives (From admission, onward)    Start     Dose/Rate Route Frequency Ordered Stop   04/24/21 1000  ceFEPIme (MAXIPIME) 2 g in sodium chloride 0.9 % 100 mL IVPB        2 g 200 mL/hr over 30 Minutes Intravenous Every 8 hours 04/24/21 0925     04/23/21 1400  linezolid (ZYVOX) IVPB 600 mg       Note to Pharmacy: Pharm to adjust dose   600 mg 300 mL/hr over 60 Minutes Intravenous Every 12 hours 04/23/21 1123     04/23/21 1300  ceFEPIme (MAXIPIME) 2 g in sodium chloride 0.9 % 100 mL IVPB  Status:  Discontinued        2 g 200 mL/hr over 30 Minutes Intravenous Every 12 hours 04/23/21 1236 04/24/21 0925   04/23/21 0800  vancomycin (VANCOCIN) IVPB 1000 mg/200 mL premix  Status:  Discontinued  1,000 mg 200 mL/hr over 60 Minutes Intravenous Every 24 hours 04/22/21 0254 04/22/21 0306   04/22/21 0800  vancomycin (VANCOCIN) IVPB 1000 mg/200 mL premix  Status:  Discontinued        1,000 mg 200 mL/hr over 60 Minutes Intravenous Every 24 hours 04/22/21 0306 04/23/21 1123   04/22/21 0400  piperacillin-tazobactam (ZOSYN) IVPB 3.375 g  Status:  Discontinued        3.375 g 12.5 mL/hr over 240 Minutes Intravenous Every 8 hours 04/22/21 0254 04/23/21 1123   04/21/21 1900  piperacillin-tazobactam (ZOSYN) IVPB 3.375 g        3.375 g 100 mL/hr over 30 Minutes Intravenous  Once 04/21/21 1846 04/21/21 2010   04/21/21 1845  vancomycin (VANCOCIN) IVPB 1000 mg/200 mL premix        1,000 mg 200 mL/hr over 60 Minutes Intravenous  Once 04/21/21 1843 04/21/21 2124   04/21/21 1845  ceFEPIme (MAXIPIME) 2 g in sodium chloride 0.9 % 100 mL IVPB   Status:  Discontinued        2 g 200 mL/hr over 30 Minutes Intravenous  Once 04/21/21 1843 04/21/21 1846   04/21/21 1815  clindamycin (CLEOCIN) IVPB 600 mg        600 mg 100 mL/hr over 30 Minutes Intravenous  Once 04/21/21 1802 04/21/21 1908        Assessment/Plan POD# 1&4&5 for [place]; RIGHT THIGH 10/15 and 10/16 Dr. Ninfa Linden and 10/19 Dr. Donne Hazel for Necrotizing fascitis - WBC up from yesterday but he is afebrile and wounds clean today, induration improving. Repeat CBC in AM - keep penrose drain in place - BID wet to dry dressing changes (pack both openings each with a piece of kerlex), shower with wounds open - Culture pending, re-incubated  - Continue broad spectrum antibiotics and follow cultures.  - Maximize blood sugar control to aid in healing process. 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

## 2021-04-27 NOTE — Plan of Care (Signed)
  Problem: Activity: Goal: Risk for activity intolerance will decrease Outcome: Progressing   Problem: Pain Managment: Goal: General experience of comfort will improve Outcome: Progressing   Problem: Safety: Goal: Ability to remain free from injury will improve Outcome: Progressing   

## 2021-04-28 LAB — CBC WITH DIFFERENTIAL/PLATELET
Abs Immature Granulocytes: 0.2 10*3/uL — ABNORMAL HIGH (ref 0.00–0.07)
Basophils Absolute: 0.1 10*3/uL (ref 0.0–0.1)
Basophils Relative: 0 %
Eosinophils Absolute: 0.3 10*3/uL (ref 0.0–0.5)
Eosinophils Relative: 2 %
HCT: 30.3 % — ABNORMAL LOW (ref 39.0–52.0)
Hemoglobin: 9.7 g/dL — ABNORMAL LOW (ref 13.0–17.0)
Immature Granulocytes: 2 %
Lymphocytes Relative: 24 %
Lymphs Abs: 3.1 10*3/uL (ref 0.7–4.0)
MCH: 27.8 pg (ref 26.0–34.0)
MCHC: 32 g/dL (ref 30.0–36.0)
MCV: 86.8 fL (ref 80.0–100.0)
Monocytes Absolute: 1 10*3/uL (ref 0.1–1.0)
Monocytes Relative: 8 %
Neutro Abs: 8.2 10*3/uL — ABNORMAL HIGH (ref 1.7–7.7)
Neutrophils Relative %: 64 %
Platelets: 507 10*3/uL — ABNORMAL HIGH (ref 150–400)
RBC: 3.49 MIL/uL — ABNORMAL LOW (ref 4.22–5.81)
RDW: 15 % (ref 11.5–15.5)
WBC: 12.9 10*3/uL — ABNORMAL HIGH (ref 4.0–10.5)
nRBC: 0 % (ref 0.0–0.2)

## 2021-04-28 LAB — RENAL FUNCTION PANEL
Albumin: 2.2 g/dL — ABNORMAL LOW (ref 3.5–5.0)
Anion gap: 9 (ref 5–15)
BUN: 14 mg/dL (ref 6–20)
CO2: 21 mmol/L — ABNORMAL LOW (ref 22–32)
Calcium: 8.2 mg/dL — ABNORMAL LOW (ref 8.9–10.3)
Chloride: 103 mmol/L (ref 98–111)
Creatinine, Ser: 1.54 mg/dL — ABNORMAL HIGH (ref 0.61–1.24)
GFR, Estimated: 60 mL/min — ABNORMAL LOW (ref >60)
Glucose, Bld: 123 mg/dL — ABNORMAL HIGH (ref 70–99)
Phosphorus: 3.5 mg/dL (ref 2.5–4.6)
Potassium: 3.6 mmol/L (ref 3.5–5.1)
Sodium: 133 mmol/L — ABNORMAL LOW (ref 135–145)

## 2021-04-28 LAB — BASIC METABOLIC PANEL
Anion gap: 9 (ref 5–15)
BUN: 15 mg/dL (ref 6–20)
CO2: 21 mmol/L — ABNORMAL LOW (ref 22–32)
Calcium: 8.4 mg/dL — ABNORMAL LOW (ref 8.9–10.3)
Chloride: 103 mmol/L (ref 98–111)
Creatinine, Ser: 1.58 mg/dL — ABNORMAL HIGH (ref 0.61–1.24)
GFR, Estimated: 58 mL/min — ABNORMAL LOW (ref >60)
Glucose, Bld: 281 mg/dL — ABNORMAL HIGH (ref 70–99)
Potassium: 3.9 mmol/L (ref 3.5–5.1)
Sodium: 133 mmol/L — ABNORMAL LOW (ref 135–145)

## 2021-04-28 LAB — GLUCOSE, CAPILLARY
Glucose-Capillary: 93 mg/dL (ref 70–99)
Glucose-Capillary: 167 mg/dL — ABNORMAL HIGH (ref 70–99)

## 2021-04-28 MED ORDER — HYDROMORPHONE HCL 1 MG/ML IJ SOLN
0.5000 mg | INTRAMUSCULAR | Status: DC | PRN
  Administered 2021-04-29: 1 mg via INTRAVENOUS
  Filled 2021-04-28 (×2): qty 1

## 2021-04-28 MED ORDER — BENAZEPRIL HCL 10 MG PO TABS
10.0000 mg | ORAL_TABLET | Freq: Every day | ORAL | Status: DC
  Administered 2021-04-28 – 2021-04-29 (×2): 10 mg via ORAL
  Filled 2021-04-28 (×2): qty 1

## 2021-04-28 MED ORDER — AMOXICILLIN-POT CLAVULANATE 875-125 MG PO TABS
1.0000 | ORAL_TABLET | Freq: Two times a day (BID) | ORAL | Status: DC
  Administered 2021-04-28 – 2021-05-01 (×6): 1 via ORAL
  Filled 2021-04-28 (×6): qty 1

## 2021-04-28 MED ORDER — AMOXICILLIN 500 MG PO CAPS: 500.0000 mg | ORAL_CAPSULE | Freq: Three times a day (TID) | ORAL | Status: DC

## 2021-04-28 MED ORDER — METHOCARBAMOL 500 MG PO TABS
500.0000 mg | ORAL_TABLET | Freq: Four times a day (QID) | ORAL | Status: DC
  Administered 2021-04-28 – 2021-05-01 (×12): 500 mg via ORAL
  Filled 2021-04-28 (×13): qty 1

## 2021-04-28 MED ORDER — SODIUM CHLORIDE 0.9 % IV SOLN
2.0000 g | Freq: Once | INTRAVENOUS | Status: AC
  Administered 2021-04-28: 2 g via INTRAVENOUS
  Filled 2021-04-28: qty 2

## 2021-04-28 NOTE — Plan of Care (Signed)
  Problem: Clinical Measurements: Goal: Ability to maintain clinical measurements within normal limits will improve Outcome: Progressing   Problem: Clinical Measurements: Goal: Will remain free from infection Outcome: Progressing   Problem: Coping: Goal: Level of anxiety will decrease Outcome: Progressing   Problem: Pain Managment: Goal: General experience of comfort will improve Outcome: Progressing   

## 2021-04-28 NOTE — Progress Notes (Signed)
2 Days Post-Op  Subjective: CC:   Objective: Vital signs in last 24 hours: Temp:  [97.9 F (36.6 C)-98.9 F (37.2 C)] 98.9 F (37.2 C) (10/21 0650) Pulse Rate:  [88-99] 97 (10/21 0650) Resp:  [16-17] 16 (10/21 0650) BP: (175-213)/(91-109) 183/91 (10/21 0650) SpO2:  [92 %-100 %] 92 % (10/21 0650) Last BM Date: 04/27/21  Intake/Output from previous day: 10/20 0701 - 10/21 0700 In: 650 [P.O.:150; IV Piggyback:500] Out: 2900 [Urine:2900] Intake/Output this shift: No intake/output data recorded.  PE: Gen:  Alert, NAD, pleasant Pulm: CTA b/l, on RA Cards: Reg Abd: Soft, NT/ND Ext:  right groin wound appears clean, no active purulent drainage. Wound measures ~15cm with ~5cm of tracking at medial inferior edge. Right mid-thigh wound ~5x3cm appears clean without purulent drainage. Surrounding induration improving. Penrose in place from 1 wound to the other      Lab Results:  Recent Labs    04/27/21 0335 04/28/21 0326  WBC 19.3* 12.9*  HGB 9.8* 9.7*  HCT 29.8* 30.3*  PLT 505* 507*   BMET Recent Labs    04/27/21 0335 04/28/21 0326  NA 135 133*  K 3.7 3.6  CL 107 103  CO2 22 21*  GLUCOSE 164* 123*  BUN 18 14  CREATININE 1.33* 1.54*  CALCIUM 8.0* 8.2*   PT/INR No results for input(s): LABPROT, INR in the last 72 hours. CMP     Component Value Date/Time   NA 133 (L) 04/28/2021 0326   K 3.6 04/28/2021 0326   CL 103 04/28/2021 0326   CO2 21 (L) 04/28/2021 0326   GLUCOSE 123 (H) 04/28/2021 0326   BUN 14 04/28/2021 0326   CREATININE 1.54 (H) 04/28/2021 0326   CALCIUM 8.2 (L) 04/28/2021 0326   PROT 6.3 (L) 04/21/2021 1748   ALBUMIN 2.2 (L) 04/28/2021 0326   AST 44 (H) 04/21/2021 1748   ALT 59 (H) 04/21/2021 1748   ALKPHOS 126 04/21/2021 1748   BILITOT 0.7 04/21/2021 1748   GFRNONAA 60 (L) 04/28/2021 0326   GFRAA  02/14/2008 1530    >60        The eGFR has been calculated using the MDRD equation. This calculation has not been validated in all  clinical   Lipase  No results found for: LIPASE  Studies/Results: No results found.  Anti-infectives: Anti-infectives (From admission, onward)    Start     Dose/Rate Route Frequency Ordered Stop   04/24/21 1000  ceFEPIme (MAXIPIME) 2 g in sodium chloride 0.9 % 100 mL IVPB        2 g 200 mL/hr over 30 Minutes Intravenous Every 8 hours 04/24/21 0925     04/23/21 1400  linezolid (ZYVOX) IVPB 600 mg       Note to Pharmacy: Pharm to adjust dose   600 mg 300 mL/hr over 60 Minutes Intravenous Every 12 hours 04/23/21 1123     04/23/21 1300  ceFEPIme (MAXIPIME) 2 g in sodium chloride 0.9 % 100 mL IVPB  Status:  Discontinued        2 g 200 mL/hr over 30 Minutes Intravenous Every 12 hours 04/23/21 1236 04/24/21 0925   04/23/21 0800  vancomycin (VANCOCIN) IVPB 1000 mg/200 mL premix  Status:  Discontinued        1,000 mg 200 mL/hr over 60 Minutes Intravenous Every 24 hours 04/22/21 0254 04/22/21 0306   04/22/21 0800  vancomycin (VANCOCIN) IVPB 1000 mg/200 mL premix  Status:  Discontinued        1,000  mg 200 mL/hr over 60 Minutes Intravenous Every 24 hours 04/22/21 0306 04/23/21 1123   04/22/21 0400  piperacillin-tazobactam (ZOSYN) IVPB 3.375 g  Status:  Discontinued        3.375 g 12.5 mL/hr over 240 Minutes Intravenous Every 8 hours 04/22/21 0254 04/23/21 1123   04/21/21 1900  piperacillin-tazobactam (ZOSYN) IVPB 3.375 g        3.375 g 100 mL/hr over 30 Minutes Intravenous  Once 04/21/21 1846 04/21/21 2010   04/21/21 1845  vancomycin (VANCOCIN) IVPB 1000 mg/200 mL premix        1,000 mg 200 mL/hr over 60 Minutes Intravenous  Once 04/21/21 1843 04/21/21 2124   04/21/21 1845  ceFEPIme (MAXIPIME) 2 g in sodium chloride 0.9 % 100 mL IVPB  Status:  Discontinued        2 g 200 mL/hr over 30 Minutes Intravenous  Once 04/21/21 1843 04/21/21 1846   04/21/21 1815  clindamycin (CLEOCIN) IVPB 600 mg        600 mg 100 mL/hr over 30 Minutes Intravenous  Once 04/21/21 1802 04/21/21 1908         Assessment/Plan POD# 2&5&6 for [place]; RIGHT THIGH 10/15 and 10/16 Dr. Ninfa Linden and 10/19 Dr. Donne Hazel for Necrotizing fascitis - WBC improving. Wound clean. Edema/induration of the right leg improving - keep penrose drain in place - BID wet to dry dressing changes (pack both openings each with a piece of kerlex), shower with wounds open - Cultures w/ actinomyces, staph hominis, Prevotella bivia and beta lactamase positive. Pharmacy is evaluating which abx to narrow to and oral options that will cover.  - He is going to have his mother and friend come in to learn how to do dressing changes at home  - Maximize blood sugar control to aid in healing process.  - Mobilize - Pulm toilet   ID - currently maxipime and linezolid FEN - CM diet VTE - SCDs, lovenox Foley - none   IDDM - will need medication assistance for insulin at discharge HTN - appreciate TRH assistance CKD2, AKI resolved BMI 35.30   LOS: 6 days    Bradley Davis , [facility] 04/28/2021, 9:13 AM Please see Amion for pager number during day hours 7:00am-4:30pm

## 2021-04-28 NOTE — Plan of Care (Signed)
  Problem: Activity: Goal: Risk for activity intolerance will decrease Outcome: Progressing   Problem: Pain Managment: Goal: General experience of comfort will improve Outcome: Progressing   Problem: Safety: Goal: Ability to remain free from injury will improve Outcome: Progressing   

## 2021-04-28 NOTE — Progress Notes (Signed)
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  Patient subsequently underwent initial I and D per general surgery, TRH consulted for management for acute kidney injury on chronic kidney disease, management of diabetes, hypertension.  Patient went back to the OR on 04/23/2021 and again on 04/26/2021 for further I and D  Assessment & Plan:   Principal Problem:   Necrotizing fasciitis (HCC) Active Problems:   Cellulitis and abscess of right leg   DM (diabetes mellitus), type 1 with renal complications (HCC)   HTN (hypertension)   Acute renal failure superimposed on stage 2 chronic kidney disease (HCC)   Hypokalemia   Abscess of groin    1 necrotizing fasciitis of the right groin/thigh -Patient admitted to the general surgical service underwent incision, drainage, debridement of right thigh/groin necrotizing fasciitis with cultures obtained currently pending. -Blood cultures thus far without growth x5 days -Currently afebrile, T-max of 37.8 C at 6:35 AM this morning -Leukocytosis had initially trended down, up to 18.6 today -Patient undergoing incision and drainage per general surgery, on 04/23/2021 and again this morning. -Had been on IV Vanco and IV Zosyn was earlier changed to IV cefepime and IV Zyvox due to acute on chronic renal failure.  -Have since narrowed coverage to augmentin x 14 days followed by amoxicillin x 90 days, on linezolid -Continue per primary team/general surgery.  2.  Acute renal failure on chronic kidney disease stage II -Initial baseline creatinine 1.3-1.5 per  primary nephrologist note from 06/07/2021. -Concern for possible progression of chronic kidney disease. -Acute renal failure on chronic kidney disease likely multifactorial secondary to acute infection with necrotizing fasciitis of the right groin/thigh, concern for possible rhabdomyolysis from acute infection/injury in the setting of dehydration and in the setting of ACE inhibitor. -Renal ultrasound with unremarkable sonographic appearance of bilateral kidneys. -Urinalysis with glycosuria, nitrite negative, leukocytes negative, proteinuria. -recent Urine sodium at 13, urine creatinine at 112.45. -CK level at 769.   -Received a 500 cc bolus of normal saline on 04/23/2021 and IV fluids increased to 150 cc an hour.   -Creatinine is stable at 1.5 today -Due to concern for volume overload and hypoalbuminemia, patient was given one dose of IV albumin followed by Lasix 20 mg x 1.  -Repeat basic metabolic panel in the morning  3.  Poorly controlled diabetes mellitus type 1 -Patient noted to be on insulin pump being followed by endocrinology in the outpatient setting. On further questioning, pt has not been using insulin pump as directed secondary to cost of supplies. He has instead been self-injecting short acting insulin as needed, which was intended to be used with pump -Hemoglobin A1c 14.4 (04/23/2021). -Pt is currently on Levemir 22 units twice daily with SSI coverage -Recommend prescribing current long acting and short acting coverage at time of d/c to cover pt until he can f/u with Endocrine as outpt   -Glycemic trends reviewed, stable and remains relatively well-controlled -Diabetes coordinator following. Appreciate input. Will f/u with pt if he prefers 70/30 regimen   4.  Hypertension -Norvasc maximized to 10 mg daily.   -hydralazine maximized to 100mg  TID -Have  resumed benazepril at lower dosage, plan to titrate as tolerated  -Recommend continuing with aggressive analgesia -Continue with  hydralazine as needed.   5.  Hypokalemia -Replaced -Continue to follow basic metabolic panel  6.  Hyponatremia -Likely secondary to volume depletion. -Improved with hydration.  7.  Lower extremity edema Patient with lower extremity edema right greater than left with some tightness in his thigh.   -Patient with albumin of 2.2.   -Patient was earlier given IV albumin 25 g x 1.  Lasix 20 mg IV x1.     DVT prophylaxis: Lovenox subq Code Status: Full Family Communication: Pt in room, family currently at bedside  Status is: Inpatient  Remains inpatient appropriate because: severity of illness  Consultants:  [facility] hospitalist: Dr. Ronaldo Miyamoto 04/22/2021   Procedures:  CT pelvis 04/21/2021 Chest x-ray 04/21/2021 Renal ultrasound 04/22/2021 Incision, drainage, debridement of right thigh/groin necrotizing fasciitis per Dr. Magnus Ivan, general surgery 04/22/2021 Irrigation and debridement right groin wound, right thigh and potential further debridement per Dr. Magnus Ivan 04/23/2021   Antimicrobials: Anti-infectives (From admission, onward)    Start     Dose/Rate Route Frequency Ordered Stop   05/12/21 2200  amoxicillin (AMOXIL) capsule 500 mg       See Hyperspace for full Linked Orders Report.   500 mg Oral Every 8 hours 04/28/21 1000 08/10/21 2159   04/28/21 2200  amoxicillin-clavulanate (AUGMENTIN) 875-125 MG per tablet 1 tablet       See Hyperspace for full Linked Orders Report.   1 tablet Oral Every 12 hours 04/28/21 1000 05/12/21 2159   04/28/21 1700  ceFEPIme (MAXIPIME) 2 g in sodium chloride 0.9 % 100 mL IVPB        2 g 200 mL/hr over 30 Minutes Intravenous  Once 04/28/21 1000     04/24/21 1000  ceFEPIme (MAXIPIME) 2 g in sodium chloride 0.9 % 100 mL IVPB  Status:  Discontinued        2 g 200 mL/hr over 30 Minutes Intravenous Every 8 hours 04/24/21 0925 04/28/21 1000   04/23/21 1400  linezolid (ZYVOX) IVPB 600 mg       Note to Pharmacy: Pharm to adjust dose   600 mg 300 mL/hr  over 60 Minutes Intravenous Every 12 hours 04/23/21 1123     04/23/21 1300  ceFEPIme (MAXIPIME) 2 g in sodium chloride 0.9 % 100 mL IVPB  Status:  Discontinued        2 g 200 mL/hr over 30 Minutes Intravenous Every 12 hours 04/23/21 1236 04/24/21 0925   04/23/21 0800  vancomycin (VANCOCIN) IVPB 1000 mg/200 mL premix  Status:  Discontinued        1,000 mg 200 mL/hr over 60 Minutes Intravenous Every 24 hours 04/22/21 0254 04/22/21 0306   04/22/21 0800  vancomycin (VANCOCIN) IVPB 1000 mg/200 mL premix  Status:  Discontinued        1,000 mg 200 mL/hr over 60 Minutes Intravenous Every 24 hours 04/22/21 0306 04/23/21 1123   04/22/21 0400  piperacillin-tazobactam (ZOSYN) IVPB 3.375 g  Status:  Discontinued        3.375 g 12.5 mL/hr over 240 Minutes Intravenous Every 8 hours 04/22/21 0254 04/23/21 1123   04/21/21 1900  piperacillin-tazobactam (ZOSYN) IVPB 3.375 g        3.375 g 100 mL/hr over 30 Minutes Intravenous  Once 04/21/21 1846 04/21/21 2010   04/21/21 1845  vancomycin (VANCOCIN) IVPB 1000 mg/200 mL premix        1,000 mg 200 mL/hr over  60 Minutes Intravenous  Once 04/21/21 1843 04/21/21 2124   04/21/21 1845  ceFEPIme (MAXIPIME) 2 g in sodium chloride 0.9 % 100 mL IVPB  Status:  Discontinued        2 g 200 mL/hr over 30 Minutes Intravenous  Once 04/21/21 1843 04/21/21 1846   04/21/21 1815  clindamycin (CLEOCIN) IVPB 600 mg        600 mg 100 mL/hr over 30 Minutes Intravenous  Once 04/21/21 1802 04/21/21 1908       Subjective: Without complaints   Objective: Vitals:   04/28/21 0309 04/28/21 0650 04/28/21 0953 04/28/21 1402  BP: (!) 175/100 (!) 183/91 (!) 187/86 (!) 173/87  Pulse: 98 97 100 (!) 101  Resp: 17 16 17 17   Temp: 98.5 F (36.9 C) 98.9 F (37.2 C) 98.4 F (36.9 C) 98 F (36.7 C)  TempSrc: Oral Oral Oral Oral  SpO2: 96% 92% 99% 98%  Weight:      Height:        Intake/Output Summary (Last 24 hours) at 04/28/2021 1627 Last data filed at 04/28/2021 1400 Gross per  24 hour  Intake 695.42 ml  Output 2300 ml  Net -1604.58 ml    Filed Weights   04/21/21 1504 04/26/21 0942  Weight: 111.6 kg 111.6 kg    Examination: General exam: Awake, laying in bed, in nad Respiratory system: Normal respiratory effort, no wheezing Cardiovascular system: regular rate, s1, s2 Gastrointestinal system: Soft, nondistended, positive BS Central nervous system: CN2-12 grossly intact, strength intact Extremities: Perfused, no clubbing Skin: Normal skin turgor, no notable skin lesions seen Psychiatry: Mood normal // no visual hallucinations   Data Reviewed: I have personally reviewed following labs and imaging studies  CBC: Recent Labs  Lab 04/24/21 0729 04/25/21 0325 04/26/21 0341 04/27/21 0335 04/28/21 0326  WBC 14.7* 13.2* 18.6* 19.3* 12.9*  NEUTROABS 11.6* 8.3* 12.5* 14.1* 8.2*  HGB 10.4* 10.1* 10.2* 9.8* 9.7*  HCT 31.5* 31.6* 31.4* 29.8* 30.3*  MCV 83.8 85.6 86.5 86.1 86.8  PLT 336 460* 464* 505* 507*    Basic Metabolic Panel: Recent Labs  Lab 04/23/21 0750 04/24/21 0729 04/25/21 0325 04/26/21 0341 04/27/21 0335 04/28/21 0326  NA  --  130* 133* 134* 135 133*  K  --  4.7 3.9 3.8 3.7 3.6  CL  --  104 106 107 107 103  CO2  --  19* 19* 18* 22 21*  GLUCOSE  --  212* 178* 137* 164* 123*  BUN  --  35* 27* 21* 18 14  CREATININE  --  1.94* 1.71* 1.64* 1.33* 1.54*  CALCIUM  --  7.9* 7.9* 8.0* 8.0* 8.2*  MG 2.0  --   --  1.8  --   --   PHOS  --  2.6 2.3* 3.0 3.6 3.5    GFR: Estimated Creatinine Clearance: 83.7 mL/min (A) (by C-G formula based on SCr of 1.54 mg/dL (H)). Liver Function Tests: Recent Labs  Lab 04/21/21 1748 04/24/21 0729 04/25/21 0325 04/26/21 0341 04/27/21 0335 04/28/21 0326  AST 44*  --   --   --   --   --   ALT 59*  --   --   --   --   --   ALKPHOS 126  --   --   --   --   --   BILITOT 0.7  --   --   --   --   --   PROT 6.3*  --   --   --   --   --  ALBUMIN 2.4* 1.8* 2.2* 2.2* 1.9* 2.2*    No results for input(s):  LIPASE, AMYLASE in the last 168 hours. No results for input(s): AMMONIA in the last 168 hours. Coagulation Profile: Recent Labs  Lab 04/21/21 1905  INR 1.3*    Cardiac Enzymes: Recent Labs  Lab 04/23/21 0840  CKTOTAL 769*    BNP (last 3 results) No results for input(s): PROBNP in the last 8760 hours. HbA1C: No results for input(s): HGBA1C in the last 72 hours. CBG: Recent Labs  Lab 04/27/21 1137 04/27/21 1559 04/27/21 2145 04/28/21 0747 04/28/21 1157  GLUCAP 140* 84 146* 93 167*    Lipid Profile: No results for input(s): CHOL, HDL, LDLCALC, TRIG, CHOLHDL, LDLDIRECT in the last 72 hours. Thyroid Function Tests: No results for input(s): TSH, T4TOTAL, FREET4, T3FREE, THYROIDAB in the last 72 hours. Anemia Panel: No results for input(s): VITAMINB12, FOLATE, FERRITIN, TIBC, IRON, RETICCTPCT in the last 72 hours. Sepsis Labs: Recent Labs  Lab 04/21/21 1748 04/21/21 2023  LATICACIDVEN 2.6* 2.3*     Recent Results (from the past 240 hour(s))  Blood culture (routine x 2)     Status: None   Collection Time: 04/21/21  5:45 PM   Specimen: BLOOD  Result Value Ref Range Status   Specimen Description   Final    BLOOD BLOOD LEFT FOREARM Performed at [facility], [address] [place], [place] [address]    Special Requests   Final    BOTTLES DRAWN AEROBIC AND ANAEROBIC Blood Culture adequate volume Performed at [facility], [address], [place], [place] [address]    Culture   Final    NO GROWTH 5 DAYS Performed at [facility], [address] [place], [place] [address]    Report Status 04/26/2021 FINAL  Final  Blood culture (routine x 2)     Status: None   Collection Time: 04/21/21  6:30 PM   Specimen: BLOOD  Result Value Ref Range Status   Specimen Description   Final    BLOOD RIGHT ANTECUBITAL Performed at [facility], [address] [place], [place] [address]    Special Requests   Final    BOTTLES DRAWN  AEROBIC AND ANAEROBIC Blood Culture results may not be optimal due to an excessive volume of blood received in culture bottles Performed at [facility], [address] [place], [place] [address]    Culture   Final    NO GROWTH 5 DAYS Performed at [facility], [address], [place], [place] [address]    Report Status 04/26/2021 FINAL  Final  Resp Panel by RT-PCR (Flu A&B, Covid) Nasopharyngeal Swab     Status: None   Collection Time: 04/21/21  6:45 PM   Specimen: Nasopharyngeal Swab; Nasopharyngeal(NP) swabs in vial transport medium  Result Value Ref Range Status   SARS Coronavirus 2 by RT PCR NEGATIVE NEGATIVE Final    Comment: (NOTE) SARS-CoV-2 target nucleic acids are NOT DETECTED.  The SARS-CoV-2 RNA is generally detectable in upper respiratory specimens during the acute phase of infection. The lowest concentration of SARS-CoV-2 viral copies this assay can detect is 138 copies/mL. A negative result does not preclude SARS-Cov-2 infection and should not be used as the sole basis for treatment or other patient management decisions. A negative result may occur with  improper specimen collection/handling, submission of specimen other than nasopharyngeal swab, presence of viral mutation(s) within the areas targeted  by this assay, and inadequate number of viral copies(<138 copies/mL). A negative result must be combined with clinical observations, patient history, and epidemiological information. The expected result is Negative.  Fact Sheet for Patients:  BloggerCourse.com  Fact Sheet for Healthcare Providers:  SeriousBroker.it  This test is no t yet approved or cleared by the [place] FDA and  has been authorized for detection and/or diagnosis of SARS-CoV-2 by FDA under an Emergency Use Authorization (EUA). This EUA will remain  in effect (meaning this test can be used) for the duration of the COVID-19  declaration under Section 564(b)(1) of the Act, 21 U.S.C.section 360bbb-3(b)(1), unless the authorization is terminated  or revoked sooner.       Influenza A by PCR NEGATIVE NEGATIVE Final   Influenza B by PCR NEGATIVE NEGATIVE Final    Comment: (NOTE) The Xpert Xpress SARS-CoV-2/FLU/RSV plus assay is intended as an aid in the diagnosis of influenza from Nasopharyngeal swab specimens and should not be used as a sole basis for treatment. Nasal washings and aspirates are unacceptable for Xpert Xpress SARS-CoV-2/FLU/RSV testing.  Fact Sheet for Patients: BloggerCourse.com  Fact Sheet for Healthcare Providers: SeriousBroker.it  This test is not yet approved or cleared by the [place] FDA and has been authorized for detection and/or diagnosis of SARS-CoV-2 by FDA under an Emergency Use Authorization (EUA). This EUA will remain in effect (meaning this test can be used) for the duration of the COVID-19 declaration under Section 564(b)(1) of the Act, 21 U.S.C. section 360bbb-3(b)(1), unless the authorization is terminated or revoked.  Performed at [facility], [address], [place], [place] [address]   Urine Culture     Status: None   Collection Time: 04/21/21 10:25 PM   Specimen: In/Out Cath Urine  Result Value Ref Range Status   Specimen Description   Final    IN/OUT CATH URINE Performed at [facility], [address] [place], [place] [address]    Special Requests   Final    NONE Performed at [facility], [address] [place], [place] [address]    Culture   Final    NO GROWTH Performed at [facility], [address], [place], [place] [address]    Report Status 04/23/2021 FINAL  Final  Aerobic/Anaerobic Culture w Gram Stain (surgical/deep wound)     Status: None   Collection Time: 04/22/21 12:29 AM   Specimen: Abscess  Result Value Ref Range Status   Specimen  Description   Final    ABSCESS Performed at [facility], [address] [place], [place] [address]    Special Requests   Final    NONE Performed at [facility], [address], [place], [place] [address]    Gram Stain   Final    FEW WBC PRESENT,BOTH PMN AND MONONUCLEAR ABUNDANT GRAM POSITIVE COCCI IN CLUSTERS IN TETRADS ABUNDANT GRAM NEGATIVE RODS    Culture   Final    RARE ACTINOMYCES SPECIES RARE STAPHYLOCOCCUS HOMINIS FEW PREVOTELLA BIVIA BETA LACTAMASE POSITIVE Standardized susceptibility testing for this organism is not available. FOR ACTINOMYCES SPECIES Performed at [facility], [address], [place], [place] [address]    Report Status 04/27/2021 FINAL  Final      Radiology Studies: No results found.  Scheduled Meds:  acetaminophen  1,000 mg Oral QID   amLODipine  10 mg Oral Daily   amoxicillin-clavulanate  1 tablet Oral Q12H  Followed by   Melene Muller ON 05/12/2021] amoxicillin  500 mg Oral Q8H   benazepril  10 mg Oral Daily   enoxaparin (LOVENOX) injection  40 mg Subcutaneous Q24H   feeding supplement  237 mL Oral BID BM   gabapentin  300 mg Oral BID   hydrALAZINE  100 mg Oral Q8H   insulin aspart  0-15 Units Subcutaneous TID WC   insulin detemir  22 Units Subcutaneous BID   methocarbamol  500 mg Oral QID   senna-docusate  1 tablet Oral BID   sucralfate  1 g Oral TID WC & HS   Continuous Infusions:  sodium chloride 10 mL/hr at 04/27/21 1705   ceFEPime (MAXIPIME) IV     linezolid (ZYVOX) IV 600 mg (04/28/21 1611)     LOS: 6 days   Rickey Barbara, MD [facility] Pager On Amion  If 7PM-7AM, please contact night-coverage 04/28/2021, 4:27 PM

## 2021-04-28 NOTE — Progress Notes (Signed)
Spoke with patient in regards to his diabetes and insulin. Noted that patient does not have health insurance and has had to stop using the Omnipod insulin pump due to cost. Has been taking Novolog 4-5 times per day per what his CGM and pump tell him to take. 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 Suggested to him that he should have a home blood glucose meter and strips at home as a backup.   Discussed with him about the fact that he needs insulin both basal and short acting if he is not able to use his insulin pump. Noted that social worker states that he can receive a MATCH letter for his insulin at discharge, but it will only for 30 days. He states that he will follow up with his endocrinologist, Dr. Allena Katz with [facility], if he can afford the cost of the visit. Spoke with him about the [facility] on [address] if he needs to see a physician that he can afford and can get insulin there, too. Case manager can help with this.   Discussed an alternative of using [facility] Relion 70/30 insulin pens which would cost about $42.88 for box of 5 pens. If patient were to decide to use the 70/30 insulin, a recommended dosage of 25 units BID (would be equal to 35 units basal and 15 units meal coverage) This would a little less than what he is taking in the hospital.  Other alternatives would be using Good Rx app that shows that Semglee insulin is $130 for pens and $88 for vial at [facility]. This is in comparison to Lantus at $425 for pens and $194 for vial at [facility].  Will continue to monitor blood sugars while in the hospital. Patient will need to find an affordable alternative to the insulin pump so that he can have access to insulin that he needs.   Smith Mince RN BSN CDE Diabetes Coordinator Pager: [phone]  8am-5pm

## 2021-04-29 LAB — CBC WITH DIFFERENTIAL/PLATELET
Abs Immature Granulocytes: 0.14 10*3/uL — ABNORMAL HIGH (ref 0.00–0.07)
Basophils Absolute: 0 10*3/uL (ref 0.0–0.1)
Basophils Relative: 0 %
Eosinophils Absolute: 0.2 10*3/uL (ref 0.0–0.5)
Eosinophils Relative: 2 %
HCT: 31.1 % — ABNORMAL LOW (ref 39.0–52.0)
Hemoglobin: 9.8 g/dL — ABNORMAL LOW (ref 13.0–17.0)
Immature Granulocytes: 1 %
Lymphocytes Relative: 24 %
Lymphs Abs: 2.6 10*3/uL (ref 0.7–4.0)
MCH: 27.5 pg (ref 26.0–34.0)
MCHC: 31.5 g/dL (ref 30.0–36.0)
MCV: 87.4 fL (ref 80.0–100.0)
Monocytes Absolute: 0.8 10*3/uL (ref 0.1–1.0)
Monocytes Relative: 8 %
Neutro Abs: 7.1 10*3/uL (ref 1.7–7.7)
Neutrophils Relative %: 65 %
Platelets: 551 10*3/uL — ABNORMAL HIGH (ref 150–400)
RBC: 3.56 MIL/uL — ABNORMAL LOW (ref 4.22–5.81)
RDW: 14.8 % (ref 11.5–15.5)
WBC: 11 10*3/uL — ABNORMAL HIGH (ref 4.0–10.5)
nRBC: 0 % (ref 0.0–0.2)

## 2021-04-29 LAB — RENAL FUNCTION PANEL
Albumin: 2.1 g/dL — ABNORMAL LOW (ref 3.5–5.0)
Anion gap: 6 (ref 5–15)
BUN: 12 mg/dL (ref 6–20)
CO2: 21 mmol/L — ABNORMAL LOW (ref 22–32)
Calcium: 7.9 mg/dL — ABNORMAL LOW (ref 8.9–10.3)
Chloride: 106 mmol/L (ref 98–111)
Creatinine, Ser: 1.6 mg/dL — ABNORMAL HIGH (ref 0.61–1.24)
GFR, Estimated: 57 mL/min — ABNORMAL LOW (ref >60)
Glucose, Bld: 257 mg/dL — ABNORMAL HIGH (ref 70–99)
Phosphorus: 3.5 mg/dL (ref 2.5–4.6)
Potassium: 3.7 mmol/L (ref 3.5–5.1)
Sodium: 133 mmol/L — ABNORMAL LOW (ref 135–145)

## 2021-04-29 LAB — GLUCOSE, CAPILLARY
Glucose-Capillary: 231 mg/dL — ABNORMAL HIGH (ref 70–99)
Glucose-Capillary: 178 mg/dL — ABNORMAL HIGH (ref 70–99)
Glucose-Capillary: 174 mg/dL — ABNORMAL HIGH (ref 70–99)

## 2021-04-29 MED ORDER — BENAZEPRIL HCL 10 MG PO TABS
20.0000 mg | ORAL_TABLET | Freq: Every day | ORAL | Status: DC
  Administered 2021-04-30 – 2021-05-01 (×2): 20 mg via ORAL
  Filled 2021-04-29 (×2): qty 2

## 2021-04-29 MED ORDER — BENAZEPRIL HCL 10 MG PO TABS
10.0000 mg | ORAL_TABLET | Freq: Once | ORAL | Status: AC
  Administered 2021-04-29: 10 mg via ORAL
  Filled 2021-04-29: qty 1

## 2021-04-29 MED ORDER — INSULIN ASPART PROT & ASPART (70-30 MIX) 100 UNIT/ML ~~LOC~~ SUSP
35.0000 [IU] | Freq: Two times a day (BID) | SUBCUTANEOUS | Status: DC
  Administered 2021-04-29: 35 [IU] via SUBCUTANEOUS
  Filled 2021-04-29: qty 10

## 2021-04-29 NOTE — Progress Notes (Addendum)
RN demonstrated dressing change to Pt's friend at bedside, she verbalized understanding, all questions answered.   Pt tolerated dressing change well, complained of some discomfort and pain 5/10.    16:00 Pt's mother given education on dressing change, states understanding.

## 2021-04-29 NOTE — Progress Notes (Addendum)
3 Days Post-Op   Subjective/Chief Complaint: Pt doing well this AM    Objective: Vital signs in last 24 hours: Temp:  [98 F (36.7 C)-99.7 F (37.6 C)] 99.7 F (37.6 C) (10/22 0621) Pulse Rate:  [99-102] 102 (10/22 0621) Resp:  [17-18] 18 (10/22 0621) BP: (169-187)/(85-89) 169/85 (10/22 0621) SpO2:  [96 %-99 %] 96 % (10/22 0621) Last BM Date: 04/27/21  Intake/Output from previous day: 10/21 0701 - 10/22 0700 In: 1654.5 [P.O.:840; I.V.:214.5; IV Piggyback:600] Out: 2750 [Urine:2750] Intake/Output this shift: No intake/output data recorded.  Ext: inc and woound c/d/I, min drainage, base clean  Lab Results:  Recent Labs    04/28/21 0326 04/29/21 0359  WBC 12.9* 11.0*  HGB 9.7* 9.8*  HCT 30.3* 31.1*  PLT 507* 551*   BMET Recent Labs    04/28/21 2211 04/29/21 0359  NA 133* 133*  K 3.9 3.7  CL 103 106  CO2 21* 21*  GLUCOSE 281* 257*  BUN 15 12  CREATININE 1.58* 1.60*  CALCIUM 8.4* 7.9*   Anti-infectives: Anti-infectives (From admission, onward)    Start     Dose/Rate Route Frequency Ordered Stop   05/12/21 2200  amoxicillin (AMOXIL) capsule 500 mg       See Hyperspace for full Linked Orders Report.   500 mg Oral Every 8 hours 04/28/21 1000 08/10/21 2159   04/28/21 2200  amoxicillin-clavulanate (AUGMENTIN) 875-125 MG per tablet 1 tablet       See Hyperspace for full Linked Orders Report.   1 tablet Oral Every 12 hours 04/28/21 1000 05/12/21 2159   04/28/21 1700  ceFEPIme (MAXIPIME) 2 g in sodium chloride 0.9 % 100 mL IVPB        2 g 200 mL/hr over 30 Minutes Intravenous  Once 04/28/21 1000 04/28/21 1835   04/24/21 1000  ceFEPIme (MAXIPIME) 2 g in sodium chloride 0.9 % 100 mL IVPB  Status:  Discontinued        2 g 200 mL/hr over 30 Minutes Intravenous Every 8 hours 04/24/21 0925 04/28/21 1000   04/23/21 1400  linezolid (ZYVOX) IVPB 600 mg       Note to Pharmacy: Pharm to adjust dose   600 mg 300 mL/hr over 60 Minutes Intravenous Every 12 hours 04/23/21  1123     04/23/21 1300  ceFEPIme (MAXIPIME) 2 g in sodium chloride 0.9 % 100 mL IVPB  Status:  Discontinued        2 g 200 mL/hr over 30 Minutes Intravenous Every 12 hours 04/23/21 1236 04/24/21 0925   04/23/21 0800  vancomycin (VANCOCIN) IVPB 1000 mg/200 mL premix  Status:  Discontinued        1,000 mg 200 mL/hr over 60 Minutes Intravenous Every 24 hours 04/22/21 0254 04/22/21 0306   04/22/21 0800  vancomycin (VANCOCIN) IVPB 1000 mg/200 mL premix  Status:  Discontinued        1,000 mg 200 mL/hr over 60 Minutes Intravenous Every 24 hours 04/22/21 0306 04/23/21 1123   04/22/21 0400  piperacillin-tazobactam (ZOSYN) IVPB 3.375 g  Status:  Discontinued        3.375 g 12.5 mL/hr over 240 Minutes Intravenous Every 8 hours 04/22/21 0254 04/23/21 1123   04/21/21 1900  piperacillin-tazobactam (ZOSYN) IVPB 3.375 g        3.375 g 100 mL/hr over 30 Minutes Intravenous  Once 04/21/21 1846 04/21/21 2010   04/21/21 1845  vancomycin (VANCOCIN) IVPB 1000 mg/200 mL premix        1,000 mg 200  mL/hr over 60 Minutes Intravenous  Once 04/21/21 1843 04/21/21 2124   04/21/21 1845  ceFEPIme (MAXIPIME) 2 g in sodium chloride 0.9 % 100 mL IVPB  Status:  Discontinued        2 g 200 mL/hr over 30 Minutes Intravenous  Once 04/21/21 1843 04/21/21 1846   04/21/21 1815  clindamycin (CLEOCIN) IVPB 600 mg        600 mg 100 mL/hr over 30 Minutes Intravenous  Once 04/21/21 1802 04/21/21 1908       Assessment/Plan: POD# 3&6&7 for IRRIGATION AND DEBRIDEMENT RIGHT GROIN WOUND; RIGHT THIGH 10/15 and 10/16 Dr. Magnus Ivan and 10/19 Dr. Dwain Sarna for Necrotizing fascitis - WBC improving. Wound clean. Edema/induration of the right leg improving - keep penrose drain in place - BID wet to dry dressing changes (pack both openings each with a piece of kerlex), shower with wounds open - dressing education - Mobilize - Pulm toilet   ID - currently maxipime and linezolid FEN - CM diet VTE - SCDs, lovenox Foley - none   IDDM -  will need medication assistance for insulin at discharge HTN - appreciate TRH assistance CKD2, AKI resolved BMI 35.30  Dispo: if dressing changes education occurs today, likely home tomorrow.   LOS: 7 days    Bradley Davis 04/29/2021

## 2021-04-29 NOTE — Progress Notes (Signed)
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  Patient subsequently underwent initial I and D per general surgery, TRH consulted for management for acute kidney injury on chronic kidney disease, management of diabetes, hypertension.  Patient went back to the OR on 04/23/2021 and again on 04/26/2021 for further I and D  Assessment & Plan:   Principal Problem:   Necrotizing fasciitis (HCC) Active Problems:   Cellulitis and abscess of right leg   DM (diabetes mellitus), type 1 with renal complications (HCC)   HTN (hypertension)   Acute renal failure superimposed on stage 2 chronic kidney disease (HCC)   Hypokalemia   Abscess of groin    1 necrotizing fasciitis of the right groin/thigh -Patient admitted to the general surgical service underwent incision, drainage, debridement of right thigh/groin necrotizing fasciitis with cultures obtained currently pending. -Blood cultures thus far without growth x5 days -Currently afebrile, T-max of 37.8 C at 6:35 AM this morning -Leukocytosis had initially trended down, up to 18.6 today -Patient undergoing incision and drainage per general surgery, on 04/23/2021 and again this morning. -Had been on IV Vanco and IV Zosyn was earlier changed to IV cefepime and IV Zyvox due to acute on chronic renal failure.  -Have since narrowed coverage to augmentin x 14 days followed by amoxicillin x 90 days, on linezolid -Continue per primary team/general surgery.  2.  Acute renal failure on chronic kidney disease stage II -Initial baseline creatinine 1.3-1.5 per  primary nephrologist note from 06/07/2021. -Concern for possible progression of chronic kidney disease. -Acute renal failure on chronic kidney disease likely multifactorial secondary to acute infection with necrotizing fasciitis of the right groin/thigh, concern for possible rhabdomyolysis from acute infection/injury in the setting of dehydration and in the setting of ACE inhibitor. -Renal ultrasound with unremarkable sonographic appearance of bilateral kidneys. -Urinalysis with glycosuria, nitrite negative, leukocytes negative, proteinuria. -recent Urine sodium at 13, urine creatinine at 112.45. -CK level at 769.   -Received a 500 cc bolus of normal saline on 04/23/2021 and IV fluids increased to 150 cc an hour.   -Creatinine is stable at 1.6 today -Resumed home acei  3.  Poorly controlled diabetes mellitus type 1 -Patient noted to be on insulin pump being followed by endocrinology in the outpatient setting. On further questioning, pt has not been using insulin pump as directed secondary to cost of supplies. He has instead been self-injecting short acting insulin as needed, which was intended to be used with pump -Hemoglobin A1c 14.4 (04/23/2021). -Pt is currently on Levemir 22 units twice daily with SSI coverage -Recommend prescribing current long acting and short acting coverage at time of d/c to cover pt until he can f/u with Endocrine as outpt   -Glycemic trends reviewed, stable and remains relatively well-controlled -Diabetes coordinator following. Appreciate input. Pt agreeable to temporarily transition to 70/30 25 units until he can f/u with endocrine   4.  Hypertension -Norvasc maximized to 10 mg daily.   -hydralazine maximized to 100mg  TID -Have resumed home benazepril   -Recommend continuing with aggressive analgesia -Continue with hydralazine as needed.   5.  Hypokalemia -Replaced -Continue  to follow basic metabolic panel  6.  Hyponatremia -Likely secondary to volume  depletion. -Improved with hydration.  7.  Lower extremity edema Patient with lower extremity edema right greater than left with some tightness in his thigh.   -Patient with albumin of 2.2.   -Patient was earlier given IV albumin 25 g x 1.  Lasix 20 mg IV x1.     DVT prophylaxis: Lovenox subq Code Status: Full Family Communication: Pt in room, family currently at bedside  Status is: Inpatient  Remains inpatient appropriate because: severity of illness  Consultants:  [facility] hospitalist: Dr. Ronaldo Miyamoto 04/22/2021   Procedures:  CT pelvis 04/21/2021 Chest x-ray 04/21/2021 Renal ultrasound 04/22/2021 Incision, drainage, debridement of right thigh/groin necrotizing fasciitis per Dr. Magnus Ivan, general surgery 04/22/2021 Irrigation and debridement right groin wound, right thigh and potential further debridement per Dr. Magnus Ivan 04/23/2021   Antimicrobials: Anti-infectives (From admission, onward)    Start     Dose/Rate Route Frequency Ordered Stop   05/12/21 2200  amoxicillin (AMOXIL) capsule 500 mg       See Hyperspace for full Linked Orders Report.   500 mg Oral Every 8 hours 04/28/21 1000 08/10/21 2159   04/28/21 2200  amoxicillin-clavulanate (AUGMENTIN) 875-125 MG per tablet 1 tablet       See Hyperspace for full Linked Orders Report.   1 tablet Oral Every 12 hours 04/28/21 1000 05/12/21 2159   04/28/21 1700  ceFEPIme (MAXIPIME) 2 g in sodium chloride 0.9 % 100 mL IVPB        2 g 200 mL/hr over 30 Minutes Intravenous  Once 04/28/21 1000 04/28/21 1835   04/24/21 1000  ceFEPIme (MAXIPIME) 2 g in sodium chloride 0.9 % 100 mL IVPB  Status:  Discontinued        2 g 200 mL/hr over 30 Minutes Intravenous Every 8 hours 04/24/21 0925 04/28/21 1000   04/23/21 1400  linezolid (ZYVOX) IVPB 600 mg  Status:  Discontinued       Note to Pharmacy: Pharm to adjust dose   600 mg 300 mL/hr over 60 Minutes Intravenous Every 12 hours 04/23/21 1123 04/29/21 1000   04/23/21 1300  ceFEPIme (MAXIPIME) 2 g  in sodium chloride 0.9 % 100 mL IVPB  Status:  Discontinued        2 g 200 mL/hr over 30 Minutes Intravenous Every 12 hours 04/23/21 1236 04/24/21 0925   04/23/21 0800  vancomycin (VANCOCIN) IVPB 1000 mg/200 mL premix  Status:  Discontinued        1,000 mg 200 mL/hr over 60 Minutes Intravenous Every 24 hours 04/22/21 0254 04/22/21 0306   04/22/21 0800  vancomycin (VANCOCIN) IVPB 1000 mg/200 mL premix  Status:  Discontinued        1,000 mg 200 mL/hr over 60 Minutes Intravenous Every 24 hours 04/22/21 0306 04/23/21 1123   04/22/21 0400  piperacillin-tazobactam (ZOSYN) IVPB 3.375 g  Status:  Discontinued        3.375 g 12.5 mL/hr over 240 Minutes Intravenous Every 8 hours 04/22/21 0254 04/23/21 1123   04/21/21 1900  piperacillin-tazobactam (ZOSYN) IVPB 3.375 g        3.375 g 100 mL/hr over 30 Minutes Intravenous  Once 04/21/21 1846 04/21/21 2010   04/21/21 1845  vancomycin (VANCOCIN) IVPB 1000 mg/200 mL premix        1,000 mg 200 mL/hr over 60 Minutes Intravenous  Once 04/21/21 1843 04/21/21 2124   04/21/21 1845  ceFEPIme (MAXIPIME) 2 g in sodium chloride 0.9 % 100  mL IVPB  Status:  Discontinued        2 g 200 mL/hr over 30 Minutes Intravenous  Once 04/21/21 1843 04/21/21 1846   04/21/21 1815  clindamycin (CLEOCIN) IVPB 600 mg        600 mg 100 mL/hr over 30 Minutes Intravenous  Once 04/21/21 1802 04/21/21 1908       Subjective: No complaints at this time  Objective: Vitals:   04/28/21 2100 04/29/21 0621 04/29/21 1025 04/29/21 1358  BP: (!) 186/89 (!) 169/85 (!) 178/86 (!) 171/85  Pulse: 99 (!) 102 (!) 106 (!) 103  Resp: 18 18 18 18   Temp: 98.6 F (37 C) 99.7 F (37.6 C) 99.1 F (37.3 C) 98.7 F (37.1 C)  TempSrc: Oral Oral Oral Oral  SpO2: 98% 96% 96% 97%  Weight:      Height:        Intake/Output Summary (Last 24 hours) at 04/29/2021 1651 Last data filed at 04/29/2021 1122 Gross per 24 hour  Intake 1153.25 ml  Output 1850 ml  Net -696.75 ml    Filed Weights    04/21/21 1504 04/26/21 0942  Weight: 111.6 kg 111.6 kg    Examination: General exam: Conversant, in no acute distress Respiratory system: normal chest rise, clear, no audible wheezing Cardiovascular system: regular rhythm, s1-s2 Gastrointestinal system: Nondistended, nontender, pos BS Central nervous system: No seizures, no tremors Extremities: No cyanosis, no joint deformities Skin: No rashes, no pallor Psychiatry: Affect normal // no auditory hallucinations   Data Reviewed: I have personally reviewed following labs and imaging studies  CBC: Recent Labs  Lab 04/25/21 0325 04/26/21 0341 04/27/21 0335 04/28/21 0326 04/29/21 0359  WBC 13.2* 18.6* 19.3* 12.9* 11.0*  NEUTROABS 8.3* 12.5* 14.1* 8.2* 7.1  HGB 10.1* 10.2* 9.8* 9.7* 9.8*  HCT 31.6* 31.4* 29.8* 30.3* 31.1*  MCV 85.6 86.5 86.1 86.8 87.4  PLT 460* 464* 505* 507* 551*    Basic Metabolic Panel: Recent Labs  Lab 04/23/21 0750 04/24/21 0729 04/25/21 0325 04/26/21 0341 04/27/21 0335 04/28/21 0326 04/28/21 2211 04/29/21 0359  NA  --    < > 133* 134* 135 133* 133* 133*  K  --    < > 3.9 3.8 3.7 3.6 3.9 3.7  CL  --    < > 106 107 107 103 103 106  CO2  --    < > 19* 18* 22 21* 21* 21*  GLUCOSE  --    < > 178* 137* 164* 123* 281* 257*  BUN  --    < > 27* 21* 18 14 15 12   CREATININE  --    < > 1.71* 1.64* 1.33* 1.54* 1.58* 1.60*  CALCIUM  --    < > 7.9* 8.0* 8.0* 8.2* 8.4* 7.9*  MG 2.0  --   --  1.8  --   --   --   --   PHOS  --    < > 2.3* 3.0 3.6 3.5  --  3.5   < > = values in this interval not displayed.    GFR: Estimated Creatinine Clearance: 80.6 mL/min (A) (by C-G formula based on SCr of 1.6 mg/dL (H)). Liver Function Tests: Recent Labs  Lab 04/25/21 0325 04/26/21 0341 04/27/21 0335 04/28/21 0326 04/29/21 0359  ALBUMIN 2.2* 2.2* 1.9* 2.2* 2.1*    No results for input(s): LIPASE, AMYLASE in the last 168 hours. No results for input(s): AMMONIA in the last 168 hours. Coagulation Profile: No  results for input(s): INR,  PROTIME in the last 168 hours.  Cardiac Enzymes: Recent Labs  Lab 04/23/21 0840  CKTOTAL 769*    BNP (last 3 results) No results for input(s): PROBNP in the last 8760 hours. HbA1C: No results for input(s): HGBA1C in the last 72 hours. CBG: Recent Labs  Lab 04/27/21 2145 04/28/21 0747 04/28/21 1157 04/29/21 0730 04/29/21 1126  GLUCAP 146* 93 167* 231* 178*    Lipid Profile: No results for input(s): CHOL, HDL, LDLCALC, TRIG, CHOLHDL, LDLDIRECT in the last 72 hours. Thyroid Function Tests: No results for input(s): TSH, T4TOTAL, FREET4, T3FREE, THYROIDAB in the last 72 hours. Anemia Panel: No results for input(s): VITAMINB12, FOLATE, FERRITIN, TIBC, IRON, RETICCTPCT in the last 72 hours. Sepsis Labs: No results for input(s): PROCALCITON, LATICACIDVEN in the last 168 hours.   Recent Results (from the past 240 hour(s))  Blood culture (routine x 2)     Status: None   Collection Time: 04/21/21  5:45 PM   Specimen: BLOOD  Result Value Ref Range Status   Specimen Description   Final    BLOOD BLOOD LEFT FOREARM Performed at [facility], [address] [place], [place] [address]    Special Requests   Final    BOTTLES DRAWN AEROBIC AND ANAEROBIC Blood Culture adequate volume Performed at [facility], [address] [place], [place] [address]    Culture   Final    NO GROWTH 5 DAYS Performed at [facility], [address] [place], [place] [address]    Report Status 04/26/2021 FINAL  Final  Blood culture (routine x 2)     Status: None   Collection Time: 04/21/21  6:30 PM   Specimen: BLOOD  Result Value Ref Range Status   Specimen Description   Final    BLOOD RIGHT ANTECUBITAL Performed at [facility], [address] [place], [place] [address]    Special Requests   Final    BOTTLES DRAWN AEROBIC AND ANAEROBIC Blood Culture results may not be optimal due to an excessive volume of blood received in  culture bottles Performed at [facility], [address] [place], [place] [address]    Culture   Final    NO GROWTH 5 DAYS Performed at [facility], [address], [place], [place] [address]    Report Status 04/26/2021 FINAL  Final  Resp Panel by RT-PCR (Flu A&B, Covid) Nasopharyngeal Swab     Status: None   Collection Time: 04/21/21  6:45 PM   Specimen: Nasopharyngeal Swab; Nasopharyngeal(NP) swabs in vial transport medium  Result Value Ref Range Status   SARS Coronavirus 2 by RT PCR NEGATIVE NEGATIVE Final    Comment: (NOTE) SARS-CoV-2 target nucleic acids are NOT DETECTED.  The SARS-CoV-2 RNA is generally detectable in upper respiratory specimens during the acute phase of infection. The lowest concentration of SARS-CoV-2 viral copies this assay can detect is 138 copies/mL. A negative result does not preclude SARS-Cov-2 infection and should not be used as the sole basis for treatment or other patient management decisions. A negative result may occur with  improper specimen collection/handling, submission of specimen other than nasopharyngeal swab, presence of viral mutation(s) within the areas targeted by this assay, and inadequate number of viral copies(<138 copies/mL). A negative result must be combined with clinical observations, patient history, and epidemiological information. The expected result is Negative.  Fact Sheet for Patients:  BloggerCourse.com  Fact Sheet for Healthcare Providers:  SeriousBroker.it  This test is no t yet approved or cleared by the [place] and  has been authorized for detection and/or diagnosis of SARS-CoV-2 by FDA under an Emergency Use Authorization (EUA). This EUA will remain  in effect (meaning this test can be used) for the duration of the COVID-19 declaration under Section 564(b)(1) of the Act, 21 U.S.C.section 360bbb-3(b)(1), unless the authorization is  terminated  or revoked sooner.       Influenza A by PCR NEGATIVE NEGATIVE Final   Influenza B by PCR NEGATIVE NEGATIVE Final    Comment: (NOTE) The Xpert Xpress SARS-CoV-2/FLU/RSV plus assay is intended as an aid in the diagnosis of influenza from Nasopharyngeal swab specimens and should not be used as a sole basis for treatment. Nasal washings and aspirates are unacceptable for Xpert Xpress SARS-CoV-2/FLU/RSV testing.  Fact Sheet for Patients: BloggerCourse.com  Fact Sheet for Healthcare Providers: SeriousBroker.it  This test is not yet approved or cleared by the [place] FDA and has been authorized for detection and/or diagnosis of SARS-CoV-2 by FDA under an Emergency Use Authorization (EUA). This EUA will remain in effect (meaning this test can be used) for the duration of the COVID-19 declaration under Section 564(b)(1) of the Act, 21 U.S.C. section 360bbb-3(b)(1), unless the authorization is terminated or revoked.  Performed at [facility], [address], [place], [place] [address]   Urine Culture     Status: None   Collection Time: 04/21/21 10:25 PM   Specimen: In/Out Cath Urine  Result Value Ref Range Status   Specimen Description   Final    IN/OUT CATH URINE Performed at [facility], [address] [place], [place] [address]    Special Requests   Final    NONE Performed at [facility], [address] [place], [place] [address]    Culture   Final    NO GROWTH Performed at [facility], [address] [place], [place] [address]    Report Status 04/23/2021 FINAL  Final  Aerobic/Anaerobic Culture w Gram Stain (surgical/deep wound)     Status: None   Collection Time: 04/22/21 12:29 AM   Specimen: Abscess  Result Value Ref Range Status   Specimen Description   Final    ABSCESS Performed at [facility], [address], [place], [place]  [address]    Special Requests   Final    NONE Performed at [facility], [address] [place], [place] [address]    Gram Stain   Final    FEW WBC PRESENT,BOTH PMN AND MONONUCLEAR ABUNDANT GRAM POSITIVE COCCI IN CLUSTERS IN TETRADS ABUNDANT GRAM NEGATIVE RODS    Culture   Final    RARE ACTINOMYCES SPECIES RARE STAPHYLOCOCCUS HOMINIS FEW PREVOTELLA BIVIA BETA LACTAMASE POSITIVE Standardized susceptibility testing for this organism is not available. FOR ACTINOMYCES SPECIES Performed at [facility], [address] [place], [place] [address]    Report Status 04/27/2021 FINAL  Final      Radiology Studies: No results found.  Scheduled Meds:  acetaminophen  1,000 mg Oral QID   amLODipine  10 mg Oral Daily   amoxicillin-clavulanate  1 tablet Oral Q12H   Followed by   Melene Muller ON 05/12/2021] amoxicillin  500 mg Oral Q8H   [START ON 04/30/2021] benazepril  20 mg Oral Daily   enoxaparin (LOVENOX) injection  40 mg Subcutaneous Q24H   feeding supplement  237 mL  Oral BID BM   gabapentin  300 mg Oral BID   hydrALAZINE  100 mg Oral Q8H   insulin aspart  0-15 Units Subcutaneous TID WC   insulin aspart protamine- aspart  35 Units Subcutaneous BID WC   methocarbamol  500 mg Oral QID   senna-docusate  1 tablet Oral BID   sucralfate  1 g Oral TID WC & HS   Continuous Infusions:  sodium chloride Stopped (04/29/21 0624)     LOS: 7 days   Rickey Barbara, MD [facility] Pager On Amion  If 7PM-7AM, please contact night-coverage 04/29/2021, 4:51 PM

## 2021-04-29 NOTE — Plan of Care (Signed)

## 2021-04-30 LAB — CBC WITH DIFFERENTIAL/PLATELET
Abs Immature Granulocytes: 0.09 10*3/uL — ABNORMAL HIGH (ref 0.00–0.07)
Basophils Absolute: 0 10*3/uL (ref 0.0–0.1)
Basophils Relative: 0 %
Eosinophils Absolute: 0.2 10*3/uL (ref 0.0–0.5)
Eosinophils Relative: 2 %
HCT: 32.6 % — ABNORMAL LOW (ref 39.0–52.0)
Hemoglobin: 10.3 g/dL — ABNORMAL LOW (ref 13.0–17.0)
Immature Granulocytes: 1 %
Lymphocytes Relative: 31 %
Lymphs Abs: 3.5 10*3/uL (ref 0.7–4.0)
MCH: 27.5 pg (ref 26.0–34.0)
MCHC: 31.6 g/dL (ref 30.0–36.0)
MCV: 86.9 fL (ref 80.0–100.0)
Monocytes Absolute: 0.8 10*3/uL (ref 0.1–1.0)
Monocytes Relative: 7 %
Neutro Abs: 6.9 10*3/uL (ref 1.7–7.7)
Neutrophils Relative %: 59 %
Platelets: 563 10*3/uL — ABNORMAL HIGH (ref 150–400)
RBC: 3.75 MIL/uL — ABNORMAL LOW (ref 4.22–5.81)
RDW: 14.7 % (ref 11.5–15.5)
WBC: 11.5 10*3/uL — ABNORMAL HIGH (ref 4.0–10.5)
nRBC: 0 % (ref 0.0–0.2)

## 2021-04-30 LAB — RENAL FUNCTION PANEL
Albumin: 2.3 g/dL — ABNORMAL LOW (ref 3.5–5.0)
Anion gap: 7 (ref 5–15)
BUN: 13 mg/dL (ref 6–20)
CO2: 23 mmol/L (ref 22–32)
Calcium: 8.3 mg/dL — ABNORMAL LOW (ref 8.9–10.3)
Chloride: 104 mmol/L (ref 98–111)
Creatinine, Ser: 1.51 mg/dL — ABNORMAL HIGH (ref 0.61–1.24)
GFR, Estimated: >60 mL/min
Glucose, Bld: 247 mg/dL — ABNORMAL HIGH (ref 70–99)
Phosphorus: 4.2 mg/dL (ref 2.5–4.6)
Potassium: 3.9 mmol/L (ref 3.5–5.1)
Sodium: 134 mmol/L — ABNORMAL LOW (ref 135–145)

## 2021-04-30 LAB — GLUCOSE, CAPILLARY
Glucose-Capillary: 239 mg/dL — ABNORMAL HIGH (ref 70–99)
Glucose-Capillary: 245 mg/dL — ABNORMAL HIGH (ref 70–99)
Glucose-Capillary: 82 mg/dL (ref 70–99)
Glucose-Capillary: 140 mg/dL — ABNORMAL HIGH (ref 70–99)
Glucose-Capillary: 149 mg/dL — ABNORMAL HIGH (ref 70–99)

## 2021-04-30 MED ORDER — INSULIN ASPART PROT & ASPART (70-30 MIX) 100 UNIT/ML ~~LOC~~ SUSP: 35.0000 [IU] | Freq: Two times a day (BID) | SUBCUTANEOUS | Status: DC

## 2021-04-30 MED ORDER — INSULIN ASPART PROT & ASPART (70-30 MIX) 100 UNIT/ML ~~LOC~~ SUSP
38.0000 [IU] | Freq: Two times a day (BID) | SUBCUTANEOUS | Status: DC
  Administered 2021-04-30: 38 [IU] via SUBCUTANEOUS
  Filled 2021-04-30: qty 10

## 2021-04-30 MED ORDER — INSULIN ASPART PROT & ASPART (70-30 MIX) 100 UNIT/ML ~~LOC~~ SUSP
25.0000 [IU] | Freq: Two times a day (BID) | SUBCUTANEOUS | Status: DC
  Administered 2021-04-30 – 2021-05-01 (×2): 25 [IU] via SUBCUTANEOUS

## 2021-04-30 NOTE — Progress Notes (Signed)
4 Days Post-Op   Subjective/Chief Complaint: Doing well, mom has had some training on wound   Objective: Vital signs in last 24 hours: Temp:  [98.3 F (36.8 C)-99.1 F (37.3 C)] 98.3 F (36.8 C) (10/23 0559) Pulse Rate:  [93-106] 95 (10/23 0559) Resp:  [18] 18 (10/23 0559) BP: (171-178)/(77-96) 174/96 (10/23 0559) SpO2:  [95 %-98 %] 98 % (10/23 0559) Last BM Date: 04/29/21  Intake/Output from previous day: 10/22 0701 - 10/23 0700 In: 1094.1 [P.O.:1090; I.V.:4.1] Out: 4930 [Urine:4930] Intake/Output this shift: No intake/output data recorded.  Cv regular Pulm effort clear Incision on thigh with penrose clean without infection, groin with some exudate but clean and minimal drainage, no infection  Lab Results:  Recent Labs    04/29/21 0359 04/30/21 0424  WBC 11.0* 11.5*  HGB 9.8* 10.3*  HCT 31.1* 32.6*  PLT 551* 563*   BMET Recent Labs    04/29/21 0359 04/30/21 0424  NA 133* 134*  K 3.7 3.9  CL 106 104  CO2 21* 23  GLUCOSE 257* 247*  BUN 12 13  CREATININE 1.60* 1.51*  CALCIUM 7.9* 8.3*   PT/INR No results for input(s): LABPROT, INR in the last 72 hours. ABG No results for input(s): PHART, HCO3 in the last 72 hours.  Invalid input(s): PCO2, PO2  Studies/Results: No results found.  Anti-infectives: Anti-infectives (From admission, onward)    Start     Dose/Rate Route Frequency Ordered Stop   05/12/21 2200  amoxicillin (AMOXIL) capsule 500 mg       See Hyperspace for full Linked Orders Report.   500 mg Oral Every 8 hours 04/28/21 1000 08/10/21 2159   04/28/21 2200  amoxicillin-clavulanate (AUGMENTIN) 875-125 MG per tablet 1 tablet       See Hyperspace for full Linked Orders Report.   1 tablet Oral Every 12 hours 04/28/21 1000 05/12/21 2159   04/28/21 1700  ceFEPIme (MAXIPIME) 2 g in sodium chloride 0.9 % 100 mL IVPB        2 g 200 mL/hr over 30 Minutes Intravenous  Once 04/28/21 1000 04/28/21 1835   04/24/21 1000  ceFEPIme (MAXIPIME) 2 g in sodium  chloride 0.9 % 100 mL IVPB  Status:  Discontinued        2 g 200 mL/hr over 30 Minutes Intravenous Every 8 hours 04/24/21 0925 04/28/21 1000   04/23/21 1400  linezolid (ZYVOX) IVPB 600 mg  Status:  Discontinued       Note to Pharmacy: Pharm to adjust dose   600 mg 300 mL/hr over 60 Minutes Intravenous Every 12 hours 04/23/21 1123 04/29/21 1000   04/23/21 1300  ceFEPIme (MAXIPIME) 2 g in sodium chloride 0.9 % 100 mL IVPB  Status:  Discontinued        2 g 200 mL/hr over 30 Minutes Intravenous Every 12 hours 04/23/21 1236 04/24/21 0925   04/23/21 0800  vancomycin (VANCOCIN) IVPB 1000 mg/200 mL premix  Status:  Discontinued        1,000 mg 200 mL/hr over 60 Minutes Intravenous Every 24 hours 04/22/21 0254 04/22/21 0306   04/22/21 0800  vancomycin (VANCOCIN) IVPB 1000 mg/200 mL premix  Status:  Discontinued        1,000 mg 200 mL/hr over 60 Minutes Intravenous Every 24 hours 04/22/21 0306 04/23/21 1123   04/22/21 0400  piperacillin-tazobactam (ZOSYN) IVPB 3.375 g  Status:  Discontinued        3.375 g 12.5 mL/hr over 240 Minutes Intravenous Every 8 hours 04/22/21 0254  04/23/21 1123   04/21/21 1900  piperacillin-tazobactam (ZOSYN) IVPB 3.375 g        3.375 g 100 mL/hr over 30 Minutes Intravenous  Once 04/21/21 1846 04/21/21 2010   04/21/21 1845  vancomycin (VANCOCIN) IVPB 1000 mg/200 mL premix        1,000 mg 200 mL/hr over 60 Minutes Intravenous  Once 04/21/21 1843 04/21/21 2124   04/21/21 1845  ceFEPIme (MAXIPIME) 2 g in sodium chloride 0.9 % 100 mL IVPB  Status:  Discontinued        2 g 200 mL/hr over 30 Minutes Intravenous  Once 04/21/21 1843 04/21/21 1846   04/21/21 1815  clindamycin (CLEOCIN) IVPB 600 mg        600 mg 100 mL/hr over 30 Minutes Intravenous  Once 04/21/21 1802 04/21/21 1908       Assessment/Plan: POD# 8/7/4 for IRRIGATION AND DEBRIDEMENT RIGHT GROIN WOUND; RIGHT THIGH 10/15 and 10/16 Dr. Magnus Ivan and 10/19 Dr. Dwain Sarna for Necrotizing fascitis - WBC improving.  Wound clean. Edema/induration of the right leg much improved - keep penrose drain in place - BID wet to dry dressing changes (pack both openings each with a piece of kerlex), shower with wounds open - dressing education - Mobilize - Pulm toilet   ID - cont abx FEN - CM diet VTE - SCDs, lovenox Foley - none   IDDM - will need medication assistance for insulin at discharge HTN - appreciate TRH assistance CKD2, AKI resolved BMI 35.30   Dispo needs more dressing change instruction so I think home tomorrow     Emelia Loron 04/30/2021

## 2021-04-30 NOTE — Progress Notes (Signed)
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  Patient subsequently underwent initial I and D per general surgery, TRH consulted for management for acute kidney injury on chronic kidney disease, management of diabetes, hypertension.  Patient went back to the OR on 04/23/2021 and again on 04/26/2021 for further I and D  Assessment & Plan:   Principal Problem:   Necrotizing fasciitis (HCC) Active Problems:   Cellulitis and abscess of right leg   DM (diabetes mellitus), type 1 with renal complications (HCC)   HTN (hypertension)   Acute renal failure superimposed on stage 2 chronic kidney disease (HCC)   Hypokalemia   Abscess of groin    1 necrotizing fasciitis of the right groin/thigh -Patient admitted to the general surgical service underwent incision, drainage, debridement of right thigh/groin necrotizing fasciitis with cultures obtained currently pending. -Blood cultures thus far without growth x5 days -Currently afebrile -Leukocytosis is down to 11.5k today -Patient undergoing incision and drainage per general surgery, on 04/23/2021 and again 10/19 -Had been on IV Vanco and IV Zosyn was earlier changed to IV cefepime and IV Zyvox due to acute on chronic renal failure.  -Have since narrowed coverage to augmentin x 14 days followed by amoxicillin x 90 days, on linezolid -Continue per primary team/general surgery.  2.  Acute renal failure on chronic kidney disease stage II -Initial baseline creatinine 1.3-1.5 per primary nephrologist note from 06/07/2021. -Concern for possible  progression of chronic kidney disease. -Acute renal failure on chronic kidney disease likely multifactorial secondary to acute infection with necrotizing fasciitis of the right groin/thigh, concern for possible rhabdomyolysis from acute infection/injury in the setting of dehydration and in the setting of ACE inhibitor. -Renal ultrasound with unremarkable sonographic appearance of bilateral kidneys. -Urinalysis with glycosuria, nitrite negative, leukocytes negative, proteinuria. -recent Urine sodium at 13, urine creatinine at 112.45. -CK level at 769.   -Received a 500 cc bolus of normal saline on 04/23/2021 -Creatinine now stable and improved at 1.51 -Resumed home dose acei  3.  Poorly controlled diabetes mellitus type 1 -Patient noted to be on insulin pump being followed by endocrinology in the outpatient setting. On further questioning, pt has not been using insulin pump as directed secondary to cost of supplies. He has instead been self-injecting short acting insulin as needed, which was intended to be used with pump -Hemoglobin A1c 14.4 (04/23/2021). -Pt is currently on Levemir 22 units twice daily with SSI coverage -Recommend prescribing current long acting and short acting coverage at time of d/c to cover pt until he can f/u with Endocrine as outpt   -Glycemic trends reviewed, stable and remains relatively well-controlled -Diabetes coordinator following. Appreciate input. Pt agreeable to temporarily transition to 70/30 25 units until he can f/u with endocrine, will prescribe at time of d/c   4.  Hypertension -Norvasc maximized to 10 mg daily.   -hydralazine maximized to  TID -Have resumed home benazepril   -Recommend continuing with aggressive analgesia -Continue with hydralazine as needed.   5.  Hypokalemia -Replaced -Continue to follow basic metabolic panel  6.  Hyponatremia -Likely secondary to volume depletion. -Improved with  hydration. -Repeat bmet in AM  7.  Lower  extremity edema Patient with lower extremity edema right greater than left with some tightness in his thigh.   -Patient with albumin of 2.2.   -Patient was earlier given IV albumin 25 g x 1.  Lasix 20 mg IV x1.     DVT prophylaxis: Lovenox subq Code Status: Full Family Communication: Pt in room, family currently not at bedside  Status is: Inpatient  Remains inpatient appropriate because: severity of illness  Consultants:  [facility] hospitalist: Dr. Ronaldo Miyamoto 04/22/2021   Procedures:  CT pelvis 04/21/2021 Chest x-ray 04/21/2021 Renal ultrasound 04/22/2021 Incision, drainage, debridement of right thigh/groin necrotizing fasciitis per Dr. Magnus Ivan, general surgery 04/22/2021 Irrigation and debridement right groin wound, right thigh and potential further debridement per Dr. Magnus Ivan 04/23/2021   Antimicrobials: Anti-infectives (From admission, onward)    Start     Dose/Rate Route Frequency Ordered Stop   05/12/21 2200  amoxicillin (AMOXIL) capsule 500 mg       See Hyperspace for full Linked Orders Report.   500 mg Oral Every 8 hours 04/28/21 1000 08/10/21 2159   04/28/21 2200  amoxicillin-clavulanate (AUGMENTIN) 875-125 MG per tablet 1 tablet       See Hyperspace for full Linked Orders Report.   1 tablet Oral Every 12 hours 04/28/21 1000 05/12/21 2159   04/28/21 1700  ceFEPIme (MAXIPIME) 2 g in sodium chloride 0.9 % 100 mL IVPB        2 g 200 mL/hr over 30 Minutes Intravenous  Once 04/28/21 1000 04/28/21 1835   04/24/21 1000  ceFEPIme (MAXIPIME) 2 g in sodium chloride 0.9 % 100 mL IVPB  Status:  Discontinued        2 g 200 mL/hr over 30 Minutes Intravenous Every 8 hours 04/24/21 0925 04/28/21 1000   04/23/21 1400  linezolid (ZYVOX) IVPB 600 mg  Status:  Discontinued       Note to Pharmacy: Pharm to adjust dose   600 mg 300 mL/hr over 60 Minutes Intravenous Every 12 hours 04/23/21 1123 04/29/21 1000   04/23/21 1300  ceFEPIme (MAXIPIME) 2 g in sodium chloride 0.9 % 100 mL IVPB  Status:   Discontinued        2 g 200 mL/hr over 30 Minutes Intravenous Every 12 hours 04/23/21 1236 04/24/21 0925   04/23/21 0800  vancomycin (VANCOCIN) IVPB 1000 mg/200 mL premix  Status:  Discontinued        1,000 mg 200 mL/hr over 60 Minutes Intravenous Every 24 hours 04/22/21 0254 04/22/21 0306   04/22/21 0800  vancomycin (VANCOCIN) IVPB 1000 mg/200 mL premix  Status:  Discontinued        1,000 mg 200 mL/hr over 60 Minutes Intravenous Every 24 hours 04/22/21 0306 04/23/21 1123   04/22/21 0400  piperacillin-tazobactam (ZOSYN) IVPB 3.375 g  Status:  Discontinued        3.375 g 12.5 mL/hr over 240 Minutes Intravenous Every 8 hours 04/22/21 0254 04/23/21 1123   04/21/21 1900  piperacillin-tazobactam (ZOSYN) IVPB 3.375 g        3.375 g 100 mL/hr over 30 Minutes Intravenous  Once 04/21/21 1846 04/21/21 2010   04/21/21 1845  vancomycin (VANCOCIN) IVPB 1000 mg/200 mL premix        1,000 mg 200 mL/hr over 60 Minutes Intravenous  Once 04/21/21 1843 04/21/21 2124   04/21/21 1845  ceFEPIme (MAXIPIME) 2 g in sodium chloride 0.9 % 100 mL IVPB  Status:  Discontinued  2 g 200 mL/hr over 30 Minutes Intravenous  Once 04/21/21 1843 04/21/21 1846   04/21/21 1815  clindamycin (CLEOCIN) IVPB 600 mg        600 mg 100 mL/hr over 30 Minutes Intravenous  Once 04/21/21 1802 04/21/21 1908       Subjective: Without complaints at this time. Reports wound pain seems somewhat better today  Objective: Vitals:   04/29/21 1358 04/29/21 2154 04/30/21 0559 04/30/21 1354  BP: (!) 171/85 (!) 177/77 (!) 174/96 (!) 163/75  Pulse: (!) 103 93 95 98  Resp: 18 18 18 18   Temp: 98.7 F (37.1 C) 98.6 F (37 C) 98.3 F (36.8 C) 99 F (37.2 C)  TempSrc: Oral Oral Oral Oral  SpO2: 97% 95% 98% 97%  Weight:      Height:        Intake/Output Summary (Last 24 hours) at 04/30/2021 1456 Last data filed at 04/30/2021 1400 Gross per 24 hour  Intake 1090 ml  Output 6155 ml  Net -5065 ml    Filed Weights   04/21/21  1504 04/26/21 0942  Weight: 111.6 kg 111.6 kg    Examination: General exam: Awake, laying in bed, in nad Respiratory system: Normal respiratory effort, no wheezing Cardiovascular system: regular rate, s1, s2 Gastrointestinal system: Soft, nondistended, positive BS Central nervous system: CN2-12 grossly intact, strength intact Extremities: Perfused, no clubbing Skin: Normal skin turgor, no rashes Psychiatry: Mood normal // no visual hallucinations   Data Reviewed: I have personally reviewed following labs and imaging studies  CBC: Recent Labs  Lab 04/26/21 0341 04/27/21 0335 04/28/21 0326 04/29/21 0359 04/30/21 0424  WBC 18.6* 19.3* 12.9* 11.0* 11.5*  NEUTROABS 12.5* 14.1* 8.2* 7.1 6.9  HGB 10.2* 9.8* 9.7* 9.8* 10.3*  HCT 31.4* 29.8* 30.3* 31.1* 32.6*  MCV 86.5 86.1 86.8 87.4 86.9  PLT 464* 505* 507* 551* 563*    Basic Metabolic Panel: Recent Labs  Lab 04/26/21 0341 04/27/21 0335 04/28/21 0326 04/28/21 2211 04/29/21 0359 04/30/21 0424  NA 134* 135 133* 133* 133* 134*  K 3.8 3.7 3.6 3.9 3.7 3.9  CL 107 107 103 103 106 104  CO2 18* 22 21* 21* 21* 23  GLUCOSE 137* 164* 123* 281* 257* 247*  BUN 21* 18 14 15 12 13   CREATININE 1.64* 1.33* 1.54* 1.58* 1.60* 1.51*  CALCIUM 8.0* 8.0* 8.2* 8.4* 7.9* 8.3*  MG 1.8  --   --   --   --   --   PHOS 3.0 3.6 3.5  --  3.5 4.2    GFR: Estimated Creatinine Clearance: 85.4 mL/min (A) (by C-G formula based on SCr of 1.51 mg/dL (H)). Liver Function Tests: Recent Labs  Lab 04/26/21 0341 04/27/21 0335 04/28/21 0326 04/29/21 0359 04/30/21 0424  ALBUMIN 2.2* 1.9* 2.2* 2.1* 2.3*    No results for input(s): LIPASE, AMYLASE in the last 168 hours. No results for input(s): AMMONIA in the last 168 hours. Coagulation Profile: No results for input(s): INR, PROTIME in the last 168 hours.  Cardiac Enzymes: No results for input(s): CKTOTAL, CKMB, CKMBINDEX, TROPONINI in the last 168 hours.  BNP (last 3 results) No results for  input(s): PROBNP in the last 8760 hours. HbA1C: No results for input(s): HGBA1C in the last 72 hours. CBG: Recent Labs  Lab 04/29/21 1126 04/29/21 1654 04/30/21 0316 04/30/21 0753 04/30/21 1140  GLUCAP 178* 174* 239* 245* 82    Lipid Profile: No results for input(s): CHOL, HDL, LDLCALC, TRIG, CHOLHDL, LDLDIRECT in the last 72 hours.  Thyroid Function Tests: No results for input(s): TSH, T4TOTAL, FREET4, T3FREE, THYROIDAB in the last 72 hours. Anemia Panel: No results for input(s): VITAMINB12, FOLATE, FERRITIN, TIBC, IRON, RETICCTPCT in the last 72 hours. Sepsis Labs: No results for input(s): PROCALCITON, LATICACIDVEN in the last 168 hours.   Recent Results (from the past 240 hour(s))  Blood culture (routine x 2)     Status: None   Collection Time: 04/21/21  5:45 PM   Specimen: BLOOD  Result Value Ref Range Status   Specimen Description   Final    BLOOD BLOOD LEFT FOREARM Performed at [facility], [address] [place], [place] [address]    Special Requests   Final    BOTTLES DRAWN AEROBIC AND ANAEROBIC Blood Culture adequate volume Performed at [facility], [address] [place], [place] [address]    Culture   Final    NO GROWTH 5 DAYS Performed at [facility], [address], [place], [place] [address]    Report Status 04/26/2021 FINAL  Final  Blood culture (routine x 2)     Status: None   Collection Time: 04/21/21  6:30 PM   Specimen: BLOOD  Result Value Ref Range Status   Specimen Description   Final    BLOOD RIGHT ANTECUBITAL Performed at [facility], [address] [place], [place] [address]    Special Requests   Final    BOTTLES DRAWN AEROBIC AND ANAEROBIC Blood Culture results may not be optimal due to an excessive volume of blood received in culture bottles Performed at [facility], [address] [place], [place] [address]    Culture   Final    NO GROWTH 5 DAYS Performed at [facility], [address], [place], [place] [address]    Report Status 04/26/2021 FINAL  Final  Resp Panel by RT-PCR (Flu A&B, Covid) Nasopharyngeal Swab     Status: None   Collection Time: 04/21/21  6:45 PM   Specimen: Nasopharyngeal Swab; Nasopharyngeal(NP) swabs in vial transport medium  Result Value Ref Range Status   SARS Coronavirus 2 by RT PCR NEGATIVE NEGATIVE Final    Comment: (NOTE) SARS-CoV-2 target nucleic acids are NOT DETECTED.  The SARS-CoV-2 RNA is generally detectable in upper respiratory specimens during the acute phase of infection. The lowest concentration of SARS-CoV-2 viral copies this assay can detect is 138 copies/mL. A negative result does not preclude SARS-Cov-2 infection and should not be used as the sole basis for treatment or other patient management decisions. A negative result may occur with  improper specimen collection/handling, submission of specimen other than nasopharyngeal swab, presence of viral mutation(s) within the areas targeted by this assay, and inadequate number of viral copies(<138 copies/mL). A negative result must be combined with clinical observations, patient history, and epidemiological information. The expected result is Negative.  Fact Sheet for Patients:  BloggerCourse.com  Fact Sheet for Healthcare Providers:  SeriousBroker.it  This test is no t yet approved or cleared by the [place] FDA and  has been authorized for detection and/or diagnosis of SARS-CoV-2 by FDA under an Emergency Use Authorization (EUA). This EUA will remain  in effect (meaning this test can be used) for the duration of the COVID-19 declaration under Section 564(b)(1) of the Act, 21 U.S.C.section 360bbb-3(b)(1), unless the authorization is terminated  or revoked sooner.       Influenza A by PCR NEGATIVE NEGATIVE Final  Influenza B by PCR NEGATIVE NEGATIVE Final    Comment: (NOTE) The Xpert Xpress  SARS-CoV-2/FLU/RSV plus assay is intended as an aid in the diagnosis of influenza from Nasopharyngeal swab specimens and should not be used as a sole basis for treatment. Nasal washings and aspirates are unacceptable for Xpert Xpress SARS-CoV-2/FLU/RSV testing.  Fact Sheet for Patients: BloggerCourse.com  Fact Sheet for Healthcare Providers: SeriousBroker.it  This test is not yet approved or cleared by the [place] FDA and has been authorized for detection and/or diagnosis of SARS-CoV-2 by FDA under an Emergency Use Authorization (EUA). This EUA will remain in effect (meaning this test can be used) for the duration of the COVID-19 declaration under Section 564(b)(1) of the Act, 21 U.S.C. section 360bbb-3(b)(1), unless the authorization is terminated or revoked.  Performed at [facility], [address] [place], [place] [address]   Urine Culture     Status: None   Collection Time: 04/21/21 10:25 PM   Specimen: In/Out Cath Urine  Result Value Ref Range Status   Specimen Description   Final    IN/OUT CATH URINE Performed at [facility], [address] [place], [place] [address]    Special Requests   Final    NONE Performed at [facility], [address] [place], [place] [address]    Culture   Final    NO GROWTH Performed at [facility], [address], [place], [place] [address]    Report Status 04/23/2021 FINAL  Final  Aerobic/Anaerobic Culture w Gram Stain (surgical/deep wound)     Status: None   Collection Time: 04/22/21 12:29 AM   Specimen: Abscess  Result Value Ref Range Status   Specimen Description   Final    ABSCESS Performed at [facility], [address] [place], [place] [address]    Special Requests   Final    NONE Performed at [facility], [address], [place], [place] [address]    Gram Stain   Final    FEW WBC  PRESENT,BOTH PMN AND MONONUCLEAR ABUNDANT GRAM POSITIVE COCCI IN CLUSTERS IN TETRADS ABUNDANT GRAM NEGATIVE RODS    Culture   Final    RARE ACTINOMYCES SPECIES RARE STAPHYLOCOCCUS HOMINIS FEW PREVOTELLA BIVIA BETA LACTAMASE POSITIVE Standardized susceptibility testing for this organism is not available. FOR ACTINOMYCES SPECIES Performed at [facility], [address], [place], [place] [address]    Report Status 04/27/2021 FINAL  Final      Radiology Studies: No results found.  Scheduled Meds:  acetaminophen  1,000 mg Oral QID   amLODipine  10 mg Oral Daily   amoxicillin-clavulanate  1 tablet Oral Q12H   Followed by   Melene Muller ON 05/12/2021] amoxicillin  500 mg Oral Q8H   benazepril  20 mg Oral Daily   enoxaparin (LOVENOX) injection  40 mg Subcutaneous Q24H   feeding supplement  237 mL Oral BID BM   gabapentin  300 mg Oral BID   hydrALAZINE  100 mg Oral Q8H   insulin aspart  0-15 Units Subcutaneous TID WC   insulin aspart protamine- aspart  38 Units Subcutaneous BID WC   methocarbamol  500 mg Oral QID   senna-docusate  1 tablet Oral BID   sucralfate  1 g Oral TID WC & HS   Continuous Infusions:  sodium chloride Stopped (04/29/21 0624)     LOS: 8 days   Rickey Barbara, MD [facility] Pager On Amion  If 7PM-7AM, please contact night-coverage 04/30/2021, 2:56 PM

## 2021-05-01 LAB — RENAL FUNCTION PANEL
Albumin: 2.1 g/dL — ABNORMAL LOW (ref 3.5–5.0)
Anion gap: 7 (ref 5–15)
BUN: 13 mg/dL (ref 6–20)
CO2: 24 mmol/L (ref 22–32)
Calcium: 8.2 mg/dL — ABNORMAL LOW (ref 8.9–10.3)
Chloride: 104 mmol/L (ref 98–111)
Creatinine, Ser: 1.48 mg/dL — ABNORMAL HIGH (ref 0.61–1.24)
GFR, Estimated: >60 mL/min (ref >60)
Glucose, Bld: 203 mg/dL — ABNORMAL HIGH (ref 70–99)
Phosphorus: 3.9 mg/dL (ref 2.5–4.6)
Potassium: 3.8 mmol/L (ref 3.5–5.1)
Sodium: 135 mmol/L (ref 135–145)

## 2021-05-01 LAB — CBC WITH DIFFERENTIAL/PLATELET
Abs Immature Granulocytes: 0.06 10*3/uL (ref 0.00–0.07)
Basophils Absolute: 0 10*3/uL (ref 0.0–0.1)
Basophils Relative: 0 %
Eosinophils Absolute: 0.2 10*3/uL (ref 0.0–0.5)
Eosinophils Relative: 2 %
HCT: 30.2 % — ABNORMAL LOW (ref 39.0–52.0)
Hemoglobin: 9.7 g/dL — ABNORMAL LOW (ref 13.0–17.0)
Immature Granulocytes: 1 %
Lymphocytes Relative: 33 %
Lymphs Abs: 3.1 10*3/uL (ref 0.7–4.0)
MCH: 28.1 pg (ref 26.0–34.0)
MCHC: 32.1 g/dL (ref 30.0–36.0)
MCV: 87.5 fL (ref 80.0–100.0)
Monocytes Absolute: 0.7 10*3/uL (ref 0.1–1.0)
Monocytes Relative: 8 %
Neutro Abs: 5.3 10*3/uL (ref 1.7–7.7)
Neutrophils Relative %: 56 %
Platelets: 511 10*3/uL — ABNORMAL HIGH (ref 150–400)
RBC: 3.45 MIL/uL — ABNORMAL LOW (ref 4.22–5.81)
RDW: 14.6 % (ref 11.5–15.5)
WBC: 9.3 10*3/uL (ref 4.0–10.5)
nRBC: 0 % (ref 0.0–0.2)

## 2021-05-01 LAB — GLUCOSE, CAPILLARY: Glucose-Capillary: 258 mg/dL — ABNORMAL HIGH (ref 70–99)

## 2021-05-01 MED ORDER — NOVOLOG 70/30 FLEXPEN RELION (70-30) 100 UNIT/ML ~~LOC~~ SUPN: 30.0000 [IU] | PEN_INJECTOR | Freq: Two times a day (BID) | SUBCUTANEOUS | 1 refills | Status: DC

## 2021-05-01 MED ORDER — METHOCARBAMOL 500 MG PO TABS: 500.0000 mg | ORAL_TABLET | Freq: Four times a day (QID) | ORAL | 0 refills | Status: AC | PRN

## 2021-05-01 MED ORDER — HYDRALAZINE HCL 100 MG PO TABS: 100.0000 mg | ORAL_TABLET | Freq: Three times a day (TID) | ORAL | 0 refills | Status: DC

## 2021-05-01 MED ORDER — ATORVASTATIN CALCIUM 80 MG PO TABS: 80.0000 mg | ORAL_TABLET | Freq: Every day | ORAL | Status: DC

## 2021-05-01 MED ORDER — INSULIN PEN NEEDLE 31G X 5 MM MISC: 1.0000 | Freq: Two times a day (BID) | 0 refills | Status: DC

## 2021-05-01 MED ORDER — AMOXICILLIN 500 MG PO CAPS: 500.0000 mg | ORAL_CAPSULE | Freq: Three times a day (TID) | ORAL | 0 refills | Status: AC

## 2021-05-01 MED ORDER — AMOXICILLIN 500 MG PO CAPS: 500.0000 mg | ORAL_CAPSULE | Freq: Three times a day (TID) | ORAL | 0 refills | Status: DC

## 2021-05-01 MED ORDER — AMLODIPINE BESYLATE 10 MG PO TABS: 10.0000 mg | ORAL_TABLET | Freq: Every day | ORAL | 0 refills | Status: DC

## 2021-05-01 MED ORDER — OXYCODONE HCL 5 MG PO TABS: 10.0000 mg | ORAL_TABLET | Freq: Four times a day (QID) | ORAL | 0 refills | Status: DC | PRN

## 2021-05-01 MED ORDER — AMOXICILLIN-POT CLAVULANATE 875-125 MG PO TABS: 1.0000 | ORAL_TABLET | Freq: Two times a day (BID) | ORAL | 0 refills | Status: DC

## 2021-05-01 MED ORDER — METHOCARBAMOL 500 MG PO TABS: 500.0000 mg | ORAL_TABLET | Freq: Four times a day (QID) | ORAL | 0 refills | Status: DC | PRN

## 2021-05-01 MED ORDER — OXYCODONE HCL 5 MG PO TABS: 10.0000 mg | ORAL_TABLET | Freq: Four times a day (QID) | ORAL | 0 refills | Status: AC | PRN

## 2021-05-01 MED ORDER — PROBIOTIC 250 MG PO CAPS: 250.0000 mg | ORAL_CAPSULE | Freq: Every day | ORAL | Status: DC

## 2021-05-01 MED ORDER — AMOXICILLIN-POT CLAVULANATE 875-125 MG PO TABS: 1.0000 | ORAL_TABLET | Freq: Two times a day (BID) | ORAL | 0 refills | Status: AC

## 2021-05-01 NOTE — TOC Transition Note (Signed)
Transition of Care [facility]) - CM/SW Discharge Note   Patient Details  Name: Bradley Davis MRN: [id number] Date of Birth: December 14, 1985  Transition of Care [facility]) CM/SW Contact:  Lennart Pall, LCSW Phone Number: 05/01/2021, 11:42 AM   Clinical Narrative:    Met with pt to review dc needs including PCP and medication assistance.  Pt has been entered into the [facility] program and will need to have scripts sent to [facility] on [address]  Richard Miu, [place] aware).  Have been able to secure an appointment for PCP at [place] on 06/06/21 @ 1:30pm (of note, this was earliest appt available at all of the [facility]).  Pt has previously been provided information on the [facility] program as well.  Pt understanding all arrangements made.  Ready for dc today.  No further TOC needs.   Final next level of care: Home/Self Care Barriers to Discharge: Barriers Resolved   Patient Goals and CMS Choice     Choice offered to / list presented to : NA  Discharge Placement                       Discharge Plan and Services In-house Referral: Clinical Social Work Discharge Planning Services: [facility] Program Post Acute Care Choice: NA          DME Arranged: N/A DME Agency: NA                  Social Determinants of Health (SDOH) Interventions     Readmission Risk Interventions No flowsheet data found.

## 2021-05-01 NOTE — Progress Notes (Signed)
5 Days Post-Op   Subjective/Chief Complaint: No acute changes. Mom on phone during visit and received more wound care training yesterday. Tolerating diet. Pain well controlled   Objective: Vital signs in last 24 hours: Temp:  [98.9 F (37.2 C)-99 F (37.2 C)] 98.9 F (37.2 C) (10/24 0537) Pulse Rate:  [97-98] 97 (10/24 0537) Resp:  [18] 18 (10/24 0537) BP: (155-163)/(75-77) 155/77 (10/24 0537) SpO2:  [95 %-97 %] 95 % (10/24 0537) Last BM Date: 04/30/21  Intake/Output from previous day: 10/23 0701 - 10/24 0700 In: 1740 [P.O.:1740] Out: 4625 [Urine:4625] Intake/Output this shift: No intake/output data recorded.  Gen: alert, NAD Cv: regular Pulm: effort nonlabored on room air Skin: Incision on thigh with penrose clean without infection, groin with some exudate but clean and minimal drainage, no infection. No cellulitis Psych: A&O x3 with appropriate affect     Lab Results:  Recent Labs    04/30/21 0424 05/01/21 0409  WBC 11.5* 9.3  HGB 10.3* 9.7*  HCT 32.6* 30.2*  PLT 563* 511*    BMET Recent Labs    04/30/21 0424 05/01/21 0409  NA 134* 135  K 3.9 3.8  CL 104 104  CO2 23 24  GLUCOSE 247* 203*  BUN 13 13  CREATININE 1.51* 1.48*  CALCIUM 8.3* 8.2*    PT/INR No results for input(s): LABPROT, INR in the last 72 hours. ABG No results for input(s): PHART, HCO3 in the last 72 hours.  Invalid input(s): PCO2, PO2  Studies/Results: No results found.  Anti-infectives: Anti-infectives (From admission, onward)    Start     Dose/Rate Route Frequency Ordered Stop   05/13/21 0000  amoxicillin (AMOXIL) 500 MG capsule        500 mg Oral Every 8 hours 05/01/21 0910 08/11/21 2359   05/12/21 2200  amoxicillin (AMOXIL) capsule 500 mg       See Hyperspace for full Linked Orders Report.   500 mg Oral Every 8 hours 04/28/21 1000 08/10/21 2159   05/01/21 0000  amoxicillin-clavulanate (AUGMENTIN) 875-125 MG tablet        1 tablet Oral Every 12 hours 05/01/21 0910  05/13/21 2359   04/28/21 2200  amoxicillin-clavulanate (AUGMENTIN) 875-125 MG per tablet 1 tablet       See Hyperspace for full Linked Orders Report.   1 tablet Oral Every 12 hours 04/28/21 1000 05/12/21 2159   04/28/21 1700  ceFEPIme (MAXIPIME) 2 g in sodium chloride 0.9 % 100 mL IVPB        2 g 200 mL/hr over 30 Minutes Intravenous  Once 04/28/21 1000 04/28/21 1835   04/24/21 1000  ceFEPIme (MAXIPIME) 2 g in sodium chloride 0.9 % 100 mL IVPB  Status:  Discontinued        2 g 200 mL/hr over 30 Minutes Intravenous Every 8 hours 04/24/21 0925 04/28/21 1000   04/23/21 1400  linezolid (ZYVOX) IVPB 600 mg  Status:  Discontinued       Note to Pharmacy: Pharm to adjust dose   600 mg 300 mL/hr over 60 Minutes Intravenous Every 12 hours 04/23/21 1123 04/29/21 1000   04/23/21 1300  ceFEPIme (MAXIPIME) 2 g in sodium chloride 0.9 % 100 mL IVPB  Status:  Discontinued        2 g 200 mL/hr over 30 Minutes Intravenous Every 12 hours 04/23/21 1236 04/24/21 0925   04/23/21 0800  vancomycin (VANCOCIN) IVPB 1000 mg/200 mL premix  Status:  Discontinued        1,000 mg 200  mL/hr over 60 Minutes Intravenous Every 24 hours 04/22/21 0254 04/22/21 0306   04/22/21 0800  vancomycin (VANCOCIN) IVPB 1000 mg/200 mL premix  Status:  Discontinued        1,000 mg 200 mL/hr over 60 Minutes Intravenous Every 24 hours 04/22/21 0306 04/23/21 1123   04/22/21 0400  piperacillin-tazobactam (ZOSYN) IVPB 3.375 g  Status:  Discontinued        3.375 g 12.5 mL/hr over 240 Minutes Intravenous Every 8 hours 04/22/21 0254 04/23/21 1123   04/21/21 1900  piperacillin-tazobactam (ZOSYN) IVPB 3.375 g        3.375 g 100 mL/hr over 30 Minutes Intravenous  Once 04/21/21 1846 04/21/21 2010   04/21/21 1845  vancomycin (VANCOCIN) IVPB 1000 mg/200 mL premix        1,000 mg 200 mL/hr over 60 Minutes Intravenous  Once 04/21/21 1843 04/21/21 2124   04/21/21 1845  ceFEPIme (MAXIPIME) 2 g in sodium chloride 0.9 % 100 mL IVPB  Status:  Discontinued         2 g 200 mL/hr over 30 Minutes Intravenous  Once 04/21/21 1843 04/21/21 1846   04/21/21 1815  clindamycin (CLEOCIN) IVPB 600 mg        600 mg 100 mL/hr over 30 Minutes Intravenous  Once 04/21/21 1802 04/21/21 1908       Assessment/Plan: POD# 9/8/5 for IRRIGATION AND DEBRIDEMENT RIGHT GROIN WOUND; RIGHT THIGH 10/15 and 10/16 Dr. Magnus Ivan and 10/19 Dr. Dwain Sarna for Necrotizing fascitis - WBC improved/leukocytosis resolved. Wound clean. Edema/induration of the right leg much improved - keep penrose drain in place - BID wet to dry dressing changes (pack both openings each with a piece of kerlex), shower with wounds open - dressing education - Mobilize - Pulm toilet   ID - cont abx FEN - CM diet VTE - SCDs, lovenox Foley - none   IDDM - will need medication assistance for insulin at discharge HTN - appreciate TRH assistance CKD2, AKI resolved BMI 35.30    Home today    Eric Form 05/01/2021

## 2021-05-01 NOTE — Progress Notes (Signed)
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  Patient subsequently underwent initial I and D per general surgery, TRH consulted for management for acute kidney injury on chronic kidney disease, management of diabetes, hypertension.  Patient went back to the OR on 04/23/2021 and again on 04/26/2021 for further I and D  Assessment & Plan:   Principal Problem:   Necrotizing fasciitis (HCC) Active Problems:   Cellulitis and abscess of right leg   DM (diabetes mellitus), type 1 with renal complications (HCC)   HTN (hypertension)   Acute renal failure superimposed on stage 2 chronic kidney disease (HCC)   Hypokalemia   Abscess of groin    1 necrotizing fasciitis of the right groin/thigh -Patient admitted to the general surgical service underwent incision, drainage, debridement of right thigh/groin necrotizing fasciitis with cultures obtained currently pending. -Blood cultures thus far without growth  -Currently afebrile -Leukocytosis is down to 9.3 today -Patient undergoing incision and drainage per general surgery, on 04/23/2021 and again 10/19 -Had been on IV Vanco and IV Zosyn was earlier changed to IV cefepime and IV Zyvox due to acute on chronic renal failure.  -Have since narrowed coverage to augmentin x 14 days followed by amoxicillin x 90 days, on linezolid  2.  Acute renal failure on chronic kidney disease stage II -Initial baseline creatinine 1.3-1.5 per primary nephrologist note from 06/07/2021. -Concern for possible progression of chronic kidney disease. -Acute renal failure on  chronic kidney disease likely multifactorial secondary to acute infection with necrotizing fasciitis of the right groin/thigh, concern for possible rhabdomyolysis from acute infection/injury in the setting of dehydration and in the setting of ACE inhibitor. -Renal ultrasound with unremarkable sonographic appearance of bilateral kidneys. -Urinalysis with glycosuria, nitrite negative, leukocytes negative, proteinuria. -recent Urine sodium at 13, urine creatinine at 112.45. -CK level at 769.   -Received a 500 cc bolus of normal saline on 04/23/2021 -Creatinine now stable and improved at 1.48 -Resumed home dose acei  3.  Poorly controlled diabetes mellitus type 1 -Patient noted to be on insulin pump being followed by endocrinology in the outpatient setting. On further questioning, pt has not been using insulin pump as directed secondary to cost of supplies. He has instead been self-injecting short acting insulin as needed, which was intended to be used with pump -Hemoglobin A1c 14.4 (04/23/2021). -Pt was initially continued on Levemir 22 units twice daily with SSI coverage with fair control  -Diabetes coordinator following. Appreciate input. Pt now agreeable to temporarily transition to 70/30. Will discharge on 30 units BID until he can f/u with endocrine, will prescribe at time of d/c   4.  Hypertension -Norvasc maximized to 10 mg daily.   -hydralazine maximized to  TID -Have resumed home benazepril   5.  Hypokalemia -Replaced  6.  Hyponatremia -Likely secondary to volume depletion. -Improved with hydration.  7.  Lower extremity edema Patient with lower extremity edema right greater than left with some tightness in his thigh.   -Patient with albumin of 2.2.   -Patient was earlier given IV albumin 25 g x 1.  Lasix 20 mg IV x1.  DVT prophylaxis: Lovenox subq Code Status: Full Family Communication: Pt in room, family currently not at bedside  Status is: Inpatient  Remains  inpatient appropriate because: severity of illness  Consultants:  [facility] hospital[facility]: Dr. Ronaldo Miyamoto 04/22/2021   Procedures:  CT pelvis 04/21/2021 Chest x-ray 04/21/2021 Renal ultrasound 04/22/2021 Incision, drainage, debridement of right thigh/groin necrotizing fasciitis per Dr. Magnus Ivan, general surgery 04/22/2021 Irrigation and debridement right groin wound, right thigh and potential further debridement per Dr. Magnus Ivan 04/23/2021   Antimicrobials: Anti-infectives (From admission, onward)    Start     Dose/Rate Route Frequency Ordered Stop   05/13/21 0000  amoxicillin (AMOXIL) 500 MG capsule        500 mg Oral Every 8 hours 05/01/21 0910 08/11/21 2359   05/12/21 2200  amoxicillin (AMOXIL) capsule 500 mg       See Hyperspace for full Linked Orders Report.   500 mg Oral Every 8 hours 04/28/21 1000 08/10/21 2159   05/01/21 0000  amoxicillin-clavulanate (AUGMENTIN) 875-125 MG tablet        1 tablet Oral Every 12 hours 05/01/21 0910 05/13/21 2359   04/28/21 2200  amoxicillin-clavulanate (AUGMENTIN) 875-125 MG per tablet 1 tablet       See Hyperspace for full Linked Orders Report.   1 tablet Oral Every 12 hours 04/28/21 1000 05/12/21 2159   04/28/21 1700  ceFEPIme (MAXIPIME) 2 g in sodium chloride 0.9 % 100 mL IVPB        2 g 200 mL/hr over 30 Minutes Intravenous  Once 04/28/21 1000 04/28/21 1835   04/24/21 1000  ceFEPIme (MAXIPIME) 2 g in sodium chloride 0.9 % 100 mL IVPB  Status:  Discontinued        2 g 200 mL/hr over 30 Minutes Intravenous Every 8 hours 04/24/21 0925 04/28/21 1000   04/23/21 1400  linezolid (ZYVOX) IVPB 600 mg  Status:  Discontinued       Note to Pharmacy: Pharm to adjust dose   600 mg 300 mL/hr over 60 Minutes Intravenous Every 12 hours 04/23/21 1123 04/29/21 1000   04/23/21 1300  ceFEPIme (MAXIPIME) 2 g in sodium chloride 0.9 % 100 mL IVPB  Status:  Discontinued        2 g 200 mL/hr over 30 Minutes Intravenous Every 12 hours 04/23/21 1236 04/24/21 0925   04/23/21  0800  vancomycin (VANCOCIN) IVPB 1000 mg/200 mL premix  Status:  Discontinued        1,000 mg 200 mL/hr over 60 Minutes Intravenous Every 24 hours 04/22/21 0254 04/22/21 0306   04/22/21 0800  vancomycin (VANCOCIN) IVPB 1000 mg/200 mL premix  Status:  Discontinued        1,000 mg 200 mL/hr over 60 Minutes Intravenous Every 24 hours 04/22/21 0306 04/23/21 1123   04/22/21 0400  piperacillin-tazobactam (ZOSYN) IVPB 3.375 g  Status:  Discontinued        3.375 g 12.5 mL/hr over 240 Minutes Intravenous Every 8 hours 04/22/21 0254 04/23/21 1123   04/21/21 1900  piperacillin-tazobactam (ZOSYN) IVPB 3.375 g        3.375 g 100 mL/hr over 30 Minutes Intravenous  Once 04/21/21 1846 04/21/21 2010   04/21/21 1845  vancomycin (VANCOCIN) IVPB 1000 mg/200 mL premix        1,000 mg 200 mL/hr over 60 Minutes Intravenous  Once 04/21/21 1843 04/21/21 2124   04/21/21 1845  ceFEPIme (MAXIPIME) 2 g in sodium chloride 0.9 % 100 mL IVPB  Status:  Discontinued  2 g 200 mL/hr over 30 Minutes Intravenous  Once 04/21/21 1843 04/21/21 1846   04/21/21 1815  clindamycin (CLEOCIN) IVPB 600 mg        600 mg 100 mL/hr over 30 Minutes Intravenous  Once 04/21/21 1802 04/21/21 1908       Subjective: Eager to go home today  Objective: Vitals:   04/29/21 2154 04/30/21 0559 04/30/21 1354 05/01/21 0537  BP: (!) 177/77 (!) 174/96 (!) 163/75 (!) 155/77  Pulse: 93 95 98 97  Resp: 18 18 18 18   Temp: 98.6 F (37 C) 98.3 F (36.8 C) 99 F (37.2 C) 98.9 F (37.2 C)  TempSrc: Oral Oral Oral Oral  SpO2: 95% 98% 97% 95%  Weight:      Height:        Intake/Output Summary (Last 24 hours) at 05/01/2021 0911 Last data filed at 05/01/2021 0600 Gross per 24 hour  Intake 1740 ml  Output 4625 ml  Net -2885 ml    Filed Weights   04/21/21 1504 04/26/21 0942  Weight: 111.6 kg 111.6 kg    Examination: General exam: Conversant, in no acute distress Respiratory system: normal chest rise, clear, no audible  wheezing Cardiovascular system: regular rhythm, s1-s2 Gastrointestinal system: Nondistended, nontender, pos BS Central nervous system: No seizures, no tremors Extremities: No cyanosis, no joint deformities Skin: No rashes, no pallor Psychiatry: Affect normal // no auditory hallucinations   Data Reviewed: I have personally reviewed following labs and imaging studies  CBC: Recent Labs  Lab 04/27/21 0335 04/28/21 0326 04/29/21 0359 04/30/21 0424 05/01/21 0409  WBC 19.3* 12.9* 11.0* 11.5* 9.3  NEUTROABS 14.1* 8.2* 7.1 6.9 5.3  HGB 9.8* 9.7* 9.8* 10.3* 9.7*  HCT 29.8* 30.3* 31.1* 32.6* 30.2*  MCV 86.1 86.8 87.4 86.9 87.5  PLT 505* 507* 551* 563* 511*    Basic Metabolic Panel: Recent Labs  Lab 04/26/21 0341 04/27/21 0335 04/28/21 0326 04/28/21 2211 04/29/21 0359 04/30/21 0424 05/01/21 0409  NA 134* 135 133* 133* 133* 134* 135  K 3.8 3.7 3.6 3.9 3.7 3.9 3.8  CL 107 107 103 103 106 104 104  CO2 18* 22 21* 21* 21* 23 24  GLUCOSE 137* 164* 123* 281* 257* 247* 203*  BUN 21* 18 14 15 12 13 13   CREATININE 1.64* 1.33* 1.54* 1.58* 1.60* 1.51* 1.48*  CALCIUM 8.0* 8.0* 8.2* 8.4* 7.9* 8.3* 8.2*  MG 1.8  --   --   --   --   --   --   PHOS 3.0 3.6 3.5  --  3.5 4.2 3.9    GFR: Estimated Creatinine Clearance: 87.1 mL/min (A) (by C-G formula based on SCr of 1.48 mg/dL (H)). Liver Function Tests: Recent Labs  Lab 04/27/21 0335 04/28/21 0326 04/29/21 0359 04/30/21 0424 05/01/21 0409  ALBUMIN 1.9* 2.2* 2.1* 2.3* 2.1*    No results for input(s): LIPASE, AMYLASE in the last 168 hours. No results for input(s): AMMONIA in the last 168 hours. Coagulation Profile: No results for input(s): INR, PROTIME in the last 168 hours.  Cardiac Enzymes: No results for input(s): CKTOTAL, CKMB, CKMBINDEX, TROPONINI in the last 168 hours.  BNP (last 3 results) No results for input(s): PROBNP in the last 8760 hours. HbA1C: No results for input(s): HGBA1C in the last 72 hours. CBG: Recent  Labs  Lab 04/30/21 0753 04/30/21 1140 04/30/21 1633 04/30/21 2154 05/01/21 0746  GLUCAP 245* 82 140* 149* 258*    Lipid Profile: No results for input(s): CHOL, HDL, LDLCALC, TRIG, CHOLHDL,  LDLDIRECT in the last 72 hours. Thyroid Function Tests: No results for input(s): TSH, T4TOTAL, FREET4, T3FREE, THYROIDAB in the last 72 hours. Anemia Panel: No results for input(s): VITAMINB12, FOLATE, FERRITIN, TIBC, IRON, RETICCTPCT in the last 72 hours. Sepsis Labs: No results for input(s): PROCALCITON, LATICACIDVEN in the last 168 hours.   Recent Results (from the past 240 hour(s))  Blood culture (routine x 2)     Status: None   Collection Time: 04/21/21  5:45 PM   Specimen: BLOOD  Result Value Ref Range Status   Specimen Description   Final    BLOOD BLOOD LEFT FOREARM Performed at [facility], [address] [place], [place] [address]    Special Requests   Final    BOTTLES DRAWN AEROBIC AND ANAEROBIC Blood Culture adequate volume Performed at [facility] [address] [place], [place] [address]    Culture   Final    NO GROWTH 5 DAYS Performed at [facility], [address], [place], [place] [address]    Report Status 04/26/2021 FINAL  Final  Blood culture (routine x 2)     Status: None   Collection Time: 04/21/21  6:30 PM   Specimen: BLOOD  Result Value Ref Range Status   Specimen Description   Final    BLOOD RIGHT ANTECUBITAL Performed at [facility], [address] [place], [place] [address]    Special Requests   Final    BOTTLES DRAWN AEROBIC AND ANAEROBIC Blood Culture results may not be optimal due to an excessive volume of blood received in culture bottles Performed at [facility], [address] [place], [place] [address]    Culture   Final    NO GROWTH 5 DAYS Performed at [facility], [address], [place], [place] [address]    Report Status 04/26/2021 FINAL  Final  Resp Panel by RT-PCR (Flu  A&B, Covid) Nasopharyngeal Swab     Status: None   Collection Time: 04/21/21  6:45 PM   Specimen: Nasopharyngeal Swab; Nasopharyngeal(NP) swabs in vial transport medium  Result Value Ref Range Status   SARS Coronavirus 2 by RT PCR NEGATIVE NEGATIVE Final    Comment: (NOTE) SARS-CoV-2 target nucleic acids are NOT DETECTED.  The SARS-CoV-2 RNA is generally detectable in upper respiratory specimens during the acute phase of infection. The lowest concentration of SARS-CoV-2 viral copies this assay can detect is 138 copies/mL. A negative result does not preclude SARS-Cov-2 infection and should not be used as the sole basis for treatment or other patient management decisions. A negative result may occur with  improper specimen collection/handling, submission of specimen other than nasopharyngeal swab, presence of viral mutation(s) within the areas targeted by this assay, and inadequate number of viral copies(<138 copies/mL). A negative result must be combined with clinical observations, patient history, and epidemiological information. The expected result is Negative.  Fact Sheet for Patients:  BloggerCourse.com  Fact Sheet for Healthcare Providers:  SeriousBroker.it  This test is no t yet approved or cleared by the [place] FDA and  has been authorized for detection and/or diagnosis of SARS-CoV-2 by FDA under an Emergency Use Authorization (EUA). This EUA will remain  in effect (meaning this test can be used) for the duration of the COVID-19 declaration under Section 564(b)(1) of the Act, 21 U.S.C.section 360bbb-3(b)(1), unless the authorization is terminated  or revoked sooner.       Influenza A  by PCR NEGATIVE NEGATIVE Final   Influenza B by PCR NEGATIVE NEGATIVE Final    Comment: (NOTE) The Xpert Xpress SARS-CoV-2/FLU/RSV plus assay is intended as an aid in the diagnosis of influenza from Nasopharyngeal swab specimens  and should not be used as a sole basis for treatment. Nasal washings and aspirates are unacceptable for Xpert Xpress SARS-CoV-2/FLU/RSV testing.  Fact Sheet for Patients: BloggerCourse.com  Fact Sheet for Healthcare Providers: SeriousBroker.it  This test is not yet approved or cleared by the [place] FDA and has been authorized for detection and/or diagnosis of SARS-CoV-2 by FDA under an Emergency Use Authorization (EUA). This EUA will remain in effect (meaning this test can be used) for the duration of the COVID-19 declaration under Section 564(b)(1) of the Act, 21 U.S.C. section 360bbb-3(b)(1), unless the authorization is terminated or revoked.  Performed at [facility], [address] [place], [place] [address]   Urine Culture     Status: None   Collection Time: 04/21/21 10:25 PM   Specimen: In/Out Cath Urine  Result Value Ref Range Status   Specimen Description   Final    IN/OUT CATH URINE Performed at [facility], [address] [place], [place] [address]    Special Requests   Final    NONE Performed at [facility], [address] [place], [place] [address]    Culture   Final    NO GROWTH Performed at [facility], [address] [place] [place], [place] [address]    Report Status 04/23/2021 FINAL  Final  Aerobic/Anaerobic Culture w Gram Stain (surgical/deep wound)     Status: None   Collection Time: 04/22/21 12:29 AM   Specimen: Abscess  Result Value Ref Range Status   Specimen Description   Final    ABSCESS Performed at [facility], [address], [place], [place] [address]    Special Requests   Final    NONE Performed at [facility], [address], [place], [place] [address]    Gram Stain   Final    FEW WBC PRESENT,BOTH PMN AND MONONUCLEAR ABUNDANT GRAM POSITIVE COCCI IN CLUSTERS IN TETRADS ABUNDANT GRAM NEGATIVE RODS     Culture   Final    RARE ACTINOMYCES SPECIES RARE STAPHYLOCOCCUS HOMINIS FEW PREVOTELLA BIVIA BETA LACTAMASE POSITIVE Standardized susceptibility testing for this organism is not available. FOR ACTINOMYCES SPECIES Performed at [facility], [address] [place], [place] [address]    Report Status 04/27/2021 FINAL  Final      Radiology Studies: No results found.  Scheduled Meds:  acetaminophen  1,000 mg Oral QID   amLODipine  10 mg Oral Daily   amoxicillin-clavulanate  1 tablet Oral Q12H   Followed by   Melene Muller ON 05/12/2021] amoxicillin  500 mg Oral Q8H   benazepril  20 mg Oral Daily   enoxaparin (LOVENOX) injection  40 mg Subcutaneous Q24H   feeding supplement  237 mL Oral BID BM   gabapentin  300 mg Oral BID   hydrALAZINE  100 mg Oral Q8H   insulin aspart  0-15 Units Subcutaneous TID WC   insulin aspart protamine- aspart  25 Units Subcutaneous BID WC   methocarbamol  500 mg Oral QID   senna-docusate  1 tablet Oral BID   sucralfate  1 g Oral TID WC & HS   Continuous Infusions:  sodium chloride Stopped (04/29/21 0624)     LOS: 9  days   Rickey Barbara, MD [facility] Pager On Amion  If 7PM-7AM, please contact night-coverage 05/01/2021, 9:11 AM

## 2021-05-01 NOTE — Progress Notes (Signed)
Discharge instructions discussed with patient, verbalized agreement and understanding 

## 2021-05-01 NOTE — Discharge Summary (Signed)
[facility] Discharge Summary   Patient ID: Bradley Davis MRN: [id number] DOB/AGE: Feb 24, 1986 35 y.o.  Admit date: 04/21/2021 Discharge date: 05/01/2021  Admitting Diagnosis: Necrotizing fasciitis [facility]) [M72.6] Abscess of groin [L02.214] Sepsis, due to unspecified organism, unspecified whether acute organ dysfunction present [facility]) [A41.9] Cellulitis and abscess of right leg [L03.115, L02.415]   Discharge Diagnosis Patient Active Problem List   Diagnosis Date Noted   DM (diabetes mellitus), 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   HTN (hypertension) 04/23/2021   Necrotizing fasciitis (HCC) 04/22/2021  S/p irrigation and debridement of right groin and right thigh wounds  Consultants [facility]  Imaging: No results found.  Procedures Dr. Magnus Ivan (04/22/21) - incision, drainage, debridement of right thigh/groin for necrotizing fasciitis Dr. Magnus Ivan (04/23/21) - irrigation and debridement right groin wound, right thigh and potential further debridement Dr. Dwain Sarna (04/26/21) -  dressing change right groin, incision and drainage right anterior thigh with Penrose placement for evacuation of infection  Hospital Course:  35 year old male with type 1 diabetes who presented to [facility] emergency department with right groin pain/abscess that has been gradually worsening.  He had previously been seen at [facility] medical for evaluation and placed on antibiotic, clindamycin, despite antibiotic he continued to worsen.  Workup showed an elevated white blood count, lactic acid level, and a CT scan showing a soft tissue infection in the right medial thigh and groin with air worrisome for possible necrotizing infection.  Patient was admitted and underwent procedures listed above.  Tolerated procedures well and was transferred to the floor following each procedure.  He tolerated diet well between and following his procedures.  On POD9 from his initial  surgery, the patient was voiding well, tolerating diet, ambulating well, pain well controlled, vital signs stable, wounds clean  and felt stable for discharge home.  He and mother were instructed on wound care and was tolerating dressing changes well at time of discharge. He was discharged with 2 penrose drains in place and on oral augmentin x14 days followed by amoxicillin x 90 days after being on IV antibiotics during admission.  [facility] team was consulted during admission for assistance with management of acute renal failure on chronic CKDII, poorly controlled Type 1 DM, hypertension, electrolyte imbalances, and lower extremity edema. His kidney function improved by time of discharge and his home antihypertensive therapy resumed prior to discharge. With assistance of diabetes coordinator his insulin regimen (previously on pump) was adjusted and he was instructed to follow up with endocrinology after discharge. Electrolyte repletion was given. He was treated with albumin and lasix for lower extremity edema.  Patient will follow up in our office in 4 weeks and knows to call with questions or concerns. He was instructed to follow up with infectious disease and PCP.  I or a member of my team have reviewed this patient in the Controlled Substance Database. Allergies as of 05/01/2021       Reactions   Ibuprofen Swelling   Pt reports taking after reported allergy w/ no reactions        Medication List     STOP taking these medications    clindamycin 300 MG capsule Commonly known as: CLEOCIN   insulin lispro 100 UNIT/ML injection Commonly known as: HUMALOG   Jardiance 25 MG Tabs tablet Generic drug: empagliflozin   NOVOLOG [place]       TAKE these medications    acetaminophen 325 MG tablet Commonly known as: TYLENOL Take 650 mg  by mouth every 6 (six) hours as needed for mild pain or headache.   amLODipine 10 MG tablet Commonly known as: NORVASC Take 1 tablet (10 mg total) by  mouth daily.   amoxicillin 500 MG capsule Commonly known as: AMOXIL Take 1 capsule (500 mg total) by mouth every 8 (eight) hours. Start taking on: May 13, 2021   amoxicillin-clavulanate 875-125 MG tablet Commonly known as: AUGMENTIN Take 1 tablet by mouth every 12 (twelve) hours for 12 days.   atorvastatin 80 MG tablet Commonly known as: LIPITOR Take 1 tablet (80 mg total) by mouth daily. What changed: how much to take   benazepril 20 MG tablet Commonly known as: LOTENSIN Take 20 mg by mouth daily.   hydrALAZINE 100 MG tablet Commonly known as: APRESOLINE Take 1 tablet (100 mg total) by mouth every 8 (eight) hours.   Insulin Pen Needle 31G X 5 MM Misc 1 Device by Does not apply route 2 (two) times daily. For use with insulin pens   methocarbamol 500 MG tablet Commonly known as: ROBAXIN Take 1 tablet (500 mg total) by mouth every 6 (six) hours as needed for up to 7 days for muscle spasms.   NovoLOG 70/30 FlexPen (70-30) 100 UNIT/ML FlexPen Generic drug: insulin aspart protamine - aspart Inject 30 Units into the skin 2 (two) times daily with a meal.   Ocean Nasal Spray 0.65 % nasal spray Generic drug: sodium chloride Place 1 spray into the nose daily as needed for congestion.   oxyCODONE 5 MG immediate release tablet Commonly known as: Oxy IR/ROXICODONE Take 2 tablets (10 mg total) by mouth every 6 (six) hours as needed for up to 5 days for moderate pain.   sucralfate 1 g tablet Commonly known as: Carafate Take 1 tablet (1 g total) by mouth 4 (four) times daily -  with meals and at bedtime.   Vitamin D (Ergocalciferol) 1.25 MG (50000 UNIT) Caps capsule Commonly known as: DRISDOL Take 50,000 Units by mouth 2 (two) times a week. Monday, wed.          Follow-up Information     [facility] Follow up.   Why: Please call [phone] or go to this website: [email or web address]. This program provides medication assistance to uninsured  patients.        [facility], [place]. Go on 05/18/2021.   Specialty: General Surgery Why: Your appointment is 05/18/21 at 2pm Please arrive 30 minutes prior to your appointment to check in and fill out paperwork. Bring photo ID and insurance information. Contact information: [address] [place] [address] [phone]        Andreas Blower., MD Follow up.   Specialty: Internal Medicine Why: For follow up of your diabetes Contact information: [address] [place] [address] [phone]         [facility] Follow up.   Specialty: Infectious Diseases Contact information: [address] [id number] mc [place] [address] [phone]                Signed: Clarise Cruz [facility] 05/01/2021, 10:52 AM Please see Amion for pager number during day hours 7:00am-4:30pm

## 2021-05-03 LAB — GLUCOSE, CAPILLARY
Glucose-Capillary: 106 mg/dL — ABNORMAL HIGH (ref 70–99)
Glucose-Capillary: 253 mg/dL — ABNORMAL HIGH (ref 70–99)

## 2021-06-06 ENCOUNTER — Other Ambulatory Visit: Payer: Self-pay

## 2021-06-06 ENCOUNTER — Ambulatory Visit (INDEPENDENT_AMBULATORY_CARE_PROVIDER_SITE_OTHER): Payer: Self-pay | Admitting: Primary Care

## 2021-06-06 ENCOUNTER — Encounter (INDEPENDENT_AMBULATORY_CARE_PROVIDER_SITE_OTHER): Payer: Self-pay | Admitting: Primary Care

## 2021-06-06 VITALS — BP 157/99 | HR 104 | Temp 97.5°F | Ht 70.0 in | Wt 245.1 lb

## 2021-06-06 DIAGNOSIS — Z09 Encounter for follow-up examination after completed treatment for conditions other than malignant neoplasm: Secondary | ICD-10-CM

## 2022-08-20 ENCOUNTER — Other Ambulatory Visit: Payer: Self-pay

## 2022-08-20 ENCOUNTER — Emergency Department (HOSPITAL_BASED_OUTPATIENT_CLINIC_OR_DEPARTMENT_OTHER)
Admission: EM | Admit: 2022-08-20 | Discharge: 2022-08-20 | Disposition: A | Discharge status: Home / Self Care | Payer: Medicaid Other | Attending: Emergency Medicine | Admitting: Emergency Medicine

## 2022-08-20 DIAGNOSIS — Z794 Long term (current) use of insulin: Secondary | ICD-10-CM | POA: Diagnosis not present

## 2022-08-20 DIAGNOSIS — B349 Viral infection, unspecified: Secondary | ICD-10-CM | POA: Diagnosis not present

## 2022-08-20 DIAGNOSIS — R509 Fever, unspecified: Secondary | ICD-10-CM | POA: Diagnosis present

## 2022-08-20 DIAGNOSIS — Z1152 Encounter for screening for COVID-19: Secondary | ICD-10-CM | POA: Diagnosis not present

## 2022-08-20 LAB — RESP PANEL BY RT-PCR (RSV, FLU A&B, COVID)  RVPGX2
Influenza A by PCR: NEGATIVE
Influenza B by PCR: NEGATIVE
Resp Syncytial Virus by PCR: NEGATIVE
SARS Coronavirus 2 by RT PCR: NEGATIVE

## 2022-08-20 MED ORDER — ONDANSETRON 4 MG PO TBDP: ORAL_TABLET | ORAL | 0 refills | Status: DC

## 2022-08-20 MED ORDER — BENZONATATE 100 MG PO CAPS: 100.0000 mg | ORAL_CAPSULE | Freq: Three times a day (TID) | ORAL | 0 refills | Status: DC

## 2022-08-20 MED ORDER — AMOXICILLIN-POT CLAVULANATE 875-125 MG PO TABS: 1.0000 | ORAL_TABLET | Freq: Two times a day (BID) | ORAL | 0 refills | Status: DC

## 2022-08-20 NOTE — Discharge Instructions (Signed)
Take tylenol 2 pills 4 times a day and motrin 4 pills 3 times a day.  Drink plenty of fluids.  Return for worsening shortness of breath, headache, confusion. Follow up with your family doctor.   

## 2022-08-20 NOTE — ED Provider Notes (Signed)
[facility] Provider Note   CSN: [id number] Arrival date & time: 08/20/22  2110     History  Chief Complaint  Patient presents with   Flu Like Symptoms    Bradley Davis is a 37 y.o. male.  37 yo M with a chief complaint of congestion fevers chills myalgias.  Going on for about a week.  No known sick contacts.  He tells me he feels just kind of bleh.  He feels like his blood sugars have been a little bit erratic.  Denies any abdominal pain.  Has had 2 episodes of emesis.  Otherwise been able to keep things down.  Complaining of a frontal headache.  Denies trauma.        Home Medications Prior to Admission medications   Medication Sig Start Date End Date Taking? Authorizing Provider  amoxicillin-clavulanate (AUGMENTIN) 875-125 MG tablet Take 1 tablet by mouth every 12 (twelve) hours. 08/20/22  Yes Deno Etienne, DO  benzonatate (TESSALON) 100 MG capsule Take 1 capsule (100 mg total) by mouth every 8 (eight) hours. 08/20/22  Yes Deno Etienne, DO  ondansetron (ZOFRAN-ODT) 4 MG disintegrating tablet 60m ODT q4 hours prn nausea/vomit 08/20/22  Yes FDeno Etienne DO  acetaminophen (TYLENOL) 325 MG tablet Take 650 mg by mouth every 6 (six) hours as needed for mild pain or headache.    [provider]  amLODipine (NORVASC) 10 MG tablet Take 1 tablet (10 mg total) by mouth daily. 05/01/21 05/31/21  KWinferd Humphrey PA-C  atorvastatin (LIPITOR) 80 MG tablet Take 1 tablet (80 mg total) by mouth daily. 05/01/21   KWinferd Humphrey PA-C  benazepril (LOTENSIN) 20 MG tablet Take 20 mg by mouth daily. 05/22/20   [provider]  hydrALAZINE (APRESOLINE) 100 MG tablet Take 1 tablet (100 mg total) by mouth every 8 (eight) hours. 05/01/21 05/31/21  KWinferd Humphrey PA-C  insulin aspart protamine - aspart (NOVOLOG 70/30 FLEXPEN) (70-30) 100 UNIT/ML FlexPen Inject 30 Units into the skin 2 (two) times daily with a meal. 05/01/21   KWinferd Humphrey PA-C  Insulin Pen Needle 31G X 5 MM MISC 1 Device by Does not apply route 2 (two) times daily. For use with insulin pens 05/01/21   KWinferd Humphrey PA-C  Saccharomyces boulardii (PROBIOTIC) 250 MG CAPS Take 250 mg by mouth daily at 12 noon. 05/01/21   KWinferd Humphrey PA-C  sodium chloride (OCEAN) 0.65 % nasal spray Place 1 spray into the nose daily as needed for congestion.    [provider]  sucralfate (CARAFATE) 1 g tablet Take 1 tablet (1 g total) by mouth 4 (four) times daily -  with meals and at bedtime. 04/07/17   MJanne Napoleon NP  Vitamin D, Ergocalciferol, (DRISDOL) 1.25 MG (50000 UNIT) CAPS capsule Take 50,000 Units by mouth 2 (two) times a week. Monday, wed. 03/02/21   [provider]      Allergies    Ibuprofen    Review of Systems   Review of Systems  Physical Exam Updated Vital Signs BP (!) 141/95 (BP Location: Left Arm)   Pulse (!) 107   Temp 100 F (37.8 C)   Resp 18   Ht 5' 10"$  (1.778 m)   Wt 104.3 kg   SpO2 97%   BMI 33.00 kg/m  Physical Exam Vitals and nursing note reviewed.  Constitutional:      Appearance: He is well-developed.  HENT:     Head:  Normocephalic and atraumatic.     Comments: Swollen turbinates, posterior nasal drip, no noted sinus ttp, tm normal bilaterally.   Eyes:     Pupils: Pupils are equal, round, and reactive to light.  Neck:     Vascular: No JVD.  Cardiovascular:     Rate and Rhythm: Normal rate and regular rhythm.     Heart sounds: No murmur heard.    No friction rub. No gallop.  Pulmonary:     Effort: No respiratory distress.     Breath sounds: No wheezing.  Abdominal:     General: There is no distension.     Tenderness: There is no abdominal tenderness. There is no guarding or rebound.  Musculoskeletal:        General: Normal range of motion.     Cervical back: Normal range of motion and neck supple.  Skin:    Coloration: Skin is not pale.     Findings: No rash.  Neurological:     Mental Status:  He is alert and oriented to person, place, and time.  Psychiatric:        Behavior: Behavior normal.     ED Results / Procedures / Treatments   Labs (all labs ordered are listed, but only abnormal results are displayed) Labs Reviewed  RESP PANEL BY RT-PCR (RSV, FLU A&B, COVID)  [id number]    EKG None  Radiology No results found.  Procedures Procedures    Medications Ordered in ED Medications - No data to display  ED Course/ Medical Decision Making/ A&P                             Medical Decision Making Risk Prescription drug management.   37 yo M with cough congestion fevers chills myalgias.  Going on for about 4 to 5 days now.  He is well-appearing and nontoxic.  Clear lung sounds bilaterally.  He does have signs of possible sinusitis based on history.  Describing a severe headache.  Will treat with antibiotics.  PCP follow-up.  9:30 PM:  I have discussed the diagnosis/risks/treatment options with the patient.  Evaluation and diagnostic testing in the emergency department does not suggest an emergent condition requiring admission or immediate intervention beyond what has been performed at this time.  They will follow up with PCP. We also discussed returning to the ED immediately if new or worsening sx occur. We discussed the sx which are most concerning (e.g., sudden worsening pain, fever, inability to tolerate by mouth) that necessitate immediate return. Medications administered to the patient during their visit and any new prescriptions provided to the patient are listed below.  Medications given during this visit Medications - No data to display   The patient appears reasonably screen and/or stabilized for discharge and I doubt any other medical condition or other [facility] requiring further screening, evaluation, or treatment in the ED at this time prior to discharge.          Final Clinical Impression(s) / ED Diagnoses Final diagnoses:  Acute viral syndrome    Rx  / DC Orders ED Discharge Orders          Ordered    benzonatate (TESSALON) 100 MG capsule  Every 8 hours        08/20/22 2127    ondansetron (ZOFRAN-ODT) 4 MG disintegrating tablet        08/20/22 2127    amoxicillin-clavulanate (AUGMENTIN) 875-125 MG tablet  Every 12  hours        08/20/22 2127              Deno Etienne, DO 08/20/22 2130

## 2022-08-20 NOTE — ED Triage Notes (Signed)
Pt arrives with c/o intermittent sore throat, congestion, and fatigue that started a few days ago. Pt also endorses headache.

## 2022-11-18 IMAGING — US US RENAL
1 series · 15 of 25 positions shown · non-contrast
Comparison: Abdominal ultrasound 08/02/2017

CLINICAL DATA: AK I

EXAM:
RENAL / URINARY TRACT ULTRASOUND COMPLETE

[Series 1: us renal m[facility] & [facility] · 15 of 30 slices shown]
[im 1/30]
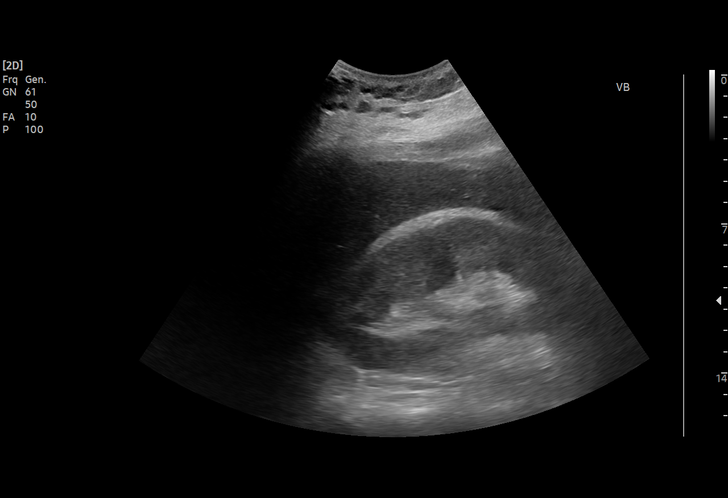
[im 3/30]
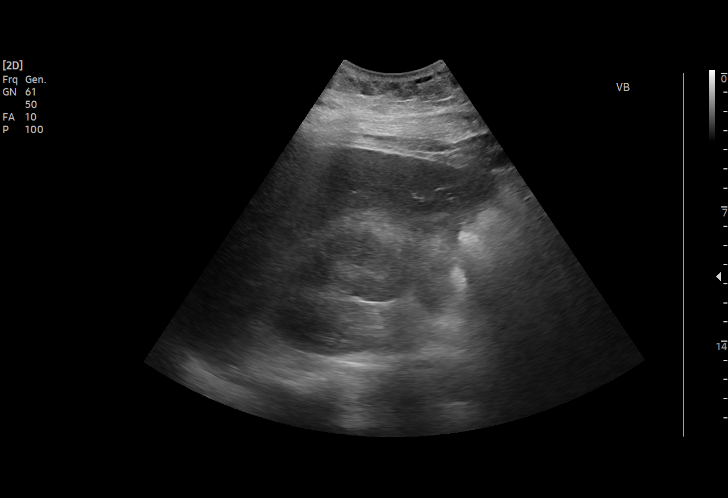
[im 5/30]
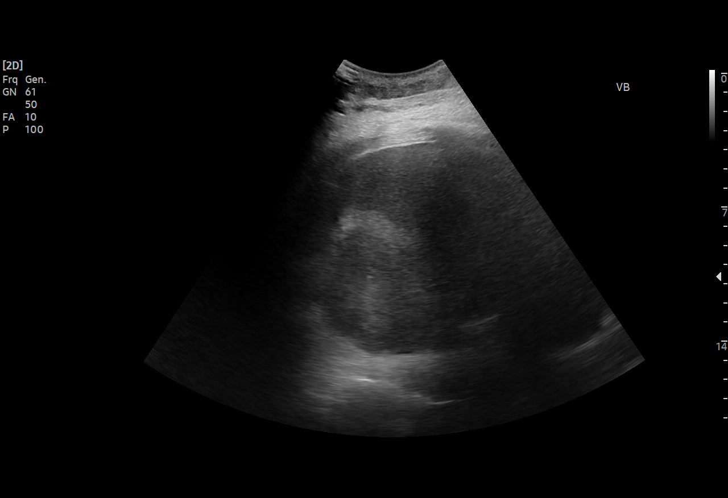
[im 7/30]
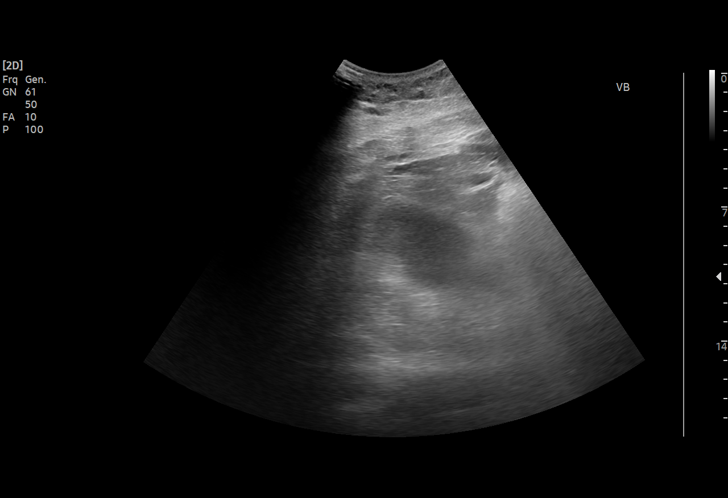
[im 9/30]
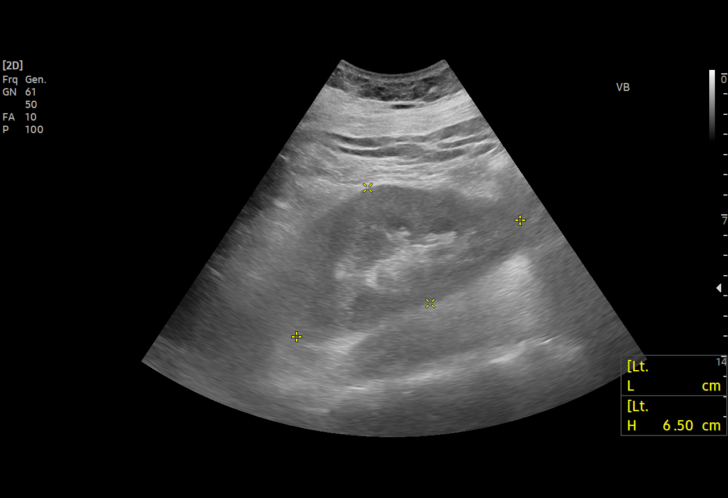
[im 11/30]
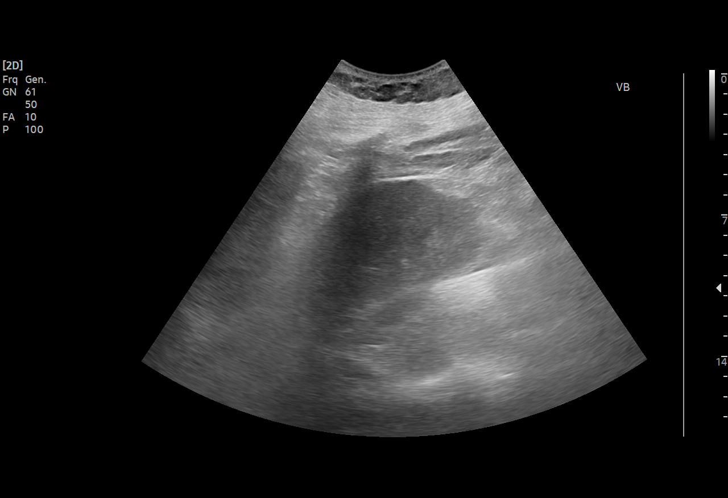
[im 13/30]
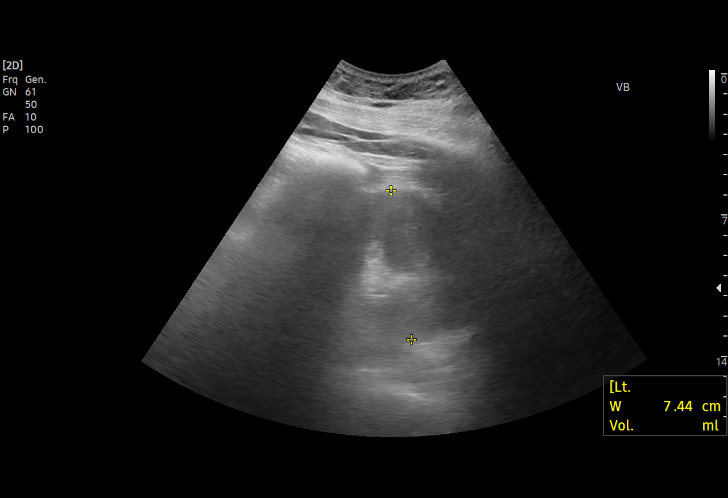
[im 15/30]
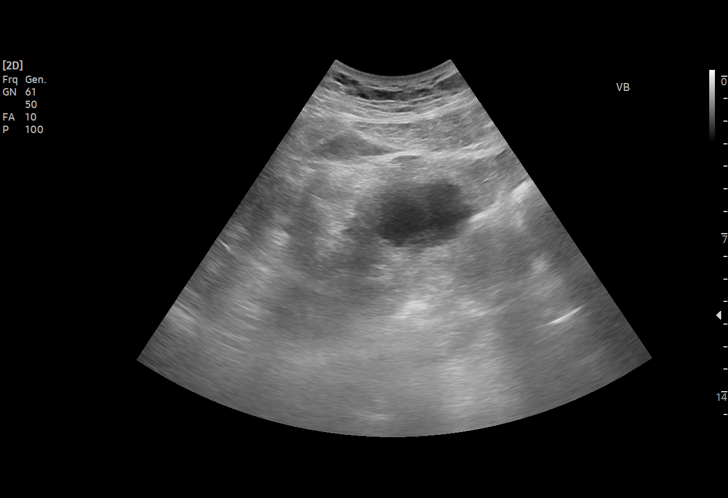
[im 17/30]
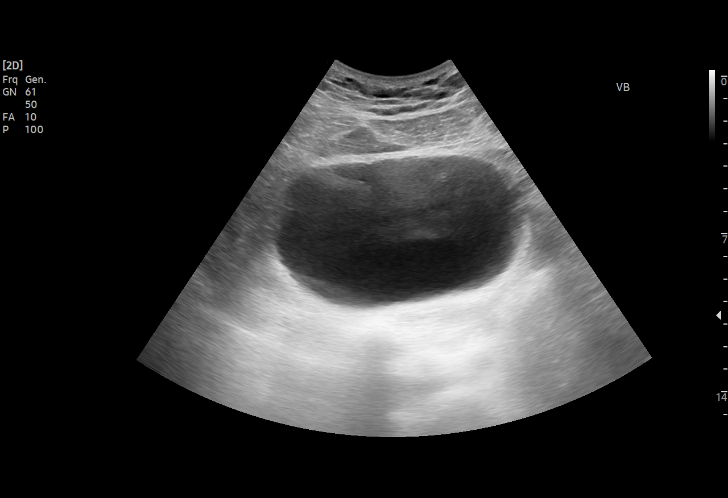
[im 19/30]
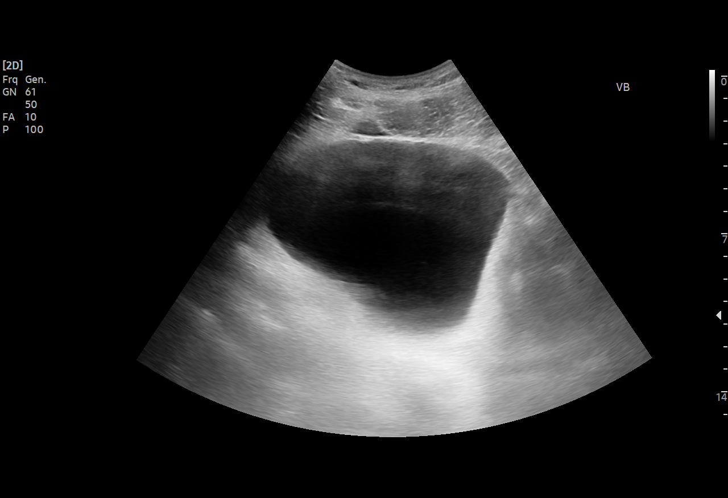
[im 21/30]
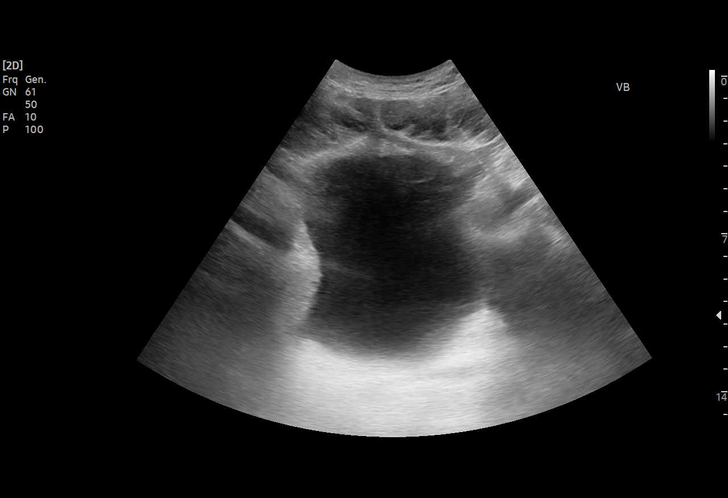
[im 23/30]
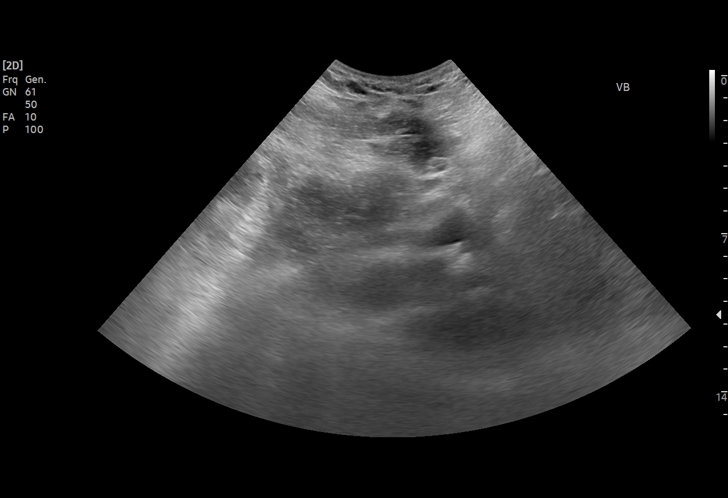
[im 25/30]
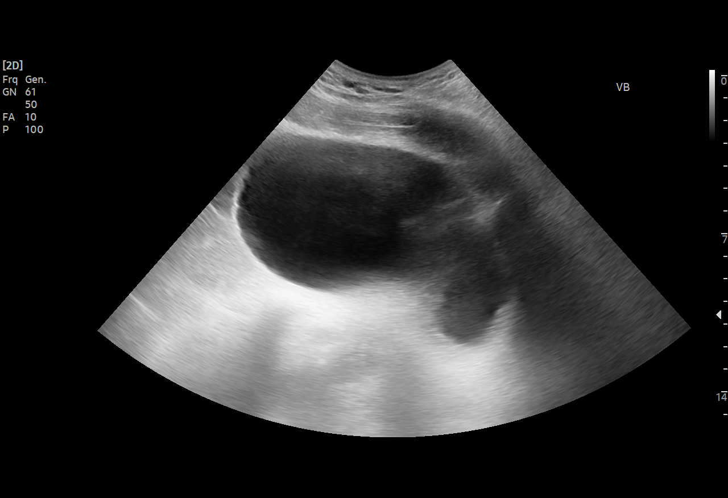
[im 27/30]
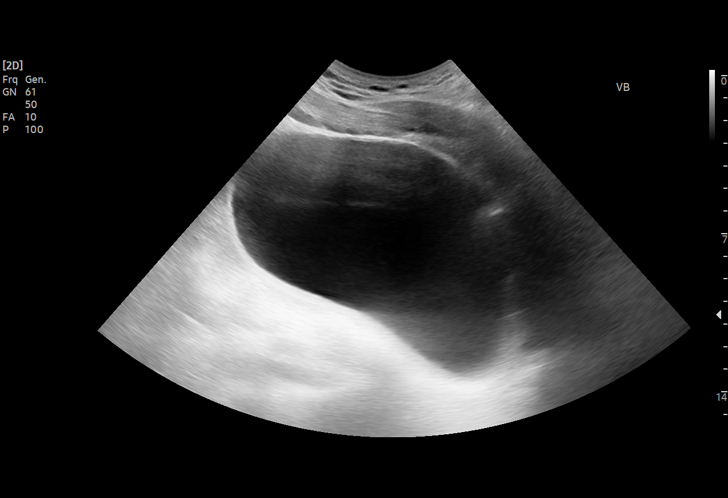
[im 30/30]
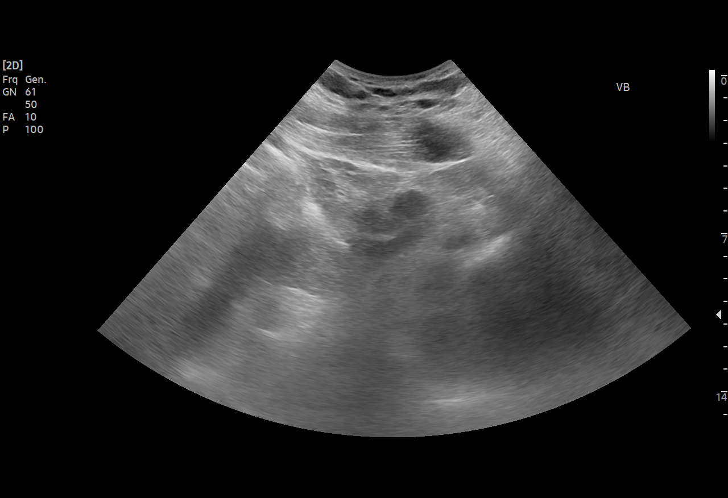

[15 of 25 positions shown; findings below may reference images not displayed]

FINDINGS: Right Kidney:

Renal measurements: 14.0 x 5.7 x 7.3 cm = volume: 301 mL.
Echogenicity within normal limits. No mass or hydronephrosis
visualized.

Left Kidney:

Renal measurements: 12.5 x 6.5 x 7.4 cm = volume: 316 mL.
Echogenicity within normal limits. No mass or hydronephrosis
visualized.

Bladder:

Appears normal for degree of bladder distention.

Other:

None.
IMPRESSION: Unremarkable sonographic appearance of the bilateral kidneys.

## 2023-02-27 ENCOUNTER — Inpatient Hospital Stay (HOSPITAL_BASED_OUTPATIENT_CLINIC_OR_DEPARTMENT_OTHER)
Admission: EM | Admit: 2023-02-27 | Discharge: 2023-03-08 | DRG: 682 | Disposition: A | Discharge status: Rehabilitation Facility | Payer: Medicaid Other | Attending: Internal Medicine | Admitting: Internal Medicine

## 2023-02-27 ENCOUNTER — Other Ambulatory Visit: Payer: Self-pay

## 2023-02-27 ENCOUNTER — Encounter (HOSPITAL_BASED_OUTPATIENT_CLINIC_OR_DEPARTMENT_OTHER): Payer: Self-pay | Admitting: Emergency Medicine

## 2023-02-27 DIAGNOSIS — Z794 Long term (current) use of insulin: Secondary | ICD-10-CM

## 2023-02-27 DIAGNOSIS — E1169 Type 2 diabetes mellitus with other specified complication: Secondary | ICD-10-CM | POA: Diagnosis not present

## 2023-02-27 DIAGNOSIS — E860 Dehydration: Secondary | ICD-10-CM | POA: Diagnosis present

## 2023-02-27 DIAGNOSIS — N179 Acute kidney failure, unspecified: Secondary | ICD-10-CM | POA: Diagnosis not present

## 2023-02-27 DIAGNOSIS — I6381 Other cerebral infarction due to occlusion or stenosis of small artery: Secondary | ICD-10-CM | POA: Diagnosis not present

## 2023-02-27 DIAGNOSIS — Z6836 Body mass index (BMI) 36.0-36.9, adult: Secondary | ICD-10-CM | POA: Diagnosis not present

## 2023-02-27 DIAGNOSIS — E1159 Type 2 diabetes mellitus with other circulatory complications: Secondary | ICD-10-CM | POA: Diagnosis not present

## 2023-02-27 DIAGNOSIS — K3184 Gastroparesis: Secondary | ICD-10-CM | POA: Diagnosis present

## 2023-02-27 DIAGNOSIS — R112 Nausea with vomiting, unspecified: Secondary | ICD-10-CM | POA: Diagnosis present

## 2023-02-27 DIAGNOSIS — E1065 Type 1 diabetes mellitus with hyperglycemia: Secondary | ICD-10-CM | POA: Diagnosis present

## 2023-02-27 DIAGNOSIS — E8809 Other disorders of plasma-protein metabolism, not elsewhere classified: Secondary | ICD-10-CM | POA: Diagnosis present

## 2023-02-27 DIAGNOSIS — R29810 Facial weakness: Secondary | ICD-10-CM | POA: Diagnosis not present

## 2023-02-27 DIAGNOSIS — R42 Dizziness and giddiness: Secondary | ICD-10-CM

## 2023-02-27 DIAGNOSIS — Z1152 Encounter for screening for COVID-19: Secondary | ICD-10-CM

## 2023-02-27 DIAGNOSIS — E785 Hyperlipidemia, unspecified: Secondary | ICD-10-CM | POA: Diagnosis present

## 2023-02-27 DIAGNOSIS — R066 Hiccough: Secondary | ICD-10-CM | POA: Diagnosis not present

## 2023-02-27 DIAGNOSIS — I129 Hypertensive chronic kidney disease with stage 1 through stage 4 chronic kidney disease, or unspecified chronic kidney disease: Secondary | ICD-10-CM | POA: Diagnosis present

## 2023-02-27 DIAGNOSIS — Z886 Allergy status to analgesic agent status: Secondary | ICD-10-CM | POA: Diagnosis not present

## 2023-02-27 DIAGNOSIS — E1043 Type 1 diabetes mellitus with diabetic autonomic (poly)neuropathy: Secondary | ICD-10-CM | POA: Diagnosis present

## 2023-02-27 DIAGNOSIS — I639 Cerebral infarction, unspecified: Secondary | ICD-10-CM

## 2023-02-27 DIAGNOSIS — K5901 Slow transit constipation: Secondary | ICD-10-CM | POA: Diagnosis not present

## 2023-02-27 DIAGNOSIS — Z7982 Long term (current) use of aspirin: Secondary | ICD-10-CM

## 2023-02-27 DIAGNOSIS — I951 Orthostatic hypotension: Secondary | ICD-10-CM | POA: Diagnosis not present

## 2023-02-27 DIAGNOSIS — N1831 Chronic kidney disease, stage 3a: Secondary | ICD-10-CM | POA: Diagnosis present

## 2023-02-27 DIAGNOSIS — G4733 Obstructive sleep apnea (adult) (pediatric): Secondary | ICD-10-CM | POA: Diagnosis present

## 2023-02-27 DIAGNOSIS — E1022 Type 1 diabetes mellitus with diabetic chronic kidney disease: Secondary | ICD-10-CM | POA: Diagnosis present

## 2023-02-27 DIAGNOSIS — R29700 NIHSS score 0: Secondary | ICD-10-CM | POA: Diagnosis not present

## 2023-02-27 DIAGNOSIS — R1310 Dysphagia, unspecified: Secondary | ICD-10-CM | POA: Diagnosis not present

## 2023-02-27 DIAGNOSIS — I10 Essential (primary) hypertension: Secondary | ICD-10-CM | POA: Diagnosis present

## 2023-02-27 DIAGNOSIS — I6389 Other cerebral infarction: Secondary | ICD-10-CM | POA: Diagnosis not present

## 2023-02-27 DIAGNOSIS — E1029 Type 1 diabetes mellitus with other diabetic kidney complication: Secondary | ICD-10-CM | POA: Diagnosis present

## 2023-02-27 DIAGNOSIS — N183 Chronic kidney disease, stage 3 unspecified: Secondary | ICD-10-CM

## 2023-02-27 DIAGNOSIS — R200 Anesthesia of skin: Secondary | ICD-10-CM | POA: Diagnosis not present

## 2023-02-27 DIAGNOSIS — I160 Hypertensive urgency: Secondary | ICD-10-CM | POA: Diagnosis present

## 2023-02-27 DIAGNOSIS — I152 Hypertension secondary to endocrine disorders: Secondary | ICD-10-CM | POA: Diagnosis not present

## 2023-02-27 DIAGNOSIS — Z79899 Other long term (current) drug therapy: Secondary | ICD-10-CM

## 2023-02-27 DIAGNOSIS — Z9181 History of falling: Secondary | ICD-10-CM

## 2023-02-27 DIAGNOSIS — E669 Obesity, unspecified: Secondary | ICD-10-CM | POA: Diagnosis not present

## 2023-02-27 HISTORY — DX: Chronic kidney disease, stage 3a: N18.31

## 2023-02-27 LAB — CBG MONITORING, ED: Glucose-Capillary: 329 mg/dL — ABNORMAL HIGH (ref 70–99)

## 2023-02-27 LAB — CBC WITH DIFFERENTIAL/PLATELET
Abs Immature Granulocytes: 0.02 10*3/uL (ref 0.00–0.07)
Basophils Absolute: 0 10*3/uL (ref 0.0–0.1)
Basophils Relative: 0 %
Eosinophils Absolute: 0.2 10*3/uL (ref 0.0–0.5)
Eosinophils Relative: 2 %
HCT: 43 % (ref 39.0–52.0)
Hemoglobin: 14.6 g/dL (ref 13.0–17.0)
Immature Granulocytes: 0 %
Lymphocytes Relative: 36 %
Lymphs Abs: 2.7 10*3/uL (ref 0.7–4.0)
MCH: 26.9 pg (ref 26.0–34.0)
MCHC: 34 g/dL (ref 30.0–36.0)
MCV: 79.2 fL — ABNORMAL LOW (ref 80.0–100.0)
Monocytes Absolute: 0.5 10*3/uL (ref 0.1–1.0)
Monocytes Relative: 6 %
Neutro Abs: 4.3 10*3/uL (ref 1.7–7.7)
Neutrophils Relative %: 56 %
Platelets: 420 10*3/uL — ABNORMAL HIGH (ref 150–400)
RBC: 5.43 MIL/uL (ref 4.22–5.81)
RDW: 13.3 % (ref 11.5–15.5)
WBC: 7.7 10*3/uL (ref 4.0–10.5)
nRBC: 0 % (ref 0.0–0.2)

## 2023-02-27 LAB — URINALYSIS, ROUTINE W REFLEX MICROSCOPIC
Bilirubin Urine: NEGATIVE
Glucose, UA: >=500 mg/dL — AB
Ketones, ur: NEGATIVE mg/dL
Leukocytes,Ua: NEGATIVE
Nitrite: NEGATIVE
Protein, ur: >=300 mg/dL — AB
Specific Gravity, Urine: 1.02 (ref 1.005–1.030)
pH: 6.5 (ref 5.0–8.0)

## 2023-02-27 LAB — COMPREHENSIVE METABOLIC PANEL
ALT: 26 U/L (ref 0–44)
AST: 28 U/L (ref 15–41)
Albumin: 3 g/dL — ABNORMAL LOW (ref 3.5–5.0)
Alkaline Phosphatase: 84 U/L (ref 38–126)
Anion gap: 9 (ref 5–15)
BUN: 48 mg/dL — ABNORMAL HIGH (ref 6–20)
CO2: 23 mmol/L (ref 22–32)
Calcium: 9.2 mg/dL (ref 8.9–10.3)
Chloride: 103 mmol/L (ref 98–111)
Creatinine, Ser: 3.31 mg/dL — ABNORMAL HIGH (ref 0.61–1.24)
GFR, Estimated: 24 mL/min — ABNORMAL LOW (ref >60)
Glucose, Bld: 241 mg/dL — ABNORMAL HIGH (ref 70–99)
Potassium: 3.8 mmol/L (ref 3.5–5.1)
Sodium: 135 mmol/L (ref 135–145)
Total Bilirubin: 0.4 mg/dL (ref 0.3–1.2)
Total Protein: 7.4 g/dL (ref 6.5–8.1)

## 2023-02-27 LAB — URINALYSIS, MICROSCOPIC (REFLEX)

## 2023-02-27 LAB — RESP PANEL BY RT-PCR (RSV, FLU A&B, COVID)  RVPGX2
Influenza A by PCR: NEGATIVE
Influenza B by PCR: NEGATIVE
Resp Syncytial Virus by PCR: NEGATIVE
SARS Coronavirus 2 by RT PCR: NEGATIVE

## 2023-02-27 LAB — GLUCOSE, CAPILLARY: Glucose-Capillary: 309 mg/dL — ABNORMAL HIGH (ref 70–99)

## 2023-02-27 MED ORDER — LORAZEPAM 2 MG/ML IJ SOLN
0.5000 mg | Freq: Four times a day (QID) | INTRAMUSCULAR | Status: DC | PRN
  Administered 2023-02-28: 0.5 mg via INTRAVENOUS
  Filled 2023-02-27: qty 1

## 2023-02-27 MED ORDER — INSULIN ASPART 100 UNIT/ML IJ SOLN
0.0000 [IU] | Freq: Three times a day (TID) | INTRAMUSCULAR | Status: DC
  Administered 2023-02-28: 2 [IU] via SUBCUTANEOUS
  Administered 2023-02-28: 3 [IU] via SUBCUTANEOUS
  Administered 2023-02-28: 5 [IU] via SUBCUTANEOUS
  Administered 2023-03-01: 9 [IU] via SUBCUTANEOUS
  Administered 2023-03-01: 7 [IU] via SUBCUTANEOUS
  Administered 2023-03-01: 9 [IU] via SUBCUTANEOUS
  Administered 2023-03-02: 3 [IU] via SUBCUTANEOUS
  Administered 2023-03-02: 5 [IU] via SUBCUTANEOUS
  Administered 2023-03-02: 7 [IU] via SUBCUTANEOUS
  Administered 2023-03-03 (×2): 3 [IU] via SUBCUTANEOUS
  Administered 2023-03-03: 5 [IU] via SUBCUTANEOUS
  Administered 2023-03-04 (×3): 3 [IU] via SUBCUTANEOUS
  Administered 2023-03-05: 2 [IU] via SUBCUTANEOUS
  Administered 2023-03-05 (×2): 3 [IU] via SUBCUTANEOUS
  Administered 2023-03-06 (×3): 2 [IU] via SUBCUTANEOUS
  Administered 2023-03-07: 5 [IU] via SUBCUTANEOUS
  Administered 2023-03-07: 3 [IU] via SUBCUTANEOUS
  Administered 2023-03-08: 2 [IU] via SUBCUTANEOUS
  Administered 2023-03-08: 1 [IU] via SUBCUTANEOUS

## 2023-02-27 MED ORDER — ACETAMINOPHEN 325 MG PO TABS: 650.0000 mg | ORAL_TABLET | Freq: Four times a day (QID) | ORAL | Status: DC | PRN

## 2023-02-27 MED ORDER — ACETAMINOPHEN 650 MG RE SUPP: 650.0000 mg | Freq: Four times a day (QID) | RECTAL | Status: DC | PRN

## 2023-02-27 MED ORDER — LACTATED RINGERS IV SOLN: INTRAVENOUS | Status: AC

## 2023-02-27 MED ORDER — ACETAMINOPHEN 325 MG PO TABS
650.0000 mg | ORAL_TABLET | Freq: Once | ORAL | Status: AC
  Administered 2023-02-27: 650 mg via ORAL
  Filled 2023-02-27: qty 2

## 2023-02-27 MED ORDER — HYDRALAZINE HCL 20 MG/ML IJ SOLN
10.0000 mg | INTRAMUSCULAR | Status: DC | PRN
  Administered 2023-02-28: 10 mg via INTRAVENOUS
  Filled 2023-02-27: qty 1

## 2023-02-27 MED ORDER — MELATONIN 3 MG PO TABS
3.0000 mg | ORAL_TABLET | Freq: Every evening | ORAL | Status: DC | PRN
  Administered 2023-02-28 – 2023-03-04 (×3): 3 mg via ORAL
  Filled 2023-02-27 (×3): qty 1

## 2023-02-27 MED ORDER — SODIUM CHLORIDE 0.9 % IV BOLUS
1000.0000 mL | Freq: Once | INTRAVENOUS | Status: AC
  Administered 2023-02-27: 1000 mL via INTRAVENOUS

## 2023-02-27 MED ORDER — INSULIN ASPART 100 UNIT/ML IJ SOLN
0.0000 [IU] | Freq: Every day | INTRAMUSCULAR | Status: DC
  Administered 2023-02-27: 4 [IU] via SUBCUTANEOUS
  Administered 2023-02-28: 2 [IU] via SUBCUTANEOUS
  Administered 2023-03-01: 3 [IU] via SUBCUTANEOUS
  Administered 2023-03-02: 2 [IU] via SUBCUTANEOUS
  Administered 2023-03-03: 3 [IU] via SUBCUTANEOUS
  Administered 2023-03-04 – 2023-03-06 (×3): 2 [IU] via SUBCUTANEOUS

## 2023-02-27 MED ORDER — ONDANSETRON 4 MG PO TBDP
8.0000 mg | ORAL_TABLET | Freq: Once | ORAL | Status: AC
  Administered 2023-02-27: 8 mg via ORAL
  Filled 2023-02-27: qty 2

## 2023-02-27 MED ORDER — ONDANSETRON HCL 4 MG/2ML IJ SOLN
4.0000 mg | Freq: Four times a day (QID) | INTRAMUSCULAR | Status: DC | PRN
  Administered 2023-02-28 – 2023-03-06 (×8): 4 mg via INTRAVENOUS
  Filled 2023-02-27 (×9): qty 2

## 2023-02-27 NOTE — H&P (Signed)
History and Physical      [name] DOB: 04-20-1986 DOA: 02/27/2023; DOS: 02/27/2023  PCP: Cherie Ouch, FNP *** Patient coming from: home ***  I have personally briefly reviewed patient's old medical records in [facility]  Chief Complaint: ***  HPI: [name] is a 37 y.o. male with medical history significant for *** who is admitted to [facility] on 02/27/2023 with *** after presenting from home*** to [facility] ED complaining of ***.    ***       ***   ED Course:  Vital signs in the ED were notable for the following: ***  Labs were notable for the following: ***  Per my interpretation, EKG in ED demonstrated the following:  ***  Imaging in the ED, per corresponding formal radiology read, was notable for the following:  ***  While in the ED, the following were administered: ***  Subsequently, the patient was admitted  ***  ***red    Review of Systems: As per HPI otherwise 10 point review of systems negative.   Past Medical History:  Diagnosis Date   Diabetes mellitus    Hypertension     Past Surgical History:  Procedure Laterality Date   INCISION AND DRAINAGE ABSCESS Right 04/21/2021   Procedure: INCISION, DRAINAGE, DEBRIDEMENT OF RIGHT THIGH/GROIN INFECTION;  Surgeon: Abigail Miyamoto, MD;  Location: [facility] ORS;  Service: General;  Laterality: Right;   IRRIGATION AND DEBRIDEMENT ABSCESS Right 04/23/2021   Procedure: IRRIGATION AND DEBRIDEMENT RIGHT GROIN WOUND; RIGHT THIGH DRESSING CHANGE;  Surgeon: Abigail Miyamoto, MD;  Location: [facility] ORS;  Service: General;  Laterality: Right;   IRRIGATION AND DEBRIDEMENT ABSCESS Right 04/26/2021   Procedure: reexploration and debridement of soft tissue infection right groin;  Surgeon: Emelia Loron, MD;  Location: [facility] ORS;  Service: General;  Laterality: Right;    Social History:  reports that he has never smoked. He has never used smokeless tobacco. He reports that he does not  currently use alcohol. He reports that he does not use drugs.   Allergies  Allergen Reactions   Ibuprofen Swelling    Pt reports taking after reported allergy w/ no reactions    History reviewed. No pertinent family history.  Family history reviewed and not pertinent ***   Prior to Admission medications   Medication Sig Start Date End Date Taking? Authorizing Provider  acetaminophen (TYLENOL) 325 MG tablet Take 650 mg by mouth every 6 (six) hours as needed for mild pain or headache.    [provider]  amLODipine (NORVASC) 10 MG tablet Take 1 tablet (10 mg total) by mouth daily. 05/01/21 05/31/21  Eric Form, PA-C  amoxicillin-clavulanate (AUGMENTIN) 875-125 MG tablet Take 1 tablet by mouth every 12 (twelve) hours. 08/20/22   Melene Plan, DO  atorvastatin (LIPITOR) 80 MG tablet Take 1 tablet (80 mg total) by mouth daily. 05/01/21   Eric Form, PA-C  benazepril (LOTENSIN) 20 MG tablet Take 20 mg by mouth daily. 05/22/20   [provider]  benzonatate (TESSALON) 100 MG capsule Take 1 capsule (100 mg total) by mouth every 8 (eight) hours. 08/20/22   Melene Plan, DO  hydrALAZINE (APRESOLINE) 100 MG tablet Take 1 tablet (100 mg total) by mouth every 8 (eight) hours. 05/01/21 05/31/21  Eric Form, PA-C  insulin aspart protamine - aspart (NOVOLOG 70/30 FLEXPEN) (70-30) 100 UNIT/ML FlexPen Inject 30 Units into the skin 2 (two) times daily with a meal. 05/01/21   Eric Form,  PA-C  Insulin Pen Needle 31G X 5 MM MISC 1 Device by Does not apply route 2 (two) times daily. For use with insulin pens 05/01/21   Eric Form, PA-C  ondansetron (ZOFRAN-ODT) 4 MG disintegrating tablet 4mg  ODT q4 hours prn nausea/vomit 08/20/22   Melene Plan, DO  Saccharomyces boulardii (PROBIOTIC) 250 MG CAPS Take 250 mg by mouth daily at 12 noon. 05/01/21   Eric Form, PA-C  sodium chloride (OCEAN) 0.65 % nasal spray Place 1 spray into the nose daily as needed for  congestion.    [provider]  sucralfate (CARAFATE) 1 g tablet Take 1 tablet (1 g total) by mouth 4 (four) times daily -  with meals and at bedtime. 04/07/17   Hayden Rasmussen, NP  Vitamin D, Ergocalciferol, (DRISDOL) 1.25 MG (50000 UNIT) CAPS capsule Take 50,000 Units by mouth 2 (two) times a week. Monday, wed. 03/02/21   [provider]     Objective    Physical Exam: Vitals:   02/27/23 2052 02/27/23 2308 02/27/23 2314 02/27/23 2321  BP: (!) 186/111   (!) 193/124  Pulse: 100 90  96  Resp: 20   19  Temp: 98.2 F (36.8 C)   98.4 F (36.9 C)  TempSrc: Oral   Oral  SpO2: 99% 100%  99%  Weight:    114.2 kg  Height:   5\' 10"  (1.778 m)     General: appears to be stated age; alert, oriented Skin: warm, dry, no rash Head:  AT/[place] Mouth:  Oral mucosa membranes appear moist, normal dentition Neck: supple; trachea midline Heart:  RRR; did not appreciate any M/R/G Lungs: CTAB, did not appreciate any wheezes, rales, or rhonchi Abdomen: + BS; soft, ND, NT Vascular: 2+ pedal pulses b/l; 2+ radial pulses b/l Extremities: no peripheral edema, no muscle wasting Neuro: strength and sensation intact in upper and lower extremities b/l ***   *** Neuro: 5/5 strength of the proximal and distal flexors and extensors of the upper and lower extremities bilaterally; sensation intact in upper and lower extremities b/l; cranial nerves II through XII grossly intact; no pronator drift; no evidence suggestive of slurred speech, dysarthria, or facial droop; Normal muscle tone. No tremors.  *** Neuro: In the setting of the patient's current mental status and associated inability to follow instructions, unable to perform full neurologic exam at this time.  As such, assessment of strength, sensation, and cranial nerves is limited at this time. Patient noted to spontaneously move all 4 extremities. No tremors.  ***    Labs on Admission: I have personally reviewed following labs and imaging  studies  CBC: Recent Labs  Lab 02/27/23 1851  WBC 7.7  NEUTROABS 4.3  HGB 14.6  HCT 43.0  MCV 79.2*  PLT 420*   Basic Metabolic Panel: Recent Labs  Lab 02/27/23 1851  NA 135  K 3.8  CL 103  CO2 23  GLUCOSE 241*  BUN 48*  CREATININE 3.31*  CALCIUM 9.2   GFR: Estimated Creatinine Clearance: 38.7 mL/min (A) (by C-G formula based on SCr of 3.31 mg/dL (H)). Liver Function Tests: Recent Labs  Lab 02/27/23 1851  AST 28  ALT 26  ALKPHOS 84  BILITOT 0.4  PROT 7.4  ALBUMIN 3.0*   No results for input(s): "LIPASE", "AMYLASE" in the last 168 hours. No results for input(s): "AMMONIA" in the last 168 hours. Coagulation Profile: No results for input(s): "INR", "PROTIME" in the last 168 hours. Cardiac Enzymes: No results for input(s): "CKTOTAL", "  CKMB", "CKMBINDEX", "TROPONINI" in the last 168 hours. BNP (last 3 results) No results for input(s): "PROBNP" in the last 8760 hours. HbA1C: No results for input(s): "HGBA1C" in the last 72 hours. CBG: Recent Labs  Lab 02/27/23 1722 02/27/23 2328  GLUCAP 329* 309*   Lipid Profile: No results for input(s): "CHOL", "HDL", "LDLCALC", "TRIG", "CHOLHDL", "LDLDIRECT" in the last 72 hours. Thyroid Function Tests: No results for input(s): "TSH", "T4TOTAL", "FREET4", "T3FREE", "THYROIDAB" in the last 72 hours. Anemia Panel: No results for input(s): "VITAMINB12", "FOLATE", "FERRITIN", "TIBC", "IRON", "RETICCTPCT" in the last 72 hours. Urine analysis:    Component Value Date/Time   COLORURINE YELLOW 02/27/2023 1851   APPEARANCEUR CLEAR 02/27/2023 1851   LABSPEC 1.020 02/27/2023 1851   PHURINE 6.5 02/27/2023 1851   GLUCOSEU >=500 (A) 02/27/2023 1851   HGBUR SMALL (A) 02/27/2023 1851   BILIRUBINUR NEGATIVE 02/27/2023 1851   KETONESUR NEGATIVE 02/27/2023 1851   PROTEINUR >=300 (A) 02/27/2023 1851   UROBILINOGEN 0.2 02/14/2008 1634   NITRITE NEGATIVE 02/27/2023 1851   LEUKOCYTESUR NEGATIVE 02/27/2023 1851    Radiological Exams  on Admission: No results found.    Assessment/Plan   Principal Problem:   Acute renal failure superimposed on stage 3 chronic kidney disease, unspecified acute renal failure type, unspecified whether stage 3a or 3b CKD (HCC)   ***            ***                ***                 ***               ***               ***               ***                ***               ***               ***               ***               ***              ***          ***  DVT prophylaxis: SCD's ***  Code Status: Full code*** Family Communication: none*** Disposition Plan: Per Rounding Team Consults called: none***;  Admission status: ***     I SPENT GREATER THAN 75 *** MINUTES IN CLINICAL CARE TIME/MEDICAL DECISION-MAKING IN COMPLETING THIS ADMISSION.      Chaney Born Minal Stuller DO [facility]  From 7PM - 7AM   02/27/2023, 11:46 PM   ***

## 2023-02-27 NOTE — ED Provider Notes (Signed)
[facility] Provider Note   CSN: [id number] Arrival date & time: 02/27/23  1708     History  Chief Complaint  Patient presents with   Dizziness    Bradley Davis is a 37 y.o. male.  Bradley Davis is a 37 yo M with PMH type 1 DM who presents to [facility] with complain of N/V for one day. He is also reporting intermittent positional dizziness and R sided headache. These symptoms started last night after a home-made dinner that was greasy. He has vomited three times, one time today, and there was no blood. He is unable to keep food down but can keep some liquids down. He states that his mouth feels very dry. He has not missed any insulin, including today, but he has not taken his oral medicines including for blood pressure. He denies chest pain, dyspnea, abdominal pain, diarrhea, polyuria.   Dizziness Associated symptoms: headaches, nausea and vomiting   Associated symptoms: no chest pain, no diarrhea, no palpitations, no shortness of breath and no weakness        Home Medications Prior to Admission medications   Medication Sig Start Date End Date Taking? Authorizing Provider  acetaminophen (TYLENOL) 325 MG tablet Take 650 mg by mouth every 6 (six) hours as needed for mild pain or headache.    [provider]  amLODipine (NORVASC) 10 MG tablet Take 1 tablet (10 mg total) by mouth daily. 05/01/21 05/31/21  Eric Form, PA-C  amoxicillin-clavulanate (AUGMENTIN) 875-125 MG tablet Take 1 tablet by mouth every 12 (twelve) hours. 08/20/22   Melene Plan, DO  atorvastatin (LIPITOR) 80 MG tablet Take 1 tablet (80 mg total) by mouth daily. 05/01/21   Eric Form, PA-C  benazepril (LOTENSIN) 20 MG tablet Take 20 mg by mouth daily. 05/22/20   [provider]  benzonatate (TESSALON) 100 MG capsule Take 1 capsule (100 mg total) by mouth every 8 (eight) hours. 08/20/22   Melene Plan, DO  hydrALAZINE (APRESOLINE) 100 MG  tablet Take 1 tablet (100 mg total) by mouth every 8 (eight) hours. 05/01/21 05/31/21  Eric Form, PA-C  insulin aspart protamine - aspart (NOVOLOG 70/30 FLEXPEN) (70-30) 100 UNIT/ML FlexPen Inject 30 Units into the skin 2 (two) times daily with a meal. 05/01/21   Eric Form, PA-C  Insulin Pen Needle 31G X 5 MM MISC 1 Device by Does not apply route 2 (two) times daily. For use with insulin pens 05/01/21   Eric Form, PA-C  ondansetron (ZOFRAN-ODT) 4 MG disintegrating tablet 4mg  ODT q4 hours prn nausea/vomit 08/20/22   Melene Plan, DO  Saccharomyces boulardii (PROBIOTIC) 250 MG CAPS Take 250 mg by mouth daily at 12 noon. 05/01/21   Eric Form, PA-C  sodium chloride (OCEAN) 0.65 % nasal spray Place 1 spray into the nose daily as needed for congestion.    [provider]  sucralfate (CARAFATE) 1 g tablet Take 1 tablet (1 g total) by mouth 4 (four) times daily -  with meals and at bedtime. 04/07/17   Hayden Rasmussen, NP  Vitamin D, Ergocalciferol, (DRISDOL) 1.25 MG (50000 UNIT) CAPS capsule Take 50,000 Units by mouth 2 (two) times a week. Monday, wed. 03/02/21   [provider]      Allergies    Ibuprofen    Review of Systems   Review of Systems  Constitutional:  Negative for chills, diaphoresis, fatigue and fever.  Respiratory:  Negative for  cough, shortness of breath and stridor.   Cardiovascular:  Negative for chest pain, palpitations and leg swelling.  Gastrointestinal:  Positive for nausea and vomiting. Negative for abdominal distention, abdominal pain, constipation and diarrhea.  Endocrine: Negative for polydipsia and polyuria.  Genitourinary:  Negative for difficulty urinating, dysuria, flank pain and frequency.  Musculoskeletal:  Negative for arthralgias and myalgias.  Neurological:  Positive for dizziness and headaches. Negative for weakness.  Psychiatric/Behavioral:  Negative for confusion.     Physical Exam Updated Vital Signs BP (!) 186/111  (BP Location: Right Arm)   Pulse 100   Temp 98.2 F (36.8 C) (Oral)   Resp 20   Ht 5\' 10"  (1.778 m)   Wt 104.3 kg   SpO2 99%   BMI 33.00 kg/m  Physical Exam Constitutional:      General: He is not in acute distress.    Appearance: Normal appearance. He is not ill-appearing.  HENT:     Head: Normocephalic and atraumatic.     Nose: Nose normal.     Mouth/Throat:     Mouth: Mucous membranes are dry.  Eyes:     Extraocular Movements: Extraocular movements intact.     Pupils: Pupils are equal, round, and reactive to light.  Cardiovascular:     Rate and Rhythm: Normal rate and regular rhythm.     Pulses: Normal pulses.     Heart sounds: Normal heart sounds.  Pulmonary:     Effort: Pulmonary effort is normal.     Breath sounds: Normal breath sounds.  Abdominal:     General: Abdomen is flat. Bowel sounds are normal. There is no distension.     Palpations: Abdomen is soft.     Tenderness: There is no abdominal tenderness. There is no guarding.  Skin:    General: Skin is warm and dry.     Capillary Refill: Capillary refill takes 2 to 3 seconds.  Neurological:     General: No focal deficit present.     Mental Status: He is alert and oriented to person, place, and time.  Psychiatric:        Mood and Affect: Mood normal.     ED Results / Procedures / Treatments   Labs (all labs ordered are listed, but only abnormal results are displayed) Labs Reviewed  CBC WITH DIFFERENTIAL/PLATELET - Abnormal; Notable for the following components:      Result Value   MCV 79.2 (*)    Platelets 420 (*)    All other components within normal limits  COMPREHENSIVE METABOLIC PANEL - Abnormal; Notable for the following components:   Glucose, Bld 241 (*)    BUN 48 (*)    Creatinine, Ser 3.31 (*)    Albumin 3.0 (*)    GFR, Estimated 24 (*)    All other components within normal limits  URINALYSIS, ROUTINE W REFLEX MICROSCOPIC - Abnormal; Notable for the following components:   Glucose, UA >=500  (*)    Hgb urine dipstick SMALL (*)    Protein, ur >=300 (*)    All other components within normal limits  URINALYSIS, MICROSCOPIC (REFLEX) - Abnormal; Notable for the following components:   Bacteria, UA RARE (*)    All other components within normal limits  CBG MONITORING, ED - Abnormal; Notable for the following components:   Glucose-Capillary 329 (*)    All other components within normal limits  RESP PANEL BY RT-PCR (RSV, FLU A&B, COVID)  [id number]    EKG None  Radiology No results  found.  Procedures Procedures    Medications Ordered in ED Medications  sodium chloride 0.9 % bolus 1,000 mL (0 mLs Intravenous Stopped 02/27/23 2050)  ondansetron (ZOFRAN-ODT) disintegrating tablet 8 mg (8 mg Oral Given 02/27/23 1844)  acetaminophen (TYLENOL) tablet 650 mg (650 mg Oral Given 02/27/23 1844)  sodium chloride 0.9 % bolus 1,000 mL (0 mLs Intravenous Stopped 02/27/23 2159)  acetaminophen (TYLENOL) tablet 650 mg (650 mg Oral Given 02/27/23 2207)    ED Course/ Medical Decision Making/ A&P Clinical Course as of 02/27/23 2217  Wed Feb 27, 2023  1831 Stable (Resident) CCX of dizziness, n/v and LH. T1DM  AP Nonspecific NV. Less likely DKA. Evaluating with labs and empirically treating.  Painless so biliary less likely [CC]  2039 Admit for AKI. [CC]    Clinical Course User Index [CC] Glyn Ade, MD                                 Medical Decision Making 37 yo M with T1DM presenting with N/V, headaches and dizziness. He is compliant with his insulin. Initial glucose 329. He appears dehydrated on exam. Imitated symptom management with Zofran and Tylenol. Fluids replenished with IV. He is not acutely ill and do not suspect DKA, and lab results confirm without acute electrolyte derangements or anion gap. Although his GI illness was subsequent to a fatty meal, do not currently suspect biliary cause as he is without pain without a history of colic. Suspect gastroenteritis as cause of  acute illness. He has developed an AKI in this context. Cr 3.6 which is up from a baseline of 1.5. Suspect prerenal. He has now received 2L bolus in ED. Discussed case with hospital medicine, plan to admit for AKI.  Amount and/or Complexity of Data Reviewed Labs: ordered.    Details: CBC unremarkable. Respiratory panel unremarkable. Metabolic panel reveals AKI, but no AG or electrolyte derangement. UA not infectious, no ketosis.  Risk OTC drugs. Prescription drug management. Decision regarding hospitalization.         Final Clinical Impression(s) / ED Diagnoses Final diagnoses:  AKI (acute kidney injury) (HCC)  Dizziness  Dehydration  Nausea and vomiting, unspecified vomiting type    Rx / DC Orders ED Discharge Orders     None         Katheran James, DO 02/27/23 2217    Glyn Ade, MD 02/27/23 [phone]

## 2023-02-27 NOTE — Progress Notes (Signed)
Plan of Care Note for accepted transfer   Patient: Bradley Davis MRN: [id number]   DOA: 02/27/2023  Facility requesting transfer: [facility]   Requesting Provider: Dr. Doran Durand   Reason for transfer: Intractable N/V, AKI   Facility course: 37 year old gentleman with type 1 diabetes, hypertension, and CKD stage III who presents with nausea, vomiting, and lightheadedness.  He is found to have creatinine of 3.31 (1.94 one year ago).  He was given 2 L of NS and Zofran in the ED but is still unable to tolerate anything by mouth due to N/V.  Plan of care: The patient is accepted for admission to Med-surg  unit, at [facility].   Author: Briscoe Deutscher, MD 02/27/2023  Check [email or web address] for on-call coverage.  Nursing staff, Please call [facility] Admits & Consults System-Wide number on Amion as soon as patient's arrival, so appropriate admitting provider can evaluate the pt.

## 2023-02-27 NOTE — Plan of Care (Signed)
  Problem: Education: Goal: Knowledge of General Education information will improve Description Including pain rating scale, medication(s)/side effects and non-pharmacologic comfort measures Outcome: Progressing   

## 2023-02-27 NOTE — ED Notes (Signed)
[facility] called for Transport @ 21:56 No ETA.Marland KitchenMarland Kitchen

## 2023-02-27 NOTE — ED Notes (Signed)
EDP at bedside  

## 2023-02-27 NOTE — ED Triage Notes (Signed)
Pt reports NV, dizziness since yesterday; hx Type 1 DM

## 2023-02-27 NOTE — Progress Notes (Signed)
New Admission Note:  Arrival Method: [facility] Mental Orientation: Alert and oriented x 4 Telemetry: N/A Assessment: Completed Skin: Warm and dry IV: NSL Pain: Denies  Tubes:  N/A Safety Measures: Safety Fall Prevention Plan initiated.  Admission: Completed 5 M  Orientation: Patient has been orientated to the room, unit and the staff. Welcome booklet given.  Family: None  Orders have been reviewed and implemented. Will continue to monitor the patient. Call light has been placed within reach and bed alarm has been activated.   Guilford Shi BSN, RN  Phone Number: [phone]

## 2023-02-27 NOTE — ED Notes (Signed)
Pt reported decrease in dizziness & headache until he ambulated to restroom.  Now endorses left sided headache. EDP made aware.

## 2023-02-28 ENCOUNTER — Inpatient Hospital Stay (HOSPITAL_COMMUNITY): Payer: Medicaid Other

## 2023-02-28 DIAGNOSIS — N179 Acute kidney failure, unspecified: Secondary | ICD-10-CM | POA: Diagnosis not present

## 2023-02-28 DIAGNOSIS — N1831 Chronic kidney disease, stage 3a: Secondary | ICD-10-CM | POA: Diagnosis not present

## 2023-02-28 DIAGNOSIS — R112 Nausea with vomiting, unspecified: Secondary | ICD-10-CM | POA: Diagnosis present

## 2023-02-28 LAB — CBC WITH DIFFERENTIAL/PLATELET
Abs Immature Granulocytes: 0.02 10*3/uL (ref 0.00–0.07)
Basophils Absolute: 0 10*3/uL (ref 0.0–0.1)
Basophils Relative: 1 %
Eosinophils Absolute: 0.3 10*3/uL (ref 0.0–0.5)
Eosinophils Relative: 3 %
HCT: 37.1 % — ABNORMAL LOW (ref 39.0–52.0)
Hemoglobin: 12.5 g/dL — ABNORMAL LOW (ref 13.0–17.0)
Immature Granulocytes: 0 %
Lymphocytes Relative: 39 %
Lymphs Abs: 3.2 10*3/uL (ref 0.7–4.0)
MCH: 26.7 pg (ref 26.0–34.0)
MCHC: 33.7 g/dL (ref 30.0–36.0)
MCV: 79.1 fL — ABNORMAL LOW (ref 80.0–100.0)
Monocytes Absolute: 0.6 10*3/uL (ref 0.1–1.0)
Monocytes Relative: 7 %
Neutro Abs: 4 10*3/uL (ref 1.7–7.7)
Neutrophils Relative %: 50 %
Platelets: 346 10*3/uL (ref 150–400)
RBC: 4.69 MIL/uL (ref 4.22–5.81)
RDW: 13.2 % (ref 11.5–15.5)
WBC: 8.1 10*3/uL (ref 4.0–10.5)
nRBC: 0 % (ref 0.0–0.2)

## 2023-02-28 LAB — COMPREHENSIVE METABOLIC PANEL
ALT: 22 U/L (ref 0–44)
AST: 21 U/L (ref 15–41)
Albumin: 2.2 g/dL — ABNORMAL LOW (ref 3.5–5.0)
Alkaline Phosphatase: 67 U/L (ref 38–126)
Anion gap: 9 (ref 5–15)
BUN: 39 mg/dL — ABNORMAL HIGH (ref 6–20)
CO2: 20 mmol/L — ABNORMAL LOW (ref 22–32)
Calcium: 8.2 mg/dL — ABNORMAL LOW (ref 8.9–10.3)
Chloride: 103 mmol/L (ref 98–111)
Creatinine, Ser: 3.09 mg/dL — ABNORMAL HIGH (ref 0.61–1.24)
GFR, Estimated: 26 mL/min — ABNORMAL LOW (ref >60)
Glucose, Bld: 247 mg/dL — ABNORMAL HIGH (ref 70–99)
Potassium: 3.5 mmol/L (ref 3.5–5.1)
Sodium: 132 mmol/L — ABNORMAL LOW (ref 135–145)
Total Bilirubin: 0.7 mg/dL (ref 0.3–1.2)
Total Protein: 5.5 g/dL — ABNORMAL LOW (ref 6.5–8.1)

## 2023-02-28 LAB — LIPASE, BLOOD: Lipase: 25 U/L (ref 11–51)

## 2023-02-28 LAB — GLUCOSE, CAPILLARY
Glucose-Capillary: 185 mg/dL — ABNORMAL HIGH (ref 70–99)
Glucose-Capillary: 236 mg/dL — ABNORMAL HIGH (ref 70–99)
Glucose-Capillary: 272 mg/dL — ABNORMAL HIGH (ref 70–99)
Glucose-Capillary: 244 mg/dL — ABNORMAL HIGH (ref 70–99)

## 2023-02-28 LAB — CK: Total CK: 503 U/L — ABNORMAL HIGH (ref 49–397)

## 2023-02-28 LAB — HEMOGLOBIN A1C
Hgb A1c MFr Bld: 13.8 % — ABNORMAL HIGH (ref 4.8–5.6)
Mean Plasma Glucose: 349.36 mg/dL

## 2023-02-28 LAB — TSH: TSH: 0.871 u[IU]/mL (ref 0.350–4.500)

## 2023-02-28 LAB — MAGNESIUM: Magnesium: 1.9 mg/dL (ref 1.7–2.4)

## 2023-02-28 MED ORDER — ATORVASTATIN CALCIUM 80 MG PO TABS
80.0000 mg | ORAL_TABLET | Freq: Every day | ORAL | Status: DC
  Administered 2023-02-28 – 2023-03-08 (×9): 80 mg via ORAL
  Filled 2023-02-28 (×9): qty 1

## 2023-02-28 MED ORDER — ACETAMINOPHEN 650 MG RE SUPP: 650.0000 mg | Freq: Four times a day (QID) | RECTAL | Status: DC | PRN

## 2023-02-28 MED ORDER — ORAL CARE MOUTH RINSE: 15.0000 mL | OROMUCOSAL | Status: DC | PRN

## 2023-02-28 MED ORDER — SODIUM CHLORIDE 0.9 % IV SOLN
INTRAVENOUS | Status: DC
  Administered 2023-03-01 (×2): 1000 mL via INTRAVENOUS

## 2023-02-28 MED ORDER — HYDRALAZINE HCL 50 MG PO TABS
100.0000 mg | ORAL_TABLET | Freq: Three times a day (TID) | ORAL | Status: DC
  Administered 2023-02-28 – 2023-03-08 (×26): 100 mg via ORAL
  Filled 2023-02-28 (×29): qty 2

## 2023-02-28 MED ORDER — ACETAMINOPHEN 500 MG PO TABS
1000.0000 mg | ORAL_TABLET | Freq: Four times a day (QID) | ORAL | Status: DC | PRN
  Administered 2023-03-01 – 2023-03-07 (×4): 1000 mg via ORAL
  Filled 2023-02-28 (×4): qty 2

## 2023-02-28 MED ORDER — INSULIN DETEMIR 100 UNIT/ML ~~LOC~~ SOLN
7.0000 [IU] | Freq: Two times a day (BID) | SUBCUTANEOUS | Status: DC
  Administered 2023-02-28 (×3): 7 [IU] via SUBCUTANEOUS
  Filled 2023-02-28 (×5): qty 0.07

## 2023-02-28 MED ORDER — AMLODIPINE BESYLATE 10 MG PO TABS
10.0000 mg | ORAL_TABLET | Freq: Every day | ORAL | Status: DC
  Administered 2023-03-01 – 2023-03-08 (×8): 10 mg via ORAL
  Filled 2023-02-28 (×9): qty 1

## 2023-02-28 MED ORDER — LABETALOL HCL 200 MG PO TABS
100.0000 mg | ORAL_TABLET | Freq: Three times a day (TID) | ORAL | Status: DC
  Administered 2023-02-28 – 2023-03-01 (×2): 100 mg via ORAL
  Filled 2023-02-28 (×3): qty 1

## 2023-02-28 MED ORDER — LABETALOL HCL 5 MG/ML IV SOLN
10.0000 mg | INTRAVENOUS | Status: DC | PRN
  Administered 2023-02-28 – 2023-03-01 (×3): 10 mg via INTRAVENOUS
  Filled 2023-02-28 (×3): qty 4

## 2023-02-28 NOTE — Progress Notes (Signed)
PROGRESS NOTE  Bradley Davis  ONG:[id number] DOB: [birth year] DOA: 02/27/2023 PCP: [name], FNP   Brief Narrative: Patient is a 37 year old male with history of diabetes type 1, hypertension, CKD stage IIIa with baseline creatinine of 1.5-1.9 who presented to [facility] with complaint of nausea and vomiting for 2 days, unable to tolerate oral, lightheadedness.  On presentation, he was hypertensive.  Lab work showed creatinine of 3.31.  Patient was admitted for the management of AKI and CKD stage IIIa and was started on IV fluids.  Assessment & Plan:  Principal Problem:   Acute renal failure superimposed on stage 3a chronic kidney disease (HCC) Active Problems:   DM (diabetes mellitus), type 1 with renal complications (HCC)   Essential hypertension   Intractable nausea and vomiting   AKI on CKD stage IIIa: Baseline creatinine ranges from 1.5-1.9.  Presented with complaint of nausea, vomiting, unable to tolerate oral .  Started on IV fluids.  Patient taking benazepril at home.  Currently on hold.  Renal ultrasound was unremarkable for acute changes  Intractable nausea/vomiting: 2 days history of intractable nausea and vomiting.  Continue antiemetics, IV fluids.  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  Currently on full liquid diet.  Lightheadedness: Will check orthostatic vitals in a.m if continues to be lightheaded.  Most like this is from dehydration.  Type 1 diabetes: On 70/30 insulin at home.  Follows with cardiology.  Continue monitor blood sugars.  Continue current insulin regimen.  Diabetes is uncontrolled.  Recent A1c of 13.8.  Will consult diabetic coordinator  Hypertension: Severely hypertensive.  Home amlodipine, hydralazine restarted.  Benazepril on hold.  Started on labetalol  Morbid obesity: BMI 36.1          DVT prophylaxis:SCDs Start: 02/27/23 2341     Code Status: Full Code  Family Communication: None at the bedside  Patient status:  Inpatient  Patient is from : Home  Anticipated discharge to: Home  Estimated DC date: Tomorrow   Consultants: None  Procedures: None  Antimicrobials:  Anti-infectives (From admission, onward)    None       Subjective: Patient seen and examined at bedside today.  He feels better than yesterday but is still nauseous.  Vomited once this morning.  He wants to revert his diet to liquid.  Still having some lightheadedness.  Blood pressure high  Objective: Vitals:   02/27/23 2308 02/27/23 2314 02/27/23 2321 02/28/23 0653  BP:   (!) 193/124 (!) 199/113  Pulse: 90  96 86  Resp:   19 (!) 21  Temp:   98.4 F (36.9 C) 98.7 F (37.1 C)  TempSrc:   Oral   SpO2: 100%  99% 99%  Weight:   114.2 kg   Height:  5\' 10"  (1.778 m)      Intake/Output Summary (Last 24 hours) at 02/28/2023 0806 Last data filed at 02/28/2023 0800 Gross per 24 hour  Intake 3311.93 ml  Output 1000 ml  Net 2311.93 ml   Filed Weights   02/27/23 1719 02/27/23 2321  Weight: 104.3 kg 114.2 kg    Examination:  General exam: Overall comfortable, not in distress, morbidly obese HEENT: PERRL Respiratory system:  no wheezes or crackles  Cardiovascular system: S1 & S2 heard, RRR.  Gastrointestinal system: Abdomen is nondistended, soft and nontender. Central nervous system: Alert and oriented Extremities: No edema, no clubbing ,no cyanosis Skin: No rashes, no ulcers,no icterus     Data Reviewed: I have personally reviewed following labs and  imaging studies  CBC: Recent Labs  Lab 02/27/23 1851 02/28/23 0412  WBC 7.7 8.1  NEUTROABS 4.3 4.0  HGB 14.6 12.5*  HCT 43.0 37.1*  MCV 79.2* 79.1*  PLT 420* 346   Basic Metabolic Panel: Recent Labs  Lab 02/27/23 1851 02/28/23 0412  NA 135 132*  K 3.8 3.5  CL 103 103  CO2 23 20*  GLUCOSE 241* 247*  BUN 48* 39*  CREATININE 3.31* 3.09*  CALCIUM 9.2 8.2*  MG  --  1.9     Recent Results (from the past 240 hour(s))  Resp panel by RT-PCR (RSV, Flu  A&B, Covid) Anterior Nasal Swab     Status: None   Collection Time: 02/27/23  5:22 PM   Specimen: Anterior Nasal Swab  Result Value Ref Range Status   SARS Coronavirus 2 by RT PCR NEGATIVE NEGATIVE Final    Comment: (NOTE) SARS-CoV-2 target nucleic acids are NOT DETECTED.  The SARS-CoV-2 RNA is generally detectable in upper respiratory specimens during the acute phase of infection. The lowest concentration of SARS-CoV-2 viral copies this assay can detect is 138 copies/mL. A negative result does not preclude SARS-Cov-2 infection and should not be used as the sole basis for treatment or other patient management decisions. A negative result may occur with  improper specimen collection/handling, submission of specimen other than nasopharyngeal swab, presence of viral mutation(s) within the areas targeted by this assay, and inadequate number of viral copies(<138 copies/mL). A negative result must be combined with clinical observations, patient history, and epidemiological information. The expected result is Negative.  Fact Sheet for Patients:  BloggerCourse.com  Fact Sheet for Healthcare Providers:  SeriousBroker.it  This test is no t yet approved or cleared by the [place] FDA and  has been authorized for detection and/or diagnosis of SARS-CoV-2 by FDA under an Emergency Use Authorization (EUA). This EUA will remain  in effect (meaning this test can be used) for the duration of the COVID-19 declaration under Section 564(b)(1) of the Act, 21 U.S.C.section 360bbb-3(b)(1), unless the authorization is terminated  or revoked sooner.       Influenza A by PCR NEGATIVE NEGATIVE Final   Influenza B by PCR NEGATIVE NEGATIVE Final    Comment: (NOTE) The Xpert Xpress SARS-CoV-2/FLU/RSV plus assay is intended as an aid in the diagnosis of influenza from Nasopharyngeal swab specimens and should not be used as a sole basis for treatment.  Nasal washings and aspirates are unacceptable for Xpert Xpress SARS-CoV-2/FLU/RSV testing.  Fact Sheet for Patients: BloggerCourse.com  Fact Sheet for Healthcare Providers: SeriousBroker.it  This test is not yet approved or cleared by the [place] FDA and has been authorized for detection and/or diagnosis of SARS-CoV-2 by FDA under an Emergency Use Authorization (EUA). This EUA will remain in effect (meaning this test can be used) for the duration of the COVID-19 declaration under Section 564(b)(1) of the Act, 21 U.S.C. section 360bbb-3(b)(1), unless the authorization is terminated or revoked.     Resp Syncytial Virus by PCR NEGATIVE NEGATIVE Final    Comment: (NOTE) Fact Sheet for Patients: BloggerCourse.com  Fact Sheet for Healthcare Providers: SeriousBroker.it  This test is not yet approved or cleared by the [place] FDA and has been authorized for detection and/or diagnosis of SARS-CoV-2 by FDA under an Emergency Use Authorization (EUA). This EUA will remain in effect (meaning this test can be used) for the duration of the COVID-19 declaration under Section 564(b)(1) of the Act, 21 U.S.C. section 360bbb-3(b)(1), unless the authorization  is terminated or revoked.  Performed at [facility], [address] [place], [place] [address]      Radiology Studies: US RENAL  Result Date: 02/28/2023 CLINICAL DATA:  Acute renal failure EXAM: RENAL / URINARY TRACT ULTRASOUND COMPLETE COMPARISON:  Renal ultrasound 04/22/2021 FINDINGS: Right Kidney: Renal measurements: 12.3 x 6.7 x 5.7 cm = volume: 244 mL. Echogenicity within normal limits. No mass or hydronephrosis visualized. Left Kidney: Renal measurements: 11.0 x 5.8 x 5.4 cm = volume: 181 mL. Echogenicity within normal limits. No mass or hydronephrosis visualized. Bladder: Appears normal for degree of bladder  distention. Other: None. IMPRESSION: Unremarkable renal ultrasound. Electronically Signed   By: Minerva Fester M.D.   On: 02/28/2023 02:43    Scheduled Meds:  amLODipine  10 mg Oral Daily   atorvastatin  80 mg Oral Daily   hydrALAZINE  100 mg Oral Q8H   insulin aspart  0-5 Units Subcutaneous QHS   insulin aspart  0-9 Units Subcutaneous TID WC   insulin detemir  7 Units Subcutaneous BID   Continuous Infusions:  lactated ringers 125 mL/hr at 02/28/23 0800     LOS: 1 day   Burnadette Pop, MD [facility] P8/22/2024, 8:06 AM

## 2023-02-28 NOTE — Inpatient Diabetes Management (Signed)
Inpatient Diabetes Program Recommendations  AACE/ADA: New Consensus Statement on Inpatient Glycemic Control (2015)  Target Ranges:  Prepandial:   less than 140 mg/dL      Peak postprandial:   less than 180 mg/dL (1-2 hours)      Critically ill patients:  140 - 180 mg/dL   Lab Results  Component Value Date   GLUCAP 236 (H) 02/28/2023   HGBA1C 13.8 (H) 02/28/2023    Review of Glycemic Control  Diabetes history: DM type 1 Outpatient Diabetes medications: Lantus 40 units qhs, Humalog 0-30 units tid + hs Current orders for Inpatient glycemic control:  Levemir 7 units bid Novolog 0-9 units tid + hs  Inpatient Diabetes Program Recommendations:    -   increase Levemir to 12 units bid -   Change Novolog Correction to Q4 hours, pt still actively vomiting (when eating will need meal coverage added)  Spoke with pt at bedside regarding A1c of 13.8% and glucose control at home. Pt had seen an Endocrinologist, Dr. Allena Katz until he did not have insurance approx 1 year ago. Pt was also on an insulin pump at that time which also controlled his glucose trends better with A1c between 7-9%. Pt was diagnosed with diabetes since the age of 60.  Pt currently follows up with his PCP, Jori Moll, NP at [facility]. Pt does not have insurance provided to him through his employment. Pt is looking for a job that offers benefits with the insurance. I discussed current A1c. Reviewed glucose and A1c goals. Will follow trends while inpatient.  Thanks,  Christena Deem RN, MSN, BC-ADM Inpatient Diabetes Coordinator Team Pager [phone] (8a-5p)

## 2023-02-28 NOTE — Plan of Care (Signed)
  Problem: Education: Goal: Knowledge of General Education information will improve Description: Including pain rating scale, medication(s)/side effects and non-pharmacologic comfort measures Outcome: Completed/Met

## 2023-02-28 NOTE — TOC CM/SW Note (Signed)
Transition of Care [facility]) - Inpatient Brief Assessment   Patient Details  Name: Bradley Davis MRN: [id number] Date of Birth: Jun 03, 1986  [facility] CM/SW Contact:    Tom-Johnson, Hershal Coria, RN Phone Number: 02/28/2023, 2:57 PM   Clinical Narrative:  [facility] with Nausea, Vomiting and Lightheadedness. Found to have Acute Renal Failure on CKD 3a with Creatinine of 3.31. Transferred to [facility] for further management .  From home alone, has two children. Both parents supportive. Independent with care, employed. Does not have DME's at home.  PCP is [name], FNP and uses [facility] on [address].   No TOC needs or recommendations noted at this time.  CM will continue to follow as patient progresses with care towards discharge.       Transition of Care Asessment: Insurance and Status: Insurance coverage has been reviewed Patient has primary care physician: Yes Home environment has been reviewed: Yes Prior level of function:: Independent Prior/Current Home Services: No current home services Social Determinants of Health Reivew: SDOH reviewed no interventions necessary Readmission risk has been reviewed: Yes Transition of care needs: no transition of care needs at this time

## 2023-03-01 ENCOUNTER — Inpatient Hospital Stay (HOSPITAL_COMMUNITY): Payer: Medicaid Other

## 2023-03-01 DIAGNOSIS — N179 Acute kidney failure, unspecified: Secondary | ICD-10-CM | POA: Diagnosis not present

## 2023-03-01 DIAGNOSIS — N1831 Chronic kidney disease, stage 3a: Secondary | ICD-10-CM | POA: Diagnosis not present

## 2023-03-01 LAB — BASIC METABOLIC PANEL
Anion gap: 11 (ref 5–15)
BUN: 34 mg/dL — ABNORMAL HIGH (ref 6–20)
CO2: 20 mmol/L — ABNORMAL LOW (ref 22–32)
Calcium: 8.7 mg/dL — ABNORMAL LOW (ref 8.9–10.3)
Chloride: 104 mmol/L (ref 98–111)
Creatinine, Ser: 3.22 mg/dL — ABNORMAL HIGH (ref 0.61–1.24)
GFR, Estimated: 24 mL/min — ABNORMAL LOW (ref >60)
Glucose, Bld: 317 mg/dL — ABNORMAL HIGH (ref 70–99)
Potassium: 4.2 mmol/L (ref 3.5–5.1)
Sodium: 135 mmol/L (ref 135–145)

## 2023-03-01 LAB — GLUCOSE, CAPILLARY
Glucose-Capillary: 332 mg/dL — ABNORMAL HIGH (ref 70–99)
Glucose-Capillary: 354 mg/dL — ABNORMAL HIGH (ref 70–99)
Glucose-Capillary: 391 mg/dL — ABNORMAL HIGH (ref 70–99)
Glucose-Capillary: 258 mg/dL — ABNORMAL HIGH (ref 70–99)

## 2023-03-01 LAB — SODIUM, URINE, RANDOM: Sodium, Ur: 57 mmol/L

## 2023-03-01 MED ORDER — LABETALOL HCL 200 MG PO TABS
300.0000 mg | ORAL_TABLET | Freq: Three times a day (TID) | ORAL | Status: DC
  Administered 2023-03-01 – 2023-03-08 (×22): 300 mg via ORAL
  Filled 2023-03-01 (×23): qty 2

## 2023-03-01 MED ORDER — LABETALOL HCL 200 MG PO TABS: 200.0000 mg | ORAL_TABLET | Freq: Three times a day (TID) | ORAL | Status: DC

## 2023-03-01 MED ORDER — INSULIN DETEMIR 100 UNIT/ML ~~LOC~~ SOLN
12.0000 [IU] | Freq: Two times a day (BID) | SUBCUTANEOUS | Status: DC
  Administered 2023-03-01 – 2023-03-03 (×6): 12 [IU] via SUBCUTANEOUS
  Filled 2023-03-01 (×8): qty 0.12

## 2023-03-01 MED ORDER — METOCLOPRAMIDE HCL 5 MG/ML IJ SOLN
10.0000 mg | Freq: Three times a day (TID) | INTRAMUSCULAR | Status: DC
  Administered 2023-03-01 – 2023-03-04 (×10): 10 mg via INTRAVENOUS
  Filled 2023-03-01 (×10): qty 2

## 2023-03-01 NOTE — Plan of Care (Signed)
  Problem: Health Behavior/Discharge Planning: Goal: Ability to manage health-related needs will improve Outcome: Progressing   

## 2023-03-01 NOTE — Progress Notes (Signed)
PROGRESS NOTE  Bradley Davis  WUJ:[id number] DOB: 07-Aug-1985 DOA: 02/27/2023 PCP: [name], FNP   Brief Narrative: Patient is a 37 year old male with history of diabetes type 1, hypertension, CKD stage IIIa with baseline creatinine of 1.5-1.9 who presented to [facility] with complaint of nausea and vomiting for 2 days, unable to tolerate oral, lightheadedness.  On presentation, he was hypertensive.  Lab work showed creatinine of 3.31.  Patient was admitted for the management of AKI and CKD stage IIIa and was started on IV fluids.  Assessment & Plan:  Principal Problem:   Acute renal failure superimposed on stage 3a chronic kidney disease (HCC) Active Problems:   DM (diabetes mellitus), type 1 with renal complications (HCC)   Essential hypertension   Intractable nausea and vomiting   AKI on CKD stage IIIa: Baseline creatinine ranges from 1.5-1.9.  Presented with complaint of nausea, vomiting, unable to tolerate oral .  Started on IV fluids.  Patient taking benazepril at home.  Currently on hold.  Renal ultrasound was unremarkable for acute changes  Intractable nausea/vomiting: 2 days history of intractable nausea and vomiting.  Continue antiemetics, IV fluids.  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  Currently on full liquid diet.  Will slowly advance as tolerated.  Continue Reglan for now.  Lightheadedness: Most like this is from dehydration.  Continue IV hydration  Type 1 diabetes: On 70/30 insulin at home.  Follows with cardiology.  Continue monitor blood sugars.  Continue current insulin regimen.  Diabetes is uncontrolled.  Recent A1c of 13.8.  Consulted diabetic coordinator  Hypertension: Severely hypertensive.  Home amlodipine, hydralazine restarted.  Benazepril on hold.  Started on labetalol  Morbid obesity: BMI 36.1          DVT prophylaxis:SCDs Start: 02/27/23 2341     Code Status: Full Code  Family Communication: None at the bedside  Patient  status: Inpatient  Patient is from : Home  Anticipated discharge to: Home  Estimated DC date: After Improvement in the kidney function   Consultants: None  Procedures: None  Antimicrobials:  Anti-infectives (From admission, onward)    None       Subjective: Patient seen and examined at bedside today.  Hypotensive this morning.  Still complains of nausea, had to episodes of vomiting.  Currently on full liquid diet.  Denies any abdomen pain.  Passing gas  Objective: Vitals:   02/28/23 1623 02/28/23 2053 03/01/23 0648 03/01/23 0958  BP: (!) 180/106 (!) 158/96 (!) 172/113 (!) 160/93  Pulse: 83 94 88 73  Resp: 20   17  Temp: 98.2 F (36.8 C) 99 F (37.2 C) 98.2 F (36.8 C) 98 F (36.7 C)  TempSrc:  Oral Oral   SpO2: 100% 96% 100% 100%  Weight:      Height:        Intake/Output Summary (Last 24 hours) at 03/01/2023 1354 Last data filed at 03/01/2023 0930 Gross per 24 hour  Intake 1893.95 ml  Output 2450 ml  Net -556.05 ml   Filed Weights   02/27/23 1719 02/27/23 2321  Weight: 104.3 kg 114.2 kg    Examination:  General exam: Overall comfortable, not in distress.  Morbidly obese HEENT: PERRL Respiratory system:  no wheezes or crackles  Cardiovascular system: S1 & S2 heard, RRR.  Gastrointestinal system: Abdomen is nondistended, soft and nontender. Central nervous system: Alert and oriented Extremities: No edema, no clubbing ,no cyanosis Skin: No rashes, no ulcers,no icterus     Data Reviewed: I  have personally reviewed following labs and imaging studies  CBC: Recent Labs  Lab 02/27/23 1851 02/28/23 0412  WBC 7.7 8.1  NEUTROABS 4.3 4.0  HGB 14.6 12.5*  HCT 43.0 37.1*  MCV 79.2* 79.1*  PLT 420* 346   Basic Metabolic Panel: Recent Labs  Lab 02/27/23 1851 02/28/23 0412 03/01/23 0721  NA 135 132* 135  K 3.8 3.5 4.2  CL 103 103 104  CO2 23 20* 20*  GLUCOSE 241* 247* 317*  BUN 48* 39* 34*  CREATININE 3.31* 3.09* 3.22*  CALCIUM 9.2 8.2* 8.7*   MG  --  1.9  --      Recent Results (from the past 240 hour(s))  Resp panel by RT-PCR (RSV, Flu A&B, Covid) Anterior Nasal Swab     Status: None   Collection Time: 02/27/23  5:22 PM   Specimen: Anterior Nasal Swab  Result Value Ref Range Status   SARS Coronavirus 2 by RT PCR NEGATIVE NEGATIVE Final    Comment: (NOTE) SARS-CoV-2 target nucleic acids are NOT DETECTED.  The SARS-CoV-2 RNA is generally detectable in upper respiratory specimens during the acute phase of infection. The lowest concentration of SARS-CoV-2 viral copies this assay can detect is 138 copies/mL. A negative result does not preclude SARS-Cov-2 infection and should not be used as the sole basis for treatment or other patient management decisions. A negative result may occur with  improper specimen collection/handling, submission of specimen other than nasopharyngeal swab, presence of viral mutation(s) within the areas targeted by this assay, and inadequate number of viral copies(<138 copies/mL). A negative result must be combined with clinical observations, patient history, and epidemiological information. The expected result is Negative.  Fact Sheet for Patients:  BloggerCourse.com  Fact Sheet for Healthcare Providers:  SeriousBroker.it  This test is no t yet approved or cleared by the [place] FDA and  has been authorized for detection and/or diagnosis of SARS-CoV-2 by FDA under an Emergency Use Authorization (EUA). This EUA will remain  in effect (meaning this test can be used) for the duration of the COVID-19 declaration under Section 564(b)(1) of the Act, 21 U.S.C.section 360bbb-3(b)(1), unless the authorization is terminated  or revoked sooner.       Influenza A by PCR NEGATIVE NEGATIVE Final   Influenza B by PCR NEGATIVE NEGATIVE Final    Comment: (NOTE) The Xpert Xpress SARS-CoV-2/FLU/RSV plus assay is intended as an aid in the diagnosis of  influenza from Nasopharyngeal swab specimens and should not be used as a sole basis for treatment. Nasal washings and aspirates are unacceptable for Xpert Xpress SARS-CoV-2/FLU/RSV testing.  Fact Sheet for Patients: BloggerCourse.com  Fact Sheet for Healthcare Providers: SeriousBroker.it  This test is not yet approved or cleared by the [place] FDA and has been authorized for detection and/or diagnosis of SARS-CoV-2 by FDA under an Emergency Use Authorization (EUA). This EUA will remain in effect (meaning this test can be used) for the duration of the COVID-19 declaration under Section 564(b)(1) of the Act, 21 U.S.C. section 360bbb-3(b)(1), unless the authorization is terminated or revoked.     Resp Syncytial Virus by PCR NEGATIVE NEGATIVE Final    Comment: (NOTE) Fact Sheet for Patients: BloggerCourse.com  Fact Sheet for Healthcare Providers: SeriousBroker.it  This test is not yet approved or cleared by the [place] FDA and has been authorized for detection and/or diagnosis of SARS-CoV-2 by FDA under an Emergency Use Authorization (EUA). This EUA will remain in effect (meaning this test can be used) for  the duration of the COVID-19 declaration under Section 564(b)(1) of the Act, 21 U.S.C. section 360bbb-3(b)(1), unless the authorization is terminated or revoked.  Performed at [facility], [address] [place], [place] [address]      Radiology Studies: US RENAL  Result Date: 02/28/2023 CLINICAL DATA:  Acute renal failure EXAM: RENAL / URINARY TRACT ULTRASOUND COMPLETE COMPARISON:  Renal ultrasound 04/22/2021 FINDINGS: Right Kidney: Renal measurements: 12.3 x 6.7 x 5.7 cm = volume: 244 mL. Echogenicity within normal limits. No mass or hydronephrosis visualized. Left Kidney: Renal measurements: 11.0 x 5.8 x 5.4 cm = volume: 181 mL. Echogenicity within  normal limits. No mass or hydronephrosis visualized. Bladder: Appears normal for degree of bladder distention. Other: None. IMPRESSION: Unremarkable renal ultrasound. Electronically Signed   By: Minerva Fester M.D.   On: 02/28/2023 02:43    Scheduled Meds:  amLODipine  10 mg Oral Daily   atorvastatin  80 mg Oral Daily   hydrALAZINE  100 mg Oral Q8H   insulin aspart  0-5 Units Subcutaneous QHS   insulin aspart  0-9 Units Subcutaneous TID WC   insulin detemir  12 Units Subcutaneous BID   labetalol  200 mg Oral Q8H   metoCLOPramide (REGLAN) injection  10 mg Intravenous Q8H   Continuous Infusions:  sodium chloride 1,000 mL (03/01/23 0920)     LOS: 2 days   Burnadette Pop, MD [facility] P8/23/2024, 1:54 PM

## 2023-03-02 DIAGNOSIS — N1831 Chronic kidney disease, stage 3a: Secondary | ICD-10-CM | POA: Diagnosis not present

## 2023-03-02 DIAGNOSIS — N179 Acute kidney failure, unspecified: Secondary | ICD-10-CM | POA: Diagnosis not present

## 2023-03-02 LAB — BASIC METABOLIC PANEL
Anion gap: 10 (ref 5–15)
BUN: 30 mg/dL — ABNORMAL HIGH (ref 6–20)
CO2: 20 mmol/L — ABNORMAL LOW (ref 22–32)
Calcium: 8.3 mg/dL — ABNORMAL LOW (ref 8.9–10.3)
Chloride: 106 mmol/L (ref 98–111)
Creatinine, Ser: 3.17 mg/dL — ABNORMAL HIGH (ref 0.61–1.24)
GFR, Estimated: 25 mL/min — ABNORMAL LOW (ref >60)
Glucose, Bld: 218 mg/dL — ABNORMAL HIGH (ref 70–99)
Potassium: 3.9 mmol/L (ref 3.5–5.1)
Sodium: 136 mmol/L (ref 135–145)

## 2023-03-02 LAB — GLUCOSE, CAPILLARY
Glucose-Capillary: 206 mg/dL — ABNORMAL HIGH (ref 70–99)
Glucose-Capillary: 290 mg/dL — ABNORMAL HIGH (ref 70–99)
Glucose-Capillary: 332 mg/dL — ABNORMAL HIGH (ref 70–99)
Glucose-Capillary: 215 mg/dL — ABNORMAL HIGH (ref 70–99)

## 2023-03-02 MED ORDER — SENNA 8.6 MG PO TABS
1.0000 | ORAL_TABLET | Freq: Two times a day (BID) | ORAL | Status: DC
  Administered 2023-03-02 – 2023-03-07 (×7): 8.6 mg via ORAL
  Filled 2023-03-02 (×13): qty 1

## 2023-03-02 MED ORDER — POLYETHYLENE GLYCOL 3350 17 G PO PACK
17.0000 g | PACK | Freq: Every day | ORAL | Status: DC
  Administered 2023-03-07: 17 g via ORAL
  Filled 2023-03-02 (×7): qty 1

## 2023-03-02 NOTE — Plan of Care (Signed)
  Problem: Clinical Measurements: Goal: Respiratory complications will improve Outcome: Progressing Goal: Cardiovascular complication will be avoided Outcome: Progressing   Problem: Nutrition: Goal: Adequate nutrition will be maintained Outcome: Progressing   Problem: Coping: Goal: Level of anxiety will decrease Outcome: Progressing   

## 2023-03-02 NOTE — Progress Notes (Signed)
PROGRESS NOTE  Bradley Davis  WGN:[id number] DOB: Sep 01, 1985 DOA: 02/27/2023 PCP: [name], FNP   Brief Narrative: Patient is a 37 year old male with history of diabetes type 1, hypertension, CKD stage IIIa with baseline creatinine of 1.5-1.9 who presented to [facility] with complaint of nausea and vomiting for 2 days, unable to tolerate oral, lightheadedness.  On presentation, he was hypertensive.  Lab work showed creatinine of 3.31.  Patient was admitted for the management of AKI and CKD stage IIIa and was started on IV fluids.  Nausea and vomiting finally better today after starting on Reglan.  Diet advanced to soft  Assessment & Plan:  Principal Problem:   Acute renal failure superimposed on stage 3a chronic kidney disease (HCC) Active Problems:   DM (diabetes mellitus), type 1 with renal complications (HCC)   Essential hypertension   Intractable nausea and vomiting   AKI on CKD stage IIIa: Baseline creatinine around 2.  Follows with Dr. Lequita Halt  at [facility] .He presented with complaint of nausea, vomiting, unable to tolerate oral .  Started on IV fluids.  Patient taking benazepril at home.  Currently on hold.  Renal ultrasound was unremarkable for acute changes.  Creatinine has.  Around 3.  This might be his new baseline.  He does not look volume overloaded.  Urine sodium more than 20.  We recommend to follow-up with his nephrologist at [facility]  Intractable nausea/vomiting: 2 days history of intractable nausea and vomiting.  Continue antiemetics, until IV fluids.  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  He feels better today after starting on Reglan.  Diet advanced to soft.  Lightheadedness: We have requested PT evaluation  Type 1 diabetes: On 70/30 insulin at home.  Follows with endocrinology.  Continue monitor blood sugars.  Continue current insulin regimen.  Diabetes is uncontrolled.  Recent A1c of 13.8.  Consulted diabetic coordinator  Hypertension:  Severely hypertensive.  Home amlodipine, hydralazine restarted.  Benazepril on hold.  Started on labetalol  Morbid obesity: BMI 36.1          DVT prophylaxis:SCDs Start: 02/27/23 2341     Code Status: Full Code  Family Communication: None at the bedside  Patient status: Inpatient  Patient is from : Home  Anticipated discharge to: Home  Estimated DC date: Likely tomorrow, awaiting full resolution of nausea and vomiting   Consultants: None  Procedures: None  Antimicrobials:  Anti-infectives (From admission, onward)    None       Subjective: Seen and examined at bedside today.  Hemodynamically stable.  Blood pressure better today.  His nausea and vomiting have improved today.  He has not vomited this morning.  Still complaining of some dizziness.  Objective: Vitals:   03/01/23 1718 03/01/23 2250 03/02/23 0557 03/02/23 0920  BP: (!) 142/93 (!) 159/93 138/88 139/87  Pulse: 99 94 92 84  Resp: 20   15  Temp: 98 F (36.7 C) 98.1 F (36.7 C) 98 F (36.7 C)   TempSrc: Oral Oral Oral   SpO2: 99% 100% 97% 100%  Weight:      Height:        Intake/Output Summary (Last 24 hours) at 03/02/2023 1136 Last data filed at 03/02/2023 1100 Gross per 24 hour  Intake 2979.88 ml  Output 2550 ml  Net 429.88 ml   Filed Weights   02/27/23 1719 02/27/23 2321  Weight: 104.3 kg 114.2 kg    Examination:  General exam: Overall comfortable, not in distress, morbidly obese HEENT:  PERRL Respiratory system:  no wheezes or crackles  Cardiovascular system: S1 & S2 heard, RRR.  Gastrointestinal system: Abdomen is nondistended, soft and nontender. Central nervous system: Alert and oriented Extremities: No edema, no clubbing ,no cyanosis Skin: No rashes, no ulcers,no icterus     Data Reviewed: I have personally reviewed following labs and imaging studies  CBC: Recent Labs  Lab 02/27/23 1851 02/28/23 0412  WBC 7.7 8.1  NEUTROABS 4.3 4.0  HGB 14.6 12.5*  HCT 43.0 37.1*   MCV 79.2* 79.1*  PLT 420* 346   Basic Metabolic Panel: Recent Labs  Lab 02/27/23 1851 02/28/23 0412 03/01/23 0721 03/02/23 0455  NA 135 132* 135 136  K 3.8 3.5 4.2 3.9  CL 103 103 104 106  CO2 23 20* 20* 20*  GLUCOSE 241* 247* 317* 218*  BUN 48* 39* 34* 30*  CREATININE 3.31* 3.09* 3.22* 3.17*  CALCIUM 9.2 8.2* 8.7* 8.3*  MG  --  1.9  --   --      Recent Results (from the past 240 hour(s))  Resp panel by RT-PCR (RSV, Flu A&B, Covid) Anterior Nasal Swab     Status: None   Collection Time: 02/27/23  5:22 PM   Specimen: Anterior Nasal Swab  Result Value Ref Range Status   SARS Coronavirus 2 by RT PCR NEGATIVE NEGATIVE Final    Comment: (NOTE) SARS-CoV-2 target nucleic acids are NOT DETECTED.  The SARS-CoV-2 RNA is generally detectable in upper respiratory specimens during the acute phase of infection. The lowest concentration of SARS-CoV-2 viral copies this assay can detect is 138 copies/mL. A negative result does not preclude SARS-Cov-2 infection and should not be used as the sole basis for treatment or other patient management decisions. A negative result may occur with  improper specimen collection/handling, submission of specimen other than nasopharyngeal swab, presence of viral mutation(s) within the areas targeted by this assay, and inadequate number of viral copies(<138 copies/mL). A negative result must be combined with clinical observations, patient history, and epidemiological information. The expected result is Negative.  Fact Sheet for Patients:  BloggerCourse.com  Fact Sheet for Healthcare Providers:  SeriousBroker.it  This test is no t yet approved or cleared by the [place] FDA and  has been authorized for detection and/or diagnosis of SARS-CoV-2 by FDA under an Emergency Use Authorization (EUA). This EUA will remain  in effect (meaning this test can be used) for the duration of the COVID-19  declaration under Section 564(b)(1) of the Act, 21 U.S.C.section 360bbb-3(b)(1), unless the authorization is terminated  or revoked sooner.       Influenza A by PCR NEGATIVE NEGATIVE Final   Influenza B by PCR NEGATIVE NEGATIVE Final    Comment: (NOTE) The Xpert Xpress SARS-CoV-2/FLU/RSV plus assay is intended as an aid in the diagnosis of influenza from Nasopharyngeal swab specimens and should not be used as a sole basis for treatment. Nasal washings and aspirates are unacceptable for Xpert Xpress SARS-CoV-2/FLU/RSV testing.  Fact Sheet for Patients: BloggerCourse.com  Fact Sheet for Healthcare Providers: SeriousBroker.it  This test is not yet approved or cleared by the [place] FDA and has been authorized for detection and/or diagnosis of SARS-CoV-2 by FDA under an Emergency Use Authorization (EUA). This EUA will remain in effect (meaning this test can be used) for the duration of the COVID-19 declaration under Section 564(b)(1) of the Act, 21 U.S.C. section 360bbb-3(b)(1), unless the authorization is terminated or revoked.     Resp Syncytial Virus by PCR NEGATIVE  NEGATIVE Final    Comment: (NOTE) Fact Sheet for Patients: BloggerCourse.com  Fact Sheet for Healthcare Providers: SeriousBroker.it  This test is not yet approved or cleared by the [place] FDA and has been authorized for detection and/or diagnosis of SARS-CoV-2 by FDA under an Emergency Use Authorization (EUA). This EUA will remain in effect (meaning this test can be used) for the duration of the COVID-19 declaration under Section 564(b)(1) of the Act, 21 U.S.C. section 360bbb-3(b)(1), unless the authorization is terminated or revoked.  Performed at [facility], [address], [place], [place] [address]      Radiology Studies: DG Abd 1 View  Result Date: 03/01/2023 CLINICAL DATA:   Abdominal distension. EXAM: ABDOMEN - 1 VIEW COMPARISON:  Pelvic CT 04/21/2021 FINDINGS: No bowel dilatation or evidence of obstruction. Small to moderate colonic stool burden. No abnormal rectal distention. No radiopaque calculi or abnormal soft tissue calcifications. Chronic right acetabular excrescence. The included lung bases are clear. IMPRESSION: Normal bowel gas pattern. Small to moderate colonic stool burden. Electronically Signed   By: Narda Rutherford M.D.   On: 03/01/2023 19:03    Scheduled Meds:  amLODipine  10 mg Oral Daily   atorvastatin  80 mg Oral Daily   hydrALAZINE  100 mg Oral Q8H   insulin aspart  0-5 Units Subcutaneous QHS   insulin aspart  0-9 Units Subcutaneous TID WC   insulin detemir  12 Units Subcutaneous BID   labetalol  300 mg Oral Q8H   metoCLOPramide (REGLAN) injection  10 mg Intravenous Q8H   polyethylene glycol  17 g Oral Daily   senna  1 tablet Oral BID   Continuous Infusions:  sodium chloride 75 mL/hr at 03/02/23 0929     LOS: 3 days   Burnadette Pop, MD [facility] P8/24/2024, 11:36 AM

## 2023-03-03 ENCOUNTER — Inpatient Hospital Stay (HOSPITAL_COMMUNITY): Payer: Medicaid Other

## 2023-03-03 DIAGNOSIS — N1831 Chronic kidney disease, stage 3a: Secondary | ICD-10-CM | POA: Diagnosis not present

## 2023-03-03 DIAGNOSIS — N179 Acute kidney failure, unspecified: Secondary | ICD-10-CM | POA: Diagnosis not present

## 2023-03-03 LAB — GLUCOSE, CAPILLARY
Glucose-Capillary: 203 mg/dL — ABNORMAL HIGH (ref 70–99)
Glucose-Capillary: 227 mg/dL — ABNORMAL HIGH (ref 70–99)
Glucose-Capillary: 272 mg/dL — ABNORMAL HIGH (ref 70–99)
Glucose-Capillary: 252 mg/dL — ABNORMAL HIGH (ref 70–99)

## 2023-03-03 LAB — BASIC METABOLIC PANEL
Anion gap: 8 (ref 5–15)
BUN: 27 mg/dL — ABNORMAL HIGH (ref 6–20)
CO2: 20 mmol/L — ABNORMAL LOW (ref 22–32)
Calcium: 8.6 mg/dL — ABNORMAL LOW (ref 8.9–10.3)
Chloride: 107 mmol/L (ref 98–111)
Creatinine, Ser: 3.07 mg/dL — ABNORMAL HIGH (ref 0.61–1.24)
GFR, Estimated: 26 mL/min — ABNORMAL LOW (ref >60)
Glucose, Bld: 222 mg/dL — ABNORMAL HIGH (ref 70–99)
Potassium: 3.7 mmol/L (ref 3.5–5.1)
Sodium: 135 mmol/L (ref 135–145)

## 2023-03-03 MED ORDER — MECLIZINE HCL 25 MG PO TABS
25.0000 mg | ORAL_TABLET | Freq: Three times a day (TID) | ORAL | Status: DC | PRN
  Administered 2023-03-03: 25 mg via ORAL
  Filled 2023-03-03: qty 1

## 2023-03-03 MED ORDER — SODIUM BICARBONATE 650 MG PO TABS
650.0000 mg | ORAL_TABLET | Freq: Two times a day (BID) | ORAL | Status: DC
  Administered 2023-03-03 – 2023-03-06 (×7): 650 mg via ORAL
  Filled 2023-03-03 (×8): qty 1

## 2023-03-03 NOTE — Progress Notes (Signed)
PROGRESS NOTE  [facility]  NWG:[id number] DOB: 1985/12/16 DOA: 02/27/2023 PCP: Bradley Going D, FNP   Brief Narrative: Patient is a 37 year old male with history of diabetes type 1, hypertension, CKD stage IIIa with baseline creatinine of 1.5-1.9 who presented to [facility] with complaint of nausea and vomiting for 2 days, unable to tolerate oral, lightheadedness.  On presentation, he was hypertensive.  Lab work showed creatinine of 3.31.  Patient was admitted for the management of AKI and CKD stage IIIa and was started on IV fluids.  Nausea and vomiting finally better today after starting on Reglan.  Diet advanced to soft and tolerating.  Became dizzy and unable to ambulate during physical therapy session today.  Assessment & Plan:  Principal Problem:   Acute renal failure superimposed on stage 3a chronic kidney disease (HCC) Active Problems:   DM (diabetes mellitus), type 1 with renal complications (HCC)   Essential hypertension   Intractable nausea and vomiting   AKI on CKD stage IIIa: Baseline creatinine around 2.  Follows with Dr. Lequita Davis  at [facility] .He presented with complaint of nausea, vomiting, unable to tolerate oral .  Started on IV fluids.  Patient taking benazepril at home.  Currently on hold.  Renal ultrasound was unremarkable for acute changes.  Creatinine has.  Around 3.  This might be his new baseline.  He does not look volume overloaded.  Urine sodium more than 20.  We recommend to follow-up with [facility] nephrologist at [facility].  Intractable nausea/vomiting: 2-3 days history of intractable nausea and vomiting.  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  He feels better after starting on Reglan.  Diet advanced to soft.  Lightheadedness/orthostatic hypotension: Patient came dizzy, lightheaded during physical therapy evaluation.  Will continue gentle IV fluids.  Continue meclizine.  Also will get CT of head At baseline, he is ambulatory without any  problem  Type 1 diabetes: On 70/30 insulin at home.  Follows with endocrinology.  Continue monitor blood sugars.  Continue current insulin regimen.  Diabetes is uncontrolled.  Recent A1c of 13.8.  Consulted diabetic coordinator  Hypertension: Severely hypertensive on presentation.  Home amlodipine, hydralazine restarted.  Benazepril on hold.  Started on labetalol.  Blood pressure better  Morbid obesity: BMI 36.1          DVT prophylaxis:SCDs Start: 02/27/23 2341     Code Status: Full Code  Family Communication: None at the bedside  Patient status: Inpatient  Patient is from : Home  Anticipated discharge to: Home  Estimated DC date: Likely tomorrow, awaiting  resolution of dizziness/orthostatic hypotension  Consultants: None  Procedures: None  Antimicrobials:  Anti-infectives (From admission, onward)    None       Subjective: Patient seen and examined at bedside today.  He appeared better.  No nausea or vomiting this morning.  No abdominal pain.  Finally started eating food.  During physical therapy assessment, he became dizzy and lightheaded.  Objective: Vitals:   03/02/23 1500 03/02/23 1625 03/02/23 2120 03/03/23 0518  BP: (!) 172/93 (!) 144/83 (!) 158/97 (!) 153/88  Pulse: 90 93 97 92  Resp:  18 18 18   Temp:  98.5 F (36.9 C) 98.3 F (36.8 C) (!) 97.5 F (36.4 C)  TempSrc:   Oral Oral  SpO2: 100% 98% 99% 99%  Weight:      Height:        Intake/Output Summary (Last 24 hours) at 03/03/2023 1120 Last data filed at 03/03/2023 0749 Gross per 24  hour  Intake 1812.53 ml  Output 2350 ml  Net -537.47 ml   Filed Weights   02/27/23 1719 02/27/23 2321  Weight: 104.3 kg 114.2 kg    Examination:  General exam: Overall comfortable, not in distress, morbidly obese HEENT: PERRL Respiratory system:  no wheezes or crackles  Cardiovascular system: S1 & S2 heard, RRR.  Gastrointestinal system: Abdomen is nondistended, soft and nontender. Central nervous  system: Alert and oriented Extremities: No edema, no clubbing ,no cyanosis Skin: No rashes, no ulcers,no icterus     Data Reviewed: I have personally reviewed following labs and imaging studies  CBC: Recent Labs  Lab 02/27/23 1851 02/28/23 0412  WBC 7.7 8.1  NEUTROABS 4.3 4.0  HGB 14.6 12.5*  HCT 43.0 37.1*  MCV 79.2* 79.1*  PLT 420* 346   Basic Metabolic Panel: Recent Labs  Lab 02/27/23 1851 02/28/23 0412 03/01/23 0721 03/02/23 0455 03/03/23 0633  NA 135 132* 135 136 135  K 3.8 3.5 4.2 3.9 3.7  CL 103 103 104 106 107  CO2 23 20* 20* 20* 20*  GLUCOSE 241* 247* 317* 218* 222*  BUN 48* 39* 34* 30* 27*  CREATININE 3.31* 3.09* 3.22* 3.17* 3.07*  CALCIUM 9.2 8.2* 8.7* 8.3* 8.6*  MG  --  1.9  --   --   --      Recent Results (from the past 240 hour(s))  Resp panel by RT-PCR (RSV, Flu A&B, Covid) Anterior Nasal Swab     Status: None   Collection Time: 02/27/23  5:22 PM   Specimen: Anterior Nasal Swab  Result Value Ref Range Status   SARS Coronavirus 2 by RT PCR NEGATIVE NEGATIVE Final    Comment: (NOTE) SARS-CoV-2 target nucleic acids are NOT DETECTED.  The SARS-CoV-2 RNA is generally detectable in upper respiratory specimens during the acute phase of infection. The lowest concentration of SARS-CoV-2 viral copies this assay can detect is 138 copies/mL. A negative result does not preclude SARS-Cov-2 infection and should not be used as the sole basis for treatment or other patient management decisions. A negative result may occur with  improper specimen collection/handling, submission of specimen other than nasopharyngeal swab, presence of viral mutation(s) within the areas targeted by this assay, and inadequate number of viral copies(<138 copies/mL). A negative result must be combined with clinical observations, patient history, and epidemiological information. The expected result is Negative.  Fact Sheet for Patients:   BloggerCourse.com  Fact Sheet for Healthcare Providers:  SeriousBroker.it  This test is no t yet approved or cleared by the [place] FDA and  has been authorized for detection and/or diagnosis of SARS-CoV-2 by FDA under an Emergency Use Authorization (EUA). This EUA will remain  in effect (meaning this test can be used) for the duration of the COVID-19 declaration under Section 564(b)(1) of the Act, 21 U.S.C.section 360bbb-3(b)(1), unless the authorization is terminated  or revoked sooner.       Influenza A by PCR NEGATIVE NEGATIVE Final   Influenza B by PCR NEGATIVE NEGATIVE Final    Comment: (NOTE) The Xpert Xpress SARS-CoV-2/FLU/RSV plus assay is intended as an aid in the diagnosis of influenza from Nasopharyngeal swab specimens and should not be used as a sole basis for treatment. Nasal washings and aspirates are unacceptable for Xpert Xpress SARS-CoV-2/FLU/RSV testing.  Fact Sheet for Patients: BloggerCourse.com  Fact Sheet for Healthcare Providers: SeriousBroker.it  This test is not yet approved or cleared by the [place] FDA and has been authorized for detection and/or  diagnosis of SARS-CoV-2 by FDA under an Emergency Use Authorization (EUA). This EUA will remain in effect (meaning this test can be used) for the duration of the COVID-19 declaration under Section 564(b)(1) of the Act, 21 U.S.C. section 360bbb-3(b)(1), unless the authorization is terminated or revoked.     Resp Syncytial Virus by PCR NEGATIVE NEGATIVE Final    Comment: (NOTE) Fact Sheet for Patients: BloggerCourse.com  Fact Sheet for Healthcare Providers: SeriousBroker.it  This test is not yet approved or cleared by the [place] FDA and has been authorized for detection and/or diagnosis of SARS-CoV-2 by FDA under an Emergency Use  Authorization (EUA). This EUA will remain in effect (meaning this test can be used) for the duration of the COVID-19 declaration under Section 564(b)(1) of the Act, 21 U.S.C. section 360bbb-3(b)(1), unless the authorization is terminated or revoked.  Performed at [facility], [address] [place], [place] [address]      Radiology Studies: DG Abd 1 View  Result Date: 03/01/2023 CLINICAL DATA:  Abdominal distension. EXAM: ABDOMEN - 1 VIEW COMPARISON:  Pelvic CT 04/21/2021 FINDINGS: No bowel dilatation or evidence of obstruction. Small to moderate colonic stool burden. No abnormal rectal distention. No radiopaque calculi or abnormal soft tissue calcifications. Chronic right acetabular excrescence. The included lung bases are clear. IMPRESSION: Normal bowel gas pattern. Small to moderate colonic stool burden. Electronically Signed   By: Narda Rutherford M.Davis.   On: 03/01/2023 19:03    Scheduled Meds:  amLODipine  10 mg Oral Daily   atorvastatin  80 mg Oral Daily   hydrALAZINE  100 mg Oral Q8H   insulin aspart  0-5 Units Subcutaneous QHS   insulin aspart  0-9 Units Subcutaneous TID WC   insulin detemir  12 Units Subcutaneous BID   labetalol  300 mg Oral Q8H   metoCLOPramide (REGLAN) injection  10 mg Intravenous Q8H   polyethylene glycol  17 g Oral Daily   senna  1 tablet Oral BID   sodium bicarbonate  650 mg Oral BID   Continuous Infusions:  sodium chloride 75 mL/hr at 03/03/23 0450     LOS: 4 days   Burnadette Pop, MD [facility] P8/25/2024, 11:20 AM

## 2023-03-03 NOTE — Plan of Care (Signed)
  Problem: Clinical Measurements: Goal: Respiratory complications will improve Outcome: Progressing Goal: Cardiovascular complication will be avoided Outcome: Progressing   Problem: Nutrition: Goal: Adequate nutrition will be maintained Outcome: Progressing   Problem: Coping: Goal: Level of anxiety will decrease Outcome: Progressing   Problem: Elimination: Goal: Will not experience complications related to urinary retention Outcome: Progressing   Problem: Nutritional: Goal: Maintenance of adequate nutrition will improve Outcome: Progressing

## 2023-03-03 NOTE — Evaluation (Signed)
Physical Therapy Evaluation Patient Details Name: [facility] MRN: [id number] DOB: 03-17-1986 Today's Date: 03/03/2023  History of Present Illness  Pt presented initially to [facility] with reports of nausea and vomiting for 2 days. Pt was unable to tolerate oral and was lightheaded. Pt was hypertensive on evaluation with elevated creatinine. Pt was admitted for AKI and CKD stage III. PMH: DM 1, HTN, CKD III.  Clinical Impression  Pt is presenting below baseline level of functioning. Previously pt was Independent with all age related activities including driving, working and functional mobility. Currently pt is Mod I for bed mobility, CGA for sit to stand and Min A for 5 ft of gait without AD. Pt was very unstable and staggering all directions hanging on to various objects in room. Pt was assisted back to sitting EOB and had 30 point drop in systolic from sitting to standing. RN and MD were notified. Once medically stable pt has good potential to return to PLOF. Due to current functional status, home set up no recommended skilled physical therapy at this time on discharge. Will continue to follow in acute hospital setting to assess for needs and work towards PLOF and decreased risk for injury/falls at home.               Equipment Recommendations Other (comment) (TBD most likely no equipment once medically stable.)  Recommendations for Other Services       Functional Status Assessment Patient has had a recent decline in their functional status and demonstrates the ability to make significant improvements in function in a reasonable and predictable amount of time.     Precautions / Restrictions Precautions Precautions: None Restrictions Weight Bearing Restrictions: No      Mobility  Bed Mobility Overal bed mobility: Modified Independent       Transfers Overall transfer level: Needs assistance Equipment used: None Transfers: Sit to/from Stand Sit to Stand: Contact  guard assist           General transfer comment: pt is very unstable in standing, intermittent UE support on IV pole    Ambulation/Gait Ambulation/Gait assistance: Min assist Gait Distance (Feet): 8 Feet Assistive device: None Gait Pattern/deviations: Staggering right, Staggering left, Step-through pattern, Decreased step length - right, Decreased step length - left Gait velocity: significantly decreased cadence. Gait velocity interpretation: <1.31 ft/sec, indicative of household ambulator   General Gait Details: Pt attempted gait, uneven step lengths pt was holding onto bed, wall and sink stating the R side of his head felt dizzy and R foot felt numb. Pt was bending over stated he "didn't feel right" Pt was assisted back to sitting. BP taken Sitting 168/92 standing 134/95. RN and MD notified.      Balance Overall balance assessment: Needs assistance Sitting-balance support: Bilateral upper extremity supported Sitting balance-Leahy Scale: Fair     Standing balance support: Bilateral upper extremity supported, Single extremity supported, No upper extremity supported Standing balance-Leahy Scale: Poor Standing balance comment: Min A pt was staggering around the room, not in a straight line holding onto window sill, sink, bed           Pertinent Vitals/Pain Pain Assessment Pain Assessment: No/denies pain    Home Living Family/patient expects to be discharged to:: Private residence Living Arrangements: Alone Available Help at Discharge: Family;Available PRN/intermittently (family and mother) Type of Home: Apartment Home Access: Level entry       Home Layout: One level Home Equipment: None      Prior  Function Prior Level of Function : Independent/Modified Independent;Driving;Working/employed             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     Extremity/Trunk Assessment   Upper Extremity Assessment Upper Extremity  Assessment: Generalized weakness    Lower Extremity Assessment Lower Extremity Assessment: Generalized weakness    Cervical / Trunk Assessment Cervical / Trunk Assessment: Normal  Communication   Communication Communication: No apparent difficulties  Cognition Arousal: Alert Behavior During Therapy: WFL for tasks assessed/performed Overall Cognitive Status: Within Functional Limits for tasks assessed          General Comments General comments (skin integrity, edema, etc.): RN and MD were notified of pt symptoms and Orthostatics.        Assessment/Plan    PT Assessment Patient needs continued PT services  PT Problem List Decreased mobility;Decreased balance;Decreased activity tolerance       PT Treatment Interventions DME instruction;Therapeutic exercise;Gait training;Balance training;Neuromuscular re-education;Functional mobility training;Therapeutic activities;Patient/family education    PT Goals (Current goals can be found in the Care Plan section)  Acute Rehab PT Goals Patient Stated Goal: To return home PT Goal Formulation: With patient Time For Goal Achievement: 03/17/23 Potential to Achieve Goals: Good    Frequency Min 1X/week        AM-PAC PT "6 Clicks" Mobility  Outcome Measure Help needed turning from your back to your side while in a flat bed without using bedrails?: None Help needed moving from lying on your back to sitting on the side of a flat bed without using bedrails?: None Help needed moving to and from a bed to a chair (including a wheelchair)?: A Little Help needed standing up from a chair using your arms (e.g., wheelchair or bedside chair)?: A Little Help needed to walk in hospital room?: A Little Help needed climbing 3-5 steps with a railing? : A Lot 6 Click Score: 19    End of Session Equipment Utilized During Treatment: Gait belt Activity Tolerance: Treatment limited secondary to medical complications (Comment) (orthostatics, dizzy,  numbness in R foot) Patient left: in bed;with nursing/sitter in room;with call bell/phone within reach Nurse Communication: Mobility status;Other (comment) (see general comments) PT Visit Diagnosis: Unsteadiness on feet (R26.81);Other abnormalities of gait and mobility (R26.89)    Time: 1007-1030 PT Time Calculation (min) (ACUTE ONLY): 23 min   Charges:   PT Evaluation $PT Eval Low Complexity: 1 Low PT Treatments $Therapeutic Activity: 8-22 mins PT General Charges $$ ACUTE PT VISIT: 1 Visit         Harrel Carina, DPT, CLT  Acute Rehabilitation Services Office: [phone] (Secure chat preferred)   Claudia Desanctis 03/03/2023, 10:40 AM

## 2023-03-04 ENCOUNTER — Inpatient Hospital Stay (HOSPITAL_COMMUNITY): Payer: Medicaid Other

## 2023-03-04 ENCOUNTER — Other Ambulatory Visit (HOSPITAL_COMMUNITY): Payer: Self-pay

## 2023-03-04 DIAGNOSIS — E1159 Type 2 diabetes mellitus with other circulatory complications: Secondary | ICD-10-CM

## 2023-03-04 DIAGNOSIS — E1169 Type 2 diabetes mellitus with other specified complication: Secondary | ICD-10-CM

## 2023-03-04 DIAGNOSIS — E860 Dehydration: Secondary | ICD-10-CM | POA: Diagnosis not present

## 2023-03-04 DIAGNOSIS — E669 Obesity, unspecified: Secondary | ICD-10-CM

## 2023-03-04 DIAGNOSIS — N179 Acute kidney failure, unspecified: Secondary | ICD-10-CM | POA: Diagnosis not present

## 2023-03-04 DIAGNOSIS — I152 Hypertension secondary to endocrine disorders: Secondary | ICD-10-CM

## 2023-03-04 DIAGNOSIS — N1831 Chronic kidney disease, stage 3a: Secondary | ICD-10-CM | POA: Diagnosis not present

## 2023-03-04 DIAGNOSIS — I639 Cerebral infarction, unspecified: Secondary | ICD-10-CM | POA: Diagnosis not present

## 2023-03-04 LAB — BASIC METABOLIC PANEL
Anion gap: 8 (ref 5–15)
BUN: 27 mg/dL — ABNORMAL HIGH (ref 6–20)
CO2: 21 mmol/L — ABNORMAL LOW (ref 22–32)
Calcium: 8.3 mg/dL — ABNORMAL LOW (ref 8.9–10.3)
Chloride: 107 mmol/L (ref 98–111)
Creatinine, Ser: 3.19 mg/dL — ABNORMAL HIGH (ref 0.61–1.24)
GFR, Estimated: 25 mL/min — ABNORMAL LOW (ref >60)
Glucose, Bld: 203 mg/dL — ABNORMAL HIGH (ref 70–99)
Potassium: 3.9 mmol/L (ref 3.5–5.1)
Sodium: 136 mmol/L (ref 135–145)

## 2023-03-04 LAB — GLUCOSE, CAPILLARY
Glucose-Capillary: 236 mg/dL — ABNORMAL HIGH (ref 70–99)
Glucose-Capillary: 250 mg/dL — ABNORMAL HIGH (ref 70–99)
Glucose-Capillary: 241 mg/dL — ABNORMAL HIGH (ref 70–99)
Glucose-Capillary: 224 mg/dL — ABNORMAL HIGH (ref 70–99)

## 2023-03-04 MED ORDER — CLOPIDOGREL BISULFATE 75 MG PO TABS
75.0000 mg | ORAL_TABLET | Freq: Every day | ORAL | Status: DC
  Administered 2023-03-04 – 2023-03-08 (×5): 75 mg via ORAL
  Filled 2023-03-04 (×5): qty 1

## 2023-03-04 MED ORDER — METOCLOPRAMIDE HCL 5 MG/ML IJ SOLN
10.0000 mg | Freq: Three times a day (TID) | INTRAMUSCULAR | Status: DC | PRN
  Administered 2023-03-05: 10 mg via INTRAVENOUS
  Filled 2023-03-04: qty 2

## 2023-03-04 MED ORDER — ASPIRIN 81 MG PO TBEC
81.0000 mg | DELAYED_RELEASE_TABLET | Freq: Every day | ORAL | Status: DC
  Administered 2023-03-04 – 2023-03-08 (×5): 81 mg via ORAL
  Filled 2023-03-04 (×5): qty 1

## 2023-03-04 MED ORDER — INSULIN DETEMIR 100 UNIT/ML ~~LOC~~ SOLN
15.0000 [IU] | Freq: Two times a day (BID) | SUBCUTANEOUS | Status: DC
  Administered 2023-03-04 – 2023-03-06 (×5): 15 [IU] via SUBCUTANEOUS
  Filled 2023-03-04 (×6): qty 0.15

## 2023-03-04 NOTE — Progress Notes (Signed)
Inpatient Rehab Admissions Coordinator Note:   Per therapy recommendations patient was screened for CIR candidacy by Stephania Fragmin, PT. At this time, pt appears to be a potential candidate for CIR. I will place an order for rehab consult for full assessment, per our protocol.  Please contact me any with questions.Estill Dooms, PT, DPT [phone] 03/04/23 3:39 PM

## 2023-03-04 NOTE — TOC Benefit Eligibility Note (Signed)
Patient Product/process development scientist completed.    The patient is insured through [facility] MEDICAID.     Ran test claim for Lantus Pen and the current 30 day co-pay is $4.00.  Ran test claim for Novolog Pen and the current 30 day co-pay is $4.00.  This test claim was processed through [facility]- copay amounts may vary at other pharmacies due to pharmacy/plan contracts, or as the patient moves through the different stages of their insurance plan.     Roland Earl, CPHT Pharmacy Technician III Certified Patient Advocate [facility] Pharm[facility] Patient Advocate Team Direct Number: [phone]  Fax: [phone]

## 2023-03-04 NOTE — Plan of Care (Signed)
  Problem: Health Behavior/Discharge Planning: Goal: Ability to manage health-related needs will improve Outcome: Progressing   Problem: Clinical Measurements: Goal: Ability to maintain clinical measurements within normal limits will improve Outcome: Progressing Goal: Will remain free from infection Outcome: Progressing Goal: Diagnostic test results will improve Outcome: Progressing Goal: Respiratory complications will improve Outcome: Progressing   

## 2023-03-04 NOTE — Progress Notes (Addendum)
PROGRESS NOTE  Bradley Davis  ZOX:[id number] DOB: 1985/11/24 DOA: 02/27/2023 PCP: [name], FNP   Brief Narrative: Patient is a 37 year old male with history of diabetes type 1, hypertension, CKD stage IIIa with baseline creatinine of 1.5-1.9 who presented to [facility] with complaint of nausea and vomiting for 2 days, unable to tolerate oral, lightheadedness.  On presentation, he was hypertensive.  Lab work showed creatinine of 3.31.  Patient was admitted for the management of AKI and CKD stage IIIa and was started on IV fluids.  Nausea and vomiting finally better today after starting on Reglan.  Diet advanced to soft and tolerating.  During physical therapy assessment, he has been dizzy, hypotensive for falls.  PT recommending acute inpatient rehab at present.  Also obtaining MRI of the brain.  Assessment & Plan:  Principal Problem:   Acute renal failure superimposed on stage 3a chronic kidney disease (HCC) Active Problems:   DM (diabetes mellitus), type 1 with renal complications (HCC)   Essential hypertension   Intractable nausea and vomiting   AKI on CKD stage IIIa: Baseline creatinine around 2.  Follows with Dr. Lequita Halt  at [facility] .He presented with complaint of nausea, vomiting, unable to tolerate oral .  Patient taking benazepril at home.  Currently on hold.  Renal ultrasound was unremarkable for acute changes.  Creatinine has plateaued  round 3.  This might be his new baseline.  He does not look volume overloaded.  Urine sodium more than 20.  We recommend to follow-up with his nephrologist at [facility].  He got tons of fluid in this hospitalization, now on hold  Intractable nausea/vomiting: 2-3 days history of intractable nausea and vomiting.  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  He feels better after starting on Reglan.  Diet advanced to soft.  Will advance  Lightheadedness/orthostatic hypotension dizziness/tendency for falls: Patient became dizzy,  lightheaded during physical therapy evaluation.  At baseline, he is ambulatory without any problem.  During repeat physical therapy assessment today, he demonstrated tendency for right lateral lean, loss of balance.  CT head did not show any acute findings.  Will get MRI of the brain.  PT recommending AIR.  He is somewhat orthostatic but its improving and he is hypertensive at baseline so we will discontinue fluid for now  Type 1 diabetes: On 70/30 insulin at home.  Follows with endocrinology.  Continue monitor blood sugars.  Continue current insulin regimen.  Diabetes is uncontrolled.  Recent A1c of 13.8.  Consulted diabetic coordinator  Hypertension: Severely hypertensive on presentation.  Home amlodipine, hydralazine restarted.  Benazepril on hold.  Started on labetalol.  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 Stroke work up initiated      DVT prophylaxis:SCDs Start: 02/27/23 2341     Code Status: Full Code  Family Communication: None at the bedside  Patient status: Inpatient  Patient is from : Home  Anticipated discharge to: Home versus AIR  Estimated DC date: 1 to 2 days  Consultants: None  Procedures: None  Antimicrobials:  Anti-infectives (From admission, onward)    None       Subjective: Patient seen and examined at bedside today.  Hemodynamically stable lying in bed.  Denies any nausea or vomiting or dizziness during my evaluation this morning but when he walked with the physical therapy,  he was unsteady.  Discharge plan canceled   Objective: Vitals:   03/03/23 2058 03/04/23 0654 03/04/23 0657 03/04/23 0839  BP: 125/79 (!) 164/87 (!) 164/87 (!) 152/82  Pulse: 75  86 91  Resp: 18  16 18   Temp: 100 F (37.8 C)  98.3 F (36.8 C) 98.6 F (37 C)  TempSrc: Oral    Oral  SpO2: 98%  98% 96%  Weight:      Height:        Intake/Output Summary (Last 24 hours) at 03/04/2023 1058 Last data filed at 03/04/2023 0159 Gross per 24 hour  Intake 2509.25 ml  Output 3000 ml  Net -490.75 ml   Filed Weights   02/27/23 1719 02/27/23 2321  Weight: 104.3 kg 114.2 kg    Examination:  General exam: Overall comfortable, not in distress, morbidly obese HEENT: PERRL Respiratory system:  no wheezes or crackles  Cardiovascular system: S1 & S2 heard, RRR.  Gastrointestinal system: Abdomen is nondistended, soft and nontender. Central nervous system: Alert and oriented Extremities: No edema, no clubbing ,no cyanosis Skin: No rashes, no ulcers,no icterus     Data Reviewed: I have personally reviewed following labs and imaging studies  CBC: Recent Labs  Lab 02/27/23 1851 02/28/23 0412  WBC 7.7 8.1  NEUTROABS 4.3 4.0  HGB 14.6 12.5*  HCT 43.0 37.1*  MCV 79.2* 79.1*  PLT 420* 346   Basic Metabolic Panel: Recent Labs  Lab 02/28/23 0412 03/01/23 0721 03/02/23 0455 03/03/23 0633 03/04/23 0454  NA 132* 135 136 135 136  K 3.5 4.2 3.9 3.7 3.9  CL 103 104 106 107 107  CO2 20* 20* 20* 20* 21*  GLUCOSE 247* 317* 218* 222* 203*  BUN 39* 34* 30* 27* 27*  CREATININE 3.09* 3.22* 3.17* 3.07* 3.19*  CALCIUM 8.2* 8.7* 8.3* 8.6* 8.3*  MG 1.9  --   --   --   --      Recent Results (from the past 240 hour(s))  Resp panel by RT-PCR (RSV, Flu A&B, Covid) Anterior Nasal Swab     Status: None   Collection Time: 02/27/23  5:22 PM   Specimen: Anterior Nasal Swab  Result Value Ref Range Status   SARS Coronavirus 2 by RT PCR NEGATIVE NEGATIVE Final    Comment: (NOTE) SARS-CoV-2 target nucleic acids are NOT DETECTED.  The SARS-CoV-2 RNA is generally detectable in upper respiratory specimens during the acute phase of infection. The lowest concentration of SARS-CoV-2 viral copies this assay can detect is 138 copies/mL. A negative result does not preclude  SARS-Cov-2 infection and should not be used as the sole basis for treatment or other patient management decisions. A negative result may occur with  improper specimen collection/handling, submission of specimen other than nasopharyngeal swab, presence of viral mutation(s) within the areas targeted by this assay, and inadequate number of viral copies(<138 copies/mL). A negative result must be combined with clinical observations, patient history, and epidemiological information. The expected result is Negative.  Fact Sheet for Patients:  BloggerCourse.com  Fact Sheet for Healthcare Providers:  SeriousBroker.it  This test is no t yet approved or cleared by the [place] FDA and  has been authorized for detection and/or diagnosis of SARS-CoV-2 by FDA under an Emergency Use Authorization (EUA). This EUA will remain  in effect (meaning this test can be used) for the duration of the COVID-19 declaration under Section 564(b)(1) of the Act, 21 U.S.C.section 360bbb-3(b)(1), unless the authorization is terminated  or revoked sooner.  Influenza A by PCR NEGATIVE NEGATIVE Final   Influenza B by PCR NEGATIVE NEGATIVE Final    Comment: (NOTE) The Xpert Xpress SARS-CoV-2/FLU/RSV plus assay is intended as an aid in the diagnosis of influenza from Nasopharyngeal swab specimens and should not be used as a sole basis for treatment. Nasal washings and aspirates are unacceptable for Xpert Xpress SARS-CoV-2/FLU/RSV testing.  Fact Sheet for Patients: BloggerCourse.com  Fact Sheet for Healthcare Providers: SeriousBroker.it  This test is not yet approved or cleared by the [place] FDA and has been authorized for detection and/or diagnosis of SARS-CoV-2 by FDA under an Emergency Use Authorization (EUA). This EUA will remain in effect (meaning this test can be used) for the duration of  the COVID-19 declaration under Section 564(b)(1) of the Act, 21 U.S.C. section 360bbb-3(b)(1), unless the authorization is terminated or revoked.     Resp Syncytial Virus by PCR NEGATIVE NEGATIVE Final    Comment: (NOTE) Fact Sheet for Patients: BloggerCourse.com  Fact Sheet for Healthcare Providers: SeriousBroker.it  This test is not yet approved or cleared by the [place] FDA and has been authorized for detection and/or diagnosis of SARS-CoV-2 by FDA under an Emergency Use Authorization (EUA). This EUA will remain in effect (meaning this test can be used) for the duration of the COVID-19 declaration under Section 564(b)(1) of the Act, 21 U.S.C. section 360bbb-3(b)(1), unless the authorization is terminated or revoked.  Performed at [facility], [address], [place], [place] [address]      Radiology Studies: CT HEAD WO CONTRAST ( )  Result Date: 03/03/2023 CLINICAL DATA:  Syncope/presyncope, cerebrovascular cause suspected. EXAM: CT HEAD WITHOUT CONTRAST TECHNIQUE: Contiguous axial images were obtained from the base of the skull through the vertex without intravenous contrast. RADIATION DOSE REDUCTION: This exam was performed according to the departmental dose-optimization program which includes automated exposure control, adjustment of the mA and/or kV according to patient size and/or use of iterative reconstruction technique. COMPARISON:  CT head without contrast 12/14/2016 at [facility]. FINDINGS: Brain: No acute infarct, hemorrhage, or mass lesion is present. No significant white matter lesions are present. Deep brain nuclei are within normal limits. The ventricles are of normal size. No significant extraaxial fluid collection is present. The brainstem and cerebellum are within normal limits. Midline structures are within normal limits. Vascular: No hyperdense vessel or unexpected  calcification. Skull: Calvarium is intact. Scalp soft tissue swelling is present posteriorly and near the vertex. No underlying fracture is present. Sinuses/Orbits: The paranasal sinuses and mastoid air cells are clear. The globes and orbits are within normal limits. IMPRESSION: 1. Scalp soft tissue swelling posteriorly and near the vertex without underlying fracture. This appears to represent an inflammatory or infectious process rather than trauma. 2. Normal CT appearance of the brain. Electronically Signed   By: Marin Roberts M.D.   On: 03/03/2023 16:24    Scheduled Meds:  amLODipine  10 mg Oral Daily   atorvastatin  80 mg Oral Daily   hydrALAZINE  100 mg Oral Q8H   insulin aspart  0-5 Units Subcutaneous QHS   insulin aspart  0-9 Units Subcutaneous TID WC   insulin detemir  15 Units Subcutaneous BID   labetalol  300 mg Oral Q8H   metoCLOPramide (REGLAN) injection  10 mg Intravenous Q8H   polyethylene glycol  17 g Oral Daily   senna  1 tablet Oral BID   sodium bicarbonate  650 mg Oral BID   Continuous Infusions:  LOS: 5 days   Burnadette Pop, MD [facility] P8/26/2024, 10:58 AM

## 2023-03-04 NOTE — TOC Progression Note (Signed)
Transition of Care [facility]) - Progression Note    Patient Details  Name: Bradley Davis MRN: [id number] Date of Birth: Oct 08, 1985  Transition of [facility]) CM/SW Contact  Tom-Johnson, Hershal Coria, RN Phone Number: 03/04/2023, 5:24 PM  Clinical Narrative:     PT/OT recommended CIR, following for possible admission.   CM will continue to follow as patient progresses with care towards discharge.         Expected Discharge Plan and Services                                               Social Determinants of Health (SDOH) Interventions SDOH Screenings   Food Insecurity: No Food Insecurity (02/27/2023)  Housing: Low Risk  (02/27/2023)  Transportation Needs: No Transportation Needs (02/27/2023)  Utilities: Not At Risk (02/27/2023)  Depression (PHQ2-9): Low Risk  (06/06/2021)  Social Connections: Unknown (11/17/2021)   Received from [facility]  Tobacco Use: Low Risk  (02/27/2023)    Readmission Risk Interventions    02/28/2023    2:56 PM  Readmission Risk Prevention Plan  Post Dischage Appt Complete  Medication Screening Complete  Transportation Screening Complete

## 2023-03-04 NOTE — Consult Note (Signed)
NEUROLOGY CONSULTATION NOTE   Date of service: March 04, 2023 Patient Name: Bradley Davis MRN:  [id number] DOB:  14-Jan-1986 Reason for consult: "MRI positive stroke" Requesting Provider: Burnadette Pop, MD  History of Present Illness  Bradley Davis is a 37 y.o. male with PMH significant for HTN, CKD 3a, DM1 who presented to ED 8/21 c/o nausea and vomiting x 1 day, intermittent positional dizziness and R sided headache. Patient stated that he had not missed any insulin, but had not taken his antihypertensives (on hydralazine, amlodipine and lotensin at home). Appeared dehydrated on ED exam, replenished with 2L bolus. He had also developed AKI with a Cr of 3.6, admitted. MRI was ordered/performed 8/26 due to continued lightheadedness. 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  LKW: 02/26/23. mRS: 0 tNKASE: not offered, outside window Thrombectomy: not offered, outside window and low suspicion of LVO. NIHSS components Score: Comment  1a Level of Conscious 0[x]  1[]  2[]  3[]      1b LOC Questions 0[x]  1[]  2[]       1c LOC Commands 0[x]  1[]  2[]       2 Best Gaze 0[x]  1[]  2[]       3 Visual 0[x]  1[]  2[]  3[]      4 Facial Palsy 0[x]  1[]  2[]  3[]      5a Motor Arm - left 0[x]  1[]  2[]  3[]  4[]  UN[]    5b Motor Arm - Right 0[x]  1[]  2[]  3[]  4[]  UN[]    6a Motor Leg - Left 0[x]  1[]  2[]  3[]  4[]  UN[]    6b Motor Leg - Right 0[x]  1[]  2[]  3[]  4[]  UN[]    7 Limb Ataxia 0[x]  1[]  2[]  3[]  UN[]     8 Sensory 0[x]  1[]  2[]  UN[]      9 Best Language 0[x]  1[]  2[]  3[]      10 Dysarthria 0[x]  1[]  2[]  UN[]      11 Extinct. and Inattention 0[x]  1[]  2[]       TOTAL: 0        ROS   Constitutional Denies weight loss, fever and chills.   HEENT Denies changes in vision and hearing.   Respiratory Denies SOB and cough.   CV Denies palpitations and CP   GI Denies abdominal pain, nausea, vomiting and diarrhea.   GU Denies dysuria and urinary frequency.   MSK Denies myalgia and  joint pain.   Skin Denies rash and pruritus.   Neurological Denies headache and syncope.   Psychiatric Denies recent changes in mood. Denies anxiety and depression.    Past History   Past Medical History:  Diagnosis Date   CKD stage 3a, GFR 45-59 ml/min (HCC)    Diabetes mellitus    Hypertension    Past Surgical History:  Procedure Laterality Date   INCISION AND DRAINAGE ABSCESS Right 04/21/2021   Procedure: INCISION, DRAINAGE, DEBRIDEMENT OF RIGHT THIGH/GROIN INFECTION;  Surgeon: Abigail Miyamoto, MD;  Location: [facility];  Service: General;  Laterality: Right;   IRRIGATION AND DEBRIDEMENT ABSCESS Right 04/23/2021   Procedure: IRRIGATION AND DEBRIDEMENT RIGHT GROIN WOUND; RIGHT THIGH DRESSING CHANGE;  Surgeon: Abigail Miyamoto, MD;  Location: [facility];  Service: General;  Laterality: Right;   IRRIGATION AND DEBRIDEMENT ABSCESS Right 04/26/2021   Procedure: reexploration and debridement of soft tissue infection right groin;  Surgeon: Emelia Loron, MD;  Location: [facility];  Service: General;  Laterality: Right;   History reviewed. No pertinent family history. Social History   Socioeconomic History   Marital status: Single    Spouse name: Not on file  Number of children: Not on file   Years of education: Not on file   Highest education level: Not on file  Occupational History   Not on file  Tobacco Use   Smoking status: Never   Smokeless tobacco: Never  Vaping Use   Vaping status: Never Used  Substance and Sexual Activity   Alcohol use: Not Currently    Comment: socially   Drug use: No   Sexual activity: Not on file  Other Topics Concern   Not on file  Social History Narrative   Not on file   Social Determinants of Health   Financial Resource Strain: Not on file  Food Insecurity: No Food Insecurity (02/27/2023)   Hunger Vital Sign    Worried About Running Out of Food in the Last Year: Never true    Ran Out of Food in the Last Year: Never true  Transportation  Needs: No Transportation Needs (02/27/2023)   PRAPARE - Administrator, Civil Service (Medical): No    Lack of Transportation (Non-Medical): No  Physical Activity: Not on file  Stress: Not on file  Social Connections: Unknown (11/17/2021)   Received from [facility]   Social Network    Social Network: Not on file   Allergies  Allergen Reactions   Ibuprofen Swelling    Pt reports taking after reported allergy w/ no reactions    Medications   Medications Prior to Admission  Medication Sig Dispense Refill Last Dose   acetaminophen (TYLENOL) 325 MG tablet Take 650 mg by mouth as needed for mild pain or headache.   Past Week   amLODipine (NORVASC) 10 MG tablet Take 1 tablet (10 mg total) by mouth daily. 30 tablet 0 02/26/2023   atorvastatin (LIPITOR) 80 MG tablet Take 1 tablet (80 mg total) by mouth daily.   02/26/2023   benazepril (LOTENSIN) 20 MG tablet Take 20 mg by mouth daily.   02/26/2023   HUMALOG KWIKPEN 100 UNIT/ML KwikPen Inject 1-30 Units into the skin in the morning, at noon, in the evening, and at bedtime. Per sliding scale   02/27/2023   hydrochlorothiazide (HYDRODIURIL) 25 MG tablet Take 50 mg by mouth daily.   02/26/2023   Insulin Glargine (BASAGLAR KWIKPEN) 100 UNIT/ML Inject 40 Units into the skin at bedtime.   02/26/2023   sildenafil (VIAGRA) 50 MG tablet Take 50 mg by mouth as needed for erectile dysfunction.   02/25/2023   sodium chloride (OCEAN) 0.65 % nasal spray Place 2 sprays into the nose daily as needed for congestion.   Past Week   Vitamin D, Ergocalciferol, (DRISDOL) 1.25 MG (50000 UNIT) CAPS capsule Take 50,000 Units by mouth once a week. Monday   02/25/2023     Vitals   Vitals:   03/03/23 2058 03/04/23 0654 03/04/23 0657 03/04/23 0839  BP: 125/79 (!) 164/87 (!) 164/87 (!) 152/82  Pulse: 75  86 91  Resp: 18  16 18   Temp: 100 F (37.8 C)  98.3 F (36.8 C) 98.6 F (37 C)  TempSrc: Oral   Oral  SpO2: 98%  98% 96%  Weight:       Height:         Body mass index is 36.12 kg/m.  Physical Exam   General: Laying comfortably in bed; in no acute distress.  HENT: Normal oropharynx and mucosa. Normal external appearance of ears and nose.  Neck: Supple, no pain or tenderness  CV: No JVD. No peripheral edema.  Pulmonary: Symmetric  Chest rise. Normal respiratory effort.  Abdomen: Soft to touch, non-tender.  Ext: No cyanosis, edema, or deformity  Skin: No rash. Normal palpation of skin.   Musculoskeletal: Normal digits and nails by inspection. No clubbing.   Neurologic Examination  Mental status/Cognition: Alert, oriented to self, place, month and year, good attention.  Speech/language: Fluent, comprehension intact, object naming intact, repetition intact.  Cranial nerves:   CN II Pupils equal and reactive to light, no VF deficits    CN III,IV,VI EOM intact, no gaze preference or deviation, no nystagmus    CN V normal sensation in V1, V2, and V3 segments bilaterally    CN VII no asymmetry, no nasolabial fold flattening   CN VIII normal hearing to speech    CN IX & X normal palatal elevation, no uvular deviation    CN XI 5/5 head turn and 5/5 shoulder shrug bilaterally    CN XII midline tongue protrusion   Motor:   Muscle bulk: normal, tone: normal, pronator drift: normal, tremor normal. Mvmt Root Nerve  Muscle Right Left Comments  SA C5/6 Ax Deltoid 5 5   EF C5/6 Mc Biceps 5 5   EE C6/7/8 Rad Triceps 5 5   WF C6/7 Med FCR     WE C7/8 PIN ECU     F Ab C8/T1 U ADM/FDI 5 5   HF L1/2/3 Fem Illopsoas 4+ 5   KE L2/3/4 Fem Quad 5 5   DF L4/5 D Peron Tib Ant 5 5   PF S1/2 Tibial Grc/Sol 5 5    Sensation:  Light touch Intact throughout   Pin prick    Temperature    Vibration   Proprioception    Coordination/Complex Motor:  - Finger to Nose intact bilaterally, ?slower on right side - Heel to shin intact with no ataxia - Rapid alternating movement intact bilaterally - Gait: deferred  Labs   CBC:   Recent Labs  Lab 02/27/23 1851 02/28/23 0412  WBC 7.7 8.1  NEUTROABS 4.3 4.0  HGB 14.6 12.5*  HCT 43.0 37.1*  MCV 79.2* 79.1*  PLT 420* 346    Basic Metabolic Panel:  Lab Results  Component Value Date   NA 136 03/04/2023   K 3.9 03/04/2023   CO2 21 (L) 03/04/2023   GLUCOSE 203 (H) 03/04/2023   BUN 27 (H) 03/04/2023   CREATININE 3.19 (H) 03/04/2023   CALCIUM 8.3 (L) 03/04/2023   GFRNONAA 25 (L) 03/04/2023   GFRAA  02/14/2008    >60        The eGFR has been calculated using the MDRD equation. This calculation has not been validated in all clinical   Lipid Panel: No results found for: "LDLCALC" HgbA1c:  Lab Results  Component Value Date   HGBA1C 13.8 (H) 02/28/2023   Urine Drug Screen: No results found for: "LABOPIA", "COCAINSCRNUR", "LABBENZ", "AMPHETMU", "THCU", "LABBARB"  Alcohol Level No results found for: "ETH"  CT Head without contrast(Personally reviewed): Normal  MRI Brain(Personally reviewed): - small acute infarct in the lateral right medulla - small subacute infarct in the deep right cerebral white mattere adjacent to the lateral ventricle.    Impression   [place] CHANCELLOR FINFROCK is a 37 y.o. male with PMH significant for HTN, CKD 3a, DM1 who presented to ED 8/21 c/o nausea and vomiting x 1 day, intermittent positional dizziness and R sided headache.  He was found to have an acute R medullary infarct along with a subacute deep right cerebral white matter  infarct  adjacent to lateral ventricle.  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  Secondary Diagnosis: Essential (primary) hypertension, Hypertension Emergency (SBP > 180 or DBP > 120 & end organ damage), Obesity, Acute Kidney Failure, and CKD Stage 3 (GFR 30-59)  Recommendations  - Frequent Neuro checks per stroke unit protocol - MRI Brain stroke protocol - Vascular imaging - CT Angio head and neck (ordered) - TTE - Lipid  panel   Statin will be started if LDL>70 or otherwise medically indicated - A1C - Antithrombotic - ASA 81mg  and Plavix daily for 3 weeks, then aspirin alone. (If there is intracranial stenosis seen on CTA, would recommend plavix x 3months) - DVT ppx - lovenox - Smoking cessation - will counsel patient - SBP goal - <220, PRN labetalol if HR>60 and PRN Hydralazine if HR<60 - Telemetry monitoring for arrhythmia - 72h - Swallow screen - will be performed prior to PO intake - Stroke education - will be given - PT/OT/SLP  Stroke team will follow.   ______________________________________________________________________     Pt seen by Neuro NP/APP and later by MD. Note/plan to be edited by MD as needed.    Lynnae January, DNP, AGACNP-BC [facility] Please use AMION for contact information & EPIC for messaging.  Thank you for the opportunity to take part in the care of this patient. If you have any further questions, please contact the neurology consultation attending.  NEUROHOSPITALIST ADDENDUM Performed a face to face diagnostic evaluation.   I have reviewed the contents of history and physical exam as documented by PA/ARNP/Resident and agree with above documentation.  I have discussed and formulated the above plan as documented. Edits to the note have been made as needed.  Impression/Key exam findings/Plan: the noted R medullary and small R periventricular white matter stroke on the right appears to likely be due to small vessel disease. Does have poorly controlled Diabetes and high blood pressure. Lipid panel is pending and so is full stroke workup. Stroke team to follow. Reports he was not able to afford his insulin pump after he started working for himself. Primary team to work on alternative regimen that he can afford as that is not in the scope of my practice.  Erick Blinks, MD [facility] [phone]   If 7pm to 7am, please call on call as listed on  AMION.

## 2023-03-04 NOTE — Inpatient Diabetes Management (Signed)
Inpatient Diabetes Program Recommendations  AACE/ADA: New Consensus Statement on Inpatient Glycemic Control (2015)  Target Ranges:  Prepandial:   less than 140 mg/dL      Peak postprandial:   less than 180 mg/dL (1-2 hours)      Critically ill patients:  140 - 180 mg/dL   Lab Results  Component Value Date   GLUCAP 250 (H) 03/04/2023   HGBA1C 13.8 (H) 02/28/2023     Diabetes history: DM type 1 Outpatient Diabetes medications: Lantus 40 units qhs, Humalog 0-30 units tid + hs Current orders for Inpatient glycemic control:  Levemir 12 units QHS Novolog 0-9 units tid + hs  Spoke with patient at bedside.  He has been on the Omnipod insulin pump and Freestyle Libre 2 in the past; approximately 1 year ago.  He lost his insurance at that time and was unable to afford his insulin pump and supplies.    He states he has been approved for Medicaid but has not received at received a card yet.  Asked pharmacy for a benefit check and Lantus and Novolog are covered at $4 each which implies his medicaid is active.    He used to see endocrinologist, Dr. Allena Katz with [facility] in [place].  He has not lately due to insurance. He would like to go back on his pump.  This would be ideal for his glucose control.    Will continue to follow while inpatient.  Thank you, Dulce Sellar, MSN, CDCES Diabetes Coordinator Inpatient Diabetes Program [phone] (team pager from 8a-5p)

## 2023-03-04 NOTE — Inpatient Diabetes Management (Signed)
Inpatient Diabetes Program Recommendations  AACE/ADA: New Consensus Statement on Inpatient Glycemic Control (2015)  Target Ranges:  Prepandial:   less than 140 mg/dL      Peak postprandial:   less than 180 mg/dL (1-2 hours)      Critically ill patients:  140 - 180 mg/dL   Lab Results  Component Value Date   GLUCAP 236 (H) 03/04/2023   HGBA1C 13.8 (H) 02/28/2023    Review of Glycemic Control  Diabetes history: DM type 1 Outpatient Diabetes medications: Lantus 40 units qhs, Humalog 0-30 units tid + hs Current orders for Inpatient glycemic control:  Levemir 12 units QHS Novolog 0-9 units tid + hs  Inpatient Diabetes Program Recommendations:    Please consider Levemir 15 units BID  Will continue to follow while inpatient.  Thank you, Dulce Sellar, MSN, CDCES Diabetes Coordinator Inpatient Diabetes Program [phone] (team pager from 8a-5p)

## 2023-03-04 NOTE — Evaluation (Signed)
Occupational Therapy Evaluation Patient Details Name: Bradley Davis MRN: [id number] DOB: 04/10/1986 Today's Date: 03/04/2023   History of Present Illness Pt presented initially to [facility] [place] with reports of nausea and vomiting for 2 days. Pt was unable to tolerate oral and was lightheaded. Pt was hypertensive on evaluation with elevated creatinine. Pt was admitted for AKI and CKD stage III. 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 PMH: DM 1, HTN, CKD III.   Clinical Impression   Bradley Davis was evaluated s/p the above admission list. 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 Upon evaluation the pt was limited by dizziness with sudden head turns, unsteady balance with significant LOBs. Overall he was CGA to stand and up to mod A for ambulation due to LOBs (R & posterior) with RW. Due to the deficits listed below the pt also needs up to mod A for ADLs in standing and set up A for ADLs in sitting. Vision and cognition screened, both WFL. Quick nystagmus elicited with R head turns, and pt report he has a "room spinning" sensation last night after turning in bed. Noted new R medullary stroke, awaiting neuro consult - would also recommend vestibular assessment.  Pt will benefit from continued acute OT services and intensive inpatient follow up therapy, >3 hours/day after discharge.         If plan is discharge home, recommend the following: A lot of help with walking and/or transfers;A lot of help with bathing/dressing/bathroom;Assistance with cooking/housework;Assist for transportation;Help with stairs or ramp for entrance    Functional Status Assessment  Patient has had a recent decline in their functional status and demonstrates the ability to make significant improvements in function in a reasonable and predictable amount of time.  Equipment Recommendations  Other (comment) (defer)    Recommendations for Other Services Rehab  consult     Precautions / Restrictions Precautions Precautions: Fall Restrictions Weight Bearing Restrictions: No      Mobility Bed Mobility Overal bed mobility: Modified Independent                  Transfers Overall transfer level: Needs assistance Equipment used: Rolling walker (2 wheels), 1 person hand held assist Transfers: Sit to/from Stand Sit to Stand: Contact guard assist           General transfer comment: CGA for STS, up to mod A needed for ambulation due to LOBs      Balance Overall balance assessment: Needs assistance Sitting-balance support: No upper extremity supported, Feet supported Sitting balance-Leahy Scale: Good     Standing balance support: Bilateral upper extremity supported, Reliant on assistive device for balance Standing balance-Leahy Scale: Poor                             ADL either performed or assessed with clinical judgement   ADL Overall ADL's : Needs assistance/impaired Eating/Feeding: Independent;Sitting   Grooming: Set up;Sitting Grooming Details (indicate cue type and reason): sitting for safety, mod A for LOB in standing Upper Body Bathing: Set up;Sitting   Lower Body Bathing: Moderate assistance;Sit to/from stand   Upper Body Dressing : Set up;Sitting   Lower Body Dressing: Moderate assistance;Sit to/from stand   Toilet Transfer: Moderate assistance;Rolling walker (2 wheels)   Toileting- Clothing Manipulation and Hygiene: Supervision/safety;Sitting/lateral lean       Functional mobility during ADLs: Moderate assistance;Rolling walker (2 wheels) General ADL  Comments: ADLs completed in sitting for safety, up to mod A needed in standing due to 2x significant LOBs     Vision Baseline Vision/History: 1 Wears glasses Vision Assessment?: No apparent visual deficits     Perception Perception: Not tested       Praxis Praxis: Not tested       Pertinent Vitals/Pain Pain Assessment Pain  Assessment: No/denies pain     Extremity/Trunk Assessment Upper Extremity Assessment Upper Extremity Assessment: Overall WFL for tasks assessed (MMT, ROM, coordination and dexterity was [facility])   Lower Extremity Assessment Lower Extremity Assessment: Defer to PT evaluation   Cervical / Trunk Assessment Cervical / Trunk Assessment: Normal   Communication Communication Communication: No apparent difficulties   Cognition Arousal: Alert Behavior During Therapy: WFL for tasks assessed/performed Overall Cognitive Status: Within Functional Limits for tasks assessed                                 General Comments: Overall WFL, anxious about new balance deficits     General Comments  VSS. Dizziness elicited with R head turn    Exercises     Shoulder Instructions      Home Living Family/patient expects to be discharged to:: Private residence Living Arrangements: Alone Available Help at Discharge: Family;Available PRN/intermittently Type of Home: Apartment Home Access: Level entry     Home Layout: One level     Bathroom Shower/Tub: Chief Strategy Officer: Standard     Home Equipment: None          Prior Functioning/Environment Prior Level of Function : Independent/Modified Independent;Driving;Working/employed             Mobility Comments: no AD ADLs Comments: indep, works as a Investment banker, 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        OT Problem List: Decreased strength;Decreased range of motion;Decreased activity tolerance;Impaired balance (sitting and/or standing);Decreased safety awareness;Decreased knowledge of use of DME or AE;Decreased knowledge of precautions      OT Treatment/Interventions: Self-care/ADL training;Therapeutic exercise;Neuromuscular education;DME and/or AE instruction;Therapeutic activities;Patient/family education;Balance training    OT Goals(Current goals can be found in the care plan section) Acute Rehab  OT Goals Patient Stated Goal: to figure out whats going on OT Goal Formulation: With patient Time For Goal Achievement: 03/18/23 Potential to Achieve Goals: Good  OT Frequency: Min 1X/week    Co-evaluation              AM-PAC OT "6 Clicks" Daily Activity     Outcome Measure Help from another person eating meals?: None Help from another person taking care of personal grooming?: A Little Help from another person toileting, which includes using toliet, bedpan, or urinal?: A Little Help from another person bathing (including washing, rinsing, drying)?: A Lot Help from another person to put on and taking off regular upper body clothing?: A Little Help from another person to put on and taking off regular lower body clothing?: A Lot 6 Click Score: 17   End of Session Equipment Utilized During Treatment: Gait belt;Rolling walker (2 wheels) Nurse Communication: Mobility status  Activity Tolerance: Patient tolerated treatment well Patient left: in chair;with call bell/phone within reach  OT Visit Diagnosis: Unsteadiness on feet (R26.81);Other abnormalities of gait and mobility (R26.89);Muscle weakness (generalized) (M62.81);Dizziness and giddiness (R42)                Time: 1442-1500 OT Time Calculation (min):  18 min Charges:  OT General Charges $OT Visit: 1 Visit OT Evaluation $OT Eval Moderate Complexity: 1 Mod  Derenda Mis, OTR/L Acute Rehabilitation Services Office [phone] Secure Chat Communication Preferred   Donia Pounds 03/04/2023, 3:21 PM

## 2023-03-04 NOTE — Progress Notes (Signed)
Physical Therapy Treatment Patient Details Name: Bradley Davis MRN: [id number] DOB: 1986/04/30 Today's Date: 03/04/2023   History of Present Illness 37 y.o. male presents to [facility] hospital on 02/27/2023 with nausea and vomiting for 2 days. Pt was unable to tolerate oral intake and was lightheaded. Pt was hypertensive on evaluation with elevated creatinine. Pt was admitted for AKI and CKD stage III. PMH: DM 1, HTN, CKD III.    PT Comments  Pt tolerates treatment well, with multiple trials of gait training, although pt does demonstrate a tendency for R lateral lean when standing and ambulating. Pt with multiple R losses of balance despite UE support of walker, which the pt is unable to correct without PT assistance. Pt denies dizziness, reports feeling instability. PT performs neuro screen without any notable focal deficits other than pt reports of R foot and leg feeling more fat than left side. PT will continue to follow in an effort to improve balance and reduce falls risk. PT recommend short term high intensity inpatient PT services at this time due to persistent balance deficits.    If plan is discharge home, recommend the following: A lot of help with walking and/or transfers;A lot of help with bathing/dressing/bathroom;Assistance with cooking/housework;Assist for transportation;Help with stairs or ramp for entrance   Can travel by private vehicle        Equipment Recommendations  Rolling walker (2 wheels);Wheelchair (measurements PT) (wheelchair necessary due to continued instability despite UE support)    Recommendations for Other Services Rehab consult     Precautions / Restrictions Precautions Precautions: Fall Restrictions Weight Bearing Restrictions: No     Mobility  Bed Mobility Overal bed mobility: Modified Independent                  Transfers Overall transfer level: Needs assistance Equipment used: Rolling walker (2 wheels), 1 person hand held assist Transfers:  Sit to/from Stand Sit to Stand: Contact guard assist           General transfer comment: requires UE support for stability    Ambulation/Gait Ambulation/Gait assistance: Mod assist Gait Distance (Feet): 30 Feet (30' x 2 trials, additional 15' x 2) Assistive device: Rolling walker (2 wheels), None Gait Pattern/deviations: Step-to pattern, Staggering right Gait velocity: reduced Gait velocity interpretation: <1.8 ft/sec, indicate of risk for recurrent falls   General Gait Details: pt with consistent rightward loss of balance, ambulating to bathroom with BUE support of wall and later with RW. Pt loses balance to R sides 5-6 times during session and is unable to correct without PT assistance   Stairs             Wheelchair Mobility     Tilt Bed    Modified Rankin (Stroke Patients Only)       Balance Overall balance assessment: Needs assistance Sitting-balance support: No upper extremity supported, Feet supported Sitting balance-Leahy Scale: Good     Standing balance support: Bilateral upper extremity supported, Reliant on assistive device for balance Standing balance-Leahy Scale: Poor Standing balance comment: minG-minA with UE support of RW                            Cognition Arousal: Alert Behavior During Therapy: WFL for tasks assessed/performed Overall Cognitive Status: Within Functional Limits for tasks assessed  Exercises      General Comments General comments (skin integrity, edema, etc.): VSS on RA, orthostatic vitals documented in vitals flowsheet      Pertinent Vitals/Pain Pain Assessment Pain Assessment: No/denies pain    Home Living                          Prior Function            PT Goals (current goals can now be found in the care plan section) Acute Rehab PT Goals Patient Stated Goal: To return home Progress towards PT goals: Not progressing toward  goals - comment (persistent balance deficits)    Frequency    Min 1X/week      PT Plan      Co-evaluation              AM-PAC PT "6 Clicks" Mobility   Outcome Measure  Help needed turning from your back to your side while in a flat bed without using bedrails?: None Help needed moving from lying on your back to sitting on the side of a flat bed without using bedrails?: None Help needed moving to and from a bed to a chair (including a wheelchair)?: A Little Help needed standing up from a chair using your arms (e.g., wheelchair or bedside chair)?: A Little Help needed to walk in hospital room?: A Lot Help needed climbing 3-5 steps with a railing? : Total 6 Click Score: 17    End of Session Equipment Utilized During Treatment: Gait belt Activity Tolerance: Patient tolerated treatment well Patient left: in bed;with call bell/phone within reach;with bed alarm set Nurse Communication: Mobility status PT Visit Diagnosis: Unsteadiness on feet (R26.81);Other abnormalities of gait and mobility (R26.89)     Time: 0981-1914 PT Time Calculation (min) (ACUTE ONLY): 43 min  Charges:    $Gait Training: 23-37 mins $Therapeutic Activity: 8-22 mins PT General Charges $$ ACUTE PT VISIT: 1 Visit                     Arlyss Gandy, PT, DPT Acute Rehabilitation Office [phone]    Arlyss Gandy 03/04/2023, 10:03 AM

## 2023-03-05 ENCOUNTER — Inpatient Hospital Stay (HOSPITAL_COMMUNITY): Payer: Medicaid Other

## 2023-03-05 DIAGNOSIS — N1831 Chronic kidney disease, stage 3a: Secondary | ICD-10-CM | POA: Diagnosis not present

## 2023-03-05 DIAGNOSIS — I6389 Other cerebral infarction: Secondary | ICD-10-CM

## 2023-03-05 DIAGNOSIS — I639 Cerebral infarction, unspecified: Secondary | ICD-10-CM

## 2023-03-05 DIAGNOSIS — N179 Acute kidney failure, unspecified: Secondary | ICD-10-CM | POA: Diagnosis not present

## 2023-03-05 LAB — ECHOCARDIOGRAM COMPLETE BUBBLE STUDY
Area-P 1/2: 3.06 cm2
Height: 70 in
S' Lateral: 3.8 cm
Weight: 4027.2 [oz_av]

## 2023-03-05 LAB — GLUCOSE, CAPILLARY
Glucose-Capillary: 201 mg/dL — ABNORMAL HIGH (ref 70–99)
Glucose-Capillary: 190 mg/dL — ABNORMAL HIGH (ref 70–99)
Glucose-Capillary: 207 mg/dL — ABNORMAL HIGH (ref 70–99)
Glucose-Capillary: 209 mg/dL — ABNORMAL HIGH (ref 70–99)

## 2023-03-05 LAB — LIPID PANEL
Cholesterol: 193 mg/dL (ref 0–200)
HDL: 41 mg/dL (ref >40)
LDL Cholesterol: 130 mg/dL — ABNORMAL HIGH (ref 0–99)
Total CHOL/HDL Ratio: 4.7 ratio
Triglycerides: 110 mg/dL (ref <150)
VLDL: 22 mg/dL (ref 0–40)

## 2023-03-05 LAB — BASIC METABOLIC PANEL
Anion gap: 10 (ref 5–15)
BUN: 32 mg/dL — ABNORMAL HIGH (ref 6–20)
CO2: 20 mmol/L — ABNORMAL LOW (ref 22–32)
Calcium: 8.8 mg/dL — ABNORMAL LOW (ref 8.9–10.3)
Chloride: 108 mmol/L (ref 98–111)
Creatinine, Ser: 3.05 mg/dL — ABNORMAL HIGH (ref 0.61–1.24)
GFR, Estimated: 26 mL/min — ABNORMAL LOW (ref >60)
Glucose, Bld: 225 mg/dL — ABNORMAL HIGH (ref 70–99)
Potassium: 4.1 mmol/L (ref 3.5–5.1)
Sodium: 138 mmol/L (ref 135–145)

## 2023-03-05 MED ORDER — STROKE: EARLY STAGES OF RECOVERY BOOK
Freq: Once | Status: AC
  Filled 2023-03-05: qty 1

## 2023-03-05 NOTE — Plan of Care (Signed)
  Problem: Metabolic: Goal: Ability to maintain appropriate glucose levels will improve Outcome: Progressing   Problem: Nutritional: Goal: Maintenance of adequate nutrition will improve Outcome: Progressing   

## 2023-03-05 NOTE — Progress Notes (Addendum)
PROGRESS NOTE  Bradley Davis  ZOX:[id number] DOB: 08/27/1985 DOA: 02/27/2023 PCP: [name], FNP   Brief Narrative: Patient is a 37 year old male with history of diabetes type 1, hypertension, CKD stage IIIa with baseline creatinine of 1.5-1.9 who presented to [facility] with complaint of nausea and vomiting for 2 days, unable to tolerate oral, lightheadedness.  On presentation, he was hypertensive.  Lab work showed creatinine of 3.31.  Patient was admitted for the management of AKI and CKD stage IIIa and was started on IV fluids. During physical therapy assessment, became dizzy, found to have high risk for falls.  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  Neurology consulted, stroke workup initiated.  PT recommending acute inpatient rehab.    Assessment & Plan:  Principal Problem:   Acute renal failure superimposed on stage 3a chronic kidney disease (HCC) Active Problems:   DM (diabetes mellitus), type 1 with renal complications (HCC)   Essential hypertension   Intractable nausea and vomiting   AKI on CKD stage IIIa: Baseline creatinine around 2.  Follows with Dr. Lequita Halt  at [facility] .He presented with complaint of nausea, vomiting, unable to tolerate oral .  Patient taking benazepril at home.  Currently on hold.  Renal ultrasound was unremarkable for acute changes.  Creatinine has plateaued  round 3.  This might be his new baseline.  He does not look volume overloaded.  Urine sodium more than 20.  We recommend to follow-up with his nephrologist at [facility].  He got tons of fluid in this hospitalization, now on hold  Intractable nausea/vomiting: 2-3 days history of intractable nausea and vomiting.  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  He feels better after starting on Reglan.  Diet advanced to solid  Acute ischemic stroke/dizziness/tendency for falls: Patient became dizzy, lightheaded during physical therapy  evaluation.  At baseline, he is ambulatory without any problem.  During repeat physical therapy assessment on 9/26, he demonstrated tendency for right lateral lean, loss of balance.  CT head did not show any acute findings. 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  Neurology consulted, stroke workup initiated.  MRA of head and neck did not show any acute findings.  Started on aspirin and Plavix.  Echo pending. Continue meclizine for dizziness. LDL of 130, start on Lipitor  Type 1 diabetes:  Follows with endocrinology. .  Continue current insulin regimen.  Diabetes is uncontrolled.  Recent A1c of 13.8.  Consulted diabetic coordinator, recommendation for Lantus and novolog  on discharge.  Hypertension: Severely hypertensive on presentation.  Home amlodipine, hydralazine restarted.  Benazepril on hold.  Started on labetalol.  Blood pressure better  Morbid obesity: BMI 36.1      DVT prophylaxis:SCDs Start: 02/27/23 2341     Code Status: Full Code  Family Communication: None at the bedside  Patient status: Inpatient  Patient is from : Home  Anticipated discharge to:  AIR  Estimated DC date: 1 to 2 days  Consultants: None  Procedures: None  Antimicrobials:  Anti-infectives (From admission, onward)    None       Subjective: Patient seen and examined at bedside today.  Hemodynamically stable.  Lying in bed.  Denies nausea or vomiting today.  Tolerating carb consistent diet.  Denied dizziness while lying in bed.   Objective: Vitals:   03/04/23 1552 03/04/23 2121 03/05/23 0604 03/05/23 0805  BP: (!) 169/94 (!) 163/92 (!) 145/87 (!) 149/82  Pulse: 92 92 95 90  Resp: 18 19 19 20   Temp: 98.1 F (36.7 C) 97.9 F (36.6 C) 98.2 F (36.8 C) 98.7 F (37.1 C)  TempSrc:  Oral Oral   SpO2: 97% 100% 94% 96%  Weight:      Height:        Intake/Output Summary (Last 24 hours) at 03/05/2023 1132 Last data filed at 03/05/2023  1125 Gross per 24 hour  Intake 480 ml  Output 4000 ml  Net -3520 ml   Filed Weights   02/27/23 1719 02/27/23 2321  Weight: 104.3 kg 114.2 kg    Examination:  General exam: Overall comfortable, not in distress, morbidly obese HEENT: PERRL Respiratory system:  no wheezes or crackles  Cardiovascular system: S1 & S2 heard, RRR.  Gastrointestinal system: Abdomen is nondistended, soft and nontender. Central nervous system: Alert and oriented Extremities: No edema, no clubbing ,no cyanosis Skin: No rashes, no ulcers,no icterus    Data Reviewed: I have personally reviewed following labs and imaging studies  CBC: Recent Labs  Lab 02/27/23 1851 02/28/23 0412  WBC 7.7 8.1  NEUTROABS 4.3 4.0  HGB 14.6 12.5*  HCT 43.0 37.1*  MCV 79.2* 79.1*  PLT 420* 346   Basic Metabolic Panel: Recent Labs  Lab 02/28/23 0412 03/01/23 0721 03/02/23 0455 03/03/23 0633 03/04/23 0454 03/05/23 1005  NA 132* 135 136 135 136 138  K 3.5 4.2 3.9 3.7 3.9 4.1  CL 103 104 106 107 107 108  CO2 20* 20* 20* 20* 21* 20*  GLUCOSE 247* 317* 218* 222* 203* 225*  BUN 39* 34* 30* 27* 27* 32*  CREATININE 3.09* 3.22* 3.17* 3.07* 3.19* 3.05*  CALCIUM 8.2* 8.7* 8.3* 8.6* 8.3* 8.8*  MG 1.9  --   --   --   --   --      Recent Results (from the past 240 hour(s))  Resp panel by RT-PCR (RSV, Flu A&B, Covid) Anterior Nasal Swab     Status: None   Collection Time: 02/27/23  5:22 PM   Specimen: Anterior Nasal Swab  Result Value Ref Range Status   SARS Coronavirus 2 by RT PCR NEGATIVE NEGATIVE Final    Comment: (NOTE) SARS-CoV-2 target nucleic acids are NOT DETECTED.  The SARS-CoV-2 RNA is generally detectable in upper respiratory specimens during the acute phase of infection. The lowest concentration of SARS-CoV-2 viral copies this assay can detect is 138 copies/mL. A negative result does not preclude SARS-Cov-2 infection and should not be used as the sole basis for treatment or other patient management  decisions. A negative result may occur with  improper specimen collection/handling, submission of specimen other than nasopharyngeal swab, presence of viral mutation(s) within the areas targeted by this assay, and inadequate number of viral copies(<138 copies/mL). A negative result must be combined with clinical observations, patient history, and epidemiological information. The expected result is Negative.  Fact Sheet for Patients:  BloggerCourse.com  Fact Sheet for Healthcare Providers:  SeriousBroker.it  This test is no t yet approved or cleared by the [place] FDA and  has been authorized for detection and/or diagnosis of SARS-CoV-2 by FDA under an Emergency Use Authorization (EUA). This EUA will remain  in effect (meaning this test can be used) for the duration of the COVID-19 declaration under Section 564(b)(1) of the Act, 21 U.S.C.section 360bbb-3(b)(1), unless the authorization is terminated  or revoked sooner.       Influenza A by PCR NEGATIVE NEGATIVE Final   Influenza B by  PCR NEGATIVE NEGATIVE Final    Comment: (NOTE) The Xpert Xpress SARS-CoV-2/FLU/RSV plus assay is intended as an aid in the diagnosis of influenza from Nasopharyngeal swab specimens and should not be used as a sole basis for treatment. Nasal washings and aspirates are unacceptable for Xpert Xpress SARS-CoV-2/FLU/RSV testing.  Fact Sheet for Patients: BloggerCourse.com  Fact Sheet for Healthcare Providers: SeriousBroker.it  This test is not yet approved or cleared by the [place] FDA and has been authorized for detection and/or diagnosis of SARS-CoV-2 by FDA under an Emergency Use Authorization (EUA). This EUA will remain in effect (meaning this test can be used) for the duration of the COVID-19 declaration under Section 564(b)(1) of the Act, 21 U.S.C. section 360bbb-3(b)(1), unless the  authorization is terminated or revoked.     Resp Syncytial Virus by PCR NEGATIVE NEGATIVE Final    Comment: (NOTE) Fact Sheet for Patients: BloggerCourse.com  Fact Sheet for Healthcare Providers: SeriousBroker.it  This test is not yet approved or cleared by the [place] FDA and has been authorized for detection and/or diagnosis of SARS-CoV-2 by FDA under an Emergency Use Authorization (EUA). This EUA will remain in effect (meaning this test can be used) for the duration of the COVID-19 declaration under Section 564(b)(1) of the Act, 21 U.S.C. section 360bbb-3(b)(1), unless the authorization is terminated or revoked.  Performed at [facility], [address] [place], [place] [address]      Radiology Studies: MR ANGIO HEAD WO CONTRAST  Result Date: 03/04/2023 CLINICAL DATA:  Stroke follow-up EXAM: MRA NECK WITHOUT CONTRAST MRA HEAD WITHOUT CONTRAST TECHNIQUE: Angiographic images of the Circle of Willis were acquired using MRA technique without intravenous contrast. COMPARISON:  None Available. FINDINGS: MRA NECK FINDINGS Aortic arch: Normal Right carotid system: Normal Left carotid system: Normal Vertebral arteries: Right dominant.  Patent with antegrade flow Other: None. MRA HEAD FINDINGS POSTERIOR CIRCULATION: --Vertebral arteries: Normal --Inferior cerebellar arteries: Normal. --Basilar artery: Normal. --Superior cerebellar arteries: Normal. --Posterior cerebral arteries: Normal. ANTERIOR CIRCULATION: --Intracranial internal carotid arteries: Normal. --Anterior cerebral arteries (ACA): Normal. --Middle cerebral arteries (MCA): Normal. Other: None. IMPRESSION: Normal MRA of the head and neck. Electronically Signed   By: Deatra Robinson M.D.   On: 03/04/2023 23:01   MR ANGIO NECK WO CONTRAST  Result Date: 03/04/2023 CLINICAL DATA:  Stroke follow-up EXAM: MRA NECK WITHOUT CONTRAST MRA HEAD WITHOUT CONTRAST TECHNIQUE:  Angiographic images of the Circle of Willis were acquired using MRA technique without intravenous contrast. COMPARISON:  None Available. FINDINGS: MRA NECK FINDINGS Aortic arch: Normal Right carotid system: Normal Left carotid system: Normal Vertebral arteries: Right dominant.  Patent with antegrade flow Other: None. MRA HEAD FINDINGS POSTERIOR CIRCULATION: --Vertebral arteries: Normal --Inferior cerebellar arteries: Normal. --Basilar artery: Normal. --Superior cerebellar arteries: Normal. --Posterior cerebral arteries: Normal. ANTERIOR CIRCULATION: --Intracranial internal carotid arteries: Normal. --Anterior cerebral arteries (ACA): Normal. --Middle cerebral arteries (MCA): Normal. Other: None. IMPRESSION: Normal MRA of the head and neck. Electronically Signed   By: Deatra Robinson M.D.   On: 03/04/2023 23:01   MR BRAIN WO CONTRAST  Result Date: 03/04/2023 CLINICAL DATA:  Syncope/presyncope with cerebrovascular cause suspected EXAM: MRI HEAD WITHOUT CONTRAST TECHNIQUE: Multiplanar, multiecho pulse sequences of the brain and surrounding structures were obtained without intravenous contrast. COMPARISON:  Head CT from yesterday FINDINGS: Brain: 8 mm area of restricted diffusion in the lateral right medulla. Tiny area of more week diffusion hyperintensity along the body of the right lateral ventricle. Although demyelinating process is considered given  the patient's young age in the distribution, this is more consistent with small vessel ischemia given patient's history of diabetes, hypertension, and renal failure. No acute hemorrhage, hydrocephalus, masslike finding, or collection. Mild chronic white matter disease in the bilateral cerebral hemisphere. Vascular: Major flow voids are preserved, including the right V4 segment. Skull and upper cervical spine: Normal marrow signal Sinuses/Orbits: Unremarkable Other: Posterior scalp scarring. IMPRESSION: 1. Small acute infarct in the lateral right medulla. 2. Small  subacute infarct in the deep right cerebral white matter adjacent to the lateral ventricle. 3. Mild chronic white matter disease. Electronically Signed   By: Tiburcio Pea M.D.   On: 03/04/2023 11:03   CT HEAD WO CONTRAST ( )  Result Date: 03/03/2023 CLINICAL DATA:  Syncope/presyncope, cerebrovascular cause suspected. EXAM: CT HEAD WITHOUT CONTRAST TECHNIQUE: Contiguous axial images were obtained from the base of the skull through the vertex without intravenous contrast. RADIATION DOSE REDUCTION: This exam was performed according to the departmental dose-optimization program which includes automated exposure control, adjustment of the mA and/or kV according to patient size and/or use of iterative reconstruction technique. COMPARISON:  CT head without contrast 12/14/2016 at [facility]. FINDINGS: Brain: No acute infarct, hemorrhage, or mass lesion is present. No significant white matter lesions are present. Deep brain nuclei are within normal limits. The ventricles are of normal size. No significant extraaxial fluid collection is present. The brainstem and cerebellum are within normal limits. Midline structures are within normal limits. Vascular: No hyperdense vessel or unexpected calcification. Skull: Calvarium is intact. Scalp soft tissue swelling is present posteriorly and near the vertex. No underlying fracture is present. Sinuses/Orbits: The paranasal sinuses and mastoid air cells are clear. The globes and orbits are within normal limits. IMPRESSION: 1. Scalp soft tissue swelling posteriorly and near the vertex without underlying fracture. This appears to represent an inflammatory or infectious process rather than trauma. 2. Normal CT appearance of the brain. Electronically Signed   By: Marin Roberts M.D.   On: 03/03/2023 16:24    Scheduled Meds:  amLODipine  10 mg Oral Daily   aspirin EC  81 mg Oral Daily   atorvastatin  80 mg Oral Daily   clopidogrel  75 mg Oral Daily    hydrALAZINE  100 mg Oral Q8H   insulin aspart  0-5 Units Subcutaneous QHS   insulin aspart  0-9 Units Subcutaneous TID WC   insulin detemir  15 Units Subcutaneous BID   labetalol  300 mg Oral Q8H   polyethylene glycol  17 g Oral Daily   senna  1 tablet Oral BID   sodium bicarbonate  650 mg Oral BID   Continuous Infusions:     LOS: 6 days   Burnadette Pop, MD [facility] P8/27/2024, 11:32 AM

## 2023-03-05 NOTE — Progress Notes (Addendum)
STROKE TEAM PROGRESS NOTE   BRIEF HPI Bradley Davis is a 37 y.o. male with PMH significant for HTN, CKD 3a, DM1 who presented to ED 8/21 c/o nausea and vomiting x 1 day, intermittent positional dizziness and R sided headache.     SIGNIFICANT HOSPITAL EVENTS 8/21 presented to ED to hospital  INTERIM HISTORY/SUBJECTIVE Last Wednesday, the patient reported having nausea and then projective vomiting. Additionally, he reported R sided headache and dizziness (vertigo>lightheaded). He denies diplopia, incoordination, or weakness. With regard to current symptoms, he reports continued issues with nausea, headache, and vertigo -- all worsened by standing. He is also currently reporting hiccup, which we confirmed is consistent with his stroke location. He reports adherence to his insulin but reports that he cannot afford his CGM, so he cannot follow his sugars as well as before. 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 Britta Mccreedy, the rehab [facility], was in the room with the patient when we arrived and advocated that the patient receive CIR prior to discharge home, which we agreed with given the symptom burden and affect on functioning. Report adherence with his statin, and we discussed getting the patient on Leqvio due to the significant dyslipidemia despite max statin (atrovastatin 80). Pt has signed the prior approval. Furthermore, we discussed that he is high risk for sleep apnea, which increases risk for stroke, and he was agreeable to getting more information about the Stroke x Sleep apnea trial -- qualifies for study participation with an NIHSS 2 currently and less than one week from stroke onset (tomrrow is one week).    OBJECTIVE  CBC    Component Value Date/Time   WBC 8.1 02/28/2023 0412   RBC 4.69 02/28/2023 0412   HGB 12.5 (L) 02/28/2023 0412   HCT 37.1 (L) 02/28/2023 0412   PLT 346 02/28/2023 0412   MCV 79.1 (L) 02/28/2023 0412   MCV 87.1 01/19/2012 1310   MCH 26.7 02/28/2023 0412   MCHC  33.7 02/28/2023 0412   RDW 13.2 02/28/2023 0412   LYMPHSABS 3.2 02/28/2023 0412   MONOABS 0.6 02/28/2023 0412   EOSABS 0.3 02/28/2023 0412   BASOSABS 0.0 02/28/2023 0412    BMET    Component Value Date/Time   NA 138 03/05/2023 1005   K 4.1 03/05/2023 1005   CL 108 03/05/2023 1005   CO2 20 (L) 03/05/2023 1005   GLUCOSE 225 (H) 03/05/2023 1005   BUN 32 (H) 03/05/2023 1005   CREATININE 3.05 (H) 03/05/2023 1005   CALCIUM 8.8 (L) 03/05/2023 1005   GFRNONAA 26 (L) 03/05/2023 1005    IMAGING past 24 hours MR ANGIO HEAD WO CONTRAST  Result Date: 03/04/2023 CLINICAL DATA:  Stroke follow-up EXAM: MRA NECK WITHOUT CONTRAST MRA HEAD WITHOUT CONTRAST TECHNIQUE: Angiographic images of the Circle of Willis were acquired using MRA technique without intravenous contrast. COMPARISON:  None Available. FINDINGS: MRA NECK FINDINGS Aortic arch: Normal Right carotid system: Normal Left carotid system: Normal Vertebral arteries: Right dominant.  Patent with antegrade flow Other: None. MRA HEAD FINDINGS POSTERIOR CIRCULATION: --Vertebral arteries: Normal --Inferior cerebellar arteries: Normal. --Basilar artery: Normal. --Superior cerebellar arteries: Normal. --Posterior cerebral arteries: Normal. ANTERIOR CIRCULATION: --Intracranial internal carotid arteries: Normal. --Anterior cerebral arteries (ACA): Normal. --Middle cerebral arteries (MCA): Normal. Other: None. IMPRESSION: Normal MRA of the head and neck. Electronically Signed   By: Deatra Robinson M.D.   On: 03/04/2023 23:01   MR ANGIO NECK WO CONTRAST  Result Date: 03/04/2023 CLINICAL DATA:  Stroke follow-up EXAM: MRA  NECK WITHOUT CONTRAST MRA HEAD WITHOUT CONTRAST TECHNIQUE: Angiographic images of the Circle of Willis were acquired using MRA technique without intravenous contrast. COMPARISON:  None Available. FINDINGS: MRA NECK FINDINGS Aortic arch: Normal Right carotid system: Normal Left carotid system: Normal Vertebral arteries: Right dominant.  Patent  with antegrade flow Other: None. MRA HEAD FINDINGS POSTERIOR CIRCULATION: --Vertebral arteries: Normal --Inferior cerebellar arteries: Normal. --Basilar artery: Normal. --Superior cerebellar arteries: Normal. --Posterior cerebral arteries: Normal. ANTERIOR CIRCULATION: --Intracranial internal carotid arteries: Normal. --Anterior cerebral arteries (ACA): Normal. --Middle cerebral arteries (MCA): Normal. Other: None. IMPRESSION: Normal MRA of the head and neck. Electronically Signed   By: Deatra Robinson M.D.   On: 03/04/2023 23:01    Vitals:   03/04/23 1552 03/04/23 2121 03/05/23 0604 03/05/23 0805  BP: (!) 169/94 (!) 163/92 (!) 145/87 (!) 149/82  Pulse: 92 92 95 90  Resp: 18 19 19 20   Temp: 98.1 F (36.7 C) 97.9 F (36.6 C) 98.2 F (36.8 C) 98.7 F (37.1 C)  TempSrc:  Oral Oral   SpO2: 97% 100% 94% 96%  Weight:      Height:         PHYSICAL EXAM General:  Alert, well-nourished, well-developed patient in no acute distress Psych:  Mood and affect appropriate for situation CV: Regular rate and rhythm on monitor Respiratory:  Regular, unlabored respirations on room air GI: Abdomen soft and nontender   NEURO:  Mental Status: AA&Ox3, patient is able to give clear and coherent history Speech/Language: speech is without dysarthria or aphasia.  Naming, repetition, fluency, and comprehension intact.  Cranial Nerves:  II: PERRL. Visual fields full.  III, IV, VI: EOMI. Eyelids elevate symmetrically.  V: R facial numbness compared to L.   VII: L lower facial weakness compared to R.  VIII: hearing intact to voice. IX, X: Palate elevates symmetrically. Phonation is normal.  ZO:[id number] shrug 5/5. XII: tongue is midline without fasciculations. Motor: 5/5 strength to all muscle groups tested.  Tone: is normal and bulk is normal Sensation- Intact to light touch bilaterally, except R facial numbness. Extinction absent to light touch to DSS.   Coordination: FTN intact bilaterally, HKS: no ataxia  in BLE.No drift.  Gait- deferred  NIHSS: 2 for L facial droop and R numbness.   ASSESSMENT/PLAN  Acute Ischemic Infarct:  left lateral medulla (outside timeline for treatment) Etiology:  small vessel disease secondary to uncontrolled HLD, DM, HTN CT head No acute abnormality except for scalp soft tissue swelling without fracture.    MRI  small acute infract in the lateral R medulla. Small subacute infarct in the deep R cerebral white matter.  MRA (head/neck)  normal 2D Echo pending LDL 130 HgbA1c 13.8 VTE prophylaxis - SCDs No antithrombotic prior to admission, now on aspirin 81 mg daily and clopidogrel 75 mg daily for 3 weeks and then aspirin alone. Therapy recommendations:  CIR, pending insurance Disposition:  pending  Hypertension Home meds:  amlodipine 10 mg once daily, hydrochlorothiazide 25 mg once daily Stable but hypertensive BP goal: normotensive  Hyperlipidemia Home meds:  atorvastatin 80 mg once daily, resumed in hospital LDL 130, goal < 70 On maximum statin and not at goal, sent prior authorization for Leqvio infusion  Continue statin at discharge  Diabetes type II Uncontrolled Home meds:  insulin glargine 40 units once at bedtime, insulin lispro 1-30 units per sliding scale three times daily with meals HgbA1c 13.8, goal < 7.0 Followed by endocrinology and PCP.  Unable to afford CGM for the last  year.  Diabetes RN consulted.  Recommend close follow-up with PCP for better DM control  Dysphagia Patient has post-stroke dysphagia, SLP consulted    Diet   Diet Carb Modified Fluid consistency: Thin; Room service appropriate? Yes   Advance diet as tolerated  Other Stroke Risk Factors ETOH use, advised to drink no more than 2 drinks a day Obesity, Body mass index is 36.12 kg/m., BMI >/= 30 associated with increased stroke risk, recommend weight loss, diet and exercise as appropriate  High risk for Obstructive sleep apnea but no formal past assessment, discussed  Stroke x OSA clinical trial with patient.   Other Active Problems AKI on CKD, Cr ~3 (baseline ~2), primary team following Nausea/vomiting, on reglan  Hospital day # 6  Meryl Dare, MD PGY-1 Psychiatry Resident 03/05/2023, 1:50 PM  STROKE MD NOTE :  I have personally obtained history,examined this patient, reviewed notes, independently viewed imaging studies, participated in medical decision making and plan of care.ROS completed by me personally and pertinent positives fully documented  I have made any additions or clarifications directly to the above note. Agree with note above.  Patient presented with dizziness, nausea vomiting and headache and MRI shows right lateral medullary infarct.  Recommend continue ongoing stroke workup.  Physical Occupational Therapy and rehab consults.  Aspirin and Plavix for 3 weeks followed by aspirin alone and aggressive risk factor modification.  Patient also appears to be at risk for sleep apnea and may benefit with consideration for participation in the sleep smart stroke prevention study.  Patient was given written information to review and decide.  It was made clear patient that his participation is voluntary and moreover he can withdraw participation from the study at any point in the future if he is not satisfied.  Discussed with Dr. Renford Dills.  Greater than 50% time during this 50-minute visit was spent on counseling and coordination of care about his stroke and discussion about evaluation and treatment and prevention and answering questions. Delia Heady, MD Medical Director [facility] Pager: [phone] 03/05/2023 5:54 PM   To contact Stroke Continuity provider, please refer to [email or web address]. After hours, contact General Neurology

## 2023-03-05 NOTE — PMR Pre-admission (Signed)
PMR Admission Coordinator Pre-Admission Assessment  Patient: Bradley Davis is an 37 y.o., male MRN: [id number] DOB: 1985/09/24 Height: 5\' 10"  (177.8 cm) Weight: 114.2 kg              Insurance Information HMO:     PPO:      PCP:      IPA:      80/20:      OTHER:  PRIMARY: [employer]      Policy#: [id number] medicaid # [id number]      Subscriber: pt CM Name: Rex Kras      Phone#: [phone]     Fax#: [phone] Pre-Cert#: [id number]  approved for 7 days 8/30 until 03/15/23    Employer:  Benefits:  Phone #: [phone]     Name: 8/27 Eff. Date: 01/07/23     Deduct: none      Out of Pocket Max: none      Life Max: none  CIR: per medicaid      SNF: per medicaid Outpatient: per medicaid     Co-Pay:  Home Health: per medicaid      Co-Pay:  DME: per medicaid     Co-Pay:  Providers: in network  SECONDARY: none      Policy#:       Phone#:   Artist:       Phone#:   The Data processing manager" for patients in Inpatient Rehabilitation Facilities with attached "Privacy Act Statement-Health Care Records" was provided and verbally reviewed with: N/A  Emergency Contact Information Contact Information     Name Relation Home Work Mobile   Joiner,Annette Mother [phone]        Other Contacts   None on File    Current Medical History  Patient Admitting Diagnosis: CVA  History of Present Illness: Bradley Davis is a 37 y.o. L handed male with hx of DM1 with A1c of 13.8; HTN, morbid obesity with BMI 36.1, prior CKD 3a with Cr 1.5-1.9 and HLD admitted 02/27/23 with inability to tolerate PO, lightheadedness and dizziness from [facility]- he was found to have a Cr of 3.31 and admitted to [facility] for AKI- however after a lot of IVF's and pushing PO, didn't really improve- it was thought to be his new baseline. He was also clinically diagnosed with DM gastroparesis and started on Reglan IV- he's now receiving prn.  On 03/04/23, he also started  experiencing loss of balance and posterior lean- therapy was concerned about stroke and CT was done- was negative, however had MRI and showed R medullary infarct and subacute R cerebral white matter infarct.    LDL 131- on max statin.A1c 13.8.Also had severe HTN upon admit and Norvasc and Hydralazine maxed out and restarted. - and ACEI stopped due to Cr however labetalol added- BP has been better.    Pt was started on DAPT; and has ativan for refractive nausea and Antivert for dizziness and Reglan 10 mg IV prn for gastroparesis. Ortho stasis noted with up activity.  Patient's medical record from [facility] has been reviewed by the rehabilitation admission coordinator and physician.  Past Medical History  Past Medical History:  Diagnosis Date   CKD stage 3a, GFR 45-59 ml/min (HCC)    Diabetes mellitus    Hypertension    Has the patient had major surgery during 100 days prior to admission? No  Family History  family history is not on file.  Current Medications   Current Facility-Administered  Medications:    acetaminophen (TYLENOL) tablet 1,000 mg, 1,000 mg, Oral, Q6H PRN, 1,000 mg at 03/07/23 2121 **OR** acetaminophen (TYLENOL) suppository 650 mg, 650 mg, Rectal, Q6H PRN, Howerter, Justin B, DO   amLODipine (NORVASC) tablet 10 mg, 10 mg, Oral, Daily, Howerter, Justin B, DO, 10 mg at 03/08/23 0900   aspirin EC tablet 81 mg, 81 mg, Oral, Daily, Richardo Priest, Erin C, NP, 81 mg at 03/08/23 0900   atorvastatin (LIPITOR) tablet 80 mg, 80 mg, Oral, Daily, Howerter, Justin B, DO, 80 mg at 03/08/23 0900   clopidogrel (PLAVIX) tablet 75 mg, 75 mg, Oral, Daily, Pearlean Brownie, Pramod S, MD, 75 mg at 03/08/23 0900   hydrALAZINE (APRESOLINE) tablet 100 mg, 100 mg, Oral, Q8H, Howerter, Justin B, DO, 100 mg at 03/08/23 0532   insulin aspart (novoLOG) injection 0-5 Units, 0-5 Units, Subcutaneous, QHS, Howerter, Justin B, DO, 2 Units at 03/06/23 2230   insulin aspart (novoLOG) injection 0-9 Units, 0-9 Units,  Subcutaneous, TID WC, Howerter, Justin B, DO, 2 Units at 03/08/23 0900   insulin aspart (novoLOG) injection 2 Units, 2 Units, Subcutaneous, TID WC, Adhikari, Amrit, MD, 2 Units at 03/08/23 0901   insulin detemir (LEVEMIR) injection 20 Units, 20 Units, Subcutaneous, BID, Adhikari, Amrit, MD, 20 Units at 03/08/23 0900   labetalol (NORMODYNE) injection 10 mg, 10 mg, Intravenous, Q2H PRN, Burnadette Pop, MD, 10 mg at 03/01/23 0657   labetalol (NORMODYNE) tablet 300 mg, 300 mg, Oral, Q8H, Adhikari, Amrit, MD, 300 mg at 03/08/23 0532   LORazepam (ATIVAN) injection 0.5 mg, 0.5 mg, Intravenous, Q6H PRN, Howerter, Justin B, DO, 0.5 mg at 02/28/23 1204   meclizine (ANTIVERT) tablet 25 mg, 25 mg, Oral, TID PRN, Burnadette Pop, MD, 25 mg at 03/03/23 1247   melatonin tablet 3 mg, 3 mg, Oral, QHS PRN, Howerter, Justin B, DO, 3 mg at 03/04/23 2143   metoCLOPramide (REGLAN) injection 10 mg, 10 mg, Intravenous, Q8H PRN, Renford Dills, Amrit, MD, 10 mg at 03/05/23 0408   ondansetron (ZOFRAN) injection 4 mg, 4 mg, Intravenous, Q6H PRN, Howerter, Justin B, DO, 4 mg at 03/06/23 2227   Oral care mouth rinse, 15 mL, Mouth Rinse, PRN, Howerter, Justin B, DO   polyethylene glycol (MIRALAX / GLYCOLAX) packet 17 g, 17 g, Oral, Daily, Adhikari, Amrit, MD, 17 g at 03/07/23 1046   senna (SENOKOT) tablet 8.6 mg, 1 tablet, Oral, BID, Adhikari, Amrit, MD, 8.6 mg at 03/07/23 1046   sodium bicarbonate tablet 650 mg, 650 mg, Oral, TID, Adhikari, Amrit, MD, 650 mg at 03/08/23 0900  Patients Current Diet:  Diet Order             Diet Carb Modified           Diet Carb Modified Fluid consistency: Thin; Room service appropriate? 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  Independent  Indoor Mobility: Did the patient need assistance with walking from room to room (with or without device)? Independent  Stairs: Did the patient need assistance with internal or external stairs (with or without device)? Independent  Functional Cognition: Did the patient need help planning regular tasks such as shopping or remembering to take medications? Independent  Patient Information Are you of Hispanic, Latino/a,or Spanish origin?: A. No, not of Hispanic, Latino/a, or Spanish origin What is your race?: B. Black or African American Do you need or want an interpreter to communicate with a doctor or health care staff?: 0. No  Patient's Response To:  Health Literacy and Transportation Is the patient able to respond to health literacy and transportation needs?: Yes Health Literacy - How often do you need to have someone help you when you read instructions, pamphlets, or other written material from your doctor or pharmacy?: Never In the past 12 months, has lack of transportation kept you from medical appointments or from getting medications?: No In the past 12 months, has lack of transportation kept you from meetings, work, or from getting things needed for daily living?: No  Journalist, newspaper / Equipment Home Assistive Devices/Equipment: None Home Equipment: None  Prior Device Use: Indicate devices/aids used by the patient prior to current illness, exacerbation or injury? None of the above  Current Functional Level Cognition  Overall Cognitive  Status: Within Functional Limits for tasks assessed General Comments: Pt A&O, motivated to progress balance/endurance so he can return to independence. Good attention to task.    Extremity Assessment (includes Sensation/Coordination)  Upper Extremity Assessment: Overall WFL for tasks assessed (MMT, ROM, coordination and dexterity was [facility])  Lower Extremity Assessment: Defer to PT evaluation    ADLs  Overall ADL's : Needs assistance/impaired Eating/Feeding: Independent, Sitting Grooming: Contact guard assist, Wash/dry face, Standing Grooming Details (indicate cue type and reason): standing at sink Upper Body Bathing: Set up, Sitting Lower Body Bathing: Moderate assistance, Sit to/from stand Upper Body Dressing : Set up, Sitting Lower Body Dressing: Moderate assistance, Sit to/from stand Toilet Transfer: Moderate assistance, Rolling walker (2 wheels) Toileting- Clothing Manipulation and Hygiene: Supervision/safety, Sitting/lateral lean Functional mobility during ADLs: Contact guard assist, Rolling walker (2 wheels) General ADL Comments: Pt declined need for dressing ADLs at this time, focused session on progressing with balance. Pt ambulating in hall carrying backpack (to simulate what he carries for work) on his L and R shoulder with minor LOBs noted throoughout ambulation needing min A to recover. Then proceeded to work on picking up light weight objects from floor level to which he needed min A to help maintain balance    Mobility  Overal bed mobility: Needs Assistance Bed Mobility: Supine to Sit Rolling: Supervision Sidelying to sit: Supervision Supine to sit: Modified independent (Device/Increase time) Sit to supine: Supervision General bed mobility comments: use of bed features/rail    Transfers  Overall transfer level: Needs assistance Equipment used: Rolling walker (2 wheels) Transfers: Sit to/from Stand Sit to Stand: Contact guard assist General transfer comment: from EOB  x10 in a row with arms crossed at chest, pt using BLE supported posterior by bed frame initially, able to adjust to stand without support after cues. Mild c/o "on a boat" sensation with initial stand (found to be orthostatic) but able to perform reciprocal transfers after seated break.    Ambulation / Gait / Stairs / Wheelchair Mobility  Ambulation/Gait Ambulation/Gait assistance: Contact guard assist, Min assist Gait Distance (Feet): 80 Feet Assistive  device: Rolling walker (2 wheels) Gait Pattern/deviations: Step-through pattern General Gait Details: Pt with 3 instances of rightward loss of balance, requiring stepping strategy and UE support to correct. PTA provides cues for segmental turning in an effort to compensate for vestibular dysfunction. Gait velocity: reduced Gait velocity interpretation: <1.8 ft/sec, indicate of risk for recurrent falls    Posture / Balance Balance Overall balance assessment: Needs assistance Sitting-balance support: No upper extremity supported, Feet supported Sitting balance-Leahy Scale: Good Standing balance support: No upper extremity supported, During functional activity Standing balance-Leahy Scale: Fair Standing balance comment: stands and may even ambulate without UE support but has LOBs    Special needs/care consideration Hgb A1c 13.8, has been on insulin pump in the past. Would like assist in getting script to restart his pump     Previous Home Environment  Living Arrangements: Alone  Lives With: Alone Available Help at Discharge: Available 24 hours/day (he states with his family and can get initial 24/7 as needed) Type of Home: Apartment Home Layout: One level Home Access: Level entry Bathroom Shower/Tub: Engineer, manufacturing systems: Standard Bathroom Accessibility: Yes How Accessible: Accessible via walker Home Care Services: No  Discharge Living Setting Plans for Discharge Living Setting: Patient's home, Alone Type of Home at  Discharge: Apartment Discharge Home Layout: One level Discharge Home Access: Level entry Discharge Bathroom Shower/Tub: Tub/shower unit Discharge Bathroom Toilet: Standard Discharge Bathroom Accessibility: Yes How Accessible: Accessible via walker Does the patient have any problems obtaining your medications?: No  Social/Family/Support Systems Patient 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, political party Information: Mom, Engineer, building services Anticipated Caregiver: Mom and Family Anticipated Caregiver's Contact Information: see contacts Caregiver Availability: 24/7 Discharge Plan Discussed with Primary Caregiver: Yes Is Caregiver In Agreement with Plan?: Yes Does Caregiver/Family have Issues with Lodging/Transportation while Pt is in Rehab?: No  Goals Patient/Family Goal for Rehab: Mod I to supervision with PT and OT Expected length of stay: ELOS 5 to 7 days Pt/Family Agrees to Admission and willing to participate: Yes Program Orientation Provided & Reviewed with Pt/Caregiver Including Roles  & Responsibilities: Yes  Decrease burden of Care through IP rehab admission: n/a  Possible need for SNF placement upon discharge:not anticipated  Patient Condition: This patient's medical and functional status has changed since the consult dated: 03/05/23 in which the Rehabilitation Physician determined and documented that the patient's condition is appropriate for intensive rehabilitative care in an inpatient rehabilitation facility. See "History of Present Illness" (above) for medical update. Functional changes are: min to CGA assist. Patient's medical and functional status update has been discussed with the Rehabilitation physician and patient remains appropriate for inpatient rehabilitation. Will admit to inpatient rehab today.  Preadmission Screen Completed By:  Clois Dupes, RN MSN 03/08/2023 9:59 AM ______________________________________________________________________    Discussed status with Dr. Shearon Stalls on 03/08/23 at [phone] and received approval for admission today.  Admission Coordinator:  Clois Dupes RN MSN time [id number] Date 03/08/23

## 2023-03-05 NOTE — Progress Notes (Signed)
  Inpatient Rehabilitation Admissions Coordinator   Met with patient at bedside for rehab assessment. We discussed goals and expectations of a possible CIR admit. . Family can provide expected caregiver support that is recommended of supervision to intermittent assist level. I will begin insurance Auth with [employer] for possible CIR admit pending approval. Please call me with any questions.   Ottie Glazier, RN, MSN Rehab Admissions Coordinator [phone]

## 2023-03-05 NOTE — Consult Note (Signed)
Physical Medicine and Rehabilitation Consult Reason for Consult:CIR acute rehab consult Referring Physician: DR Burnadette Pop   HPI: Bradley Davis is a 37 y.o. L handed male with hx of DM1 with A1c of 13.8; HTN, morbid obesity with BMI 36.1, prior CKD 3a with Cr 1.5-1.9 and HLD admitted 02/27/23 with inability to tolerate PO, lightheadedness and dizziness from [facility]- he was found to have a Cr of 3.31 and admitted to [facility] for AKI- however after a lot of IVFs and pushing PO, didn't really improve- it was thought to be his new baseline.  He was also clinically diagnosed with DM gastroparesis and started on Reglan IV- he's now receiving prn.  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   LDL 131- on max statin A1c 13.8 Also had severe HTN upon admit and Norvasc and Hydralazine maxxed out and restarted. - and ACEI stopped due to Cr however labetolol added- BP has been better.   Pt was started on DAPT; and has ativan for refractive nausea and Antivert for dizziness and Reglan 10 mg IV prn for gastroparesis.   Pt reports when at rest, now not having dizziness/vertigo/nausea, but when moves more than a few cm, he gets sick again.   Has walked 90 ft CGA but having loss of balance regularly.   Pt reports LBM yesterday Trying to set up /go back to insulin pump- used to also use freestyle libre to monitor BG's.   On regular thin diet Owns his own business.   Said peeing with urinal Strength "OK".   Review of Systems  Constitutional:  Positive for malaise/fatigue. Negative for chills and fever.  HENT: Negative.    Eyes:  Negative for blurred vision.  Respiratory:  Negative for cough, sputum production and shortness of breath.   Cardiovascular:  Negative for chest pain,  orthopnea and leg swelling.  Gastrointestinal:  Positive for nausea. Negative for vomiting.  Genitourinary:  Positive for frequency and urgency. Negative for dysuria.  Musculoskeletal:  Negative for back pain, joint pain, myalgias and neck pain.  Skin: Negative.   Neurological:  Positive for dizziness. Negative for sensory change and focal weakness.  Endo/Heme/Allergies:  Positive for polydipsia.  Psychiatric/Behavioral:  Positive for depression. Negative for hallucinations and suicidal ideas. The patient has insomnia.   All other systems reviewed and are negative.  Past Medical History:  Diagnosis Date   CKD stage 3a, GFR 45-59 ml/min (HCC)    Diabetes mellitus    Hypertension    Past Surgical History:  Procedure Laterality Date   INCISION AND DRAINAGE ABSCESS Right 04/21/2021   Procedure: INCISION, DRAINAGE, DEBRIDEMENT OF RIGHT THIGH/GROIN INFECTION;  Surgeon: Abigail Miyamoto, MD;  Location: [facility];  Service: General;  Laterality: Right;   IRRIGATION AND DEBRIDEMENT ABSCESS Right 04/23/2021   Procedure: IRRIGATION AND DEBRIDEMENT RIGHT GROIN WOUND; RIGHT THIGH DRESSING CHANGE;  Surgeon: Abigail Miyamoto, MD;  Location: [facility];  Service: General;  Laterality: Right;   IRRIGATION AND DEBRIDEMENT ABSCESS Right 04/26/2021   Procedure: reexploration and debridement of soft tissue infection right groin;  Surgeon: Emelia Loron, MD;  Location: [facility];  Service: General;  Laterality: Right;   History reviewed. No pertinent family history. Social History:  reports that he has never smoked. He has never used smokeless tobacco. He reports  that he does not currently use alcohol. He reports that he does not use drugs. Allergies:  Allergies  Allergen Reactions   Ibuprofen Swelling    Pt reports taking after reported allergy w/ no reactions   Medications Prior to Admission  Medication Sig Dispense Refill   acetaminophen (TYLENOL) 325 MG tablet Take 650 mg by mouth as needed for mild  pain or headache.     amLODipine (NORVASC) 10 MG tablet Take 1 tablet (10 mg total) by mouth daily. 30 tablet 0   atorvastatin (LIPITOR) 80 MG tablet Take 1 tablet (80 mg total) by mouth daily.     benazepril (LOTENSIN) 20 MG tablet Take 20 mg by mouth daily.     HUMALOG KWIKPEN 100 UNIT/ML KwikPen Inject 1-30 Units into the skin in the morning, at noon, in the evening, and at bedtime. Per sliding scale     hydrochlorothiazide (HYDRODIURIL) 25 MG tablet Take 50 mg by mouth daily.     Insulin Glargine (BASAGLAR KWIKPEN) 100 UNIT/ML Inject 40 Units into the skin at bedtime.     sildenafil (VIAGRA) 50 MG tablet Take 50 mg by mouth as needed for erectile dysfunction.     sodium chloride (OCEAN) 0.65 % nasal spray Place 2 sprays into the nose daily as needed for congestion.     Vitamin D, Ergocalciferol, (DRISDOL) 1.25 MG (50000 UNIT) CAPS capsule Take 50,000 Units by mouth once a week. 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 Functional Status:  Mobility: Bed Mobility Overal bed mobility: Needs Assistance Bed Mobility: Rolling, Sidelying to Sit, Sit to Supine Rolling: Supervision Sidelying to sit: Supervision Sit to supine: Supervision Transfers Overall transfer level: Needs assistance Equipment used: Rolling walker (2 wheels) Transfers: Sit to/from Stand Sit to Stand: Supervision General transfer comment: CGA for STS, up to mod A needed for ambulation  due to LOBs Ambulation/Gait Ambulation/Gait assistance: Contact guard assist Gait Distance (Feet): 80 Feet (additional trial of 60') Assistive device: Rolling walker (2 wheels) Gait Pattern/deviations: Step-through pattern General Gait Details: pt with 2 instances of rightward loss of balance, requiring stepping strategy and UE support to correct. PT provides cues for segmental turning in an effort to compensate for vestibular dysfunction. Gait velocity: reduced Gait velocity interpretation: <1.8 ft/sec, indicate of risk for recurrent falls    ADL: ADL Overall ADL's : Needs assistance/impaired Eating/Feeding: Independent, Sitting Grooming: Set up, Sitting Grooming Details (indicate cue type and reason): sitting for safety, mod A for LOB in standing Upper Body Bathing: Set up, Sitting Lower Body Bathing: Moderate assistance, Sit to/from stand Upper Body Dressing : Set up, Sitting Lower Body Dressing: Moderate assistance, Sit to/from stand Toilet Transfer: Moderate assistance, Rolling walker (2 wheels) Toileting- Clothing Manipulation and Hygiene: Supervision/safety, Sitting/lateral lean Functional mobility during ADLs: Moderate assistance, Rolling walker (2 wheels) General ADL Comments: ADLs completed in sitting for safety, up to mod A needed in standing due to 2x significant LOBs  Cognition: Cognition Overall Cognitive Status: Within Functional Limits for tasks assessed Cognition Arousal: Alert Behavior During Therapy: [facility] for tasks assessed/performed  Overall Cognitive Status: Within Functional Limits for tasks assessed General Comments: Overall WFL, anxious about new balance deficits  Blood pressure (!) 149/82, pulse 90, temperature 98.7 F (37.1 C), resp. rate 20, height 5\' 10"  (1.778 m), weight 114.2 kg, SpO2 96%. Physical Exam Vitals and nursing note reviewed.  Constitutional:      General: He is not in acute distress.    Appearance: He is obese. He is ill-appearing.      Comments: On side in bed; curled up; sleeping initially; woke but still slightly delayed in responses; sleepy; NAD  HENT:     Head: Normocephalic and atraumatic.     Comments: No facial droop Tongue midline    Right Ear: External ear normal.     Left Ear: External ear normal.     Nose: Nose normal. No congestion.     Mouth/Throat:     Mouth: Mucous membranes are dry.     Pharynx: Oropharynx is clear. No oropharyngeal exudate.  Eyes:     General:        Right eye: No discharge.        Left eye: No discharge.     Extraocular Movements: Extraocular movements intact.     Comments: Mild nystagmus B/L  Cardiovascular:     Rate and Rhythm: Normal rate and regular rhythm.     Heart sounds: Normal heart sounds. No murmur heard.    No gallop.  Pulmonary:     Effort: Pulmonary effort is normal. No respiratory distress.     Breath sounds: Normal breath sounds. No wheezing, rhonchi or rales.  Abdominal:     General: Bowel sounds are normal. There is no distension.     Palpations: Abdomen is soft.     Tenderness: There is no abdominal tenderness.     Comments: Protuberant; soft  Musculoskeletal:     Cervical back: Neck supple. No tenderness.     Comments: 5/5 in Ue's and LE's B/L    Skin:    General: Skin is warm and dry.     Comments: Has IV- looks OK  Neurological:     Mental Status: He is alert.     Sensory: No sensory deficit.     Comments: L>R searching pattern finger to nose  Intact to light touch in all 4 extremities- except decreased in ankles/feet from neuropathy B/L Ox3, but very slightly delayed- could be just woke up    Psychiatric:     Comments: Flat affect     Results for orders placed or performed during the hospital encounter of 02/27/23 (from the past 24 hour(s))  Glucose, capillary     Status: Abnormal   Collection Time: 03/04/23  3:53 PM  Result Value Ref Range   Glucose-Capillary 241 (H) 70 - 99 mg/dL  Glucose, capillary     Status: Abnormal   Collection  Time: 03/04/23  9:26 PM  Result Value Ref Range   Glucose-Capillary 224 (H) 70 - 99 mg/dL  Glucose, capillary     Status: Abnormal   Collection Time: 03/05/23  7:32 AM  Result Value Ref Range   Glucose-Capillary 201 (H) 70 - 99 mg/dL  Basic metabolic panel     Status: Abnormal   Collection Time: 03/05/23 10:05 AM  Result Value Ref Range   Sodium 138 135 - 145 mmol/L   Potassium 4.1 3.5 - 5.1 mmol/L   Chloride 108 98 - 111 mmol/L   CO2 20 (L) 22 - 32 mmol/L   Glucose, Bld 225 (  H) 70 - 99 mg/dL   BUN 32 (H) 6 - 20 mg/dL   Creatinine, Ser 0.34 (H) 0.61 - 1.24 mg/dL   Calcium 8.8 (L) 8.9 - 10.3 mg/dL   GFR, Estimated 26 (L) >60 mL/min   Anion gap 10 5 - 15  Lipid panel     Status: Abnormal   Collection Time: 03/05/23 10:05 AM  Result Value Ref Range   Cholesterol 193 0 - 200 mg/dL   Triglycerides 742 <595 mg/dL   HDL 41 >63 mg/dL   Total CHOL/HDL Ratio 4.7 RATIO   VLDL 22 0 - 40 mg/dL   LDL Cholesterol 875 (H) 0 - 99 mg/dL  Glucose, capillary     Status: Abnormal   Collection Time: 03/05/23 11:17 AM  Result Value Ref Range   Glucose-Capillary 190 (H) 70 - 99 mg/dL   MR ANGIO HEAD WO CONTRAST  Result Date: 03/04/2023 CLINICAL DATA:  Stroke follow-up EXAM: MRA NECK WITHOUT CONTRAST MRA HEAD WITHOUT CONTRAST TECHNIQUE: Angiographic images of the Circle of Willis were acquired using MRA technique without intravenous contrast. COMPARISON:  None Available. FINDINGS: MRA NECK FINDINGS Aortic arch: Normal Right carotid system: Normal Left carotid system: Normal Vertebral arteries: Right dominant.  Patent with antegrade flow Other: None. MRA HEAD FINDINGS POSTERIOR CIRCULATION: --Vertebral arteries: Normal --Inferior cerebellar arteries: Normal. --Basilar artery: Normal. --Superior cerebellar arteries: Normal. --Posterior cerebral arteries: Normal. ANTERIOR CIRCULATION: --Intracranial internal carotid arteries: Normal. --Anterior cerebral arteries (ACA): Normal. --Middle cerebral arteries  (MCA): Normal. Other: None. IMPRESSION: Normal MRA of the head and neck. Electronically Signed   By: Deatra Robinson M.D.   On: 03/04/2023 23:01   MR ANGIO NECK WO CONTRAST  Result Date: 03/04/2023 CLINICAL DATA:  Stroke follow-up EXAM: MRA NECK WITHOUT CONTRAST MRA HEAD WITHOUT CONTRAST TECHNIQUE: Angiographic images of the Circle of Willis were acquired using MRA technique without intravenous contrast. COMPARISON:  None Available. FINDINGS: MRA NECK FINDINGS Aortic arch: Normal Right carotid system: Normal Left carotid system: Normal Vertebral arteries: Right dominant.  Patent with antegrade flow Other: None. MRA HEAD FINDINGS POSTERIOR CIRCULATION: --Vertebral arteries: Normal --Inferior cerebellar arteries: Normal. --Basilar artery: Normal. --Superior cerebellar arteries: Normal. --Posterior cerebral arteries: Normal. ANTERIOR CIRCULATION: --Intracranial internal carotid arteries: Normal. --Anterior cerebral arteries (ACA): Normal. --Middle cerebral arteries (MCA): Normal. Other: None. IMPRESSION: Normal MRA of the head and neck. Electronically Signed   By: Deatra Robinson M.D.   On: 03/04/2023 23:01   MR BRAIN WO CONTRAST  Result Date: 03/04/2023 CLINICAL DATA:  Syncope/presyncope with cerebrovascular cause suspected EXAM: MRI HEAD WITHOUT CONTRAST TECHNIQUE: Multiplanar, multiecho pulse sequences of the brain and surrounding structures were obtained without intravenous contrast. COMPARISON:  Head CT from yesterday FINDINGS: Brain: 8 mm area of restricted diffusion in the lateral right medulla. Tiny area of more week diffusion hyperintensity along the body of the right lateral ventricle. Although demyelinating process is considered given the patient's young age in the distribution, this is more consistent with small vessel ischemia given patient's history of diabetes, hypertension, and renal failure. No acute hemorrhage, hydrocephalus, masslike finding, or collection. Mild chronic white matter disease in  the bilateral cerebral hemisphere. Vascular: Major flow voids are preserved, including the right V4 segment. Skull and upper cervical spine: Normal marrow signal Sinuses/Orbits: Unremarkable Other: Posterior scalp scarring. IMPRESSION: 1. Small acute infarct in the lateral right medulla. 2. Small subacute infarct in the deep right cerebral white matter adjacent to the lateral ventricle. 3. Mild chronic white matter disease. Electronically Signed   By:  Tiburcio Pea M.D.   On: 03/04/2023 11:03     Assessment/Plan: Diagnosis:  R medullar infarct with dizziness/vertigo/nausea Does the need for close, 24 hr/day medical supervision in concert with the patient's rehab needs make it unreasonable for this patient to be served in a less intensive setting? 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 Yes Does the patient require coordinated care of a physician, rehab nurse, therapy disciplines of PT and OT to address physical and functional deficits in the context of the above medical diagnosis(es)? Yes Addressing deficits in the following areas: balance, endurance, locomotion, strength, transferring, bowel/bladder control, bathing, dressing, feeding, grooming, and toileting Can the patient actively participate in an intensive therapy program of at least 3 hrs of therapy per day at least 5 days per week? Yes The potential for patient to make measurable gains while on inpatient rehab is good Anticipated functional outcomes upon discharge from inpatient rehab are modified independent and supervision  with PT, modified independent and supervision with OT, n/a with  SLP. Estimated rehab length of stay to reach the above functional goals is: 12-14 days Anticipated discharge destination: Home Overall Rehab/Functional Prognosis: good  RECOMMENDATIONS: This patient's condition is appropriate for continued rehabilitative care in the following setting: CIR Patient has agreed to participate in recommended program. Yes Note that insurance prior authorization may be required for reimbursement for recommended care.  Comment:  1. Would benefit if possible from scheduling PO Reglan for gastroparesis.   2. Zofran in 20% of cases can cause constipation, so might benefit from phenergan more than Zofran, in spite of sedation that can come with it 3. Pt wants ot get back to his prior insulin pump and Freestyle libre- might get better control of his DM1 if possible? 4. Will submit for CIR via admissions coordinators.  5. I agree with Leqvio and stroke apnea study- due to body habitus and falling asleep during day I agree with plan.  6. Thank you for this consult.     Genice Rouge, MD 03/05/2023    I spent a total of  64   minutes on total care today- >50% coordination of care- due to  Review of chart- extensive- vitals, labs and imaging; also examination and interview of pt; and typing up consult as detailed above.

## 2023-03-05 NOTE — Plan of Care (Signed)
  Problem: Health Behavior/Discharge Planning: Goal: Ability to manage health-related needs will improve Outcome: Progressing   

## 2023-03-05 NOTE — Evaluation (Signed)
Clinical/Bedside Swallow Evaluation Patient Details  Name: Bradley Davis MRN: [id number] Date of Birth: 09/24/1985  Today's Date: 03/05/2023 Time: SLP Start Time (ACUTE ONLY): 0915 SLP Stop Time (ACUTE ONLY): 0925 SLP Time Calculation (min) (ACUTE ONLY): 10 min  Past Medical History:  Past Medical History:  Diagnosis Date   CKD stage 3a, GFR 45-59 ml/min (HCC)    Diabetes mellitus    Hypertension    Past Surgical History:  Past Surgical History:  Procedure Laterality Date   INCISION AND DRAINAGE ABSCESS Right 04/21/2021   Procedure: INCISION, DRAINAGE, DEBRIDEMENT OF RIGHT THIGH/GROIN INFECTION;  Surgeon: Abigail Miyamoto, MD;  Location: [facility];  Service: General;  Laterality: Right;   IRRIGATION AND DEBRIDEMENT ABSCESS Right 04/23/2021   Procedure: IRRIGATION AND DEBRIDEMENT RIGHT GROIN WOUND; RIGHT THIGH DRESSING CHANGE;  Surgeon: Abigail Miyamoto, MD;  Location: [facility];  Service: General;  Laterality: Right;   IRRIGATION AND DEBRIDEMENT ABSCESS Right 04/26/2021   Procedure: reexploration and debridement of soft tissue infection right groin;  Surgeon: Emelia Loron, MD;  Location: [facility];  Service: General;  Laterality: Right;   HPI:  Pt is a 37 year old male who presented on 08/21 with nausea and vomitting, diziness, and headache. MRI revealed an acute medullary infacrt and subacute white matter infarct. Significant PMH includes HTN, CKD 3a, and DM 1.    Assessment / Plan / Recommendation  Clinical Impression  Pt was seen by SLP for a bedside swallow evaluation indicated by the presence of an acute medullary infarct and pt intolarance to POs due to naseua. Pt complained of naseua approximately 20 minutes following any oral intake and dizziness since 08/21. No clinical s/s of dysphagia were described by the patient. No PMH of dysphagia. SLP tested thin liquid via straw and puree via pt self-feed. The pt exhibited no clinical s/s of swallowing impairment. SLP Visit Diagnosis:  Dysphagia, unspecified (R13.10)    Aspiration Risk  No limitations    Diet Recommendation Thin liquid    Liquid Administration via: Spoon;Cup;Straw Medication Administration: Whole meds with liquid Supervision: Patient able to self feed Compensations: Slow rate    Other  Recommendations Oral Care Recommendations: Patient independent with oral care    Recommendations for follow up therapy are one component of a multi-disciplinary discharge planning process, led by the attending physician.  Recommendations may be updated based on patient status, additional functional criteria and insurance authorization.  Follow up Recommendations No SLP follow up      Assistance Recommended at Discharge    Functional Status Assessment Patient has not had a recent decline in their functional status  Frequency and Duration            Prognosis Prognosis for improved oropharyngeal function: Good      Swallow Study   General Date of Onset: 02/27/23 HPI: Pt is a 37 year old male who presented on 08/21 with nausea and vomitting, diziness, and headache. MRI revealed an acute medullary infacrt and subacute white matter infarct. Significant PMH includes HTN, CKD 3a, and DM 1. Type of Study: Bedside Swallow Evaluation Previous Swallow Assessment: n/a Diet Prior to this Study: Regular Temperature Spikes Noted: No Respiratory Status: Room air History of Recent Intubation: No Behavior/Cognition: Alert;Cooperative;Pleasant mood Oral Cavity Assessment: Within Functional Limits Oral Care Completed by SLP: No Oral Cavity - Dentition: Adequate natural dentition Vision: Functional for self-feeding Self-Feeding Abilities: Able to feed self Patient Positioning: Upright in bed Baseline Vocal Quality: Normal Volitional Swallow: Able to elicit  Oral/Motor/Sensory Function     Ice [employer] chips: Not tested   Thin Liquid Thin Liquid: Within functional limits Presentation: Cup;Self Fed    Nectar Thick  Nectar Thick Liquid: Not tested   Honey Thick Honey Thick Liquid: Not tested   Puree Puree: Within functional limits   Solid     Solid: Not tested      Marline Backbone, B.S., Speech Therapy Student

## 2023-03-05 NOTE — Progress Notes (Signed)
Physical Therapy Treatment Patient Details Name: Bradley Davis MRN: [id number] DOB: [birth year] Today's Date: 03/05/2023   History of Present Illness 37 y.o. male presents to [facility] hospital on 02/27/2023 with nausea and vomiting for 2 days. Pt was unable to tolerate oral intake and was lightheaded. Pt was hypertensive on evaluation with elevated creatinine. Pt was admitted for AKI and CKD stage III. 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  PMH: DM 1, HTN, CKD III.    PT Comments  Pt tolerates treatment well but continues to demonstrate instability and dizziness when mobilizing. PT performs vestibular assessment which is remarkable for dizziness with horizontal VOR along with bed mobility and rolling. Pt reports consistent symptoms with rolling to R side. Dix-hallpike to R does reproduce symptoms however epley does not appear to provide significant symptom relief at this time. Pt continues to demonstrate instability when mobilizing and benefits from BUE support of walker to improve balance. PT provides education on segmental turning to improve balance and reduce falls risk. 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   If plan is discharge home, recommend the following: A little help with walking and/or transfers;A lot of help with bathing/dressing/bathroom;Assistance with cooking/housework;Assist for transportation;Help with stairs or ramp for entrance   Can travel by private vehicle        Equipment Recommendations  Rolling walker (2 wheels);BSC/3in1    Recommendations for Other Services       Precautions / Restrictions Precautions Precautions: Fall Precaution Comments: R medullary CVA with vestibular symptoms Restrictions Weight Bearing Restrictions: No     Mobility  Bed Mobility Overal bed mobility: Needs Assistance Bed Mobility: Rolling, Sidelying to Sit, Sit to Supine Rolling:  Supervision Sidelying to sit: Supervision   Sit to supine: Supervision        Transfers Overall transfer level: Needs assistance Equipment used: Rolling walker (2 wheels) Transfers: Sit to/from Stand Sit to Stand: Supervision                Ambulation/Gait Ambulation/Gait assistance: Contact guard assist Gait Distance (Feet): 80 Feet (additional trial of 60') Assistive device: Rolling walker (2 wheels) Gait Pattern/deviations: Step-through pattern Gait velocity: reduced Gait velocity interpretation: <1.8 ft/sec, indicate of risk for recurrent falls   General Gait Details: pt with 2 instances of rightward loss of balance, requiring stepping strategy and UE support to correct. PT provides cues for segmental turning in an effort to compensate for vestibular dysfunction.   Stairs             Wheelchair Mobility     Tilt Bed    Modified Rankin (Stroke Patients Only) Modified Rankin (Stroke Patients Only) Pre-Morbid Rankin Score: No symptoms Modified Rankin: Moderately severe disability     Balance Overall balance assessment: Needs assistance Sitting-balance support: No upper extremity supported, Feet supported Sitting balance-Leahy Scale: Good     Standing balance support: No upper extremity supported, During functional activity Standing balance-Leahy Scale: Fair                              Cognition Arousal: Alert Behavior During Therapy: WFL for tasks assessed/performed Overall Cognitive Status: Within Functional Limits for tasks assessed  Exercises Other Exercises Other Exercises: VOR x 1 horizontal    General Comments General comments (skin integrity, edema, etc.): VSS on RA. Pt reports dizziness overnight with rolling to R side. PT performs vestibular assessment, pursuits and saccades WFL, VOR horizontal with report of mild dizziness although no nystagmus noted. dix hallpike  to R side positive for dizziness, epley maneuver does not seem to provide significant relief in symptoms at this time.      Pertinent Vitals/Pain Pain Assessment Pain Assessment: Faces Faces Pain Scale: Hurts little more Pain Location: neck Pain Descriptors / Indicators: Aching Pain Intervention(s): Monitored during session    Home Living                          Prior Function            PT Goals (current goals can now be found in the care plan section) Acute Rehab PT Goals Patient Stated Goal: To return home Progress towards PT goals: Progressing toward goals    Frequency    Min 1X/week      PT Plan      Co-evaluation              AM-PAC PT "6 Clicks" Mobility   Outcome Measure  Help needed turning from your back to your side while in a flat bed without using bedrails?: A Little Help needed moving from lying on your back to sitting on the side of a flat bed without using bedrails?: A Little Help needed moving to and from a bed to a chair (including a wheelchair)?: A Little Help needed standing up from a chair using your arms (e.g., wheelchair or bedside chair)?: A Little Help needed to walk in hospital room?: A Little Help needed climbing 3-5 steps with a railing? : Total 6 Click Score: 16    End of Session Equipment Utilized During Treatment: Gait belt Activity Tolerance: Patient tolerated treatment well Patient left: in chair;with call bell/phone within reach Nurse Communication: Mobility status PT Visit Diagnosis: Unsteadiness on feet (R26.81);Other abnormalities of gait and mobility (R26.89)     Time: 1610-9604 PT Time Calculation (min) (ACUTE ONLY): 38 min  Charges:    $Gait Training: 8-22 mins $Therapeutic Exercise: 8-22 mins $Therapeutic Activity: 8-22 mins PT General Charges $$ ACUTE PT VISIT: 1 Visit                     Arlyss Gandy, PT, DPT Acute Rehabilitation Office [phone]    Arlyss Gandy 03/05/2023, 10:57  AM

## 2023-03-06 DIAGNOSIS — N1831 Chronic kidney disease, stage 3a: Secondary | ICD-10-CM | POA: Diagnosis not present

## 2023-03-06 DIAGNOSIS — G4733 Obstructive sleep apnea (adult) (pediatric): Secondary | ICD-10-CM | POA: Diagnosis not present

## 2023-03-06 DIAGNOSIS — N179 Acute kidney failure, unspecified: Secondary | ICD-10-CM | POA: Diagnosis not present

## 2023-03-06 LAB — BASIC METABOLIC PANEL
Anion gap: 14 (ref 5–15)
BUN: 30 mg/dL — ABNORMAL HIGH (ref 6–20)
CO2: 19 mmol/L — ABNORMAL LOW (ref 22–32)
Calcium: 8.7 mg/dL — ABNORMAL LOW (ref 8.9–10.3)
Chloride: 105 mmol/L (ref 98–111)
Creatinine, Ser: 3.07 mg/dL — ABNORMAL HIGH (ref 0.61–1.24)
GFR, Estimated: 26 mL/min — ABNORMAL LOW (ref >60)
Glucose, Bld: 195 mg/dL — ABNORMAL HIGH (ref 70–99)
Potassium: 3.8 mmol/L (ref 3.5–5.1)
Sodium: 138 mmol/L (ref 135–145)

## 2023-03-06 LAB — GLUCOSE, CAPILLARY
Glucose-Capillary: 180 mg/dL — ABNORMAL HIGH (ref 70–99)
Glucose-Capillary: 197 mg/dL — ABNORMAL HIGH (ref 70–99)
Glucose-Capillary: 179 mg/dL — ABNORMAL HIGH (ref 70–99)
Glucose-Capillary: 244 mg/dL — ABNORMAL HIGH (ref 70–99)

## 2023-03-06 MED ORDER — INSULIN DETEMIR 100 UNIT/ML ~~LOC~~ SOLN
18.0000 [IU] | Freq: Two times a day (BID) | SUBCUTANEOUS | Status: DC
  Administered 2023-03-06 – 2023-03-07 (×2): 18 [IU] via SUBCUTANEOUS
  Filled 2023-03-06 (×3): qty 0.18

## 2023-03-06 MED ORDER — SODIUM BICARBONATE 650 MG PO TABS
650.0000 mg | ORAL_TABLET | Freq: Three times a day (TID) | ORAL | Status: DC
  Administered 2023-03-06 – 2023-03-08 (×6): 650 mg via ORAL
  Filled 2023-03-06 (×6): qty 1

## 2023-03-06 NOTE — Progress Notes (Addendum)
STROKE TEAM PROGRESS NOTE   BRIEF HPI Bradley Davis is a 37 y.o. male with PMH significant for HTN, CKD 3a, DM1 who presented to ED 8/21 c/o nausea and vomiting x 1 day, intermittent positional dizziness and R sided headache.     SIGNIFICANT HOSPITAL EVENTS 8/21 presented to ED to hospital  INTERIM HISTORY/SUBJECTIVE Informed patient that he qualified for the sleep smart study, and he will be mask fitted tonight. Still waiting on CIR pending insurance approval. Pt does not report and changes or concerns today.  He states his neurological exam is improved with less numbness on his cheek today   OBJECTIVE  CBC    Component Value Date/Time   WBC 8.1 02/28/2023 0412   RBC 4.69 02/28/2023 0412   HGB 12.5 (L) 02/28/2023 0412   HCT 37.1 (L) 02/28/2023 0412   PLT 346 02/28/2023 0412   MCV 79.1 (L) 02/28/2023 0412   MCV 87.1 01/19/2012 1310   MCH 26.7 02/28/2023 0412   MCHC 33.7 02/28/2023 0412   RDW 13.2 02/28/2023 0412   LYMPHSABS 3.2 02/28/2023 0412   MONOABS 0.6 02/28/2023 0412   EOSABS 0.3 02/28/2023 0412   BASOSABS 0.0 02/28/2023 0412    BMET    Component Value Date/Time   NA 138 03/06/2023 0915   K 3.8 03/06/2023 0915   CL 105 03/06/2023 0915   CO2 19 (L) 03/06/2023 0915   GLUCOSE 195 (H) 03/06/2023 0915   BUN 30 (H) 03/06/2023 0915   CREATININE 3.07 (H) 03/06/2023 0915   CALCIUM 8.7 (L) 03/06/2023 0915   GFRNONAA 26 (L) 03/06/2023 0915    IMAGING past 24 hours ECHOCARDIOGRAM COMPLETE BUBBLE STUDY  Result Date: 03/05/2023    ECHOCARDIOGRAM REPORT   Patient Name:   Bradley Davis Date of Exam: 03/05/2023 Medical Rec #:  [id number]      Height:       70.0 in Accession #:    [id number]     Weight:       251.7 lb Date of Birth:  [birth year]      BSA:          2.302 m Patient Age:    37 years       BP:           49/82 mmHg Patient Gender: M              HR:           89 bpm. Exam Location:  Inpatient Procedure: 2D Echo, Color Doppler, Cardiac Doppler and Saline Contrast  Bubble            Study Indications:    Stroke i63.9  History:        Patient has no prior history of Echocardiogram examinations.                 Risk Factors:Hypertension and Diabetes.  Sonographer:    Irving Burton Senior RDCS Referring Phys: [id number] AMRIT ADHIKARI IMPRESSIONS  1. Left ventricular ejection fraction, by estimation, is 55 to 60%. The left ventricle has normal function. The left ventricle has no regional wall motion abnormalities. Left ventricular diastolic parameters were normal.  2. Right ventricular systolic function is normal. The right ventricular size is normal. Tricuspid regurgitation signal is inadequate for assessing PA pressure.  3. The mitral valve is normal in structure. No evidence of mitral valve regurgitation.  4. The aortic valve is tricuspid. Aortic valve regurgitation is not visualized.  5. The inferior vena cava is normal  in size with <50% respiratory variability, suggesting right atrial pressure of 8 mmHg.  6. Agitated saline contrast bubble study was negative, with no evidence of any interatrial shunt. Comparison(s): No prior Echocardiogram. FINDINGS  Left Ventricle: Left ventricular ejection fraction, by estimation, is 55 to 60%. The left ventricle has normal function. The left ventricle has no regional wall motion abnormalities. The left ventricular internal cavity size was normal in size. There is  no left ventricular hypertrophy. Left ventricular diastolic parameters were normal. Right Ventricle: The right ventricular size is normal. No increase in right ventricular wall thickness. Right ventricular systolic function is normal. Tricuspid regurgitation signal is inadequate for assessing PA pressure. Left Atrium: Left atrial size was normal in size. Right Atrium: Right atrial size was normal in size. Pericardium: Trivial pericardial effusion is present. The pericardial effusion is posterior to the left ventricle. Mitral Valve: The mitral valve is normal in structure. No evidence of  mitral valve regurgitation. Tricuspid Valve: The tricuspid valve is normal in structure. Tricuspid valve regurgitation is trivial. Aortic Valve: The aortic valve is tricuspid. Aortic valve regurgitation is not visualized. Pulmonic Valve: The pulmonic valve was normal in structure. Pulmonic valve regurgitation is trivial. Aorta: The aortic root, ascending aorta and aortic arch are all structurally normal, with no evidence of dilitation or obstruction. Venous: The inferior vena cava is normal in size with less than 50% respiratory variability, suggesting right atrial pressure of 8 mmHg. IAS/Shunts: The interatrial septum was not well visualized. Agitated saline contrast was given intravenously to evaluate for intracardiac shunting. Agitated saline contrast bubble study was negative, with no evidence of any interatrial shunt.  LEFT VENTRICLE PLAX 2D LVIDd:         5.10 cm   Diastology LVIDs:         3.80 cm   LV e' medial:    7.07 cm/s LV PW:         1.00 cm   LV E/e' medial:  9.9 LV IVS:        0.90 cm   LV e' lateral:   14.50 cm/s LVOT diam:     2.30 cm   LV E/e' lateral: 4.8 LV SV:         68 LV SV Index:   30 LVOT Area:     4.15 cm  RIGHT VENTRICLE RV S prime:     16.40 cm/s TAPSE (M-mode): 2.0 cm LEFT ATRIUM             Index        RIGHT ATRIUM           Index LA diam:        4.30 cm 1.87 cm/m   RA Area:     11.30 cm LA Vol (A2C):   44.2 ml 19.20 ml/m  RA Volume:   22.50 ml  9.78 ml/m LA Vol (A4C):   51.7 ml 22.46 ml/m LA Biplane Vol: 52.1 ml 22.64 ml/m  AORTIC VALVE LVOT Vmax:   87.35 cm/s LVOT Vmean:  64.100 cm/s LVOT VTI:    0.164 m  AORTA Ao Root diam: 3.10 cm Ao Asc diam:  3.00 cm MITRAL VALVE MV Area (PHT): 3.06 cm    SHUNTS MV Decel Time: 248 msec    Systemic VTI:  0.16 m MV E velocity: 69.80 cm/s  Systemic Diam: 2.30 cm MV A velocity: 63.40 cm/s MV E/A ratio:  1.10 Riley Lam MD Electronically signed by Riley Lam MD Signature Date/Time: 03/05/2023/3:29:45 PM  Final      Vitals:   03/05/23 1656 03/05/23 2223 03/06/23 0505 03/06/23 0839  BP: (!) 180/98 (!) 169/99 133/72 (!) 163/89  Pulse: 91 90 91 88  Resp: 18 18 18 18   Temp: 98.2 F (36.8 C) 98.7 F (37.1 C) 98.5 F (36.9 C) 98.3 F (36.8 C)  TempSrc:  Oral    SpO2: 98% 100% 94% 93%  Weight:      Height:         PHYSICAL EXAM General:  Alert, well-nourished, well-developed patient in no acute distress Psych:  Mood and affect appropriate for situation CV: Regular rate and rhythm on monitor Respiratory:  Regular, unlabored respirations on room air GI: Abdomen soft and nontender   NEURO:  Mental Status: AA&Ox3, patient is able to give clear and coherent history Speech/Language: speech is without dysarthria or aphasia.  Naming, repetition, fluency, and comprehension intact.  Cranial Nerves:  II: PERRL. Visual fields full.  III, IV, VI: EOMI. Eyelids elevate symmetrically.  V: R facial numbness compared to L.   VII: L lower facial weakness compared to R.  VIII: hearing intact to voice. IX, X: Palate elevates symmetrically. Phonation is normal.  XB:JYNWGNFA shrug 5/5. XII: tongue is midline without fasciculations. Motor: 5/5 strength to all muscle groups tested.  Tone: is normal and bulk is normal Sensation- Intact to light touch bilaterally, except R facial numbness. Extinction absent to light touch to DSS.   Coordination: FTN intact bilaterally, HKS: no ataxia in BLE.No drift.  Gait- deferred   .   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    MRI  small acute infract in the lateral R medulla. Small subacute infarct in the deep R cerebral white matter.  MRA (head/neck)  normal 2D Echo EF 55-60%. Negative bubble. No intracardiac source of emboli detected.  Normal L atrium.  LDL 130 HgbA1c 13.8 VTE prophylaxis - SCDs No antithrombotic prior to admission, now on aspirin 81 mg daily and clopidogrel 75 mg daily for 3 weeks and then aspirin alone. Therapy recommendations:  CIR, pending insurance Disposition:  pending  Hypertension Home meds:  amlodipine 10 mg once daily, hydrochlorothiazide 25 mg once daily Stable but hypertensive BP goal: normotensive  Hyperlipidemia Home meds:  atorvastatin 80 mg once daily, resumed in hospital LDL 130, goal < 70 On maximum statin and not at goal, sent prior authorization for Leqvio infusion  Continue statin at discharge  Diabetes type II Uncontrolled Home meds:  insulin glargine 40 units once at bedtime, insulin lispro 1-30 units per sliding scale three times daily with meals HgbA1c 13.8, goal < 7.0 Followed by endocrinology and PCP.  Unable to afford CGM for the last year.  Diabetes RN consulted.  Recommend close follow-up with PCP for better DM control  Dysphagia Patient has post-stroke dysphagia, SLP consulted    Diet   Diet Carb Modified Fluid consistency: Thin; Room service appropriate? Yes   Advance diet as tolerated  Other Stroke Risk Factors ETOH use, advised to drink no more than 2 drinks a day Obesity, Body mass index is 36.12 kg/m., BMI >/= 30 associated with increased stroke risk, recommend weight loss, diet and exercise as appropriate  High risk for Obstructive sleep apnea but no formal past assessment, discussed Stroke x OSA clinical trial with patient.   Other  Active Problems AKI on CKD, Cr ~3 (baseline ~2), primary team following Nausea/vomiting, on reglan  Hospital day # 7  Meryl Dare, MD PGY-1 Psychiatry Resident 03/06/2023, 11:08 AM  I have personally obtained history,examined this patient, reviewed notes, independently viewed imaging studies, participated in medical decision making and plan of care.ROS completed by me personally and pertinent positives fully  documented  I have made any additions or clarifications directly to the above note. Agree with note above.  Patient neurological exam shows improvement today.  He did sign consent to participate in the sleep smart study and last night tested positive on the NOx 3 monitor for obstructive sleep apnea.  He will have CPAP mask tolerability test tonight.  Continue ongoing therapies.  Await insurance approval for transfer to inpatient rehab when bed available.  Delia Heady, MD Medical Director [facility] Pager: [phone] 03/06/2023 3:44 PM  To contact Stroke Continuity provider, please refer to [email or web address]. After hours, contact General Neurology

## 2023-03-06 NOTE — Plan of Care (Signed)
  Problem: Health Behavior/Discharge Planning: Goal: Ability to manage health-related needs will improve Outcome: Progressing   Problem: Clinical Measurements: Goal: Ability to maintain clinical measurements within normal limits will improve Outcome: Progressing   Problem: Fluid Volume: Goal: Ability to maintain a balanced intake and output will improve Outcome: Progressing   Problem: Health Behavior/Discharge Planning: Goal: Ability to identify and utilize available resources and services will improve Outcome: Progressing Goal: Ability to manage health-related needs will improve Outcome: Progressing   Problem: Metabolic: Goal: Ability to maintain appropriate glucose levels will improve Outcome: Progressing   Problem: Nutritional: Goal: Maintenance of adequate nutrition will improve Outcome: Progressing Goal: Progress toward achieving an optimal weight will improve Outcome: Progressing   Problem: Skin Integrity: Goal: Risk for impaired skin integrity will decrease Outcome: Progressing   Problem: Tissue Perfusion: Goal: Adequacy of tissue perfusion will improve Outcome: Progressing   Problem: Education: Goal: Knowledge of disease or condition will improve Outcome: Progressing Goal: Knowledge of secondary prevention will improve (MUST DOCUMENT ALL) Outcome: Progressing Goal: Knowledge of patient specific risk factors will improve Loraine Leriche N/A or DELETE if not current risk factor) Outcome: Progressing   Problem: Ischemic Stroke/TIA Tissue Perfusion: Goal: Complications of ischemic stroke/TIA will be minimized Outcome: Progressing   Problem: Coping: Goal: Will verbalize positive feelings about self Outcome: Progressing Goal: Will identify appropriate support needs Outcome: Progressing   Problem: Health Behavior/Discharge Planning: Goal: Ability to manage health-related needs will improve Outcome: Progressing Goal: Goals will be collaboratively established with  patient/family Outcome: Progressing   Problem: Self-Care: Goal: Ability to participate in self-care as condition permits will improve Outcome: Progressing Goal: Verbalization of feelings and concerns over difficulty with self-care will improve Outcome: Progressing Goal: Ability to communicate needs accurately will improve Outcome: Progressing   Problem: Nutrition: Goal: Risk of aspiration will decrease Outcome: Progressing Goal: Dietary intake will improve Outcome: Progressing

## 2023-03-06 NOTE — Progress Notes (Signed)
Inpatient Rehabilitation Admissions Coordinator   I await insurance approval for possible CIR admit.  Ottie Glazier, RN, MSN Rehab Admissions Coordinator [phone] 03/06/2023 12:20 PM

## 2023-03-06 NOTE — Progress Notes (Signed)
PROGRESS NOTE  FENIX WILCKEN  BJY:[id number] DOB: 1986-02-01 DOA: 02/27/2023 PCP: [name], FNP   Brief Narrative: Patient is a 37 year old male with history of diabetes type 1, hypertension, CKD stage IIIa with baseline creatinine of 1.5-1.9 who presented to [facility] with complaint of nausea and vomiting for 2 days, unable to tolerate oral, lightheadedness.  On presentation, he was hypertensive.  Lab work showed creatinine of 3.31.  Patient was admitted for the management of AKI and CKD stage IIIa and was started on IV fluids. During physical therapy assessment, became dizzy, found to have high risk for falls.  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  Neurology consulted, stroke workup initiated.  PT recommending acute inpatient rehab.    Assessment & Plan:  Principal Problem:   Acute renal failure superimposed on stage 3a chronic kidney disease (HCC) Active Problems:   DM (diabetes mellitus), type 1 with renal complications (HCC)   Essential hypertension   Intractable nausea and vomiting   Brainstem stroke (HCC)   AKI on CKD stage IIIa: Baseline creatinine around 2.  Follows with Dr. Lequita Halt  at [facility] .He presented with complaint of nausea, vomiting, unable to tolerate oral .  Patient taking benazepril at home.  Currently on hold.  Renal ultrasound was unremarkable for acute changes.  Creatinine has plateaued  round 3.  This might be his new baseline.  He does not look volume overloaded.  Urine sodium more than 20.  We recommend to follow-up with his nephrologist at [facility].  He got tons of fluid in this hospitalization, now on hold  Intractable nausea/vomiting: 2-3 days history of intractable nausea and vomiting.  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  He feels better after starting on Reglan.  Diet advanced to solid  Acute ischemic stroke/dizziness/tendency for falls: Patient became dizzy, lightheaded  during physical therapy evaluation.  At baseline, he is ambulatory without any problem.  During repeat physical therapy assessment on 9/26, he demonstrated tendency for right lateral lean, loss of balance.  CT head did not show any acute findings. 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  Neurology consulted, stroke workup initiated.  MRA of head and neck did not show any acute findings.  Started on aspirin and Plavix, plan for 3 weeks, followed by aspirin alone..  Echo showed EF of 55 to 60%, no intracardiac source of emboli. Continue meclizine for dizziness. LDL of 130, already on Lipitor, neurology sent prior authorization for Leqvio infusion  Type 1 diabetes:  Follows with endocrinology. .  Continue current insulin regimen.  Diabetes is uncontrolled.  Recent A1c of 13.8.  Consulted diabetic coordinator, recommendation for Lantus and novolog  on discharge.  Hypertension: Severely hypertensive on presentation.  Home amlodipine, hydralazine restarted.  Benazepril on hold.  Started on labetalol.  Blood pressure better  Morbid obesity: BMI 36.1      DVT prophylaxis:SCDs Start: 02/27/23 2341     Code Status: Full Code  Family Communication: None at the bedside  Patient status: Inpatient  Patient is from : Home  Anticipated discharge to:  AIR  Estimated DC date: Whenever possible  Consultants: None  Procedures: None  Antimicrobials:  Anti-infectives (From admission, onward)    None       Subjective: Patient seen and examined at bedside today.  Hemodynamically stable.  Lying in bed.  He had some nausea yesterday but denies any vomiting.  Dizziness has been  much better.  I encouraged him to sit in the chair, ambulate if possible.  He has been tolerating solid diet  Objective: Vitals:   03/05/23 1656 03/05/23 2223 03/06/23 0505 03/06/23 0839  BP: (!) 180/98 (!) 169/99 133/72 (!) 163/89  Pulse: 91 90 91 88  Resp: 18 18 18 18    Temp: 98.2 F (36.8 C) 98.7 F (37.1 C) 98.5 F (36.9 C) 98.3 F (36.8 C)  TempSrc:  Oral    SpO2: 98% 100% 94% 93%  Weight:      Height:        Intake/Output Summary (Last 24 hours) at 03/06/2023 1310 Last data filed at 03/06/2023 1100 Gross per 24 hour  Intake 250 ml  Output 700 ml  Net -450 ml   Filed Weights   02/27/23 1719 02/27/23 2321  Weight: 104.3 kg 114.2 kg    Examination:  General exam: Overall comfortable, not in distress, morbidly obese HEENT: PERRL Respiratory system:  no wheezes or crackles  Cardiovascular system: S1 & S2 heard, RRR.  Gastrointestinal system: Abdomen is nondistended, soft and nontender. Central nervous system: Alert and oriented Extremities: No edema, no clubbing ,no cyanosis Skin: No rashes, no ulcers,no icterus     Data Reviewed: I have personally reviewed following labs and imaging studies  CBC: Recent Labs  Lab 02/27/23 1851 02/28/23 0412  WBC 7.7 8.1  NEUTROABS 4.3 4.0  HGB 14.6 12.5*  HCT 43.0 37.1*  MCV 79.2* 79.1*  PLT 420* 346   Basic Metabolic Panel: Recent Labs  Lab 02/28/23 0412 03/01/23 0721 03/02/23 0455 03/03/23 0633 03/04/23 0454 03/05/23 1005 03/06/23 0915  NA 132*   < > 136 135 136 138 138  K 3.5   < > 3.9 3.7 3.9 4.1 3.8  CL 103   < > 106 107 107 108 105  CO2 20*   < > 20* 20* 21* 20* 19*  GLUCOSE 247*   < > 218* 222* 203* 225* 195*  BUN 39*   < > 30* 27* 27* 32* 30*  CREATININE 3.09*   < > 3.17* 3.07* 3.19* 3.05* 3.07*  CALCIUM 8.2*   < > 8.3* 8.6* 8.3* 8.8* 8.7*  MG 1.9  --   --   --   --   --   --    < > = values in this interval not displayed.     Recent Results (from the past 240 hour(s))  Resp panel by RT-PCR (RSV, Flu A&B, Covid) Anterior Nasal Swab     Status: None   Collection Time: 02/27/23  5:22 PM   Specimen: Anterior Nasal Swab  Result Value Ref Range Status   SARS Coronavirus 2 by RT PCR NEGATIVE NEGATIVE Final    Comment: (NOTE) SARS-CoV-2 target nucleic acids are NOT  DETECTED.  The SARS-CoV-2 RNA is generally detectable in upper respiratory specimens during the acute phase of infection. The lowest concentration of SARS-CoV-2 viral copies this assay can detect is 138 copies/mL. A negative result does not preclude SARS-Cov-2 infection and should not be used as the sole basis for treatment or other patient management decisions. A negative result may occur with  improper specimen collection/handling, submission of specimen other than nasopharyngeal swab, presence of viral mutation(s) within the areas targeted by this assay, and inadequate number of viral copies(<138 copies/mL). A negative result must be combined with clinical observations, patient history, and epidemiological information. The expected result is Negative.  Fact Sheet for Patients:  BloggerCourse.com  Fact Sheet for Healthcare  Providers:  SeriousBroker.it  This test is no t yet approved or cleared by the [place] and  has been authorized for detection and/or diagnosis of SARS-CoV-2 by FDA under an Emergency Use Authorization (EUA). This EUA will remain  in effect (meaning this test can be used) for the duration of the COVID-19 declaration under Section 564(b)(1) of the Act, 21 U.S.C.section 360bbb-3(b)(1), unless the authorization is terminated  or revoked sooner.       Influenza A by PCR NEGATIVE NEGATIVE Final   Influenza B by PCR NEGATIVE NEGATIVE Final    Comment: (NOTE) The Xpert Xpress SARS-CoV-2/FLU/RSV plus assay is intended as an aid in the diagnosis of influenza from Nasopharyngeal swab specimens and should not be used as a sole basis for treatment. Nasal washings and aspirates are unacceptable for Xpert Xpress SARS-CoV-2/FLU/RSV testing.  Fact Sheet for Patients: BloggerCourse.com  Fact Sheet for Healthcare Providers: SeriousBroker.it  This test is not yet  approved or cleared by the [place] FDA and has been authorized for detection and/or diagnosis of SARS-CoV-2 by FDA under an Emergency Use Authorization (EUA). This EUA will remain in effect (meaning this test can be used) for the duration of the COVID-19 declaration under Section 564(b)(1) of the Act, 21 U.S.C. section 360bbb-3(b)(1), unless the authorization is terminated or revoked.     Resp Syncytial Virus by PCR NEGATIVE NEGATIVE Final    Comment: (NOTE) Fact Sheet for Patients: BloggerCourse.com  Fact Sheet for Healthcare Providers: SeriousBroker.it  This test is not yet approved or cleared by the [place] FDA and has been authorized for detection and/or diagnosis of SARS-CoV-2 by FDA under an Emergency Use Authorization (EUA). This EUA will remain in effect (meaning this test can be used) for the duration of the COVID-19 declaration under Section 564(b)(1) of the Act, 21 U.S.C. section 360bbb-3(b)(1), unless the authorization is terminated or revoked.  Performed at [facility], [address] [place], [place] [address]      Radiology Studies: ECHOCARDIOGRAM COMPLETE BUBBLE STUDY  Result Date: 03/05/2023    ECHOCARDIOGRAM REPORT   Patient Name:   LANORRIS KRAJICEK Date of Exam: 03/05/2023 Medical Rec #:  [id number]      Height:       70.0 in Accession #:    [id number]     Weight:       251.7 lb Date of Birth:  1985-09-15      BSA:          2.302 m Patient Age:    37 years       BP:           49/82 mmHg Patient Gender: M              HR:           89 bpm. Exam Location:  Inpatient Procedure: 2D Echo, Color Doppler, Cardiac Doppler and Saline Contrast Bubble            Study Indications:    Stroke i63.9  History:        Patient has no prior history of Echocardiogram examinations.                 Risk Factors:Hypertension and Diabetes.  Sonographer:    Irving Burton Senior RDCS Referring Phys: [phone] [place]  IMPRESSIONS  1. Left ventricular ejection fraction, by estimation, is 55 to 60%. The left ventricle has normal function. The left ventricle has no regional wall motion abnormalities. Left ventricular  diastolic parameters were normal.  2. Right ventricular systolic function is normal. The right ventricular size is normal. Tricuspid regurgitation signal is inadequate for assessing PA pressure.  3. The mitral valve is normal in structure. No evidence of mitral valve regurgitation.  4. The aortic valve is tricuspid. Aortic valve regurgitation is not visualized.  5. The inferior vena cava is normal in size with <50% respiratory variability, suggesting right atrial pressure of 8 mmHg.  6. Agitated saline contrast bubble study was negative, with no evidence of any interatrial shunt. Comparison(s): No prior Echocardiogram. FINDINGS  Left Ventricle: Left ventricular ejection fraction, by estimation, is 55 to 60%. The left ventricle has normal function. The left ventricle has no regional wall motion abnormalities. The left ventricular internal cavity size was normal in size. There is  no left ventricular hypertrophy. Left ventricular diastolic parameters were normal. Right Ventricle: The right ventricular size is normal. No increase in right ventricular wall thickness. Right ventricular systolic function is normal. Tricuspid regurgitation signal is inadequate for assessing PA pressure. Left Atrium: Left atrial size was normal in size. Right Atrium: Right atrial size was normal in size. Pericardium: Trivial pericardial effusion is present. The pericardial effusion is posterior to the left ventricle. Mitral Valve: The mitral valve is normal in structure. No evidence of mitral valve regurgitation. Tricuspid Valve: The tricuspid valve is normal in structure. Tricuspid valve regurgitation is trivial. Aortic Valve: The aortic valve is tricuspid. Aortic valve regurgitation is not visualized. Pulmonic Valve: The pulmonic valve was  normal in structure. Pulmonic valve regurgitation is trivial. Aorta: The aortic root, ascending aorta and aortic arch are all structurally normal, with no evidence of dilitation or obstruction. Venous: The inferior vena cava is normal in size with less than 50% respiratory variability, suggesting right atrial pressure of 8 mmHg. IAS/Shunts: The interatrial septum was not well visualized. Agitated saline contrast was given intravenously to evaluate for intracardiac shunting. Agitated saline contrast bubble study was negative, with no evidence of any interatrial shunt.  LEFT VENTRICLE PLAX 2D LVIDd:         5.10 cm   Diastology LVIDs:         3.80 cm   LV e' medial:    7.07 cm/s LV PW:         1.00 cm   LV E/e' medial:  9.9 LV IVS:        0.90 cm   LV e' lateral:   14.50 cm/s LVOT diam:     2.30 cm   LV E/e' lateral: 4.8 LV SV:         68 LV SV Index:   30 LVOT Area:     4.15 cm  RIGHT VENTRICLE RV S prime:     16.40 cm/s TAPSE (M-mode): 2.0 cm LEFT ATRIUM             Index        RIGHT ATRIUM           Index LA diam:        4.30 cm 1.87 cm/m   RA Area:     11.30 cm LA Vol (A2C):   44.2 ml 19.20 ml/m  RA Volume:   22.50 ml  9.78 ml/m LA Vol (A4C):   51.7 ml 22.46 ml/m LA Biplane Vol: 52.1 ml 22.64 ml/m  AORTIC VALVE LVOT Vmax:   87.35 cm/s LVOT Vmean:  64.100 cm/s LVOT VTI:    0.164 m  AORTA Ao Root diam: 3.10 cm Ao Asc  diam:  3.00 cm MITRAL VALVE MV Area (PHT): 3.06 cm    SHUNTS MV Decel Time: 248 msec    Systemic VTI:  0.16 m MV E velocity: 69.80 cm/s  Systemic Diam: 2.30 cm MV A velocity: 63.40 cm/s MV E/A ratio:  1.10 Riley Lam MD Electronically signed by Riley Lam MD Signature Date/Time: 03/05/2023/3:29:45 PM    Final    MR ANGIO HEAD WO CONTRAST  Result Date: 03/04/2023 CLINICAL DATA:  Stroke follow-up EXAM: MRA NECK WITHOUT CONTRAST MRA HEAD WITHOUT CONTRAST TECHNIQUE: Angiographic images of the Circle of Willis were acquired using MRA technique without intravenous contrast.  COMPARISON:  None Available. FINDINGS: MRA NECK FINDINGS Aortic arch: Normal Right carotid system: Normal Left carotid system: Normal Vertebral arteries: Right dominant.  Patent with antegrade flow Other: None. MRA HEAD FINDINGS POSTERIOR CIRCULATION: --Vertebral arteries: Normal --Inferior cerebellar arteries: Normal. --Basilar artery: Normal. --Superior cerebellar arteries: Normal. --Posterior cerebral arteries: Normal. ANTERIOR CIRCULATION: --Intracranial internal carotid arteries: Normal. --Anterior cerebral arteries (ACA): Normal. --Middle cerebral arteries (MCA): Normal. Other: None. IMPRESSION: Normal MRA of the head and neck. Electronically Signed   By: Deatra Robinson M.D.   On: 03/04/2023 23:01   MR ANGIO NECK WO CONTRAST  Result Date: 03/04/2023 CLINICAL DATA:  Stroke follow-up EXAM: MRA NECK WITHOUT CONTRAST MRA HEAD WITHOUT CONTRAST TECHNIQUE: Angiographic images of the Circle of Willis were acquired using MRA technique without intravenous contrast. COMPARISON:  None Available. FINDINGS: MRA NECK FINDINGS Aortic arch: Normal Right carotid system: Normal Left carotid system: Normal Vertebral arteries: Right dominant.  Patent with antegrade flow Other: None. MRA HEAD FINDINGS POSTERIOR CIRCULATION: --Vertebral arteries: Normal --Inferior cerebellar arteries: Normal. --Basilar artery: Normal. --Superior cerebellar arteries: Normal. --Posterior cerebral arteries: Normal. ANTERIOR CIRCULATION: --Intracranial internal carotid arteries: Normal. --Anterior cerebral arteries (ACA): Normal. --Middle cerebral arteries (MCA): Normal. Other: None. IMPRESSION: Normal MRA of the head and neck. Electronically Signed   By: Deatra Robinson M.D.   On: 03/04/2023 23:01    Scheduled Meds:  amLODipine  10 mg Oral Daily   aspirin EC  81 mg Oral Daily   atorvastatin  80 mg Oral Daily   clopidogrel  75 mg Oral Daily   hydrALAZINE  100 mg Oral Q8H   insulin aspart  0-5 Units Subcutaneous QHS   insulin aspart  0-9 Units  Subcutaneous TID WC   insulin detemir  18 Units Subcutaneous BID   labetalol  300 mg Oral Q8H   polyethylene glycol  17 g Oral Daily   senna  1 tablet Oral BID   sodium bicarbonate  650 mg Oral BID   Continuous Infusions:     LOS: 7 days   Burnadette Pop, MD [facility] P8/28/2024, 1:10 PM

## 2023-03-06 NOTE — TOC Progression Note (Signed)
Transition of Care [facility]) - Progression Note    Patient Details  Name: Bradley Davis MRN: [id number] Date of Birth: [birth year]  Transition of [facility] CM/SW Contact  Tom-Johnson, Hershal Coria, RN Phone Number: 03/06/2023, 2:57 PM  Clinical Narrative:     CIR received Insurance Auth approval. Will admit when bed becomes available.   CM will continue to follow as patient progresses with care towards discharge.       Expected Discharge Plan and Services                                               Social Determinants of Health (SDOH) Interventions SDOH Screenings   Food Insecurity: No Food Insecurity (02/27/2023)  Housing: Low Risk  (02/27/2023)  Transportation Needs: No Transportation Needs (02/27/2023)  Utilities: Not At Risk (02/27/2023)  Depression (PHQ2-9): Low Risk  (06/06/2021)  Social Connections: Unknown (11/17/2021)   Received from [facility], [facility]  Tobacco Use: Low Risk  (02/27/2023)    Readmission Risk Interventions    02/28/2023    2:56 PM  Readmission Risk Prevention Plan  Post Dischage Appt Complete  Medication Screening Complete  Transportation Screening Complete

## 2023-03-06 NOTE — Progress Notes (Signed)
PT Cancellation Note  Patient Details Name: Bradley Davis MRN: [id number] DOB: [birth year]   Cancelled Treatment:    Reason Eval/Treat Not Completed: (P) Other (comment) (pt with episode nausea with OT (RN aware and to give anti-nausea meds). Secretary notified he has order for compression socks from 8/26 in but not in his room yet.) Will continue efforts per PT plan of care as schedule permit.   Dorathy Kinsman Saidy Ormand 03/06/2023, 4:59 PM

## 2023-03-06 NOTE — Progress Notes (Signed)
Occupational Therapy Treatment Patient Details Name: Bradley Davis MRN: [id number] DOB: 1986-01-31 Today's Date: 03/06/2023   History of present illness 37 y.o. male presents to [facility] hospital on 02/27/2023 with nausea and vomiting for 2 days. Pt was unable to tolerate oral intake and was lightheaded. Pt was hypertensive on evaluation with elevated creatinine. Pt was admitted for AKI and CKD stage III. 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  PMH: DM 1, HTN, CKD III.   OT comments  Pt continuing to progress in OT sessions, he remains determined to challenge self in therapy but likely mostly interested in return to work. OT challenging pt balance by simulating carrying and picking up objects, like what his work may entail, Pt continues to have LOBs to his R side and needs Min A to help recover or maintain balance at times. During session pt became quite nauseous prompting an end to the session but RN made aware and able to provide pt with medications. OT to continue to progress pt as able, DC plans remain appropriate for [facility].        If plan is discharge home, recommend the following:  Assistance with cooking/housework;Assist for transportation;Help with stairs or ramp for entrance;A little help with walking and/or transfers;A lot of help with bathing/dressing/bathroom   Equipment Recommendations  Other (comment) (defer)    Recommendations for Other Services      Precautions / Restrictions Precautions Precautions: Fall Precaution Comments: R medullary CVA with vestibular symptoms Restrictions Weight Bearing Restrictions: No       Mobility Bed Mobility Overal bed mobility: Needs Assistance Bed Mobility: Supine to Sit, Sit to Supine     Supine to sit: Supervision Sit to supine: Supervision        Transfers Overall transfer level: Needs assistance Equipment used: Rolling walker (2 wheels) Transfers: Sit to/from Stand Sit to Stand: Contact guard assist                  Balance Overall balance assessment: Needs assistance Sitting-balance support: No upper extremity supported, Feet supported Sitting balance-Leahy Scale: Good     Standing balance support: No upper extremity supported, During functional activity Standing balance-Leahy Scale: Fair Standing balance comment: stands and may even ambulate without UE support but has LOBs                           ADL either performed or assessed with clinical judgement   ADL Overall ADL's : Needs assistance/impaired     Grooming: Contact guard assist;Wash/dry face;Standing Grooming Details (indicate cue type and reason): standing at sink                             Functional mobility during ADLs: Contact guard assist;Rolling walker (2 wheels) General ADL Comments: Pt declined need for dressing ADLs at this time, focused session on progressing with balance. Pt ambulating in hall carrying backpack (to simulate what he carries for work) on his L and R shoulder with minor LOBs noted throoughout ambulation needing min A to recover. Then proceeded to work on picking up light weight objects from floor level to which he needed min A to help maintain balance    Extremity/Trunk Assessment              Vision       Perception     Praxis      Cognition Arousal:  Alert Behavior During Therapy: WFL for tasks assessed/performed Overall Cognitive Status: Within Functional Limits for tasks assessed                                          Exercises      Shoulder Instructions       General Comments Pt nauseous at end of OT session, BP 138/106, RN notified of nausea and administering medications for pt.    Pertinent Vitals/ Pain       Pain Assessment Pain Assessment: No/denies pain  Home Living                                          Prior Functioning/Environment              Frequency           Progress Toward  Goals  OT Goals(current goals can now be found in the care plan section)  Progress towards OT goals: Progressing toward goals  Acute Rehab OT Goals Patient Stated Goal: to figure out whats going on OT Goal Formulation: With patient Time For Goal Achievement: 03/18/23 Potential to Achieve Goals: Good ADL Goals Pt Will Perform Grooming: with modified independence;standing Pt Will Perform Lower Body Dressing: with modified independence;sit to/from stand Pt Will Transfer to Toilet: with modified independence Additional ADL Goal #1: Pt will complete 4 step meal prep with mod I to demonstrate ability to safely return to work  Plan         AM-PAC OT "6 Clicks" Daily Activity     Outcome Measure   Help from another person eating meals?: None Help from another person taking care of personal grooming?: A Little Help from another person toileting, which includes using toliet, bedpan, or urinal?: A Little Help from another person bathing (including washing, rinsing, drying)?: A Lot Help from another person to put on and taking off regular upper body clothing?: A Little Help from another person to put on and taking off regular lower body clothing?: A Lot 6 Click Score: 17    End of Session Equipment Utilized During Treatment: Gait belt  OT Visit Diagnosis: Unsteadiness on feet (R26.81);Other abnormalities of gait and mobility (R26.89);Muscle weakness (generalized) (M62.81);Dizziness and giddiness (R42)   Activity Tolerance Other (comment) (Limited by nausea)   Patient Left     Nurse Communication Mobility status        Time: 1027-2536 OT Time Calculation (min): 23 min  Charges: OT General Charges $OT Visit: 1 Visit OT Treatments $Self Care/Home Management : 8-22 mins $Therapeutic Activity: 8-22 mins  03/06/2023  AB, OTR/L  Acute Rehabilitation Services  Office: [phone]   Tristan Schroeder 03/06/2023, 5:43 PM

## 2023-03-06 NOTE — Inpatient Diabetes Management (Signed)
Inpatient Diabetes Program Recommendations  AACE/ADA: New Consensus Statement on Inpatient Glycemic Control (2015)  Target Ranges:  Prepandial:   less than 140 mg/dL      Peak postprandial:   less than 180 mg/dL (1-2 hours)      Critically ill patients:  140 - 180 mg/dL   Lab Results  Component Value Date   GLUCAP 197 (H) 03/06/2023   HGBA1C 13.8 (H) 02/28/2023    Latest Reference Range & Units 03/05/23 07:32 03/05/23 11:17 03/05/23 16:50 03/05/23 22:24 03/06/23 07:25 03/06/23 11:12  Glucose-Capillary 70 - 99 mg/dL 811 (H) 914 (H) 782 (H) 209 (H) 180 (H) 197 (H)   Diabetes history: DM type 1 Outpatient Diabetes medications: Lantus 40 units qhs, Humalog 0-30 units tid + hs Current orders for Inpatient glycemic control:  Levemir 15 units bid Novolog 0-9 units tid + hs  -   Increase Levemir to 18 units bid -   Add Novolog 2 units tid meal coverage if eating >50% of meals   Will continue to follow while inpatient.  Thanks,  Christena Deem RN, MSN, BC-ADM Inpatient Diabetes Coordinator Team Pager [phone] (8a-5p)

## 2023-03-06 NOTE — Progress Notes (Signed)
Inpatient Rehabilitation Admissions Coordinator   I have insurance approval for CIR admit and await bed availablity over the next 1 to 2 days to admit.  Ottie Glazier, RN, MSN Rehab Admissions Coordinator [phone] 03/06/2023 2:56 PM

## 2023-03-07 DIAGNOSIS — G4733 Obstructive sleep apnea (adult) (pediatric): Secondary | ICD-10-CM | POA: Diagnosis not present

## 2023-03-07 DIAGNOSIS — N1831 Chronic kidney disease, stage 3a: Secondary | ICD-10-CM | POA: Diagnosis not present

## 2023-03-07 DIAGNOSIS — N179 Acute kidney failure, unspecified: Secondary | ICD-10-CM | POA: Diagnosis not present

## 2023-03-07 LAB — GLUCOSE, CAPILLARY
Glucose-Capillary: 210 mg/dL — ABNORMAL HIGH (ref 70–99)
Glucose-Capillary: 278 mg/dL — ABNORMAL HIGH (ref 70–99)
Glucose-Capillary: 120 mg/dL — ABNORMAL HIGH (ref 70–99)
Glucose-Capillary: 131 mg/dL — ABNORMAL HIGH (ref 70–99)

## 2023-03-07 MED ORDER — INSULIN ASPART 100 UNIT/ML IJ SOLN: 0.0000 [IU] | Freq: Every day | INTRAMUSCULAR | Status: DC

## 2023-03-07 MED ORDER — INSULIN ASPART 100 UNIT/ML IJ SOLN
2.0000 [IU] | Freq: Three times a day (TID) | INTRAMUSCULAR | Status: DC
  Administered 2023-03-07 – 2023-03-08 (×3): 2 [IU] via SUBCUTANEOUS

## 2023-03-07 MED ORDER — HYDRALAZINE HCL 100 MG PO TABS: 100.0000 mg | ORAL_TABLET | Freq: Three times a day (TID) | ORAL | Status: DC

## 2023-03-07 MED ORDER — INSULIN ASPART 100 UNIT/ML IJ SOLN: 0.0000 [IU] | Freq: Three times a day (TID) | INTRAMUSCULAR | Status: DC

## 2023-03-07 MED ORDER — CLOPIDOGREL BISULFATE 75 MG PO TABS: 75.0000 mg | ORAL_TABLET | Freq: Every day | ORAL | Status: DC

## 2023-03-07 MED ORDER — MECLIZINE HCL 25 MG PO TABS: 25.0000 mg | ORAL_TABLET | Freq: Three times a day (TID) | ORAL | Status: DC | PRN

## 2023-03-07 MED ORDER — LABETALOL HCL 300 MG PO TABS: 300.0000 mg | ORAL_TABLET | Freq: Three times a day (TID) | ORAL | Status: DC

## 2023-03-07 MED ORDER — SENNA 8.6 MG PO TABS: 1.0000 | ORAL_TABLET | Freq: Two times a day (BID) | ORAL | Status: DC

## 2023-03-07 MED ORDER — INSULIN DETEMIR 100 UNIT/ML ~~LOC~~ SOLN
20.0000 [IU] | Freq: Two times a day (BID) | SUBCUTANEOUS | Status: DC
  Administered 2023-03-07 – 2023-03-08 (×2): 20 [IU] via SUBCUTANEOUS
  Filled 2023-03-07 (×3): qty 0.2

## 2023-03-07 MED ORDER — METOCLOPRAMIDE HCL 10 MG PO TABS: 10.0000 mg | ORAL_TABLET | Freq: Four times a day (QID) | ORAL | Status: DC | PRN

## 2023-03-07 MED ORDER — ASPIRIN 81 MG PO TBEC: 81.0000 mg | DELAYED_RELEASE_TABLET | Freq: Every day | ORAL | Status: DC

## 2023-03-07 MED ORDER — POLYETHYLENE GLYCOL 3350 17 G PO PACK: 17.0000 g | PACK | Freq: Every day | ORAL | Status: DC

## 2023-03-07 MED ORDER — SODIUM BICARBONATE 650 MG PO TABS: 650.0000 mg | ORAL_TABLET | Freq: Three times a day (TID) | ORAL | Status: DC

## 2023-03-07 MED ORDER — INSULIN ASPART 100 UNIT/ML IJ SOLN: 2.0000 [IU] | Freq: Three times a day (TID) | INTRAMUSCULAR | Status: DC

## 2023-03-07 NOTE — Inpatient Diabetes Management (Signed)
Inpatient Diabetes Program Recommendations  AACE/ADA: New Consensus Statement on Inpatient Glycemic Control (2015)  Target Ranges:  Prepandial:   less than 140 mg/dL      Peak postprandial:   less than 180 mg/dL (1-2 hours)      Critically ill patients:  140 - 180 mg/dL   Lab Results  Component Value Date   GLUCAP 278 (H) 03/07/2023   HGBA1C 13.8 (H) 02/28/2023    Latest Reference Range & Units 03/06/23 07:25 03/06/23 11:12 03/06/23 16:36 03/06/23 21:04 03/07/23 07:22 03/07/23 11:10  Glucose-Capillary 70 - 99 mg/dL 161 (H) 096 (H) 045 (H) 244 (H) 210 (H) 278 (H)    Diabetes history: DM type 1 Outpatient Diabetes medications: Lantus 40 units qhs, Humalog 0-30 units tid + hs Current orders for Inpatient glycemic control:  Levemir 18 units bid Novolog 0-9 units tid + hs  -   Add Novolog 2 units tid meal coverage if eating >50% of meals  Will continue to follow while inpatient.  Thanks,  Christena Deem RN, MSN, BC-ADM Inpatient Diabetes Coordinator Team Pager [phone] (8a-5p)

## 2023-03-07 NOTE — Plan of Care (Signed)
  Problem: Health Behavior/Discharge Planning: Goal: Ability to manage health-related needs will improve Outcome: Progressing   Problem: Clinical Measurements: Goal: Ability to maintain clinical measurements within normal limits will improve Outcome: Progressing   Problem: Fluid Volume: Goal: Ability to maintain a balanced intake and output will improve Outcome: Progressing   Problem: Health Behavior/Discharge Planning: Goal: Ability to identify and utilize available resources and services will improve Outcome: Progressing Goal: Ability to manage health-related needs will improve Outcome: Progressing   Problem: Metabolic: Goal: Ability to maintain appropriate glucose levels will improve Outcome: Progressing   Problem: Nutritional: Goal: Maintenance of adequate nutrition will improve Outcome: Progressing Goal: Progress toward achieving an optimal weight will improve Outcome: Progressing   Problem: Skin Integrity: Goal: Risk for impaired skin integrity will decrease Outcome: Progressing   Problem: Tissue Perfusion: Goal: Adequacy of tissue perfusion will improve Outcome: Progressing   Problem: Education: Goal: Knowledge of disease or condition will improve Outcome: Progressing Goal: Knowledge of secondary prevention will improve (MUST DOCUMENT ALL) Outcome: Progressing Goal: Knowledge of patient specific risk factors will improve Loraine Leriche N/A or DELETE if not current risk factor) Outcome: Progressing   Problem: Ischemic Stroke/TIA Tissue Perfusion: Goal: Complications of ischemic stroke/TIA will be minimized Outcome: Progressing   Problem: Coping: Goal: Will verbalize positive feelings about self Outcome: Progressing Goal: Will identify appropriate support needs Outcome: Progressing   Problem: Health Behavior/Discharge Planning: Goal: Ability to manage health-related needs will improve Outcome: Progressing Goal: Goals will be collaboratively established with  patient/family Outcome: Progressing   Problem: Self-Care: Goal: Ability to participate in self-care as condition permits will improve Outcome: Progressing Goal: Verbalization of feelings and concerns over difficulty with self-care will improve Outcome: Progressing Goal: Ability to communicate needs accurately will improve Outcome: Progressing   Problem: Nutrition: Goal: Risk of aspiration will decrease Outcome: Progressing Goal: Dietary intake will improve Outcome: Progressing

## 2023-03-07 NOTE — Discharge Summary (Addendum)
Physician Discharge Summary  DOHN WEIDERT XBJ:[id number] DOB: 1986/06/03 DOA: 02/27/2023  PCP: [name], FNP  Admit date: 02/27/2023 Discharge date: 03/08/2023  Admitted From: Home Disposition:  CIR  Discharge Condition:Stable CODE STATUS:FULL Diet recommendation: Carb Modified  Brief/Interim Summary: Patient is a 37 year old male with history of diabetes type 1, hypertension, CKD stage IIIa with baseline creatinine of 1.5-1.9 who presented to [facility] with complaint of nausea and vomiting for 2 days, unable to tolerate oral, lightheadedness. On presentation, he was hypertensive. Lab work showed creatinine of 3.31. Patient was admitted for the management of AKI and CKD stage IIIa and was started on IV fluids. During physical therapy assessment, became dizzy, found to have high risk for falls. 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 Neurology consulted, stroke workup initiated and completed.  PT/OT recommending CIR on discharge.  Medically stable for dc whenever possible  Following problems were addressed during the hospitalization:  AKI on CKD stage IIIa: Baseline creatinine around 2.  Follows with Dr. Lequita Halt  at [facility] .He presented with complaint of nausea, vomiting, unable to tolerate oral .  Patient taking benazepril at home.  Currently on hold.  Renal ultrasound was unremarkable for acute changes.  Creatinine has plateaued  round 3.  This might be his new baseline.  He does not look volume overloaded.  Urine sodium more than 20.  We recommend to follow-up with his nephrologist at [facility].     Intractable nausea/vomiting: 2-3 days history of intractable nausea and vomiting.  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  He feels better after starting on Reglan.  Diet advanced to solid.  He has been advised to take small-volume low fat meals   Acute ischemic stroke/dizziness/tendency for falls: Patient became dizzy,  lightheaded during physical therapy evaluation.  At baseline, he is ambulatory without any problem.  During repeat physical therapy assessment on 9/26, he demonstrated tendency for right lateral lean, loss of balance.  CT head did not show any acute findings. 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  Neurology consulted, stroke workup initiated.  MRA of head and neck did not show any acute findings.  Started on aspirin and Plavix, plan for 3 weeks, followed by aspirin alone..  Echo showed EF of 55 to 60%, no intracardiac source of emboli. Continue as needed  meclizine for dizziness. LDL of 130, already on Lipitor, neurology sent prior authorization for Leqvio infusion. He needs to follow-up with neurology as an outpatient   Type 1 diabetes:  Follows with endocrinology. .  Continue current insulin regimen.  Diabetes is uncontrolled.  Recent A1c of 13.8.  Consulted diabetic coordinator, obtained recommendation for Lantus and novolog  on discharge.   Hypertension: Severely hypertensive on presentation.  Home amlodipine, hydralazine restarted.  Benazepril on hold.  Started on labetalol.  Blood pressure better   Morbid obesity: BMI 36.1  Discharge Diagnoses:  Principal Problem:   Acute renal failure superimposed on stage 3a chronic kidney disease (HCC) Active Problems:   DM (diabetes mellitus), type 1 with renal complications (HCC)   Essential hypertension   Intractable nausea and vomiting   Brainstem stroke (HCC)   OSA (obstructive sleep apnea)    Discharge Instructions  Discharge Instructions     Diet Carb Modified   Complete by: As directed    Discharge instructions   Complete by: As directed    1)Please take your medications as instructed 2)Monitor your  blood pressure and blood sugars.  Check BMP test in 3 to 4 days 3)Follow up with neurology in 4 weeks.  Name and number for the provider group has been attached 4)Follow up with her  nephrologist in 4 weeks.   Increase activity slowly   Complete by: As directed       Allergies as of 03/08/2023       Reactions   Ibuprofen Swelling   Pt reports taking after reported allergy w/ no reactions        Medication List     STOP taking these medications    benazepril 20 MG tablet Commonly known as: LOTENSIN   HumaLOG KwikPen 100 UNIT/ML KwikPen Generic drug: insulin lispro   hydrochlorothiazide 25 MG tablet Commonly known as: HYDRODIURIL       TAKE these medications    acetaminophen 325 MG tablet Commonly known as: TYLENOL Take 650 mg by mouth as needed for mild pain or headache.   amLODipine 10 MG tablet Commonly known as: NORVASC Take 1 tablet (10 mg total) by mouth daily.   aspirin EC 81 MG tablet Take 1 tablet (81 mg total) by mouth daily. Swallow whole.   atorvastatin 80 MG tablet Commonly known as: LIPITOR Take 1 tablet (80 mg total) by mouth daily.   Basaglar KwikPen 100 UNIT/ML Inject 40 Units into the skin at bedtime.   clopidogrel 75 MG tablet Commonly known as: PLAVIX Take 1 tablet (75 mg total) by mouth daily. Stop after 17 days   hydrALAZINE 100 MG tablet Commonly known as: APRESOLINE Take 1 tablet (100 mg total) by mouth every 8 (eight) hours.   insulin aspart 100 UNIT/ML injection Commonly known as: novoLOG Inject 0-5 Units into the skin at bedtime.   insulin aspart 100 UNIT/ML injection Commonly known as: novoLOG Inject 0-9 Units into the skin 3 (three) times daily with meals.   insulin aspart 100 UNIT/ML injection Commonly known as: novoLOG Inject 2 Units into the skin 3 (three) times daily with meals.   labetalol 300 MG tablet Commonly known as: NORMODYNE Take 1 tablet (300 mg total) by mouth every 8 (eight) hours.   meclizine 25 MG tablet Commonly known as: ANTIVERT Take 1 tablet (25 mg total) by mouth 3 (three) times daily as needed for dizziness.   metoCLOPramide 10 MG tablet Commonly known as:  REGLAN Take 1 tablet (10 mg total) by mouth every 6 (six) hours as needed for nausea.   polyethylene glycol 17 g packet Commonly known as: MIRALAX / GLYCOLAX Take 17 g by mouth daily.   senna 8.6 MG Tabs tablet Commonly known as: SENOKOT Take 1 tablet (8.6 mg total) by mouth 2 (two) times daily.   sildenafil 50 MG tablet Commonly known as: VIAGRA Take 50 mg by mouth as needed for erectile dysfunction.   sodium bicarbonate 650 MG tablet Take 1 tablet (650 mg total) by mouth 3 (three) times daily.   sodium chloride 0.65 % nasal spray Commonly known as: OCEAN Place 2 sprays into the nose daily as needed for congestion.   Vitamin D (Ergocalciferol) 1.25 MG (50000 UNIT) Caps capsule Commonly known as: DRISDOL Take 50,000 Units by mouth once a week. Monday        Follow-up Information     [facility]. Schedule an appointment as soon as possible for a visit in 4 week(s).   Specialty: Neurology Contact information: [address] [place] [address] [phone]  Care, [facility] .   Specialty: Skilled Nursing Facility Contact information: [address] [place] [address] [phone]                Allergies  Allergen Reactions   Ibuprofen Swelling    Pt reports taking after reported allergy w/ no reactions    Consultations: Neurology   Procedures/Studies: ECHOCARDIOGRAM COMPLETE BUBBLE STUDY  Result Date: 03/05/2023    ECHOCARDIOGRAM REPORT   Patient Name:   JAMIESON CONCHA Date of Exam: 03/05/2023 Medical Rec #:  [id number]      Height:       70.0 in Accession #:    [id number]     Weight:       251.7 lb Date of Birth:  10-09-1985      BSA:          2.302 m Patient Age:    37 years       BP:           49/82 mmHg Patient Gender: M              HR:           89 bpm. Exam Location:  Inpatient Procedure: 2D Echo, Color Doppler, Cardiac Doppler and Saline Contrast Bubble            Study  Indications:    Stroke i63.9  History:        Patient has no prior history of Echocardiogram examinations.                 Risk Factors:Hypertension and Diabetes.  Sonographer:    Irving Burton Senior RDCS Referring Phys: [phone] Cleora Karnik IMPRESSIONS  1. Left ventricular ejection fraction, by estimation, is 55 to 60%. The left ventricle has normal function. The left ventricle has no regional wall motion abnormalities. Left ventricular diastolic parameters were normal.  2. Right ventricular systolic function is normal. The right ventricular size is normal. Tricuspid regurgitation signal is inadequate for assessing PA pressure.  3. The mitral valve is normal in structure. No evidence of mitral valve regurgitation.  4. The aortic valve is tricuspid. Aortic valve regurgitation is not visualized.  5. The inferior vena cava is normal in size with <50% respiratory variability, suggesting right atrial pressure of 8 mmHg.  6. Agitated saline contrast bubble study was negative, with no evidence of any interatrial shunt. Comparison(s): No prior Echocardiogram. FINDINGS  Left Ventricle: Left ventricular ejection fraction, by estimation, is 55 to 60%. The left ventricle has normal function. The left ventricle has no regional wall motion abnormalities. The left ventricular internal cavity size was normal in size. There is  no left ventricular hypertrophy. Left ventricular diastolic parameters were normal. Right Ventricle: The right ventricular size is normal. No increase in right ventricular wall thickness. Right ventricular systolic function is normal. Tricuspid regurgitation signal is inadequate for assessing PA pressure. Left Atrium: Left atrial size was normal in size. Right Atrium: Right atrial size was normal in size. Pericardium: Trivial pericardial effusion is present. The pericardial effusion is posterior to the left ventricle. Mitral Valve: The mitral valve is normal in structure. No evidence of mitral valve  regurgitation. Tricuspid Valve: The tricuspid valve is normal in structure. Tricuspid valve regurgitation is trivial. Aortic Valve: The aortic valve is tricuspid. Aortic valve regurgitation is not visualized. Pulmonic Valve: The pulmonic valve was normal in structure. Pulmonic valve regurgitation is trivial. Aorta: The aortic root, ascending aorta and aortic arch are all structurally normal, with no  evidence of dilitation or obstruction. Venous: The inferior vena cava is normal in size with less than 50% respiratory variability, suggesting right atrial pressure of 8 mmHg. IAS/Shunts: The interatrial septum was not well visualized. Agitated saline contrast was given intravenously to evaluate for intracardiac shunting. Agitated saline contrast bubble study was negative, with no evidence of any interatrial shunt.  LEFT VENTRICLE PLAX 2D LVIDd:         5.10 cm   Diastology LVIDs:         3.80 cm   LV e' medial:    7.07 cm/s LV PW:         1.00 cm   LV E/e' medial:  9.9 LV IVS:        0.90 cm   LV e' lateral:   14.50 cm/s LVOT diam:     2.30 cm   LV E/e' lateral: 4.8 LV SV:         68 LV SV Index:   30 LVOT Area:     4.15 cm  RIGHT VENTRICLE RV S prime:     16.40 cm/s TAPSE (M-mode): 2.0 cm LEFT ATRIUM             Index        RIGHT ATRIUM           Index LA diam:        4.30 cm 1.87 cm/m   RA Area:     11.30 cm LA Vol (A2C):   44.2 ml 19.20 ml/m  RA Volume:   22.50 ml  9.78 ml/m LA Vol (A4C):   51.7 ml 22.46 ml/m LA Biplane Vol: 52.1 ml 22.64 ml/m  AORTIC VALVE LVOT Vmax:   87.35 cm/s LVOT Vmean:  64.100 cm/s LVOT VTI:    0.164 m  AORTA Ao Root diam: 3.10 cm Ao Asc diam:  3.00 cm MITRAL VALVE MV Area (PHT): 3.06 cm    SHUNTS MV Decel Time: 248 msec    Systemic VTI:  0.16 m MV E velocity: 69.80 cm/s  Systemic Diam: 2.30 cm MV A velocity: 63.40 cm/s MV E/A ratio:  1.10 Riley Lam MD Electronically signed by Riley Lam MD Signature Date/Time: 03/05/2023/3:29:45 PM    Final    MR ANGIO HEAD WO  CONTRAST  Result Date: 03/04/2023 CLINICAL DATA:  Stroke follow-up EXAM: MRA NECK WITHOUT CONTRAST MRA HEAD WITHOUT CONTRAST TECHNIQUE: Angiographic images of the Circle of Willis were acquired using MRA technique without intravenous contrast. COMPARISON:  None Available. FINDINGS: MRA NECK FINDINGS Aortic arch: Normal Right carotid system: Normal Left carotid system: Normal Vertebral arteries: Right dominant.  Patent with antegrade flow Other: None. MRA HEAD FINDINGS POSTERIOR CIRCULATION: --Vertebral arteries: Normal --Inferior cerebellar arteries: Normal. --Basilar artery: Normal. --Superior cerebellar arteries: Normal. --Posterior cerebral arteries: Normal. ANTERIOR CIRCULATION: --Intracranial internal carotid arteries: Normal. --Anterior cerebral arteries (ACA): Normal. --Middle cerebral arteries (MCA): Normal. Other: None. IMPRESSION: Normal MRA of the head and neck. Electronically Signed   By: Deatra Robinson M.D.   On: 03/04/2023 23:01   MR ANGIO NECK WO CONTRAST  Result Date: 03/04/2023 CLINICAL DATA:  Stroke follow-up EXAM: MRA NECK WITHOUT CONTRAST MRA HEAD WITHOUT CONTRAST TECHNIQUE: Angiographic images of the Circle of Willis were acquired using MRA technique without intravenous contrast. COMPARISON:  None Available. FINDINGS: MRA NECK FINDINGS Aortic arch: Normal Right carotid system: Normal Left carotid system: Normal Vertebral arteries: Right dominant.  Patent with antegrade flow Other: None. MRA HEAD FINDINGS POSTERIOR CIRCULATION: --Vertebral arteries: Normal --Inferior cerebellar  arteries: Normal. --Basilar artery: Normal. --Superior cerebellar arteries: Normal. --Posterior cerebral arteries: Normal. ANTERIOR CIRCULATION: --Intracranial internal carotid arteries: Normal. --Anterior cerebral arteries (ACA): Normal. --Middle cerebral arteries (MCA): Normal. Other: None. IMPRESSION: Normal MRA of the head and neck. Electronically Signed   By: Deatra Robinson M.D.   On: 03/04/2023 23:01   MR BRAIN  WO CONTRAST  Result Date: 03/04/2023 CLINICAL DATA:  Syncope/presyncope with cerebrovascular cause suspected EXAM: MRI HEAD WITHOUT CONTRAST TECHNIQUE: Multiplanar, multiecho pulse sequences of the brain and surrounding structures were obtained without intravenous contrast. COMPARISON:  Head CT from yesterday FINDINGS: Brain: 8 mm area of restricted diffusion in the lateral right medulla. Tiny area of more week diffusion hyperintensity along the body of the right lateral ventricle. Although demyelinating process is considered given the patient's young age in the distribution, this is more consistent with small vessel ischemia given patient's history of diabetes, hypertension, and renal failure. No acute hemorrhage, hydrocephalus, masslike finding, or collection. Mild chronic white matter disease in the bilateral cerebral hemisphere. Vascular: Major flow voids are preserved, including the right V4 segment. Skull and upper cervical spine: Normal marrow signal Sinuses/Orbits: Unremarkable Other: Posterior scalp scarring. IMPRESSION: 1. Small acute infarct in the lateral right medulla. 2. Small subacute infarct in the deep right cerebral white matter adjacent to the lateral ventricle. 3. Mild chronic white matter disease. Electronically Signed   By: Tiburcio Pea M.D.   On: 03/04/2023 11:03   CT HEAD WO CONTRAST ( )  Result Date: 03/03/2023 CLINICAL DATA:  Syncope/presyncope, cerebrovascular cause suspected. EXAM: CT HEAD WITHOUT CONTRAST TECHNIQUE: Contiguous axial images were obtained from the base of the skull through the vertex without intravenous contrast. RADIATION DOSE REDUCTION: This exam was performed according to the departmental dose-optimization program which includes automated exposure control, adjustment of the mA and/or kV according to patient size and/or use of iterative reconstruction technique. COMPARISON:  CT head without contrast 12/14/2016 at [facility]. FINDINGS: Brain:  No acute infarct, hemorrhage, or mass lesion is present. No significant white matter lesions are present. Deep brain nuclei are within normal limits. The ventricles are of normal size. No significant extraaxial fluid collection is present. The brainstem and cerebellum are within normal limits. Midline structures are within normal limits. Vascular: No hyperdense vessel or unexpected calcification. Skull: Calvarium is intact. Scalp soft tissue swelling is present posteriorly and near the vertex. No underlying fracture is present. Sinuses/Orbits: The paranasal sinuses and mastoid air cells are clear. The globes and orbits are within normal limits. IMPRESSION: 1. Scalp soft tissue swelling posteriorly and near the vertex without underlying fracture. This appears to represent an inflammatory or infectious process rather than trauma. 2. Normal CT appearance of the brain. Electronically Signed   By: Marin Roberts M.D.   On: 03/03/2023 16:24   DG Abd 1 View  Result Date: 03/01/2023 CLINICAL DATA:  Abdominal distension. EXAM: ABDOMEN - 1 VIEW COMPARISON:  Pelvic CT 04/21/2021 FINDINGS: No bowel dilatation or evidence of obstruction. Small to moderate colonic stool burden. No abnormal rectal distention. No radiopaque calculi or abnormal soft tissue calcifications. Chronic right acetabular excrescence. The included lung bases are clear. IMPRESSION: Normal bowel gas pattern. Small to moderate colonic stool burden. Electronically Signed   By: Narda Rutherford M.D.   On: 03/01/2023 19:03   US RENAL  Result Date: 02/28/2023 CLINICAL DATA:  Acute renal failure EXAM: RENAL / URINARY TRACT ULTRASOUND COMPLETE COMPARISON:  Renal ultrasound 04/22/2021 FINDINGS: Right Kidney: Renal measurements: 12.3 x 6.7 x  5.7 cm = volume: 244 mL. Echogenicity within normal limits. No mass or hydronephrosis visualized. Left Kidney: Renal measurements: 11.0 x 5.8 x 5.4 cm = volume: 181 mL. Echogenicity within normal limits. No mass or  hydronephrosis visualized. Bladder: Appears normal for degree of bladder distention. Other: None. IMPRESSION: Unremarkable renal ultrasound. Electronically Signed   By: Minerva Fester M.D.   On: 02/28/2023 02:43      Subjective: Patient seen and examined at bedside today.  Hemodynamically stable.  Denies any nausea, vomiting this morning.  Denies dizziness.  Discharge Exam: Vitals:   03/08/23 0531 03/08/23 0806  BP: (!) 169/93 137/74  Pulse: 82 84  Resp: 16 16  Temp: 98 F (36.7 C) 98.4 F (36.9 C)  SpO2: 97% 99%   Vitals:   03/07/23 1649 03/07/23 2049 03/08/23 0531 03/08/23 0806  BP: 133/70 (!) 149/81 (!) 169/93 137/74  Pulse: 88 88 82 84  Resp: 20 18 16 16   Temp: 98.3 F (36.8 C) 98.4 F (36.9 C) 98 F (36.7 C) 98.4 F (36.9 C)  TempSrc: Oral Oral  Oral  SpO2: 95% 96% 97% 99%  Weight:      Height:        General: Pt is alert, awake, not in acute distress, morbidly obese Cardiovascular: RRR, S1/S2 +, no rubs, no gallops Respiratory: CTA bilaterally, no wheezing, no rhonchi Abdominal: Soft, NT, ND, bowel sounds + Extremities: no edema, no cyanosis    The results of significant diagnostics from this hospitalization (including imaging, microbiology, ancillary and laboratory) are listed below for reference.     Microbiology: Recent Results (from the past 240 hour(s))  Resp panel by RT-PCR (RSV, Flu A&B, Covid) Anterior Nasal Swab     Status: None   Collection Time: 02/27/23  5:22 PM   Specimen: Anterior Nasal Swab  Result Value Ref Range Status   SARS Coronavirus 2 by RT PCR NEGATIVE NEGATIVE Final    Comment: (NOTE) SARS-CoV-2 target nucleic acids are NOT DETECTED.  The SARS-CoV-2 RNA is generally detectable in upper respiratory specimens during the acute phase of infection. The lowest concentration of SARS-CoV-2 viral copies this assay can detect is 138 copies/mL. A negative result does not preclude SARS-Cov-2 infection and should not be used as the sole  basis for treatment or other patient management decisions. A negative result may occur with  improper specimen collection/handling, submission of specimen other than nasopharyngeal swab, presence of viral mutation(s) within the areas targeted by this assay, and inadequate number of viral copies(<138 copies/mL). A negative result must be combined with clinical observations, patient history, and epidemiological information. The expected result is Negative.  Fact Sheet for Patients:  BloggerCourse.com  Fact Sheet for Healthcare Providers:  SeriousBroker.it  This test is no t yet approved or cleared by the [place] FDA and  has been authorized for detection and/or diagnosis of SARS-CoV-2 by FDA under an Emergency Use Authorization (EUA). This EUA will remain  in effect (meaning this test can be used) for the duration of the COVID-19 declaration under Section 564(b)(1) of the Act, 21 U.S.C.section 360bbb-3(b)(1), unless the authorization is terminated  or revoked sooner.       Influenza A by PCR NEGATIVE NEGATIVE Final   Influenza B by PCR NEGATIVE NEGATIVE Final    Comment: (NOTE) The Xpert Xpress SARS-CoV-2/FLU/RSV plus assay is intended as an aid in the diagnosis of influenza from Nasopharyngeal swab specimens and should not be used as a sole basis for treatment. Nasal washings and aspirates  are unacceptable for Xpert Xpress SARS-CoV-2/FLU/RSV testing.  Fact Sheet for Patients: BloggerCourse.com  Fact Sheet for Healthcare Providers: SeriousBroker.it  This test is not yet approved or cleared by the [place] FDA and has been authorized for detection and/or diagnosis of SARS-CoV-2 by FDA under an Emergency Use Authorization (EUA). This EUA will remain in effect (meaning this test can be used) for the duration of the COVID-19 declaration under Section 564(b)(1) of the Act,  21 U.S.C. section 360bbb-3(b)(1), unless the authorization is terminated or revoked.     Resp Syncytial Virus by PCR NEGATIVE NEGATIVE Final    Comment: (NOTE) Fact Sheet for Patients: BloggerCourse.com  Fact Sheet for Healthcare Providers: SeriousBroker.it  This test is not yet approved or cleared by the [place] FDA and has been authorized for detection and/or diagnosis of SARS-CoV-2 by FDA under an Emergency Use Authorization (EUA). This EUA will remain in effect (meaning this test can be used) for the duration of the COVID-19 declaration under Section 564(b)(1) of the Act, 21 U.S.C. section 360bbb-3(b)(1), unless the authorization is terminated or revoked.  Performed at [facility], [address] [place], [place] [address]      Labs: BNP (last 3 results) No results for input(s): "BNP" in the last 8760 hours. Basic Metabolic Panel: Recent Labs  Lab 03/02/23 0455 03/03/23 0633 03/04/23 0454 03/05/23 1005 03/06/23 0915  NA 136 135 136 138 138  K 3.9 3.7 3.9 4.1 3.8  CL 106 107 107 108 105  CO2 20* 20* 21* 20* 19*  GLUCOSE 218* 222* 203* 225* 195*  BUN 30* 27* 27* 32* 30*  CREATININE 3.17* 3.07* 3.19* 3.05* 3.07*  CALCIUM 8.3* 8.6* 8.3* 8.8* 8.7*   Liver Function Tests: No results for input(s): "AST", "ALT", "ALKPHOS", "BILITOT", "PROT", "ALBUMIN" in the last 168 hours. No results for input(s): "LIPASE", "AMYLASE" in the last 168 hours. No results for input(s): "AMMONIA" in the last 168 hours. CBC: No results for input(s): "WBC", "NEUTROABS", "HGB", "HCT", "MCV", "PLT" in the last 168 hours. Cardiac Enzymes: No results for input(s): "CKTOTAL", "CKMB", "CKMBINDEX", "TROPONINI" in the last 168 hours. BNP: Invalid input(s): "POCBNP" CBG: Recent Labs  Lab 03/07/23 0722 03/07/23 1110 03/07/23 1650 03/07/23 2048 03/08/23 0809  GLUCAP 210* 278* 120* 131* 161*   D-Dimer No results for  input(s): "DDIMER" in the last 72 hours. Hgb A1c No results for input(s): "HGBA1C" in the last 72 hours. Lipid Profile Recent Labs    03/05/23 1005  CHOL 193  HDL 41  LDLCALC 130*  TRIG 110  CHOLHDL 4.7   Thyroid function studies No results for input(s): "TSH", "T4TOTAL", "T3FREE", "THYROIDAB" in the last 72 hours.  Invalid input(s): "FREET3" Anemia work up No results for input(s): "VITAMINB12", "FOLATE", "FERRITIN", "TIBC", "IRON", "RETICCTPCT" in the last 72 hours. Urinalysis    Component Value Date/Time   COLORURINE YELLOW 02/27/2023 1851   APPEARANCEUR CLEAR 02/27/2023 1851   LABSPEC 1.020 02/27/2023 1851   PHURINE 6.5 02/27/2023 1851   GLUCOSEU >=500 (A) 02/27/2023 1851   HGBUR SMALL (A) 02/27/2023 1851   BILIRUBINUR NEGATIVE 02/27/2023 1851   KETONESUR NEGATIVE 02/27/2023 1851   PROTEINUR >=300 (A) 02/27/2023 1851   UROBILINOGEN 0.2 02/14/2008 1634   NITRITE NEGATIVE 02/27/2023 1851   LEUKOCYTESUR NEGATIVE 02/27/2023 1851   Sepsis Labs No results for input(s): "WBC" in the last 168 hours.  Invalid input(s): "PROCALCITONIN", "LACTICIDVEN" Microbiology Recent Results (from the past 240 hour(s))  Resp panel by RT-PCR (RSV, Flu A&B, Covid)  Anterior Nasal Swab     Status: None   Collection Time: 02/27/23  5:22 PM   Specimen: Anterior Nasal Swab  Result Value Ref Range Status   SARS Coronavirus 2 by RT PCR NEGATIVE NEGATIVE Final    Comment: (NOTE) SARS-CoV-2 target nucleic acids are NOT DETECTED.  The SARS-CoV-2 RNA is generally detectable in upper respiratory specimens during the acute phase of infection. The lowest concentration of SARS-CoV-2 viral copies this assay can detect is 138 copies/mL. A negative result does not preclude SARS-Cov-2 infection and should not be used as the sole basis for treatment or other patient management decisions. A negative result may occur with  improper specimen collection/handling, submission of specimen other than  nasopharyngeal swab, presence of viral mutation(s) within the areas targeted by this assay, and inadequate number of viral copies(<138 copies/mL). A negative result must be combined with clinical observations, patient history, and epidemiological information. The expected result is Negative.  Fact Sheet for Patients:  BloggerCourse.com  Fact Sheet for Healthcare Providers:  SeriousBroker.it  This test is no t yet approved or cleared by the [place] FDA and  has been authorized for detection and/or diagnosis of SARS-CoV-2 by FDA under an Emergency Use Authorization (EUA). This EUA will remain  in effect (meaning this test can be used) for the duration of the COVID-19 declaration under Section 564(b)(1) of the Act, 21 U.S.C.section 360bbb-3(b)(1), unless the authorization is terminated  or revoked sooner.       Influenza A by PCR NEGATIVE NEGATIVE Final   Influenza B by PCR NEGATIVE NEGATIVE Final    Comment: (NOTE) The Xpert Xpress SARS-CoV-2/FLU/RSV plus assay is intended as an aid in the diagnosis of influenza from Nasopharyngeal swab specimens and should not be used as a sole basis for treatment. Nasal washings and aspirates are unacceptable for Xpert Xpress SARS-CoV-2/FLU/RSV testing.  Fact Sheet for Patients: BloggerCourse.com  Fact Sheet for Healthcare Providers: SeriousBroker.it  This test is not yet approved or cleared by the [place] FDA and has been authorized for detection and/or diagnosis of SARS-CoV-2 by FDA under an Emergency Use Authorization (EUA). This EUA will remain in effect (meaning this test can be used) for the duration of the COVID-19 declaration under Section 564(b)(1) of the Act, 21 U.S.C. section 360bbb-3(b)(1), unless the authorization is terminated or revoked.     Resp Syncytial Virus by PCR NEGATIVE NEGATIVE Final    Comment:  (NOTE) Fact Sheet for Patients: BloggerCourse.com  Fact Sheet for Healthcare Providers: SeriousBroker.it  This test is not yet approved or cleared by the [place] FDA and has been authorized for detection and/or diagnosis of SARS-CoV-2 by FDA under an Emergency Use Authorization (EUA). This EUA will remain in effect (meaning this test can be used) for the duration of the COVID-19 declaration under Section 564(b)(1) of the Act, 21 U.S.C. section 360bbb-3(b)(1), unless the authorization is terminated or revoked.  Performed at [facility], [address] [place], [place] [address]     Please note: You were cared for by a hospitalist during your hospital stay. Once you are discharged, your primary care physician will handle any further medical issues. Please note that NO REFILLS for any discharge medications will be authorized once you are discharged, as it is imperative that you return to your primary care physician (or establish a relationship with a primary care physician if you do not have one) for your post hospital discharge needs so that they can reassess your need for  medications and monitor your lab values.    Time coordinating discharge: 40 minutes  SIGNED:   Burnadette Pop, MD  [facility] 03/08/2023, 10:03 AM Pager [phone]  If 7PM-7AM, please contact night-coverage [email or web address] Password [email or web address]

## 2023-03-07 NOTE — Progress Notes (Addendum)
Inpatient Rehabilitation Admissions Coordinator   I have insurance approval for CIR 8/28 but await bed availability to admit. No bed available today.  Ottie Glazier, RN, MSN Rehab Admissions Coordinator [phone] 03/07/2023 11:53 AM

## 2023-03-07 NOTE — Progress Notes (Signed)
Physical Therapy Treatment Patient Details Name: Bradley Davis MRN: [id number] DOB: 1986-04-14 Today's Date: 03/07/2023   History of Present Illness 37 y.o. male presents to [facility] hospital on 02/27/2023 with nausea and vomiting for 2 days. Pt was unable to tolerate oral intake and was lightheaded. Pt was hypertensive on evaluation with elevated creatinine. Pt was admitted for AKI and CKD stage III. 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 Pt had episode of symptomatic orthostatic hypotension on 8/29. PMH: DM 1, HTN, CKD III.    PT Comments  Pt received in supine, pt alert and pleasantly cooperative, eager to progress his standing balance and endurance. Pt with good insight into deficits and receptive to instruction on donning/doffing of compression socks, vital sign monitoring and signs/symptoms of vertigo vs orthostatic hypotension. Pt found to be orthostatic with sit>stand transfer despite knee-high BLE compression, RN/MD notified. Pt symptoms fairly mild and he was able to progress gait with RW and no AD/HHA and seated/standing exercises with frequent brief rest breaks to ensure safety based on lightheadedness/mild nausea. Pt continues to benefit from PT services to progress toward functional mobility goals.    *All readings taken after knee-high compression socks donned. 03/07/23 1100  Orthostatic Lying   BP- Lying (!) 162/103  Pulse- Lying 78  Orthostatic Sitting  BP- Sitting (!) 148/97  Pulse- Sitting 82  Orthostatic Standing at 0 minutes  BP- Standing at 0 minutes (!) 119/94  Pulse- Standing at 0 minutes 90     If plan is discharge home, recommend the following: A little help with walking and/or transfers;A lot of help with bathing/dressing/bathroom;Assistance with cooking/housework;Assist for transportation;Help with stairs or ramp for entrance   Can travel by private vehicle        Equipment Recommendations  Rolling walker (2 wheels);BSC/3in1    Recommendations for  Other Services Rehab consult     Precautions / Restrictions Precautions Precautions: Fall Precaution Comments: R medullary CVA with vestibular symptoms, orthostatic hypotension Restrictions Weight Bearing Restrictions: No     Mobility  Bed Mobility Overal bed mobility: Needs Assistance Bed Mobility: Supine to Sit     Supine to sit: Modified independent (Device/Increase time)     General bed mobility comments: use of bed features/rail    Transfers Overall transfer level: Needs assistance Equipment used: Rolling walker (2 wheels) Transfers: Sit to/from Stand Sit to Stand: Contact guard assist           General transfer comment: from EOB x10 in a row with arms crossed at chest, pt using BLE supported posterior by bed frame initially, able to adjust to stand without support after cues. Mild c/o "on a boat" sensation with initial stand (found to be orthostatic) but able to perform reciprocal transfers after seated break.    Ambulation/Gait Ambulation/Gait assistance: Contact guard assist, Min assist Gait Distance (Feet): 80 Feet Assistive device: Rolling walker (2 wheels); None Gait Pattern/deviations: Step-through pattern Gait velocity: reduced     General Gait Details: Pt with 3 instances of rightward loss of balance, requiring stepping strategy and UE support to correct. PTA provides cues for segmental turning in an effort to compensate for vestibular dysfunction. ~77ft with HHA/no AD and intermittent minA and ~24ft with RW and CGA.   Stairs             Wheelchair Mobility     Tilt Bed    Modified Rankin (Stroke Patients Only) Modified Rankin (Stroke Patients Only) Pre-Morbid Rankin Score: No symptoms Modified Rankin: Moderately  severe disability     Balance Overall balance assessment: Needs assistance Sitting-balance support: No upper extremity supported, Feet supported Sitting balance-Leahy Scale: Good     Standing balance support: No upper  extremity supported, During functional activity Standing balance-Leahy Scale: Fair Standing balance comment: stands and may even ambulate without UE support but has LOBs                            Cognition Arousal: Alert Behavior During Therapy: WFL for tasks assessed/performed Overall Cognitive Status: Within Functional Limits for tasks assessed                                 General Comments: Pt A&O, motivated to progress balance/endurance so he can return to independence. Good attention to task.        Exercises Other Exercises Other Exercises: VOR x 1 horizontal Other Exercises: standing hip flexion x10 reps ea Other Exercises: STS x 10 reps with arms crossed at chest Other Exercises: seated BUE AROM: chest press (rowing/alternating press) x15 reps prior to standing to see if this improves hemodynamic symptoms when standing Other Exercises: Single leg stance each LE with HHA/RW support x2 reps ea side, pt tolerates a few seconds up to 10 seconds    General Comments General comments (skin integrity, edema, etc.): see orthostatic BP in comments above      Pertinent Vitals/Pain Pain Assessment Pain Assessment: Faces Faces Pain Scale: Hurts a little bit Pain Location: R foot "tingling" sensation Pain Descriptors / Indicators: Tingling Pain Intervention(s): Monitored during session, Repositioned     PT Goals (current goals can now be found in the care plan section) Acute Rehab PT Goals Patient Stated Goal: To return home and back to work. PT Goal Formulation: With patient Time For Goal Achievement: 03/17/23 Progress towards PT goals: Progressing toward goals    Frequency    Min 1X/week      PT Plan         AM-PAC PT "6 Clicks" Mobility   Outcome Measure  Help needed turning from your back to your side while in a flat bed without using bedrails?: A Little Help needed moving from lying on your back to sitting on the side of a flat bed  without using bedrails?: A Little (flat bed/no rails) Help needed moving to and from a bed to a chair (including a wheelchair)?: A Little Help needed standing up from a chair using your arms (e.g., wheelchair or bedside chair)?: A Little Help needed to walk in hospital room?: A Little Help needed climbing 3-5 steps with a railing? : A Lot 6 Click Score: 17    End of Session Equipment Utilized During Treatment: Gait belt Activity Tolerance: Patient tolerated treatment well;Other (comment);Treatment limited secondary to medical complications (Comment) (c/o vertigo symptoms, also orthostatic hypotension with sx but able to perform many activities with knee-high compression socks) Patient left: in chair;with call bell/phone within reach;Other (comment) (pt agreeable to use call bell for OOB/chair mobility) Nurse Communication: Mobility status;Other (comment) (symptomatic orthostatic hypotension) PT Visit Diagnosis: Unsteadiness on feet (R26.81);Other abnormalities of gait and mobility (R26.89)     Time: 8119-1478 PT Time Calculation (min) (ACUTE ONLY): 41 min  Charges:    $Gait Training: 8-22 mins $Therapeutic Exercise: 8-22 mins $Therapeutic Activity: 8-22 mins PT General Charges $$ ACUTE PT VISIT: 1 Visit  Florina Ou., PTA Acute Rehabilitation Services Secure Chat Preferred 9a-5:30pm Office: [phone]    Dorathy Kinsman [facility] 03/07/2023, 1:18 PM

## 2023-03-07 NOTE — Progress Notes (Addendum)
STROKE TEAM PROGRESS NOTE   BRIEF HPI Bradley Davis is a 37 y.o. male with PMH significant for HTN, CKD 3a, DM1 who presented to ED 8/21 c/o nausea and vomiting x 1 day, intermittent positional dizziness and R sided headache.     SIGNIFICANT HOSPITAL EVENTS 8/21 presented to ED to hospital  INTERIM HISTORY/SUBJECTIVE Reports improvement and nausea. Qualified today for the sleep smart study and was randomized to receive CPAP. Still pending CIR insurance approval.  Continues to have mild paresthesia on his face but otherwise neurological exam is improved.  Vital signs stable.  OBJECTIVE  CBC    Component Value Date/Time   WBC 8.1 02/28/2023 0412   RBC 4.69 02/28/2023 0412   HGB 12.5 (L) 02/28/2023 0412   HCT 37.1 (L) 02/28/2023 0412   PLT 346 02/28/2023 0412   MCV 79.1 (L) 02/28/2023 0412   MCV 87.1 01/19/2012 1310   MCH 26.7 02/28/2023 0412   MCHC 33.7 02/28/2023 0412   RDW 13.2 02/28/2023 0412   LYMPHSABS 3.2 02/28/2023 0412   MONOABS 0.6 02/28/2023 0412   EOSABS 0.3 02/28/2023 0412   BASOSABS 0.0 02/28/2023 0412    BMET    Component Value Date/Time   NA 138 03/06/2023 0915   K 3.8 03/06/2023 0915   CL 105 03/06/2023 0915   CO2 19 (L) 03/06/2023 0915   GLUCOSE 195 (H) 03/06/2023 0915   BUN 30 (H) 03/06/2023 0915   CREATININE 3.07 (H) 03/06/2023 0915   CALCIUM 8.7 (L) 03/06/2023 0915   GFRNONAA 26 (L) 03/06/2023 0915    IMAGING past 24 hours No results found.  Vitals:   03/06/23 1638 03/06/23 2104 03/07/23 0528 03/07/23 0851  BP: (!) 159/100 (!) 156/86 135/82 (!) 151/90  Pulse: 84 89 82 88  Resp:  18 18 20   Temp:  98.6 F (37 C) 98.3 F (36.8 C) 98.1 F (36.7 C)  TempSrc:  Oral Oral Oral  SpO2:  95% 94% 95%  Weight:      Height:         PHYSICAL EXAM General:  Alert, well-nourished, well-developed patient in no acute distress Psych:  Mood and affect appropriate for situation CV: Regular rate and rhythm on monitor Respiratory:  Regular, unlabored  respirations on room air GI: Abdomen soft and nontender   NEURO:  Mental Status: AA&Ox3, patient is able to give clear and coherent history Speech/Language: speech is without dysarthria or aphasia.  Naming, repetition, fluency, and comprehension intact.  Cranial Nerves:  II: PERRL. Visual fields full.  III, IV, VI: EOMI. Eyelids elevate symmetrically.  V: R facial numbness compared to L.   VII: L lower facial weakness compared to R.  VIII: hearing intact to voice. IX, X: Palate elevates symmetrically. Phonation is normal.  ZO:[id number] shrug 5/5. XII: tongue is midline without fasciculations. Motor: 5/5 strength to all muscle groups tested.  Tone: is normal and bulk is normal Sensation- Intact to light touch bilaterally, except R facial numbness. Extinction absent to light touch to DSS.   Coordination: FTN intact bilaterally, HKS: no ataxia in BLE.No drift.  Gait- deferred   .   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    MRI  small acute infract in the lateral R medulla. Small subacute infarct in the deep R cerebral white matter.  MRA (  head/neck)  normal 2D Echo EF 55-60%. Negative bubble. No intracardiac source of emboli detected. Normal L atrium.  LDL 130 HgbA1c 13.8 VTE prophylaxis - SCDs No antithrombotic prior to admission, now on aspirin 81 mg daily and clopidogrel 75 mg daily for 3 weeks and then aspirin alone. Therapy recommendations:  CIR, pending insurance Disposition:  pending  Hypertension Home meds:  amlodipine 10 mg once daily, hydrochlorothiazide 25 mg once daily Stable but hypertensive BP goal: normotensive  Hyperlipidemia Home meds:  atorvastatin 80 mg once daily, resumed in hospital LDL 130, goal < 70 On maximum statin and not  at goal, sent prior authorization for Leqvio infusion  Continue statin at discharge  Diabetes type II Uncontrolled Home meds:  insulin glargine 40 units once at bedtime, insulin lispro 1-30 units per sliding scale three times daily with meals HgbA1c 13.8, goal < 7.0 Followed by endocrinology and PCP.  Unable to afford CGM for the last year.  Diabetes RN consulted.  Recommend close follow-up with PCP for better DM control  Dysphagia Patient has post-stroke dysphagia, SLP consulted    Diet   Diet Carb Modified Fluid consistency: Thin; Room service appropriate? Yes   Advance diet as tolerated  Other Stroke Risk Factors ETOH use, advised to drink no more than 2 drinks a day Obesity, Body mass index is 36.12 kg/m., BMI >/= 30 associated with increased stroke risk, recommend weight loss, diet and exercise as appropriate  High risk for Obstructive sleep apnea but no formal past assessment, discussed Stroke x OSA clinical trial with patient.   Other Active Problems AKI on CKD, Cr ~3 (baseline ~2), primary team following Nausea/vomiting, on reglan  Hospital day # 8  Meryl Dare, MD PGY-1 Psychiatry Resident 03/07/2023, 11:27 AM I have personally obtained history,examined this patient, reviewed notes, independently viewed imaging studies, participated in medical decision making and plan of care.ROS completed by me personally and pertinent positives fully documented  I have made any additions or clarifications directly to the above note. Agree with note above.  Patient continues to show improvement though has mild residual paresthesias on his face.  He was able to tolerate CPAP mask overnight and has been randomized to CPAP treatment of the sleep smart stroke prevention study.  Is medically stable to transfer to inpatient rehab pending approval of insurance and ability of the bed.  Discussed with patient and Dr. Renford Dills.  Follow-up as a outpatient in stroke research clinic as per sleep smart  study scheduled.  Stroke team will sign off.  Kindly call for questions.  Delia Heady, MD Medical Director [facility] Pager: [phone] 03/07/2023 2:48 PM  To contact Stroke Continuity provider, please refer to [email or web address]. After hours, contact General Neurology

## 2023-03-08 ENCOUNTER — Encounter (HOSPITAL_COMMUNITY): Payer: Self-pay | Admitting: Physical Medicine & Rehabilitation

## 2023-03-08 ENCOUNTER — Other Ambulatory Visit: Payer: Self-pay

## 2023-03-08 ENCOUNTER — Encounter (HOSPITAL_COMMUNITY): Payer: Self-pay | Admitting: Family Medicine

## 2023-03-08 ENCOUNTER — Inpatient Hospital Stay (HOSPITAL_COMMUNITY)
Admission: RE | Admit: 2023-03-08 | Discharge: 2023-03-18 | DRG: 057 | Disposition: A | Discharge status: Home / Self Care | Payer: Medicaid Other | Source: Intra-hospital | Attending: Physical Medicine & Rehabilitation | Admitting: Physical Medicine & Rehabilitation

## 2023-03-08 DIAGNOSIS — I739 Peripheral vascular disease, unspecified: Secondary | ICD-10-CM | POA: Diagnosis present

## 2023-03-08 DIAGNOSIS — E1151 Type 2 diabetes mellitus with diabetic peripheral angiopathy without gangrene: Secondary | ICD-10-CM | POA: Diagnosis present

## 2023-03-08 DIAGNOSIS — G4733 Obstructive sleep apnea (adult) (pediatric): Secondary | ICD-10-CM | POA: Diagnosis present

## 2023-03-08 DIAGNOSIS — I10 Essential (primary) hypertension: Secondary | ICD-10-CM | POA: Diagnosis not present

## 2023-03-08 DIAGNOSIS — E1065 Type 1 diabetes mellitus with hyperglycemia: Secondary | ICD-10-CM | POA: Diagnosis present

## 2023-03-08 DIAGNOSIS — I129 Hypertensive chronic kidney disease with stage 1 through stage 4 chronic kidney disease, or unspecified chronic kidney disease: Secondary | ICD-10-CM | POA: Diagnosis present

## 2023-03-08 DIAGNOSIS — E1029 Type 1 diabetes mellitus with other diabetic kidney complication: Secondary | ICD-10-CM | POA: Diagnosis present

## 2023-03-08 DIAGNOSIS — I639 Cerebral infarction, unspecified: Secondary | ICD-10-CM | POA: Diagnosis not present

## 2023-03-08 DIAGNOSIS — D631 Anemia in chronic kidney disease: Secondary | ICD-10-CM | POA: Diagnosis present

## 2023-03-08 DIAGNOSIS — K3184 Gastroparesis: Secondary | ICD-10-CM | POA: Diagnosis present

## 2023-03-08 DIAGNOSIS — Z794 Long term (current) use of insulin: Secondary | ICD-10-CM

## 2023-03-08 DIAGNOSIS — Z79899 Other long term (current) drug therapy: Secondary | ICD-10-CM | POA: Diagnosis not present

## 2023-03-08 DIAGNOSIS — E785 Hyperlipidemia, unspecified: Secondary | ICD-10-CM | POA: Diagnosis present

## 2023-03-08 DIAGNOSIS — R066 Hiccough: Secondary | ICD-10-CM | POA: Diagnosis present

## 2023-03-08 DIAGNOSIS — E872 Acidosis, unspecified: Secondary | ICD-10-CM | POA: Diagnosis present

## 2023-03-08 DIAGNOSIS — R519 Headache, unspecified: Secondary | ICD-10-CM | POA: Diagnosis present

## 2023-03-08 DIAGNOSIS — K5900 Constipation, unspecified: Secondary | ICD-10-CM | POA: Insufficient documentation

## 2023-03-08 DIAGNOSIS — Z8249 Family history of ischemic heart disease and other diseases of the circulatory system: Secondary | ICD-10-CM | POA: Diagnosis not present

## 2023-03-08 DIAGNOSIS — I69393 Ataxia following cerebral infarction: Secondary | ICD-10-CM | POA: Diagnosis present

## 2023-03-08 DIAGNOSIS — E1022 Type 1 diabetes mellitus with diabetic chronic kidney disease: Secondary | ICD-10-CM | POA: Diagnosis present

## 2023-03-08 DIAGNOSIS — E1043 Type 1 diabetes mellitus with diabetic autonomic (poly)neuropathy: Secondary | ICD-10-CM | POA: Diagnosis present

## 2023-03-08 DIAGNOSIS — N179 Acute kidney failure, unspecified: Secondary | ICD-10-CM | POA: Diagnosis present

## 2023-03-08 DIAGNOSIS — K5901 Slow transit constipation: Secondary | ICD-10-CM | POA: Diagnosis not present

## 2023-03-08 DIAGNOSIS — Z886 Allergy status to analgesic agent status: Secondary | ICD-10-CM | POA: Diagnosis not present

## 2023-03-08 DIAGNOSIS — M726 Necrotizing fasciitis: Secondary | ICD-10-CM | POA: Diagnosis present

## 2023-03-08 DIAGNOSIS — N1831 Chronic kidney disease, stage 3a: Secondary | ICD-10-CM | POA: Diagnosis present

## 2023-03-08 LAB — GLUCOSE, CAPILLARY
Glucose-Capillary: 161 mg/dL — ABNORMAL HIGH (ref 70–99)
Glucose-Capillary: 132 mg/dL — ABNORMAL HIGH (ref 70–99)
Glucose-Capillary: 127 mg/dL — ABNORMAL HIGH (ref 70–99)
Glucose-Capillary: 160 mg/dL — ABNORMAL HIGH (ref 70–99)

## 2023-03-08 MED ORDER — ASPIRIN 81 MG PO TBEC
81.0000 mg | DELAYED_RELEASE_TABLET | Freq: Every day | ORAL | Status: DC
  Administered 2023-03-09 – 2023-03-18 (×9): 81 mg via ORAL
  Filled 2023-03-08 (×9): qty 1

## 2023-03-08 MED ORDER — INSULIN ASPART 100 UNIT/ML IJ SOLN
0.0000 [IU] | Freq: Every day | INTRAMUSCULAR | Status: DC
  Administered 2023-03-14: 3 [IU] via SUBCUTANEOUS
  Administered 2023-03-16: 2 [IU] via SUBCUTANEOUS

## 2023-03-08 MED ORDER — DIPHENHYDRAMINE HCL 25 MG PO CAPS: 25.0000 mg | ORAL_CAPSULE | Freq: Four times a day (QID) | ORAL | Status: DC | PRN

## 2023-03-08 MED ORDER — MELATONIN 5 MG PO TABS: 5.0000 mg | ORAL_TABLET | Freq: Every evening | ORAL | Status: DC | PRN

## 2023-03-08 MED ORDER — BISACODYL 10 MG RE SUPP: 10.0000 mg | Freq: Every day | RECTAL | Status: DC | PRN

## 2023-03-08 MED ORDER — HYDRALAZINE HCL 50 MG PO TABS
100.0000 mg | ORAL_TABLET | Freq: Three times a day (TID) | ORAL | Status: DC
  Administered 2023-03-08 – 2023-03-18 (×28): 100 mg via ORAL
  Filled 2023-03-08 (×28): qty 2

## 2023-03-08 MED ORDER — INSULIN ASPART 100 UNIT/ML IJ SOLN
0.0000 [IU] | Freq: Three times a day (TID) | INTRAMUSCULAR | Status: DC
  Administered 2023-03-08 – 2023-03-10 (×4): 1 [IU] via SUBCUTANEOUS
  Administered 2023-03-11: 3 [IU] via SUBCUTANEOUS
  Administered 2023-03-11 – 2023-03-13 (×3): 2 [IU] via SUBCUTANEOUS
  Administered 2023-03-13: 1 [IU] via SUBCUTANEOUS
  Administered 2023-03-13 – 2023-03-14 (×4): 2 [IU] via SUBCUTANEOUS
  Administered 2023-03-15: 5 [IU] via SUBCUTANEOUS
  Administered 2023-03-15: 2 [IU] via SUBCUTANEOUS
  Administered 2023-03-15: 1 [IU] via SUBCUTANEOUS
  Administered 2023-03-16: 3 [IU] via SUBCUTANEOUS
  Administered 2023-03-16 (×2): 2 [IU] via SUBCUTANEOUS
  Administered 2023-03-17: 7 [IU] via SUBCUTANEOUS
  Administered 2023-03-17: 3 [IU] via SUBCUTANEOUS
  Administered 2023-03-17: 7 [IU] via SUBCUTANEOUS

## 2023-03-08 MED ORDER — PROCHLORPERAZINE 25 MG RE SUPP: 12.5000 mg | Freq: Four times a day (QID) | RECTAL | Status: DC | PRN

## 2023-03-08 MED ORDER — PROCHLORPERAZINE EDISYLATE 10 MG/2ML IJ SOLN
5.0000 mg | Freq: Four times a day (QID) | INTRAMUSCULAR | Status: DC | PRN
  Administered 2023-03-08 – 2023-03-12 (×3): 10 mg via INTRAVENOUS
  Filled 2023-03-08 (×4): qty 2

## 2023-03-08 MED ORDER — INSULIN DETEMIR 100 UNIT/ML ~~LOC~~ SOLN
20.0000 [IU] | Freq: Two times a day (BID) | SUBCUTANEOUS | Status: DC
  Administered 2023-03-08 – 2023-03-11 (×6): 20 [IU] via SUBCUTANEOUS
  Filled 2023-03-08 (×8): qty 0.2

## 2023-03-08 MED ORDER — AMLODIPINE BESYLATE 10 MG PO TABS
10.0000 mg | ORAL_TABLET | Freq: Every day | ORAL | Status: DC
  Administered 2023-03-09 – 2023-03-18 (×9): 10 mg via ORAL
  Filled 2023-03-08 (×9): qty 1

## 2023-03-08 MED ORDER — ENOXAPARIN SODIUM 40 MG/0.4ML IJ SOSY
40.0000 mg | PREFILLED_SYRINGE | INTRAMUSCULAR | Status: DC
  Administered 2023-03-08 – 2023-03-17 (×10): 40 mg via SUBCUTANEOUS
  Filled 2023-03-08 (×10): qty 0.4

## 2023-03-08 MED ORDER — ACETAMINOPHEN 325 MG PO TABS
325.0000 mg | ORAL_TABLET | ORAL | Status: DC | PRN
  Administered 2023-03-08 – 2023-03-16 (×8): 650 mg via ORAL
  Filled 2023-03-08 (×8): qty 2

## 2023-03-08 MED ORDER — MECLIZINE HCL 25 MG PO TABS: 25.0000 mg | ORAL_TABLET | Freq: Three times a day (TID) | ORAL | Status: DC | PRN

## 2023-03-08 MED ORDER — FLEET ENEMA RE ENEM: 1.0000 | ENEMA | Freq: Once | RECTAL | Status: DC | PRN

## 2023-03-08 MED ORDER — GUAIFENESIN-DM 100-10 MG/5ML PO SYRP: 5.0000 mL | ORAL_SOLUTION | Freq: Four times a day (QID) | ORAL | Status: DC | PRN

## 2023-03-08 MED ORDER — SODIUM BICARBONATE 650 MG PO TABS
650.0000 mg | ORAL_TABLET | Freq: Three times a day (TID) | ORAL | Status: DC
  Administered 2023-03-08 – 2023-03-18 (×29): 650 mg via ORAL
  Filled 2023-03-08 (×29): qty 1

## 2023-03-08 MED ORDER — ALUM & MAG HYDROXIDE-SIMETH 200-200-20 MG/5ML PO SUSP: 30.0000 mL | ORAL | Status: DC | PRN

## 2023-03-08 MED ORDER — POLYETHYLENE GLYCOL 3350 17 G PO PACK
17.0000 g | PACK | Freq: Every day | ORAL | Status: DC
  Administered 2023-03-11 – 2023-03-18 (×7): 17 g via ORAL
  Filled 2023-03-08 (×8): qty 1

## 2023-03-08 MED ORDER — SENNA 8.6 MG PO TABS
1.0000 | ORAL_TABLET | Freq: Two times a day (BID) | ORAL | Status: DC
  Administered 2023-03-08 – 2023-03-18 (×19): 8.6 mg via ORAL
  Filled 2023-03-08 (×19): qty 1

## 2023-03-08 MED ORDER — ATORVASTATIN CALCIUM 80 MG PO TABS
80.0000 mg | ORAL_TABLET | Freq: Every day | ORAL | Status: DC
  Administered 2023-03-09 – 2023-03-17 (×9): 80 mg via ORAL
  Filled 2023-03-08 (×9): qty 1

## 2023-03-08 MED ORDER — LABETALOL HCL 300 MG PO TABS
300.0000 mg | ORAL_TABLET | Freq: Three times a day (TID) | ORAL | Status: DC
  Administered 2023-03-08 – 2023-03-18 (×29): 300 mg via ORAL
  Filled 2023-03-08 (×30): qty 1

## 2023-03-08 MED ORDER — CLOPIDOGREL BISULFATE 75 MG PO TABS
75.0000 mg | ORAL_TABLET | Freq: Every day | ORAL | Status: DC
  Administered 2023-03-09 – 2023-03-18 (×9): 75 mg via ORAL
  Filled 2023-03-08 (×9): qty 1

## 2023-03-08 MED ORDER — PROCHLORPERAZINE MALEATE 5 MG PO TABS
5.0000 mg | ORAL_TABLET | Freq: Four times a day (QID) | ORAL | Status: DC | PRN
  Administered 2023-03-12 – 2023-03-17 (×2): 10 mg via ORAL
  Filled 2023-03-08 (×2): qty 2

## 2023-03-08 MED ORDER — INSULIN ASPART 100 UNIT/ML IJ SOLN
2.0000 [IU] | Freq: Three times a day (TID) | INTRAMUSCULAR | Status: DC
  Administered 2023-03-08 – 2023-03-13 (×11): 2 [IU] via SUBCUTANEOUS

## 2023-03-08 NOTE — Progress Notes (Signed)
Inpatient Rehabilitation Admission Medication Review by a Pharmacist  A complete drug regimen review was completed for this patient to identify any potential clinically significant medication issues.  High Risk Drug Classes Is patient taking? Indication by Medication  Antipsychotic Yes Compazine PRN- nausea   Anticoagulant Yes Lovenox- DVT prophylaxis   Antibiotic No   Opioid No   Antiplatelet Yes Plavix/Aspirin- planned for 3 weeks, then ASA alone  (stop 9/15) - CVA   Hypoglycemics/insulin Yes Levemir BID, aspart TID WC- Diabetes   Vasoactive Medication Yes Labetolol, amlodipine, hydralazine- HTN   Chemotherapy No   Other Yes Benadryl- itching  Melatonin PRN- sleep  Meclizine PRN- dizziness  Lipitor- HLD  Sodium bicarbonate- metabolic acidosis/CKD      Type of Medication Issue Identified Description of Issue Recommendation(s)  Drug Interaction(s) (clinically significant)     Duplicate Therapy     Allergy     No Medication Administration End Date     Incorrect Dose     Additional Drug Therapy Needed     Significant med changes from prior encounter (inform family/care partners about these prior to discharge). Hydrochlorothiazide discontinued, benazepril discontinued     Other       Clinically significant medication issues were identified that warrant physician communication and completion of prescribed/recommended actions by midnight of the next day:  No  Name of provider notified for urgent issues identified:   Provider Method of Notification:     Pharmacist comments:   Time spent performing this drug regimen review (minutes):  20 minutes    Jani Gravel, PharmD Clinical Pharmacist  03/08/2023 1:25 PM

## 2023-03-08 NOTE — Progress Notes (Signed)
INPATIENT REHABILITATION ADMISSION NOTE   Arrival Method: wheelchair     Mental Orientation: oriented x4    Assessment: completed, see flowsheet   Skin: intact   IV'S: right FA SL   Pain: none    Tubes and Drains: none   Safety Measures: bed alarm on, call light in reach. Oriented to rehab and safety precautions. Pt in agreement   Vital Signs: completed, see flowsheet   Height and Weight: documents, see flowsheet   Rehab Orientation: reviewed and pt in agreement    Family: not present. Notified of arrival per pt.    Notes:Patient ID: Bradley Davis, male   DOB: 06-05-1986, 37 y.o.   MRN: [id number]

## 2023-03-08 NOTE — H&P (Shared)
Physical Medicine and Rehabilitation Admission H&P    Chief Complaint  Patient presents with   Functional deficits due to stroke    HPI:  Bradley Davis is a 37 year old left handed male with history of T1DM, CKD 3a, HTN who was admitted on 02/27/23 with 2 day history of N/V with inability to keep down food or meds progressing to dizziness with standing attempts. He was found to have acute on chronic renal failure with SCR up to 3.6 and treated with  2 L fluid bolus and started on IVF for hydration. KUB done showed small to moderate stool burden and he was started on bowel regimen with good results. Therapy initiated and he was noted to have loss of balance to the right with reports of decreased sensation on RLE, unstable and staggering gait as well as orthostatic changes.  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   Dr. Derry Lory consulted for input and MRA  brain ordered which was WNL. 2 D echo with bubble study revealed EF 55-60% with no evidence of shunt. Renal ultrasound showed normal kidneys.  Dr. Pearlean Brownie felt that stroke was due to small vessel disease. Patient also felt to be high risk for sleep apnea and qualified for Sleep Smart study based on overnight testing. Right facial numbness improved, denies dizziness or vision changes and continues to have persistent HA.     Review of Systems  HENT:  Negative for hearing loss and tinnitus.   Eyes:  Negative for blurred vision and double vision.  Respiratory:  Negative for cough and shortness of breath.   Cardiovascular:  Negative for chest pain and palpitations.  Gastrointestinal:  Negative for constipation, heartburn, nausea and vomiting.  Genitourinary:  Negative for dysuria and urgency.       Gets up 3-4 times at night to urinate  Musculoskeletal:  Negative for back pain, joint pain, myalgias and neck pain.   Neurological:  Positive for dizziness (with activity and last 30 minutes), weakness and headaches (persistent).  Psychiatric/Behavioral:  The patient does not have insomnia.      Past Medical History:  Diagnosis Date   CKD stage 3a, GFR 45-59 ml/min (HCC)    Diabetes mellitus    Hypertension    Necrotizing fasciitis (HCC) 04/2021   right groin    Past Surgical History:  Procedure Laterality Date   INCISION AND DRAINAGE ABSCESS Right 04/21/2021   Procedure: INCISION, DRAINAGE, DEBRIDEMENT OF RIGHT THIGH/GROIN INFECTION;  Surgeon: Abigail Miyamoto, MD;  Location: [facility];  Service: General;  Laterality: Right;   IRRIGATION AND DEBRIDEMENT ABSCESS Right 04/23/2021   Procedure: IRRIGATION AND DEBRIDEMENT RIGHT GROIN WOUND; RIGHT THIGH DRESSING CHANGE;  Surgeon: Abigail Miyamoto, MD;  Location: [facility];  Service: General;  Laterality: Right;   IRRIGATION AND DEBRIDEMENT ABSCESS Right 04/26/2021   Procedure: reexploration and debridement of soft tissue infection right groin;  Surgeon: Emelia Loron, MD;  Location: [facility];  Service: General;  Laterality: Right;   RETINAL DETACHMENT SURGERY  2022   TONSILLECTOMY  2003    Family History  Problem Relation Age of Onset   Cancer Mother    Congestive Heart Failure Father    Cancer Sister     Social History: Lives alone. Self employed chef--owns a food truck.  He reports that he has never smoked. He has never used smokeless tobacco. He reports that he drinks occasionally.  He reports that he does not use drugs.   Allergies  Allergen Reactions   Ibuprofen Swelling    Pt reports taking after reported allergy w/ no reactions    Medications Prior to Admission  Medication Sig Dispense Refill   acetaminophen (TYLENOL) 325 MG tablet Take 650 mg by mouth as needed for mild pain or headache.     amLODipine (NORVASC) 10 MG tablet Take 1 tablet (10 mg total) by mouth daily. 30 tablet 0   atorvastatin (LIPITOR) 80 MG tablet Take 1 tablet  (80 mg total) by mouth daily.     benazepril (LOTENSIN) 20 MG tablet Take 20 mg by mouth daily.     HUMALOG KWIKPEN 100 UNIT/ML KwikPen Inject 1-30 Units into the skin in the morning, at noon, in the evening, and at bedtime. Per sliding scale     hydrochlorothiazide (HYDRODIURIL) 25 MG tablet Take 50 mg by mouth daily.     Insulin Glargine (BASAGLAR KWIKPEN) 100 UNIT/ML Inject 40 Units into the skin at bedtime.     sildenafil (VIAGRA) 50 MG tablet Take 50 mg by mouth as needed for erectile dysfunction.     sodium chloride (OCEAN) 0.65 % nasal spray Place 2 sprays into the nose daily as needed for congestion.     Vitamin D, Ergocalciferol, (DRISDOL) 1.25 MG (50000 UNIT) CAPS capsule Take 50,000 Units by mouth once a week. Monday     [DISCONTINUED] hydrALAZINE (APRESOLINE) 100 MG tablet Take 1 tablet (100 mg total) by mouth every 8 (eight) hours. 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  Functional Status:  Mobility: Bed Mobility Overal bed mobility: Needs Assistance Bed Mobility: Supine to [employer]: Supervision Sidelying to sit: Supervision Supine to sit: Modified independent (Device/Increase time) Sit to supine: Supervision General bed mobility comments: use of bed features/rail Transfers Overall transfer level: Needs assistance Equipment used:  Rolling walker (2 wheels) Transfers: Sit to/from Stand Sit to Stand: Contact guard assist General transfer comment: from EOB x10 in a row with arms crossed at chest, pt using BLE supported posterior by bed frame initially, able to adjust to stand without support after cues. Mild c/o "on a boat" sensation with initial stand (found to be orthostatic) but able to perform reciprocal transfers after seated break. Ambulation/Gait Ambulation/Gait assistance: Contact guard assist, Min assist Gait Distance (Feet): 80 Feet Assistive device: Rolling walker (2 wheels) Gait Pattern/deviations: Step-through pattern General Gait Details: Pt with 3 instances of rightward loss of balance, requiring stepping strategy and UE support to correct. PTA provides cues for segmental turning in an effort to compensate for vestibular dysfunction. Gait velocity: reduced Gait velocity interpretation: <1.8 ft/sec, indicate of risk for recurrent falls    ADL: ADL Overall ADL's : Needs assistance/impaired Eating/Feeding: Independent, Sitting Grooming: Contact guard assist, Wash/dry face, Standing Grooming Details (indicate cue type and reason): standing at sink Upper Body Bathing: Set up, Sitting Lower Body Bathing: Moderate assistance, Sit to/from  stand Upper Body Dressing : Set up, Sitting Lower Body Dressing: Moderate assistance, Sit to/from stand Toilet Transfer: Moderate assistance, Rolling walker (2 wheels) Toileting- Clothing Manipulation and Hygiene: Supervision/safety, Sitting/lateral lean Functional mobility during ADLs: Contact guard assist, Rolling walker (2 wheels) General ADL Comments: Pt declined need for dressing ADLs at this time, focused session on progressing with balance. Pt ambulating in hall carrying backpack (to simulate what he carries for work) on his L and R shoulder with minor LOBs noted throoughout ambulation needing min A to recover. Then proceeded to work on picking up light weight objects  from floor level to which he needed min A to help maintain balance  Cognition: Cognition Overall Cognitive Status: Within Functional Limits for tasks assessed Cognition Arousal: Alert Behavior During Therapy: [facility] for tasks assessed/performed Overall Cognitive Status: Within Functional Limits for tasks assessed General Comments: Pt A&O, motivated to progress balance/endurance so he can return to independence. Good attention to task.   Blood pressure 137/74, pulse 84, temperature 98.4 F (36.9 C), temperature source Oral, resp. rate 16, height 5\' 10"  (1.778 m), weight 114.2 kg, SpO2 99%. Physical Exam  Results for orders placed or performed during the hospital encounter of 02/27/23 (from the past 48 hour(s))  Glucose, capillary     Status: Abnormal   Collection Time: 03/06/23  4:36 PM  Result Value Ref Range   Glucose-Capillary 179 (H) 70 - 99 mg/dL    Comment: Glucose reference range applies only to samples taken after fasting for at least 8 hours.  Glucose, capillary     Status: Abnormal   Collection Time: 03/06/23  9:04 PM  Result Value Ref Range   Glucose-Capillary 244 (H) 70 - 99 mg/dL    Comment: Glucose reference range applies only to samples taken after fasting for at least 8 hours.  Glucose, capillary     Status: Abnormal   Collection Time: 03/07/23  7:22 AM  Result Value Ref Range   Glucose-Capillary 210 (H) 70 - 99 mg/dL    Comment: Glucose reference range applies only to samples taken after fasting for at least 8 hours.  Glucose, capillary     Status: Abnormal   Collection Time: 03/07/23 11:10 AM  Result Value Ref Range   Glucose-Capillary 278 (H) 70 - 99 mg/dL    Comment: Glucose reference range applies only to samples taken after fasting for at least 8 hours.  Glucose, capillary     Status: Abnormal   Collection Time: 03/07/23  4:50 PM  Result Value Ref Range   Glucose-Capillary 120 (H) 70 - 99 mg/dL    Comment: Glucose reference range applies only to samples  taken after fasting for at least 8 hours.  Glucose, capillary     Status: Abnormal   Collection Time: 03/07/23  8:48 PM  Result Value Ref Range   Glucose-Capillary 131 (H) 70 - 99 mg/dL    Comment: Glucose reference range applies only to samples taken after fasting for at least 8 hours.  Glucose, capillary     Status: Abnormal   Collection Time: 03/08/23  8:09 AM  Result Value Ref Range   Glucose-Capillary 161 (H) 70 - 99 mg/dL    Comment: Glucose reference range applies only to samples taken after fasting for at least 8 hours.   No results found.    Blood pressure 137/74, pulse 84, temperature 98.4 F (36.9 C), temperature source Oral, resp. rate 16, height 5\' 10"  (1.778 m), weight 114.2 kg, SpO2 99%.  Medical Problem List  and Plan: 1. Functional deficits secondary to ***  -patient may *** shower  -ELOS/Goals: *** 2.  Antithrombotics: -DVT/anticoagulation:  Pharmaceutical: Lovenox added  -antiplatelet therapy:  DAPT X 3 weeks followed by ASA alone 3. Right sided headaches/Pain Management:  tylenol prn.  4. Mood/Behavior/Sleep: LCSW to follow for evaluation and support.   -antipsychotic agents: N/A 5. Neuropsych/cognition: This patient is capable of making decisions on his own behalf. 6. Skin/Wound Care: Routine pressure relief measures.  7. Fluids/Electrolytes/Nutrition: Monitor I/O. Check CMET in am.  8. HTN: Monitor BP QID--continue apresoline, amlodipine and normodyne.   --was on amlodipine and hydrochlorothiazide PTA.  --supine HTN recorded and contributing to dizziness. Encourage fluid intake  --Lying SBP 162/103 w/P-78--> Sitting SBP 119/94 w/P-90. Orthostatic vitals ordered.  --Wears TEDs for chronic peripheral edema. Will order binder for use (question autonomic dysfunction)  9. T1DM: Hgb A1C-13.8 and poorly controlled due to lack of insulin pump/insurance issues.  --was on Lantus 40 units once a day w/counting carbs-BS running 300s for a while --Now on Levmir 20 units  BID with 2 units ac TID  --Will monitor BS ac/hs and use SSI for elevated BS and titrate as indicated, 10. CKD: SCr- 1.94 (02/2022). Now BUN/SCr 48/3.31---> 30/3.07   --on Sodium bicarb TID for NAGMA-  11. Hyperlipidemia: LDL-130 (was 396 a year ago).  --Leqvio prior authorization started on acute.  12. Dizziness: Vestibular evaluation and treat. Denies dizziness, double vision or nausea.  13. Constipation: Being managed with Senna BID and Miralax 14. Anemia of chronic disease?: Hgb 12.5 @ admission. Recheck in am.  15. OSA: Has been using CPAP at nights.   ***  Jacquelynn Cree, PA-C 03/08/2023

## 2023-03-08 NOTE — Progress Notes (Signed)
Physical Therapy Treatment Patient Details Name: Bradley Davis MRN: [id number] DOB: 12-07-1985 Today's Date: 03/08/2023   History of Present Illness 37 y.o. male presents to [facility] hospital on 02/27/2023 with nausea and vomiting for 2 days. Pt was unable to tolerate oral intake and was lightheaded. Pt was hypertensive on evaluation with elevated creatinine. Pt was admitted for AKI and CKD stage III. 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 Pt had episode of symptomatic orthostatic hypotension on 8/29. PMH: DM 1, HTN, CKD III.    PT Comments  Pt received in chair, agreeable to therapy session and with good participation and tolerance for transfer, gait and standing balance/exercise instruction. Pt needing up to modA for 7" step-up with HHA and has more difficulty with motor control on RLE and RLE with slightly decreased strength from LLE (pt is L hand dominant). Still orthostatic (see below) with sit>stand transitions with compression socks on, but symptoms more minimal this date and pt needing fewer seated breaks. Pt continues to benefit from PT services to progress toward functional mobility goals.    If plan is discharge home, recommend the following: A little help with walking and/or transfers;Assistance with cooking/housework;Assist for transportation;Help with stairs or ramp for entrance;A little help with bathing/dressing/bathroom   Can travel by private vehicle        Equipment Recommendations  Rolling walker (2 wheels);BSC/3in1 (pending progress; may not need 3in1)    Recommendations for Other Services Rehab consult     Precautions / Restrictions Precautions Precautions: Fall Precaution Comments: R medullary CVA with vestibular symptoms, orthostatic hypotension Required Braces or Orthoses:  (Compression socks) Restrictions Weight Bearing Restrictions: No     Mobility  Bed Mobility Overal bed mobility: Needs Assistance             General bed mobility comments:  received in chair/up in chair at end of session.    Transfers Overall transfer level: Needs assistance Equipment used: Rolling walker (2 wheels) Transfers: Sit to/from Stand Sit to Stand: Contact guard assist, Supervision           General transfer comment: from chair and EOB x10 reciprocal reps with arms crossed at chest; from EOB initially pt using BLE supported by bed frame when standing but with cues able to stand/sit with more of hip hinge and tap bottom on seat (of cushioned chair surface) then stand upright for more quad focus and no buckling, good eccentric control ~20 total reps STS    Ambulation/Gait Ambulation/Gait assistance: Contact guard assist Gait Distance (Feet): 50 Feet Assistive device: None Gait Pattern/deviations: Step-through pattern, Wide base of support Gait velocity: reduced   Pre-gait activities: see exercise section General Gait Details: High guard position, slow cadence, wide BOS with mild instability and pt self-correcting with stepping vs ankle strategy.   Stairs Stairs: Yes Stairs assistance: Min assist, Mod assist Stair Management: No rails, Step to pattern, Forwards (HHA on LUE) Number of Stairs: 10 General stair comments: pt performed foot taps leading with each foot x10 reps, then x5 step-ups with RLE (modA support needed at times) and x5 step-ups leading with LLE (minA mostly). Seated break after done with steps.   Wheelchair Mobility     Tilt Bed    Modified Rankin (Stroke Patients Only) Modified Rankin (Stroke Patients Only) Pre-Morbid Rankin Score: No symptoms Modified Rankin: Moderately severe disability     Balance Overall balance assessment: Needs assistance Sitting-balance support: No upper extremity supported, Feet supported Sitting balance-Leahy Scale: Good  Standing balance support: No upper extremity supported, During functional activity Standing balance-Leahy Scale: Fair Standing balance comment: Fair static  standing, Fair to Poor dynamic standing without AD                            Cognition Arousal: Alert Behavior During Therapy: WFL for tasks assessed/performed Overall Cognitive Status: Within Functional Limits for tasks assessed                                 General Comments: Pt A&O, motivated to progress balance/endurance so he can return to independence. Good attention to task. 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 Pt is left-hand dominant.        Exercises Other Exercises Other Exercises: standing hip flexion x10 reps ea cues for "high knees" Other Exercises: STS x 10 reps with arms crossed at chest x2 sets Other Exercises: standing BUE AROM: Rowing/alternating press and shoulder shrugs/rolls in ea direction x10 reps initially after BP reading taken Other Exercises: step-ups x10 reps, foot taps on 7" platform step x10 reps leading with each leg    General Comments General comments (skin integrity, edema, etc.): BP 146/88 (102) seated HR 88 bpm; BP 122/77 (88) HR 98 bpm standing, mild c/o headache and "feeling different" standing but not significantly dizzy or lightheaded today with activity.      Pertinent Vitals/Pain Pain Assessment Pain Assessment: Faces Faces Pain Scale: Hurts a little bit Pain Location: HA and RLE muscle/sensory changes Pain Descriptors / Indicators: Headache, Numbness Pain Intervention(s): Monitored during session, Repositioned     PT Goals (current goals can now be found in the care plan section) Acute Rehab PT Goals Patient Stated Goal: To return home and back to work. PT Goal Formulation: With patient Time For Goal Achievement: 03/17/23 Progress towards PT goals: Progressing toward goals    Frequency    Min 1X/week      PT Plan         AM-PAC PT "6 Clicks" Mobility   Outcome Measure  Help needed turning from your back to your side while in a flat  bed without using bedrails?: None Help needed moving from lying on your back to sitting on the side of a flat bed without using bedrails?: A Little (flat bed/no rails) Help needed moving to and from a bed to a chair (including a wheelchair)?: A Little Help needed standing up from a chair using your arms (e.g., wheelchair or bedside chair)?: A Little Help needed to walk in hospital room?: A Little Help needed climbing 3-5 steps with a railing? : A Little 6 Click Score: 19    End of Session Equipment Utilized During Treatment: Gait belt Activity Tolerance: Patient tolerated treatment well;Other (comment) (orthostatic hypotension but only minimal symptoms with TED hose donned) Patient left: in chair;with call bell/phone within reach;Other (comment) (pt agreeable to use call bell for OOB/chair mobility) Nurse Communication: Mobility status;Other (comment) (mild symptomatic orthostatic hypotension) PT Visit Diagnosis: Unsteadiness on feet (R26.81);Other abnormalities of gait and mobility (R26.89)     Time: 4098-1191 PT Time Calculation (min) (ACUTE ONLY): 29 min  Charges:    $Therapeutic Exercise: 8-22 mins $Neuromuscular Re-education: 8-22 mins PT General Charges $$ ACUTE PT VISIT: 1 Visit  Florina Ou., PTA Acute Rehabilitation Services Secure Chat Preferred 9a-5:30pm Office: [phone]    Angus Palms 03/08/2023, 2:47 PM

## 2023-03-08 NOTE — Progress Notes (Signed)
Patient ID: Bradley Davis, male   DOB: 23-Apr-1986, 37 y.o.   MRN: [id number] Met with the patient to review current situation, rehab process, team conference and plan of care. Patient noted T1DM and wears insulin pump; needs a new script as he cannot see Endocrinologist until 2/25. Discussed secondary risks including OSA; on sleep study with CPAP., HTN, DM. Given information on CMM/CKD diet recommendations. Discussed DAPT x 3 weeks then change to ASA solo.  Continue to follow along to address educational needs to facilitate preparation for discharge. Pamelia Hoit

## 2023-03-08 NOTE — Progress Notes (Signed)
Patient seen and examined at bedside today. He remains comfortable.BP stable this mrng. No new complains. Medically stable for dc ,dc summary and orders were placed yesterday.No change in the medical management

## 2023-03-08 NOTE — Progress Notes (Signed)
Pt c/o headache and nausea. Medicated with PRN tylenol and compazine. No c/o other symptoms. Pt stated he got nauseous following transferring to 4W17 and feeling the room was too hot. Temperature adjusted and fan provided to pt. Offered gingerale, pt refused.

## 2023-03-08 NOTE — Progress Notes (Signed)
Inpatient Rehabilitation Admissions Coordinator   I have insurance approval to admit to CIR today. Acute team and TOC made aware. I will make the arrangements.  Ottie Glazier, RN, MSN Rehab Admissions Coordinator [phone] 03/08/2023 9:57 AM

## 2023-03-08 NOTE — Progress Notes (Signed)
[name], DO  Physician Physical Medicine and Rehabilitation   PMR Pre-admission    Signed   Date of Service: 03/05/2023  4:04 PM  Related encounter: ED to Hosp-Admission (Current) from 02/27/2023 in [facility] 17M KIDNEY UNIT   Signed     Expand All Collapse All  Show:Clear all [x] Written[x] Templated[x] Copied  Added by: [x] Beckie Salts Tye [place], RN  [] Hover for details PMR Admission Coordinator Pre-Admission Assessment   Patient: Bradley Davis is an 37 y.o., male MRN: [id number] DOB: 1986-06-20 Height: 5\' 10"  (177.8 cm) Weight: 114.2 kg                                                                                                                                                  Insurance Information HMO:     PPO:      PCP:      IPA:      80/20:      OTHER:  PRIMARY: [employer] Medicaid      Policy#: [id number] medicaid # [id number]      Subscriber: pt CM Name: Rex Kras      Phone#: [phone]     Fax#: [phone] Pre-Cert#: [id number]  approved for 7 days 8/30 until 03/15/23    Employer:  Benefits:  Phone #: [phone]     Name: 8/27 Eff. Date: 01/07/23     Deduct: none      Out of Pocket Max: none      Life Max: none  CIR: per medicaid      SNF: per medicaid Outpatient: per medicaid     Co-Pay:  Home Health: per medicaid      Co-Pay:  DME: per medicaid     Co-Pay:  Providers: in network  SECONDARY: none      Policy#:       Phone#:    Artist:       Phone#:    The Data processing manager" for patients in Inpatient Rehabilitation Facilities with attached "Privacy Act Statement-Health Care Records" was provided and verbally reviewed with: N/A   Emergency Contact Information Contact Information       Name Relation Home Work Mobile    Joiner,Annette Mother [phone]             Other Contacts   None on File      Current Medical History  Patient Admitting Diagnosis: CVA   History of Present Illness: [name] is a 37 y.o. L handed male with hx of DM1 with A1c of 13.8; HTN, morbid obesity with BMI 36.1, prior CKD 3a with Cr 1.5-1.9 and HLD admitted 02/27/23 with inability to tolerate PO, lightheadedness and dizziness from [facility]- he was found to have a Cr of 3.31 and admitted to [facility] for AKI- however after a lot of IVF's  and pushing PO, didn't really improve- it was thought to be his new baseline. He was also clinically diagnosed with DM gastroparesis and started on Reglan IV- he's now receiving prn.  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    LDL 131- on max statin.A1c 13.8.Also had severe HTN upon admit and Norvasc and Hydralazine maxed out and restarted. - and ACEI stopped due to Cr however labetalol added- BP has been better.    Pt was started on DAPT; and has ativan for refractive nausea and Antivert for dizziness and Reglan 10 mg IV prn for gastroparesis. Ortho stasis noted with up activity.   Patient's medical [facility] from [facility] has been reviewed by the rehabilitation admission coordinator and physician.   Past Medical History      Past Medical History:  Diagnosis Date   CKD stage 3a, GFR 45-59 ml/min (HCC)     Diabetes mellitus     Hypertension          Has the patient had major surgery during 100 days prior to admission? No   Family History  family history is not on file.   Current Medications   Current Medications    Current Facility-Administered Medications:    acetaminophen (TYLENOL) tablet 1,000 mg, 1,000 mg, Oral, Q6H PRN, 1,000 mg at 03/07/23 2121 **OR** acetaminophen (TYLENOL) suppository 650 mg, 650 mg, Rectal, Q6H PRN, Howerter, Justin B, DO   amLODipine (NORVASC) tablet 10 mg, 10 mg, Oral, Daily, Howerter, Justin B, DO, 10 mg at 03/08/23  0900   aspirin EC tablet 81 mg, 81 mg, Oral, Daily, Richardo Priest, Erin C, NP, 81 mg at 03/08/23 0900   atorvastatin (LIPITOR) tablet 80 mg, 80 mg, Oral, Daily, Howerter, Justin B, DO, 80 mg at 03/08/23 0900   clopidogrel (PLAVIX) tablet 75 mg, 75 mg, Oral, Daily, Pearlean Brownie, Pramod S, MD, 75 mg at 03/08/23 0900   hydrALAZINE (APRESOLINE) tablet 100 mg, 100 mg, Oral, Q8H, Howerter, Justin B, DO, 100 mg at 03/08/23 0532   insulin aspart (novoLOG) injection 0-5 Units, 0-5 Units, Subcutaneous, QHS, Howerter, Justin B, DO, 2 Units at 03/06/23 2230   insulin aspart (novoLOG) injection 0-9 Units, 0-9 Units, Subcutaneous, TID WC, Howerter, Justin B, DO, 2 Units at 03/08/23 0900   insulin aspart (novoLOG) injection 2 Units, 2 Units, Subcutaneous, TID WC, Adhikari, Amrit, MD, 2 Units at 03/08/23 0901   insulin detemir (LEVEMIR) injection 20 Units, 20 Units, Subcutaneous, BID, Adhikari, Amrit, MD, 20 Units at 03/08/23 0900   labetalol (NORMODYNE) injection 10 mg, 10 mg, Intravenous, Q2H PRN, Renford Dills, Amrit, MD, 10 mg at 03/01/23 0657   labetalol (NORMODYNE) tablet 300 mg, 300 mg, Oral, Q8H, Adhikari, Amrit, MD, 300 mg at 03/08/23 0532   LORazepam (ATIVAN) injection 0.5 mg, 0.5 mg, Intravenous, Q6H PRN, Howerter, Justin B, DO, 0.5 mg at 02/28/23 1204   meclizine (ANTIVERT) tablet 25 mg, 25 mg, Oral, TID PRN, Renford Dills, Amrit, MD, 25 mg at 03/03/23 1247   melatonin tablet 3 mg, 3 mg, Oral, QHS PRN, Howerter, Justin B, DO, 3 mg at 03/04/23 2143   metoCLOPramide (REGLAN) injection 10 mg, 10 mg, Intravenous, Q8H PRN, Adhikari, Amrit, MD, 10 mg at 03/05/23 0408   ondansetron (ZOFRAN) injection 4 mg, 4 mg, Intravenous, Q6H PRN, Howerter, Justin B, DO, 4 mg at 03/06/23 2227  Oral care mouth rinse, 15 mL, Mouth Rinse, PRN, Howerter, Justin B, DO   polyethylene glycol (MIRALAX / GLYCOLAX) packet 17 g, 17 g, Oral, Daily, Adhikari, Amrit, MD, 17 g at 03/07/23 1046   senna (SENOKOT) tablet 8.6 mg, 1 tablet, Oral, BID, Adhikari,  Amrit, MD, 8.6 mg at 03/07/23 1046   sodium bicarbonate tablet 650 mg, 650 mg, Oral, TID, Adhikari, Amrit, MD, 650 mg at 03/08/23 0900     Patients Current Diet:  Diet Order                  Diet Carb Modified             Diet Carb Modified Fluid consistency: Thin; Room service appropriate? Yes  Diet effective now                       Precautions / Restrictions Precautions Precautions: Fall Precaution Comments: R medullary CVA with vestibular symptoms, orthostatic hypotension Restrictions Weight Bearing Restrictions: No    Has the patient had 2 or more falls or a fall with injury in the past year?No   Prior Activity Level Community (5-7x/wk): independent, active, working and driving   Prior Functional Level Prior Function Prior Level of Function : Independent/Modified Independent, Driving, Working/employed Mobility Comments: no AD ADLs Comments: indep, works as a Investment banker, 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  Independent   Indoor Mobility: Did the patient need assistance with walking from room to room (with or without device)? Independent   Stairs: Did the patient need assistance with internal or external stairs (with or without device)? Independent   Functional Cognition: Did the patient need help planning regular tasks such as shopping or remembering to take medications? Independent   Patient Information Are you of Hispanic, Latino/a,or Spanish origin?: A. No, not of Hispanic, Latino/a, or Spanish origin What is your race?: B. Black or African American Do you need or want an interpreter to communicate with a doctor or health care staff?: 0. No   Patient's Response To:  Health Literacy and Transportation Is the patient able to respond to health literacy and transportation needs?: Yes Health Literacy - How often do you need to have someone help you when you read  instructions, pamphlets, or other written material from your doctor or pharmacy?: Never In the past 12 months, has lack of transportation kept you from medical appointments or from getting medications?: No In the past 12 months, has lack of transportation kept you from meetings, work, or from getting things needed for daily living?: No   Journalist, newspaper / Equipment Home Assistive Devices/Equipment: None Home Equipment: None   Prior Device Use: Indicate devices/aids used by the patient prior to current illness, exacerbation or injury? None of the above   Current Functional Level Cognition   Overall Cognitive Status: Within Functional Limits for tasks assessed General Comments: Pt A&O, motivated to progress balance/endurance so he can return to independence. Good attention to task.    Extremity Assessment (includes Sensation/Coordination)   Upper Extremity Assessment: Overall WFL for tasks assessed (MMT, ROM, coordination and dexterity was [facility])  Lower Extremity Assessment: Defer to PT evaluation     ADLs   Overall ADL's : Needs assistance/impaired Eating/Feeding: Independent, Sitting Grooming: Contact guard assist, Wash/dry face, Standing Grooming Details (indicate cue type and reason): standing at sink Upper Body Bathing: Set up,  Sitting Lower Body Bathing: Moderate assistance, Sit to/from stand Upper Body Dressing : Set up, Sitting Lower Body Dressing: Moderate assistance, Sit to/from stand Toilet Transfer: Moderate assistance, Rolling walker (2 wheels) Toileting- Clothing Manipulation and Hygiene: Supervision/safety, Sitting/lateral lean Functional mobility during ADLs: Contact guard assist, Rolling walker (2 wheels) General ADL Comments: Pt declined need for dressing ADLs at this time, focused session on progressing with balance. Pt ambulating in hall carrying backpack (to simulate what he carries for work) on his L and R shoulder with minor LOBs noted throoughout ambulation  needing min A to recover. Then proceeded to work on picking up light weight objects from floor level to which he needed min A to help maintain balance     Mobility   Overal bed mobility: Needs Assistance Bed Mobility: Supine to Sit Rolling: Supervision Sidelying to sit: Supervision Supine to sit: Modified independent (Device/Increase time) Sit to supine: Supervision General bed mobility comments: use of bed features/rail     Transfers   Overall transfer level: Needs assistance Equipment used: Rolling walker (2 wheels) Transfers: Sit to/from Stand Sit to Stand: Contact guard assist General transfer comment: from EOB x10 in a row with arms crossed at chest, pt using BLE supported posterior by bed frame initially, able to adjust to stand without support after cues. Mild c/o "on a boat" sensation with initial stand (found to be orthostatic) but able to perform reciprocal transfers after seated break.     Ambulation / Gait / Stairs / Wheelchair Mobility   Ambulation/Gait Ambulation/Gait assistance: Contact guard assist, Min assist Gait Distance (Feet): 80 Feet Assistive device: Rolling walker (2 wheels) Gait Pattern/deviations: Step-through pattern General Gait Details: Pt with 3 instances of rightward loss of balance, requiring stepping strategy and UE support to correct. PTA provides cues for segmental turning in an effort to compensate for vestibular dysfunction. Gait velocity: reduced Gait velocity interpretation: <1.8 ft/sec, indicate of risk for recurrent falls     Posture / Balance Balance Overall balance assessment: Needs assistance Sitting-balance support: No upper extremity supported, Feet supported Sitting balance-Leahy Scale: Good Standing balance support: No upper extremity supported, During functional activity Standing balance-Leahy Scale: Fair Standing balance comment: stands and may even ambulate without UE support but has LOBs     Special needs/care consideration Hgb  A1c 13.8, has been on insulin pump in the past. Would like assist in getting script to restart his pump        Previous Home Environment  Living Arrangements: Alone  Lives With: Alone Available Help at Discharge: Available 24 hours/day (he states with his family and can get initial 24/7 as needed) Type of Home: Apartment Home Layout: One level Home Access: Level entry Bathroom Shower/Tub: Engineer, manufacturing systems: Standard Bathroom Accessibility: Yes How Accessible: Accessible via walker Home Care Services: No   Discharge Living Setting Plans for Discharge Living Setting: Patient's home, Alone Type of Home at Discharge: Apartment Discharge Home Layout: One level Discharge Home Access: Level entry Discharge Bathroom Shower/Tub: Tub/shower unit Discharge Bathroom Toilet: Standard Discharge Bathroom Accessibility: Yes How Accessible: Accessible via walker Does the patient have any problems obtaining your medications?: No   Social/Family/Support Systems Patient 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, political party Information: Mom, Engineer, building services Anticipated Caregiver: Mom and Family Anticipated Caregiver's Contact Information: see contacts Caregiver Availability: 24/7 Discharge Plan Discussed with Primary Caregiver: Yes Is Caregiver In Agreement with Plan?: Yes Does Caregiver/Family have Issues with Lodging/Transportation while Pt is in Rehab?: No   Goals Patient/Family  Goal for Rehab: Mod I to supervision with PT and OT Expected length of stay: ELOS 5 to 7 days Pt/Family Agrees to Admission and willing to participate: Yes Program Orientation Provided & Reviewed with Pt/Caregiver Including Roles  & Responsibilities: Yes   Decrease burden of Care through IP rehab admission: n/a   Possible need for SNF placement upon discharge:not anticipated   Patient Condition: This patient's medical and functional status has changed since the consult dated: 03/05/23 in which  the Rehabilitation Physician determined and documented that the patient's condition is appropriate for intensive rehabilitative care in an inpatient rehabilitation facility. See "History of Present Illness" (above) for medical update. Functional changes are: min to CGA assist. Patient's medical and functional status update has been discussed with the Rehabilitation physician and patient remains appropriate for inpatient rehabilitation. Will admit to inpatient rehab today.   Preadmission Screen Completed By:  Clois Dupes, RN MSN 03/08/2023 9:59 AM ______________________________________________________________________   Discussed status with Dr. Shearon Stalls on 03/08/23 at [id number] and received approval for admission today.   Admission Coordinator:  Clois Dupes RN MSN time [id number] Date 03/08/23            Revision History

## 2023-03-08 NOTE — Progress Notes (Signed)
Bradley Rouge, MD  Physician Physical Medicine and Rehabilitation   Consult Note    Signed   Date of Service: 03/05/2023  2:42 PM  Related encounter: ED to Hosp-Admission (Current) from 02/27/2023 in [facility]M KIDNEY UNIT   Signed     Expand All Collapse All  Show:Clear all [x] Written[x] Templated[] Copied  Added by: [x] Lovorn, Aundra Millet, MD  [] Hover for details          Physical Medicine and Rehabilitation Consult Reason for Consult:CIR acute rehab consult Referring Physician: DR Burnadette Pop     HPI: Bradley CHIDESTER is a 37 y.o. L handed male with hx of DM1 with A1c of 13.8; HTN, morbid obesity with BMI 36.1, prior CKD 3a with Cr 1.5-1.9 and HLD admitted 02/27/23 with inability to tolerate PO, lightheadedness and dizziness from [facility]- he was found to have a Cr of 3.31 and admitted to [facility] for AKI- however after a lot of IVFs and pushing PO, didn't really improve- it was thought to be his new baseline.  He was also clinically diagnosed with DM gastroparesis and started on Reglan IV- he's now receiving prn.  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    LDL 131- on max statin A1c 13.8 Also had severe HTN upon admit and Norvasc and Hydralazine maxxed out and restarted. - and ACEI stopped due to Cr however labetolol added- BP has been better.    Pt was started on DAPT; and has ativan for refractive nausea and Antivert for dizziness and Reglan 10 mg IV prn for gastroparesis.    Pt reports when at rest, now not having dizziness/vertigo/nausea, but when moves more than a few cm, he gets sick again.    Has walked 90 ft CGA but having loss of balance regularly.    Pt reports LBM yesterday Trying to set up /go back to insulin pump- used to also use  freestyle libre to monitor BG's.    On regular thin diet Owns his own business.    Said peeing with urinal Strength "OK".    Review of Systems  Constitutional:  Positive for malaise/fatigue. Negative for chills and fever.  HENT: Negative.    Eyes:  Negative for blurred vision.  Respiratory:  Negative for cough, sputum production and shortness of breath.   Cardiovascular:  Negative for chest pain, orthopnea and leg swelling.  Gastrointestinal:  Positive for nausea. Negative for vomiting.  Genitourinary:  Positive for frequency and urgency. Negative for dysuria.  Musculoskeletal:  Negative for back pain, joint pain, myalgias and neck pain.  Skin: Negative.   Neurological:  Positive for dizziness. Negative for sensory change and focal weakness.  Endo/Heme/Allergies:  Positive for polydipsia.  Psychiatric/Behavioral:  Positive for depression. Negative for hallucinations and suicidal ideas. The patient has insomnia.   All other systems reviewed and are negative.       Past Medical History:  Diagnosis Date   CKD stage 3a, GFR 45-59 ml/min (HCC)     Diabetes mellitus     Hypertension               Past Surgical History:  Procedure Laterality Date   INCISION AND DRAINAGE ABSCESS Right 04/21/2021    Procedure: INCISION, DRAINAGE, DEBRIDEMENT OF RIGHT THIGH/GROIN INFECTION;  Surgeon: Abigail Miyamoto, MD;  Location: [facility];  Service: General;  Laterality: Right;   IRRIGATION AND DEBRIDEMENT ABSCESS Right 04/23/2021    Procedure: IRRIGATION AND DEBRIDEMENT RIGHT GROIN WOUND; RIGHT THIGH DRESSING CHANGE;  Surgeon: Abigail Miyamoto, MD;  Location: [facility] ORS;  Service: General;  Laterality: Right;   IRRIGATION AND DEBRIDEMENT ABSCESS Right 04/26/2021    Procedure: reexploration and debridement of soft tissue infection right groin;  Surgeon: Emelia Loron, MD;  Location: [facility] ORS;  Service: General;  Laterality: Right;        History reviewed. No pertinent family history.     Social  History:  reports that he has never smoked. He has never used smokeless tobacco. He reports that he does not currently use alcohol. He reports that he does not use drugs. Allergies:  Allergies       Allergies  Allergen Reactions   Ibuprofen Swelling      Pt reports taking after reported allergy w/ no reactions            Medications Prior to Admission  Medication Sig Dispense Refill   acetaminophen (TYLENOL) 325 MG tablet Take 650 mg by mouth as needed for mild pain or headache.       amLODipine (NORVASC) 10 MG tablet Take 1 tablet (10 mg total) by mouth daily. 30 tablet 0   atorvastatin (LIPITOR) 80 MG tablet Take 1 tablet (80 mg total) by mouth daily.       benazepril (LOTENSIN) 20 MG tablet Take 20 mg by mouth daily.       HUMALOG KWIKPEN 100 UNIT/ML KwikPen Inject 1-30 Units into the skin in the morning, at noon, in the evening, and at bedtime. Per sliding scale       hydrochlorothiazide (HYDRODIURIL) 25 MG tablet Take 50 mg by mouth daily.       Insulin Glargine (BASAGLAR KWIKPEN) 100 UNIT/ML Inject 40 Units into the skin at bedtime.       sildenafil (VIAGRA) 50 MG tablet Take 50 mg by mouth as needed for erectile dysfunction.       sodium chloride (OCEAN) 0.65 % nasal spray Place 2 sprays into the nose daily as needed for congestion.       Vitamin D, Ergocalciferol, (DRISDOL) 1.25 MG (50000 UNIT) CAPS capsule Take 50,000 Units by mouth once a week. 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 Functional Status:  Mobility: Bed Mobility Overal bed mobility: Needs Assistance Bed Mobility: Rolling, Sidelying to Sit, Sit to Supine Rolling: Supervision Sidelying to sit: Supervision Sit to supine: Supervision Transfers Overall transfer level: Needs assistance Equipment used: Rolling walker (2 wheels) Transfers: Sit to/from Stand Sit to Stand: Supervision General transfer comment: CGA for STS, up to mod A needed for ambulation due to LOBs Ambulation/Gait Ambulation/Gait assistance: Contact guard assist Gait Distance (Feet): 80 Feet (additional trial of 60') Assistive device: Rolling walker (2 wheels) Gait Pattern/deviations: Step-through pattern General Gait Details: pt with 2 instances of rightward loss of balance, requiring stepping strategy and UE support to correct. PT provides cues  for segmental turning in an effort to compensate for vestibular dysfunction. Gait velocity: reduced Gait velocity interpretation: <1.8 ft/sec, indicate of risk for recurrent falls   ADL: ADL Overall ADL's : Needs assistance/impaired Eating/Feeding: Independent, Sitting Grooming: Set up, Sitting Grooming Details (indicate cue type and reason): sitting for safety, mod A for LOB in standing Upper Body Bathing: Set up, Sitting Lower Body Bathing: Moderate assistance, Sit to/from stand Upper Body Dressing : Set up, Sitting Lower Body Dressing: Moderate assistance, Sit to/from stand Toilet Transfer: Moderate assistance, Rolling walker (2 wheels) Toileting- Clothing Manipulation and Hygiene: Supervision/safety, Sitting/lateral lean Functional mobility during ADLs: Moderate assistance, Rolling walker (2 wheels) General ADL Comments: ADLs completed in sitting for safety, up to mod A needed in standing due to 2x significant LOBs   Cognition: Cognition Overall Cognitive Status: Within Functional Limits for tasks assessed Cognition Arousal:  Alert Behavior During Therapy: WFL for tasks assessed/performed Overall Cognitive Status: Within Functional Limits for tasks assessed General Comments: Overall WFL, anxious about new balance deficits   Blood pressure (!) 149/82, pulse 90, temperature 98.7 F (37.1 C), resp. rate 20, height 5\' 10"  (1.778 m), weight 114.2 kg, SpO2 96%. Physical Exam Vitals and nursing note reviewed.  Constitutional:      General: He is not in acute distress.    Appearance: He is obese. He is ill-appearing.     Comments: On side in bed; curled up; sleeping initially; woke but still slightly delayed in responses; sleepy; NAD  HENT:     Head: Normocephalic and atraumatic.     Comments: No facial droop Tongue midline    Right Ear: External ear normal.     Left Ear: External ear normal.     Nose: Nose normal. No congestion.     Mouth/Throat:     Mouth: Mucous membranes are dry.     Pharynx: Oropharynx is clear. No oropharyngeal exudate.  Eyes:     General:        Right eye: No discharge.        Left eye: No discharge.     Extraocular Movements: Extraocular movements intact.     Comments: Mild nystagmus B/L  Cardiovascular:     Rate and Rhythm: Normal rate and regular rhythm.     Heart sounds: Normal heart sounds. No murmur heard.    No gallop.  Pulmonary:     Effort: Pulmonary effort is normal. No respiratory distress.     Breath sounds: Normal breath sounds. No wheezing, rhonchi or rales.  Abdominal:     General: Bowel sounds are normal. There is no distension.     Palpations: Abdomen is soft.     Tenderness: There is no abdominal tenderness.     Comments: Protuberant; soft  Musculoskeletal:     Cervical back: Neck supple. No tenderness.     Comments: 5/5 in Ue's and LE's B/L    Skin:    General: Skin is warm and dry.     Comments: Has IV- looks OK  Neurological:     Mental Status: He is alert.     Sensory: No sensory deficit.     Comments: L>R searching pattern finger to nose  Intact to  light touch in all 4 extremities- except decreased in ankles/feet from neuropathy B/L Ox3, but very slightly delayed- could be just woke up    Psychiatric:     Comments: Flat affect        Lab Results Last 24 Hours  Results for orders placed or performed during the hospital encounter of 02/27/23 (from the past 24 hour(s))  Glucose, capillary     Status: Abnormal    Collection Time: 03/04/23  3:53 PM  Result Value Ref Range    Glucose-Capillary 241 (H) 70 - 99 mg/dL  Glucose, capillary     Status: Abnormal    Collection Time: 03/04/23  9:26 PM  Result Value Ref Range    Glucose-Capillary 224 (H) 70 - 99 mg/dL  Glucose, capillary     Status: Abnormal    Collection Time: 03/05/23  7:32 AM  Result Value Ref Range    Glucose-Capillary 201 (H) 70 - 99 mg/dL  Basic metabolic panel     Status: Abnormal    Collection Time: 03/05/23 10:05 AM  Result Value Ref Range    Sodium 138 135 - 145 mmol/L    Potassium 4.1 3.5 - 5.1 mmol/L    Chloride 108 98 - 111 mmol/L    CO2 20 (L) 22 - 32 mmol/L    Glucose, Bld 225 (H) 70 - 99 mg/dL    BUN 32 (H) 6 - 20 mg/dL    Creatinine, Ser 2.59 (H) 0.61 - 1.24 mg/dL    Calcium 8.8 (L) 8.9 - 10.3 mg/dL    GFR, Estimated 26 (L) >60 mL/min    Anion gap 10 5 - 15  Lipid panel     Status: Abnormal    Collection Time: 03/05/23 10:05 AM  Result Value Ref Range    Cholesterol 193 0 - 200 mg/dL    Triglycerides 563 <875 mg/dL    HDL 41 >64 mg/dL    Total CHOL/HDL Ratio 4.7 RATIO    VLDL 22 0 - 40 mg/dL    LDL Cholesterol 332 (H) 0 - 99 mg/dL  Glucose, capillary     Status: Abnormal    Collection Time: 03/05/23 11:17 AM  Result Value Ref Range    Glucose-Capillary 190 (H) 70 - 99 mg/dL       Imaging Results (Last 48 hours)  MR ANGIO HEAD WO CONTRAST   Result Date: 03/04/2023 CLINICAL DATA:  Stroke follow-up EXAM: MRA NECK WITHOUT CONTRAST MRA HEAD WITHOUT CONTRAST TECHNIQUE: Angiographic images of the Circle of Willis were acquired using MRA  technique without intravenous contrast. COMPARISON:  None Available. FINDINGS: MRA NECK FINDINGS Aortic arch: Normal Right carotid system: Normal Left carotid system: Normal Vertebral arteries: Right dominant.  Patent with antegrade flow Other: None. MRA HEAD FINDINGS POSTERIOR CIRCULATION: --Vertebral arteries: Normal --Inferior cerebellar arteries: Normal. --Basilar artery: Normal. --Superior cerebellar arteries: Normal. --Posterior cerebral arteries: Normal. ANTERIOR CIRCULATION: --Intracranial internal carotid arteries: Normal. --Anterior cerebral arteries (ACA): Normal. --Middle cerebral arteries (MCA): Normal. Other: None. IMPRESSION: Normal MRA of the head and neck. Electronically Signed   By: Deatra Robinson M.D.   On: 03/04/2023 23:01    MR ANGIO NECK WO CONTRAST   Result Date: 03/04/2023 CLINICAL DATA:  Stroke follow-up EXAM: MRA NECK WITHOUT CONTRAST MRA HEAD WITHOUT CONTRAST TECHNIQUE: Angiographic images of the Circle of Willis were acquired using MRA technique without intravenous contrast. COMPARISON:  None Available. FINDINGS: MRA NECK FINDINGS Aortic arch: Normal Right carotid system: Normal Left carotid system: Normal Vertebral arteries: Right dominant.  Patent with antegrade flow Other: None. MRA HEAD FINDINGS POSTERIOR CIRCULATION: --Vertebral arteries: Normal --Inferior cerebellar arteries: Normal. --Basilar artery: Normal. --Superior cerebellar arteries: Normal. --Posterior cerebral arteries: Normal. ANTERIOR CIRCULATION: --Intracranial internal carotid arteries: Normal. --Anterior cerebral arteries (ACA): Normal. --Middle cerebral arteries (MCA):  Normal. Other: None. IMPRESSION: Normal MRA of the head and neck. Electronically Signed   By: Deatra Robinson M.D.   On: 03/04/2023 23:01    MR BRAIN WO CONTRAST   Result Date: 03/04/2023 CLINICAL DATA:  Syncope/presyncope with cerebrovascular cause suspected EXAM: MRI HEAD WITHOUT CONTRAST TECHNIQUE: Multiplanar, multiecho pulse sequences of the  brain and surrounding structures were obtained without intravenous contrast. COMPARISON:  Head CT from yesterday FINDINGS: Brain: 8 mm area of restricted diffusion in the lateral right medulla. Tiny area of more week diffusion hyperintensity along the body of the right lateral ventricle. Although demyelinating process is considered given the patient's young age in the distribution, this is more consistent with small vessel ischemia given patient's history of diabetes, hypertension, and renal failure. No acute hemorrhage, hydrocephalus, masslike finding, or collection. Mild chronic white matter disease in the bilateral cerebral hemisphere. Vascular: Major flow voids are preserved, including the right V4 segment. Skull and upper cervical spine: Normal marrow signal Sinuses/Orbits: Unremarkable Other: Posterior scalp scarring. IMPRESSION: 1. Small acute infarct in the lateral right medulla. 2. Small subacute infarct in the deep right cerebral white matter adjacent to the lateral ventricle. 3. Mild chronic white matter disease. Electronically Signed   By: Tiburcio Pea M.D.   On: 03/04/2023 11:03         Assessment/Plan: Diagnosis:  R medullar infarct with dizziness/vertigo/nausea Does the need for close, 24 hr/day medical supervision in concert with the patient's rehab needs make it unreasonable for this patient to be served in a less intensive setting? 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 Yes Does the patient require coordinated care of a physician, rehab nurse, therapy disciplines of PT and OT to address physical and functional deficits in the context of  the above medical diagnosis(es)? Yes Addressing deficits in the following areas: balance, endurance, locomotion, strength, transferring, bowel/bladder control, bathing, dressing, feeding, grooming, and toileting Can the patient actively participate in an intensive therapy program of at least 3 hrs of therapy per day at least 5 days per week? Yes The potential for patient to make measurable gains while on inpatient rehab is good Anticipated functional outcomes upon discharge from inpatient rehab are modified independent and supervision  with PT, modified independent and supervision with OT, n/a with SLP. Estimated rehab length of stay to reach the above functional goals is: 12-14 days Anticipated discharge destination: Home Overall Rehab/Functional Prognosis: good   RECOMMENDATIONS: This patient's condition is appropriate for continued rehabilitative care in the following setting: CIR Patient has agreed to participate in recommended program. Yes Note that insurance prior authorization may be required for reimbursement for recommended care.   Comment:  1. Would benefit if possible from scheduling PO Reglan for gastroparesis.   2. Zofran in 20% of cases can cause constipation, so might benefit from phenergan more than Zofran, in spite of sedation that can come with it 3. Pt wants ot get back to his prior insulin pump and Freestyle libre- might get better control of his DM1 if possible? 4. Will submit for CIR via admissions coordinators.  5. I agree with Leqvio and stroke apnea study- due to body habitus and falling asleep during day I agree with plan.  6. Thank you for this  consult.        Bradley Rouge, MD 03/05/2023      I spent a total of  64   minutes on total care today- >50% coordination of care- due to  Review of chart- extensive- vitals, labs and imaging; also examination and interview of pt; and typing up consult as detailed above.          Routing History

## 2023-03-08 NOTE — H&P (Signed)
Physical Medicine and Rehabilitation Admission H&P        Chief Complaint  Patient presents with   Functional deficits due to stroke      HPI:  Bradley Davis is a 37 year old left handed male with history of T1DM, CKD 3a, HTN who was admitted on 02/27/23 with 2 day history of N/V with inability to keep down food or meds progressing to dizziness with standing attempts. He was found to have acute on chronic renal failure with SCR up to 3.6 and treated with  2 L fluid bolus and started on IVF for hydration. KUB done showed small to moderate stool burden and he was started on bowel regimen with good results. Therapy initiated and he was noted to have loss of balance to the right with reports of decreased sensation on RLE, unstable and staggering gait as well as orthostatic changes.  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    Dr. Derry Lory consulted for input and MRA  brain ordered which was WNL. 2 D echo with bubble study revealed EF 55-60% with no evidence of shunt. Renal ultrasound showed normal kidneys.  Dr. Pearlean Brownie felt that stroke was due to small vessel disease. Patient also felt to be high risk for sleep apnea and qualified for Sleep Smart study based on overnight testing. Right facial numbness improved, denies dizziness or vision changes and continues to have persistent HA.        Review of Systems  HENT:  Negative for hearing loss and tinnitus.   Eyes:  Negative for blurred vision and double vision.  Respiratory:  Negative for cough and shortness of breath.   Cardiovascular:  Negative for chest pain and palpitations.  Gastrointestinal:  Negative for constipation, heartburn, nausea and vomiting.  Genitourinary:  Negative for dysuria and urgency.       Gets up 3-4 times at night to urinate  Musculoskeletal:  Negative for back pain, joint pain, myalgias and  neck pain.  Neurological:  Positive for dizziness (with activity and last 30 minutes), weakness and headaches (persistent).  Psychiatric/Behavioral:  The patient does not have insomnia.             Past Medical History:  Diagnosis Date   CKD stage 3a, GFR 45-59 ml/min (HCC)     Diabetes mellitus     Hypertension     Necrotizing fasciitis (HCC) 04/2021    right groin               Past Surgical History:  Procedure Laterality Date   INCISION AND DRAINAGE ABSCESS Right 04/21/2021    Procedure: INCISION, DRAINAGE, DEBRIDEMENT OF RIGHT THIGH/GROIN INFECTION;  Surgeon: Abigail Miyamoto, MD;  Location: [facility];  Service: General;  Laterality: Right;   IRRIGATION AND DEBRIDEMENT ABSCESS Right 04/23/2021    Procedure: IRRIGATION AND DEBRIDEMENT RIGHT GROIN WOUND; RIGHT THIGH DRESSING CHANGE;  Surgeon: Abigail Miyamoto, MD;  Location: [facility];  Service: General;  Laterality: Right;   IRRIGATION AND DEBRIDEMENT ABSCESS Right 04/26/2021    Procedure: reexploration and debridement of soft tissue infection right groin;  Surgeon: Emelia Loron, MD;  Location: [facility];  Service: General;  Laterality: Right;   RETINAL DETACHMENT SURGERY   2022   TONSILLECTOMY   2003               Family History  Problem Relation Age of Onset  Cancer Mother     Congestive Heart Failure Father     Cancer Sister            Social History: Lives alone. Self employed chef--owns a food truck.  He reports that he has never smoked. He has never used smokeless tobacco. He reports that he drinks occasionally. He reports that he does not use drugs.     Allergies       Allergies  Allergen Reactions   Ibuprofen Swelling      Pt reports taking after reported allergy w/ no reactions              Medications Prior to Admission  Medication Sig Dispense Refill   acetaminophen (TYLENOL) 325 MG tablet Take 650 mg by mouth as needed for mild pain or headache.       amLODipine (NORVASC) 10 MG tablet Take 1  tablet (10 mg total) by mouth daily. 30 tablet 0   atorvastatin (LIPITOR) 80 MG tablet Take 1 tablet (80 mg total) by mouth daily.       benazepril (LOTENSIN) 20 MG tablet Take 20 mg by mouth daily.       HUMALOG KWIKPEN 100 UNIT/ML KwikPen Inject 1-30 Units into the skin in the morning, at noon, in the evening, and at bedtime. Per sliding scale       hydrochlorothiazide (HYDRODIURIL) 25 MG tablet Take 50 mg by mouth daily.       Insulin Glargine (BASAGLAR KWIKPEN) 100 UNIT/ML Inject 40 Units into the skin at bedtime.       sildenafil (VIAGRA) 50 MG tablet Take 50 mg by mouth as needed for erectile dysfunction.       sodium chloride (OCEAN) 0.65 % nasal spray Place 2 sprays into the nose daily as needed for congestion.       Vitamin D, Ergocalciferol, (DRISDOL) 1.25 MG (50000 UNIT) CAPS capsule Take 50,000 Units by mouth once a week. Monday       [DISCONTINUED] hydrALAZINE (APRESOLINE) 100 MG tablet Take 1 tablet (100 mg total) by mouth every 8 (eight) hours. 90 tablet 0            Home: Home Living Family/patient expects to be discharged to:: Private residence Living Arrangements: Alone Available Help at Discharge: Available 24 hours/day (he states with his family and can get initial 24/7 as needed) Type of Home: Apartment Home Access: Level entry Home Layout: One level Bathroom Shower/Tub: Associate Professor: Yes Home Equipment: None  Lives With: Alone   Functional History: Prior Function Prior Level of Function : Independent/Modified Independent, Driving, Working/employed Mobility Comments: no AD ADLs Comments: indep, works as a Investment banker, 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   Functional Status:  Mobility: Bed Mobility Overal bed mobility: Needs Assistance Bed Mobility: Supine to [employer]: Supervision Sidelying to sit: Supervision Supine to sit: Modified independent (Device/Increase time) Sit to supine:  Supervision General bed mobility comments: use of bed features/rail Transfers Overall transfer level: Needs assistance Equipment used: Rolling walker (2 wheels) Transfers: Sit to/from Stand Sit to Stand: Contact guard assist General transfer comment: from EOB x10 in a row with arms crossed at chest, pt using BLE supported posterior by bed frame initially, able to adjust to stand without support after cues. Mild c/o "on a boat" sensation with initial stand (found to be orthostatic) but able to perform reciprocal transfers after seated break. Ambulation/Gait Ambulation/Gait assistance: Contact guard assist, Min assist Gait Distance (  Feet): 80 Feet Assistive device: Rolling walker (2 wheels) Gait Pattern/deviations: Step-through pattern General Gait Details: Pt with 3 instances of rightward loss of balance, requiring stepping strategy and UE support to correct. PTA provides cues for segmental turning in an effort to compensate for vestibular dysfunction. Gait velocity: reduced Gait velocity interpretation: <1.8 ft/sec, indicate of risk for recurrent falls   ADL: ADL Overall ADL's : Needs assistance/impaired Eating/Feeding: Independent, Sitting Grooming: Contact guard assist, Wash/dry face, Standing Grooming Details (indicate cue type and reason): standing at sink Upper Body Bathing: Set up, Sitting Lower Body Bathing: Moderate assistance, Sit to/from stand Upper Body Dressing : Set up, Sitting Lower Body Dressing: Moderate assistance, Sit to/from stand Toilet Transfer: Moderate assistance, Rolling walker (2 wheels) Toileting- Clothing Manipulation and Hygiene: Supervision/safety, Sitting/lateral lean Functional mobility during ADLs: Contact guard assist, Rolling walker (2 wheels) General ADL Comments: Pt declined need for dressing ADLs at this time, focused session on progressing with balance. Pt ambulating in hall carrying backpack (to simulate what he carries for work) on his L and R  shoulder with minor LOBs noted throoughout ambulation needing min A to recover. Then proceeded to work on picking up light weight objects from floor level to which he needed min A to help maintain balance   Cognition: Cognition Overall Cognitive Status: Within Functional Limits for tasks assessed Cognition Arousal: Alert Behavior During Therapy: [facility] for tasks assessed/performed Overall Cognitive Status: Within Functional Limits for tasks assessed General Comments: Pt A&O, motivated to progress balance/endurance so he can return to independence. Good attention to task.     Blood pressure 137/74, pulse 84, temperature 98.4 F (36.9 C), temperature source Oral, resp. rate 16, height 5\' 10"  (1.778 m), weight 114.2 kg, SpO2 99%. Physical Exam  Constitutional: No apparent distress. Appropriate appearance for age.  HENT: No JVD. Neck Supple. Trachea midline. Atraumatic, normocephalic. Eyes: PERRLA. EOMI. Visual fields grossly intact. No appreciable nystagmus on exam.  Cardiovascular: RRR, no murmurs/rub/gallops. No Edema, + TEDs.  Respiratory: CTAB. No rales, rhonchi, or wheezing. On RA.  Abdomen: + bowel sounds, normoactive. No distention or tenderness.  Skin: C/D/I. No apparent lesions. MSK:      No apparent deformity.       Neurologic exam:  Cognition: AAO to person, place, time and event.  Language: Fluent, No substitutions or neoglisms. No dysarthria. Names 3/3 objects correctly.  Memory: Recalls 3/3 objects at 5 minutes. No apparent deficits  Insight: Good insight into current condition.  Mood: Pleasant affect, appropriate mood.  Sensation: To light touch intact in BL UEs and Les. Mild stocking pattern b/l neuropathy.  Reflexes: 2+ in BL UE and LEs. Negative Hoffman's and babinski signs bilaterally.  CN: 2-12 grossly intact.  Coordination: No apparent tremors. Mild L sided ataxia on FTN, HTS. Spasticity: MAS 0 in all extremities.  Strength:                RUE: 5/5 SA, 5/5 EF, 5/5  EE, 5/5 WE, 5/5 FF, 5/5 FA                 LUE: 5/5 SA, 5/5 EF, 5/5 EE, 5/5 WE, 5/5 FF, 5/5 FA                 RLE: 5/5 HF, 5/5 KE, 5/5 DF, 5/5 EHL, 5/5 PF                 LLE:  5/5 HF, 5/5 KE, 5/5 DF, 5/5 EHL, 5/5 PF  Lab Results Last 48 Hours        Results for orders placed or performed during the hospital encounter of 02/27/23 (from the past 48 hour(s))  Glucose, capillary     Status: Abnormal    Collection Time: 03/06/23  4:36 PM  Result Value Ref Range    Glucose-Capillary 179 (H) 70 - 99 mg/dL      Comment: Glucose reference range applies only to samples taken after fasting for at least 8 hours.  Glucose, capillary     Status: Abnormal    Collection Time: 03/06/23  9:04 PM  Result Value Ref Range    Glucose-Capillary 244 (H) 70 - 99 mg/dL      Comment: Glucose reference range applies only to samples taken after fasting for at least 8 hours.  Glucose, capillary     Status: Abnormal    Collection Time: 03/07/23  7:22 AM  Result Value Ref Range    Glucose-Capillary 210 (H) 70 - 99 mg/dL      Comment: Glucose reference range applies only to samples taken after fasting for at least 8 hours.  Glucose, capillary     Status: Abnormal    Collection Time: 03/07/23 11:10 AM  Result Value Ref Range    Glucose-Capillary 278 (H) 70 - 99 mg/dL      Comment: Glucose reference range applies only to samples taken after fasting for at least 8 hours.  Glucose, capillary     Status: Abnormal    Collection Time: 03/07/23  4:50 PM  Result Value Ref Range    Glucose-Capillary 120 (H) 70 - 99 mg/dL      Comment: Glucose reference range applies only to samples taken after fasting for at least 8 hours.  Glucose, capillary     Status: Abnormal    Collection Time: 03/07/23  8:48 PM  Result Value Ref Range    Glucose-Capillary 131 (H) 70 - 99 mg/dL      Comment: Glucose reference range applies only to samples taken after fasting for at least 8 hours.  Glucose, capillary     Status: Abnormal     Collection Time: 03/08/23  8:09 AM  Result Value Ref Range    Glucose-Capillary 161 (H) 70 - 99 mg/dL      Comment: Glucose reference range applies only to samples taken after fasting for at least 8 hours.      Imaging Results (Last 48 hours)  No results found.         Blood pressure 137/74, pulse 84, temperature 98.4 F (36.9 C), temperature source Oral, resp. rate 16, height 5\' 10"  (1.778 m), weight 114.2 kg, SpO2 99%.   Medical Problem List and Plan: 1. 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  Antithrombotics: -DVT/anticoagulation:  Pharmaceutical: Lovenox added             -antiplatelet therapy:  DAPT X 3 weeks followed by ASA alone 3. Right sided headaches/Pain Management:  tylenol prn.  4. Mood/Behavior/Sleep: LCSW to follow for evaluation and support.              -antipsychotic agents: N/A 5. Neuropsych/cognition: This patient is capable of making decisions on his own behalf. 6. Skin/Wound Care: Routine pressure relief measures.  7. Fluids/Electrolytes/Nutrition: Monitor I/O. Check CMET in am.  8. HTN: Monitor  BP QID--continue apresoline, amlodipine and normodyne.              --was on amlodipine and hydrochlorothiazide PTA.             --supine HTN recorded and contributing to dizziness. Encourage fluid intake             --Lying SBP 162/103 w/P-78--> Sitting SBP 119/94 w/P-90. Orthostatic vitals ordered.             --Wears TEDs for chronic peripheral edema. Will order binder for use (question autonomic dysfunction)  9. T1DM: Hgb A1C-13.8 and poorly controlled due to lack of insulin pump/insurance issues.  --was on Lantus 40 units once a day w/counting carbs-BS running 300s for a while --Now on Levmir 20 units BID with 2 units ac TID  --Will monitor BS ac/hs and use SSI for elevated BS and titrate as indicated, 10. CKD: SCr- 1.94 (02/2022). Now BUN/SCr 48/3.31--->  30/3.07              --on Sodium bicarb TID for NAGMA-  11. Hyperlipidemia: LDL-130 (was 396 a year ago).  --Leqvio prior authorization started on acute.  12. Dizziness: Vestibular evaluation and treat. Denies dizziness, double vision or nausea.  13. Constipation: Being managed with Senna BID and Miralax 14. Anemia of chronic disease?: Hgb 12.5 @ admission. Recheck in am.  15. OSA: Has been using CPAP at nights.      Jacquelynn Cree, PA-C 03/08/2023  I have examined the patient independently and edited the note for HPI, ROS, exam, assessment, and plan as appropriate. I am in agreement with the above recommendations.   [name], DO 03/08/2023

## 2023-03-08 NOTE — TOC Transition Note (Signed)
Transition of Care [facility]) - CM/SW Discharge Note   Patient Details  Name: Bradley Davis MRN: [id number] Date of Birth: 1986-02-22  Transition of Care [facility]) CM/SW Contact:  Tom-Johnson, Hershal Coria, RN Phone Number: 03/08/2023, 10:41 AM   Clinical Narrative:     Patient is scheduled for discharge today to CIR. In-hospital transfer via bed.  No further TOC needs noted.        Final next level of care: IP Rehab Facility Barriers to Discharge: Barriers Resolved   Patient Goals and CMS Choice CMS Medicare.gov Compare Post Acute Care list provided to:: Patient Choice offered to / list presented to : Patient  Discharge Placement                  Patient to be transferred to facility by: In-Hospital transfer via bed      Discharge Plan and Services Additional resources added to the After Visit Summary for                  DME Arranged: N/A DME Agency: NA       HH Arranged: NA HH Agency: NA        Social Determinants of Health (SDOH) Interventions SDOH Screenings   Food Insecurity: No Food Insecurity (02/27/2023)  Housing: Low Risk  (02/27/2023)  Transportation Needs: No Transportation Needs (02/27/2023)  Utilities: Not At Risk (02/27/2023)  Depression (PHQ2-9): Low Risk  (06/06/2021)  Social Connections: Unknown (11/17/2021)   Received from [facility]  Tobacco Use: Low Risk  (02/27/2023)     Readmission Risk Interventions    02/28/2023    2:56 PM  Readmission Risk Prevention Plan  Post Dischage Appt Complete  Medication Screening Complete  Transportation Screening Complete

## 2023-03-09 DIAGNOSIS — E1022 Type 1 diabetes mellitus with diabetic chronic kidney disease: Secondary | ICD-10-CM

## 2023-03-09 DIAGNOSIS — K5901 Slow transit constipation: Secondary | ICD-10-CM | POA: Diagnosis not present

## 2023-03-09 DIAGNOSIS — I639 Cerebral infarction, unspecified: Secondary | ICD-10-CM | POA: Diagnosis not present

## 2023-03-09 DIAGNOSIS — I10 Essential (primary) hypertension: Secondary | ICD-10-CM | POA: Diagnosis not present

## 2023-03-09 LAB — CBC WITH DIFFERENTIAL/PLATELET
Abs Immature Granulocytes: 0.01 10*3/uL (ref 0.00–0.07)
Basophils Absolute: 0 10*3/uL (ref 0.0–0.1)
Basophils Relative: 1 %
Eosinophils Absolute: 0.3 10*3/uL (ref 0.0–0.5)
Eosinophils Relative: 5 %
HCT: 39.1 % (ref 39.0–52.0)
Hemoglobin: 12.8 g/dL — ABNORMAL LOW (ref 13.0–17.0)
Immature Granulocytes: 0 %
Lymphocytes Relative: 53 %
Lymphs Abs: 3.2 10*3/uL (ref 0.7–4.0)
MCH: 27.5 pg (ref 26.0–34.0)
MCHC: 32.7 g/dL (ref 30.0–36.0)
MCV: 84.1 fL (ref 80.0–100.0)
Monocytes Absolute: 0.5 10*3/uL (ref 0.1–1.0)
Monocytes Relative: 8 %
Neutro Abs: 2 10*3/uL (ref 1.7–7.7)
Neutrophils Relative %: 33 %
Platelets: 343 10*3/uL (ref 150–400)
RBC: 4.65 MIL/uL (ref 4.22–5.81)
RDW: 13 % (ref 11.5–15.5)
WBC: 6 10*3/uL (ref 4.0–10.5)
nRBC: 0 % (ref 0.0–0.2)

## 2023-03-09 LAB — COMPREHENSIVE METABOLIC PANEL
ALT: 28 U/L (ref 0–44)
AST: 34 U/L (ref 15–41)
Albumin: 2.5 g/dL — ABNORMAL LOW (ref 3.5–5.0)
Alkaline Phosphatase: 61 U/L (ref 38–126)
Anion gap: 7 (ref 5–15)
BUN: 26 mg/dL — ABNORMAL HIGH (ref 6–20)
CO2: 23 mmol/L (ref 22–32)
Calcium: 8.6 mg/dL — ABNORMAL LOW (ref 8.9–10.3)
Chloride: 108 mmol/L (ref 98–111)
Creatinine, Ser: 3.37 mg/dL — ABNORMAL HIGH (ref 0.61–1.24)
GFR, Estimated: 23 mL/min — ABNORMAL LOW (ref >60)
Glucose, Bld: 127 mg/dL — ABNORMAL HIGH (ref 70–99)
Potassium: 3.8 mmol/L (ref 3.5–5.1)
Sodium: 138 mmol/L (ref 135–145)
Total Bilirubin: 0.8 mg/dL (ref 0.3–1.2)
Total Protein: 5.6 g/dL — ABNORMAL LOW (ref 6.5–8.1)

## 2023-03-09 LAB — GLUCOSE, CAPILLARY
Glucose-Capillary: 121 mg/dL — ABNORMAL HIGH (ref 70–99)
Glucose-Capillary: 90 mg/dL (ref 70–99)
Glucose-Capillary: 130 mg/dL — ABNORMAL HIGH (ref 70–99)
Glucose-Capillary: 113 mg/dL — ABNORMAL HIGH (ref 70–99)

## 2023-03-09 NOTE — Evaluation (Signed)
Occupational Therapy Assessment and Plan  Patient Details  Name: Bradley Davis MRN: [id number] Date of Birth: 18-Oct-1985  OT Diagnosis: ataxia, disturbance of vision, and muscle weakness (generalized) Rehab Potential: Rehab Potential (ACUTE ONLY): Excellent ELOS: 7-10 days   Today's Date: 03/09/2023 OT Individual Time: 1245-1345 OT Individual Time Calculation (min): 60 min     Hospital Problem: Principal Problem:   Cerebellar stroke [facility])   Past Medical History:  Past Medical History:  Diagnosis Date   CKD stage 3a, GFR 45-59 ml/min (HCC)    Diabetes mellitus    Hypertension    Necrotizing fasciitis (HCC) 04/2021   right groin   Past Surgical History:  Past Surgical History:  Procedure Laterality Date   INCISION AND DRAINAGE ABSCESS Right 04/21/2021   Procedure: INCISION, DRAINAGE, DEBRIDEMENT OF RIGHT THIGH/GROIN INFECTION;  Surgeon: Abigail Miyamoto, MD;  Location: [facility];  Service: General;  Laterality: Right;   IRRIGATION AND DEBRIDEMENT ABSCESS Right 04/23/2021   Procedure: IRRIGATION AND DEBRIDEMENT RIGHT GROIN WOUND; RIGHT THIGH DRESSING CHANGE;  Surgeon: Abigail Miyamoto, MD;  Location: [facility];  Service: General;  Laterality: Right;   IRRIGATION AND DEBRIDEMENT ABSCESS Right 04/26/2021   Procedure: reexploration and debridement of soft tissue infection right groin;  Surgeon: Emelia Loron, MD;  Location: [facility];  Service: General;  Laterality: Right;   RETINAL DETACHMENT SURGERY  2022   TONSILLECTOMY  2003    Assessment & Plan Clinical Impression:  Bradley Davis is a 37 year old left handed male with history of T1DM, CKD 3a, HTN who was admitted on 02/27/23 with 2 day history of N/V with inability to keep down food or meds progressing to dizziness with standing attempts. He was found to have acute on chronic renal failure with SCR up to 3.6 and treated with  2 L fluid bolus and started on IVF for hydration. KUB done showed small to moderate stool burden and  he was started on bowel regimen with good results. Therapy initiated and he was noted to have loss of balance to the right with reports of decreased sensation on RLE, unstable and staggering gait as well as orthostatic changes.  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    Dr. Derry Lory consulted for input and MRA  brain ordered which was WNL. 2 D echo with bubble study revealed EF 55-60% with no evidence of shunt. Renal ultrasound showed normal kidneys.  Dr. Pearlean Brownie felt that stroke was due to small vessel disease. Patient also felt to be high risk for sleep apnea and qualified for Sleep Smart study based on overnight testing. Right facial numbness improved, denies dizziness or vision changes and continues to have persistent HA. Patient transferred to CIR on 03/08/2023 .    Patient currently requires min with basic self-care skills and IADL secondary to muscle weakness, decreased cardiorespiratoy endurance, ataxia and decreased coordination, central origin, and decreased standing balance.  Prior to hospitalization, patient could complete self-care independently.  Patient will benefit from skilled intervention to decrease level of assist with basic self-care skills, increase independence with basic self-care skills, and increase level of independence with iADL prior to discharge home independently.  Anticipate patient will require intermittent supervision and follow up outpatient.  OT - End of Session Activity Tolerance: Tolerates 10 - 20 min activity with multiple rests Endurance Deficit: Yes Endurance Deficit Description: rest breaks needed during shower OT Assessment Rehab Potential (ACUTE  ONLY): Excellent OT Barriers to Discharge: Lack of/limited family support OT Patient demonstrates impairments in the following area(s): Balance;Endurance;Motor OT Basic ADL's Functional  Problem(s): Bathing;Dressing;Toileting OT Advanced ADL's Functional Problem(s): Simple Meal Preparation OT Transfers Functional Problem(s): Toilet;Tub/Shower OT Additional Impairment(s): None OT Plan OT Intensity: Minimum of 1-2 x/day, 45 to 90 minutes OT Frequency: 5 out of 7 days OT Duration/Estimated Length of Stay: 7-10 days OT Treatment/Interventions: Balance/vestibular training;Discharge planning;Pain management;Self Care/advanced ADL retraining;Therapeutic Activities;UE/LE Coordination activities;Disease mangement/prevention;Functional mobility training;Patient/family education;Therapeutic Exercise;Community reintegration;DME/adaptive equipment instruction;Neuromuscular re-education;Psychosocial support;UE/LE Strength taining/ROM OT Self Feeding Anticipated Outcome(s): Independent OT Basic Self-Care Anticipated Outcome(s): Mod I OT Toileting Anticipated Outcome(s): Mod I OT Bathroom Transfers Anticipated Outcome(s): Mod I OT Recommendation Recommendations for Other Services: Neuropsych consult;Vestibular eval;Therapeutic Recreation consult Therapeutic Recreation Interventions: Pet therapy;Kitchen group;Stress management Patient destination: Home Follow Up Recommendations: Outpatient OT Equipment Recommended: To be determined   OT Evaluation Precautions/Restrictions  Precautions Precautions: Fall Precaution Comments: orthostatic hypotension, significant bias to R and mild to mod posterior, orders for abd binder when standing Restrictions Weight Bearing Restrictions: No Home Living/Prior Functioning Home Living Living Arrangements: Alone Available Help at Discharge: Available 24 hours/day, Family, Friend(s) Type of Home: Apartment Home Access: Level entry Home Layout: One level Bathroom Shower/Tub: Tub/shower unit, Buyer, retail: Yes Additional Comments: No DME  Lives With: Alone IADL History Homemaking Responsibilities:  Yes Meal Prep Responsibility: Primary Current License: Yes Mode of Transportation: Car Occupation: Full time employment Type of Occupation: Owns a Physicist, medical and works for a [employer] Leisure and Hobbies: Hydrographic surveyor, ride motorcycle Prior Function Level of Independence: Independent with basic ADLs, Independent with homemaking with ambulation, Independent with transfers, Independent with gait  Able to Take Stairs?: Reciprically Driving: Yes Vocation: Full time employment (works for [employer] and 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, 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) Vision Baseline Vision/History: 1 Wears glasses (glasses for clearer vision at any distance, has had L retinal surgery) Ability to See in Adequate Light: 0 Adequate Patient Visual Report: No change from baseline Vision Assessment?: Vision impaired- to be further tested in functional context Eye Alignment: Within Functional Limits Alignment/Gaze Preference: Within Defined Limits Tracking/Visual Pursuits: Able to track stimulus in all quads without difficulty Saccades: Within functional limits Convergence: Impaired (comment) (R eye tracks object to close distance, L eye stops at midline) Visual Fields: No apparent deficits Depth Perception: Overshoots Additional Comments: glasses are for "clearer vision at any distance", has had L retinal surgery Perception  Perception: Within Functional Limits Praxis Praxis: WFL Cognition Cognition Overall Cognitive Status: Within Functional Limits for tasks assessed Arousal/Alertness: Awake/alert Orientation Level: Person;Place;Situation Person: Oriented Place: Oriented Situation: Oriented Memory: Appears intact Attention: Focused;Sustained Focused Attention: Appears intact Sustained Attention: Appears intact Awareness: Appears intact Problem Solving: Appears intact Safety/Judgment: Appears intact Brief Interview for Mental Status (BIMS) Repetition of Three  Words (First Attempt): 3 Temporal Orientation: Year: Correct Temporal Orientation: Month: Accurate within 5 days Temporal Orientation: Day: Correct Recall: "Sock": Yes, no cue required Recall: "Blue": Yes, no cue required Recall: "Bed": Yes, no cue required BIMS Summary Score: 15 Sensation Sensation Light Touch: Appears Intact Proprioception: Impaired by gross assessment Coordination Gross Motor Movements are Fluid and Coordinated: No Fine Motor Movements are Fluid and Coordinated: Yes Motor  Motor Motor: Within Functional Limits Motor - Skilled Clinical Observations: midline orientation is biased to R side and slightly posteriorly, otherwise intact motor  Trunk/Postural Assessment  Cervical Assessment Cervical Assessment: Exceptions to [facility] (forward head)  Thoracic Assessment Thoracic Assessment: Exceptions to [facility] (rounded shoulders) Lumbar Assessment Lumbar Assessment: Within Functional Limits Postural Control Postural Control: Deficits on evaluation Righting Reactions: delayed Protective Responses: cognitive prep for RLE stance  Balance Balance Balance Assessed: Yes Standardized Balance Assessment Standardized Balance Assessment: Functional Gait Assessment Static Sitting Balance Static Sitting - Balance Support: Feet supported;No upper extremity supported Static Sitting - Level of Assistance: 7: Independent Dynamic Sitting Balance Dynamic Sitting - Balance Support: Feet supported;During functional activity Dynamic Sitting - Level of Assistance: 5: Stand by assistance Dynamic Sitting - Balance Activities: Forward lean/weight shifting;Reaching for objects;Reaching for weighted objects;Reaching across midline Static Standing Balance Static Standing - Balance Support: No upper extremity supported;During functional activity Static Standing - Level of Assistance: 5: Stand by assistance Dynamic Standing Balance Dynamic Standing - Balance Support: During functional activity;No  upper extremity supported Dynamic Standing - Level of Assistance: 4: Min assist Dynamic Standing - Balance Activities: Lateral lean/weight shifting;Forward lean/weight shifting;Reaching for objects;Reaching for weighted objects;Reaching across midline;Compliant surfaces Functional Gait  Assessment Gait assessed : Yes Gait Level Surface: Walks 20 ft, slow speed, abnormal gait pattern, evidence for imbalance or deviates 10-15 in outside of the 12 in walkway width. Requires more than 7 sec to ambulate 20 ft. Change in Gait Speed: Makes only minor adjustments to walking speed, or accomplishes a change in speed with significant gait deviations, deviates 10-15 in outside the 12 in walkway width, or changes speed but loses balance but is able to recover and continue walking. Gait with Horizontal Head Turns: Performs head turns with moderate changes in gait velocity, slows down, deviates 10-15 in outside 12 in walkway width but recovers, can continue to walk. (R head turn with more path deviation than L head turn.) Gait with Vertical Head Turns: Performs task with moderate change in gait velocity, slows down, deviates 10-15 in outside 12 in walkway width but recovers, can continue to walk. (Down = WFL; Upward = significant deviation from path) Gait and Pivot Turn: Turns slowly, requires verbal cueing, or requires several small steps to catch balance following turn and stop Gait with Eyes Closed: Walks 20 ft, uses assistive device, slower speed, mild gait deviations, deviates 6-10 in outside 12 in walkway width. Ambulates 20 ft in less than 9 sec but greater than 7 sec. Ambulating Backwards: Walks 20 ft, slow speed, abnormal gait pattern, evidence for imbalance, deviates 10-15 in outside 12 in walkway width. Extremity/Trunk Assessment RUE Assessment RUE Assessment: Within Functional Limits LUE Assessment LUE Assessment: Within Functional Limits  Care Tool Care Tool Self Care Eating   Eating Assist Level:  Independent    Oral Care    Oral Care Assist Level: Independent    Bathing   Body parts bathed by patient: Right arm;Left arm;Chest;Abdomen;Front perineal area;Buttocks;Right upper leg;Left upper leg;Right lower leg;Left lower leg;Face     Assist Level: Supervision/Verbal cueing    Upper Body Dressing(including orthotics)   What is the patient wearing?: Pull over shirt   Assist Level: Independent    Lower Body Dressing (excluding footwear)   What is the patient wearing?: Underwear/pull up;Pants Assist for lower body dressing: Supervision/Verbal cueing    Putting on/Taking off footwear   What is the patient wearing?: Non-skid slipper socks Assist for footwear: Set up assist       Care Tool Toileting Toileting activity   Assist for toileting: Supervision/Verbal cueing     Care Tool Bed Mobility Roll left and right activity   Roll left and right assist level: Independent  Sit to lying activity   Sit to lying assist level: Independent with assistive device    Lying to sitting on side of bed activity   Lying to sitting on side of bed assist level: the ability to move from lying on the back to sitting on the side of the bed with no back support.: Supervision/Verbal cueing     Care Tool Transfers Sit to stand transfer   Sit to stand assist level: Contact Guard/Touching assist    Chair/bed transfer   Chair/bed transfer assist level: Contact Guard/Touching assist     Toilet transfer   Assist Level: Contact Guard/Touching assist     Care Tool Cognition  Expression of Ideas and Wants Expression of Ideas and Wants: 4. Without difficulty (complex and basic) - expresses complex messages without difficulty and with speech that is clear and easy to understand  Understanding Verbal and Non-Verbal Content Understanding Verbal and Non-Verbal Content: 4. Understands (complex and basic) - clear comprehension without cues or repetitions   Memory/Recall Ability Memory/Recall  Ability : Current season;That he or she is in a hospital/hospital unit;Staff names and faces   Refer to Care Plan for Long Term Goals  SHORT TERM GOAL WEEK 1 OT Short Term Goal 1 (Week 1): STG = LTG 2/2 ELOS  Recommendations for other services: Neuropsych and Therapeutic Recreation  Pet therapy, Kitchen group, and Stress management   Skilled Therapeutic Intervention Patient received semi upright in bed upon therapy arrival and agreeable to participate in OT evaluation. Education provided on OT purpose, therapy schedule, goals for therapy, and safety policy while in rehab. No pain reported, with resolved nausea from earlier in day.   Patient demonstrates GMC coordination, dynamic balance, endurance and proprioceptive deficits resulting in difficulty completing BADL tasks without increased physical assist. Pt will benefit from skilled OT services to focus on mentioned deficits. See below for ADL at the shower level and functional ambulation performance.   Orthostatics assessed (see below). Education provided on visual fixation to reduce dizziness with bed mobility. Education also provided on ways to manage OH, however pt with great tolerance with mobility despite dropping with positional changes (specifically standing). Educated on when to wear TEDs, avoiding wearing lying down 2/2 hypertension supine. Did lose balance primarly > Rt side, but able to correct with CGA from therapist. No AD used for mobility however pt noted to externally seeking support from walls etc. Pt remained seated in recliner at conclusion of session with chair alarm on and all needs met at end of session.  Vitals BP 158/88 (semi supine with knee high TEDs already on) BP 148/91 (sitting EOB), asymptomatic  BP 125/70 (standing), asymptomatic BP 140/89 (sitting after activity without TEDs); asymptomatic   ADL ADL Eating: Independent Where Assessed-Eating: Chair Grooming: Independent Where Assessed-Grooming: Chair Upper  Body Bathing: Independent Where Assessed-Upper Body Bathing: Shower Lower Body Bathing: Supervision/safety Where Assessed-Lower Body Bathing: Shower Upper Body Dressing: Independent Where Assessed-Upper Body Dressing: Other (Comment) (TTB) Lower Body Dressing: Supervision/safety Where Assessed-Lower Body Dressing: Other (Comment) (TTB) Toileting: Supervision/safety Where Assessed-Toileting: Toilet (in stance) Toilet Transfer: Furniture conservator/restorer Method: Proofreader: Engineer, technical sales: Unable to assess Tub/Shower Transfer Method: Unable to assess Film/video editor: Administrator, arts Method: Designer, industrial/product: Transfer tub bench;Grab bars Mobility  Bed Mobility Bed Mobility: Rolling Right;Rolling Left;Supine to Sit;Sit to Supine Rolling Right: Independent with assistive device Rolling Left: Independent with assistive device Supine to Sit: Supervision/Verbal cueing Sit to Supine: Independent  with assistive device Transfers Sit to Stand: Contact Guard/Touching assist Stand to Sit: Supervision/Verbal cueing   Discharge Criteria: Patient will be discharged from OT if patient refuses treatment 3 consecutive times without medical reason, if treatment goals not met, if there is a change in medical status, if patient makes no progress towards goals or if patient is discharged from hospital.  The above assessment, treatment plan, treatment alternatives and goals were discussed and mutually agreed upon: by patient  Melvyn Novas, MS, OTR/L  03/09/2023, 2:02 PM

## 2023-03-09 NOTE — Progress Notes (Signed)
   03/09/23 2307  BiPAP/CPAP/SIPAP  BiPAP/CPAP/SIPAP Pt Type Adult  BiPAP/CPAP/SIPAP Resmed  Mask Type Nasal pillows  Patient Home Equipment Yes  Auto Titrate No  Safety Check Completed by RT for Home Unit Yes, no issues noted

## 2023-03-09 NOTE — Progress Notes (Signed)
Pt educated on importance of fluid intake and kidney function. Encouraged fluid intake. Pt in agreement. Fluids provided Mylo Red, LPN

## 2023-03-09 NOTE — Plan of Care (Signed)
  Problem: RH Balance Goal: LTG Patient will maintain dynamic standing with ADLs (OT) Description: LTG:  Patient will maintain dynamic standing balance with assist during activities of daily living (OT)  Flowsheets (Taken 03/09/2023 1404) LTG: Pt will maintain dynamic standing balance during ADLs with: Independent with assistive device   Problem: Sit to Stand Goal: LTG:  Patient will perform sit to stand in prep for activites of daily living with assistance level (OT) Description: LTG:  Patient will perform sit to stand in prep for activites of daily living with assistance level (OT) Flowsheets (Taken 03/09/2023 1404) LTG: PT will perform sit to stand in prep for activites of daily living with assistance level: Independent with assistive device   Problem: RH Bathing Goal: LTG Patient will bathe all body parts with assist levels (OT) Description: LTG: Patient will bathe all body parts with assist levels (OT) Flowsheets (Taken 03/09/2023 1404) LTG: Pt will perform bathing with assistance level/cueing: Independent with assistive device    Problem: RH Dressing Goal: LTG Patient will perform lower body dressing w/assist (OT) Description: LTG: Patient will perform lower body dressing with assist, with/without cues in positioning using equipment (OT) Flowsheets (Taken 03/09/2023 1404) LTG: Pt will perform lower body dressing with assistance level of: Independent with assistive device   Problem: RH Toileting Goal: LTG Patient will perform toileting task (3/3 steps) with assistance level (OT) Description: LTG: Patient will perform toileting task (3/3 steps) with assistance level (OT)  Flowsheets (Taken 03/09/2023 1404) LTG: Pt will perform toileting task (3/3 steps) with assistance level: Independent with assistive device   Problem: RH Simple Meal Prep Goal: LTG Patient will perform simple meal prep w/assist (OT) Description: LTG: Patient will perform simple meal prep with assistance, with/without  cues (OT). Flowsheets (Taken 03/09/2023 1404) LTG: Pt will perform simple meal prep with assistance level of: Supervision/Verbal cueing   Problem: RH Toilet Transfers Goal: LTG Patient will perform toilet transfers w/assist (OT) Description: LTG: Patient will perform toilet transfers with assist, with/without cues using equipment (OT) Flowsheets (Taken 03/09/2023 1404) LTG: Pt will perform toilet transfers with assistance level of: Independent with assistive device   Problem: RH Tub/Shower Transfers Goal: LTG Patient will perform tub/shower transfers w/assist (OT) Description: LTG: Patient will perform tub/shower transfers with assist, with/without cues using equipment (OT) Flowsheets (Taken 03/09/2023 1404) LTG: Pt will perform tub/shower stall transfers with assistance level of: Supervision/Verbal cueing

## 2023-03-09 NOTE — Evaluation (Signed)
Physical Therapy Assessment and Plan  Patient Details  Name: Bradley Davis MRN: [id number] Date of Birth: 06/30/1986  PT Diagnosis: Coordination disorder, Difficulty walking, and Dizziness and giddiness Rehab Potential: Good ELOS: 10-14 days   Today's Date: 03/09/2023 PT Individual Time: 0801-0917 PT Individual Time Calculation (min): 76 min    Hospital Problem: Principal Problem:   Cerebellar stroke [facility])   Past Medical History:  Past Medical History:  Diagnosis Date   CKD stage 3a, GFR 45-59 ml/min (HCC)    Diabetes mellitus    Hypertension    Necrotizing fasciitis (HCC) 04/2021   right groin   Past Surgical History:  Past Surgical History:  Procedure Laterality Date   INCISION AND DRAINAGE ABSCESS Right 04/21/2021   Procedure: INCISION, DRAINAGE, DEBRIDEMENT OF RIGHT THIGH/GROIN INFECTION;  Surgeon: Abigail Miyamoto, MD;  Location: [facility];  Service: General;  Laterality: Right;   IRRIGATION AND DEBRIDEMENT ABSCESS Right 04/23/2021   Procedure: IRRIGATION AND DEBRIDEMENT RIGHT GROIN WOUND; RIGHT THIGH DRESSING CHANGE;  Surgeon: Abigail Miyamoto, MD;  Location: [facility];  Service: General;  Laterality: Right;   IRRIGATION AND DEBRIDEMENT ABSCESS Right 04/26/2021   Procedure: reexploration and debridement of soft tissue infection right groin;  Surgeon: Emelia Loron, MD;  Location: [facility];  Service: General;  Laterality: Right;   RETINAL DETACHMENT SURGERY  2022   TONSILLECTOMY  2003    Assessment & Plan Clinical Impression: Patient is a 37 y.o. left handed male with history of T1DM, CKD 3a, HTN who was admitted on 02/27/23 with 2 day history of N/V with inability to keep down food or meds progressing to dizziness with standing attempts. He was found to have acute on chronic renal failure with SCR up to 3.6 and treated with  2 L fluid bolus and started on IVF for hydration. KUB done showed small to moderate stool burden and he was started on bowel regimen with good  results. Therapy initiated and he was noted to have loss of balance to the right with reports of decreased sensation on RLE, unstable and staggering gait as well as orthostatic changes.  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    Dr. Derry Lory consulted for input and MRA  brain ordered which was WNL. 2 D echo with bubble study revealed EF 55-60% with no evidence of shunt. Renal ultrasound showed normal kidneys.  Dr. Pearlean Brownie felt that stroke was due to small vessel disease. Patient also felt to be high risk for sleep apnea and qualified for Sleep Smart study based on overnight testing. Right facial numbness improved, denies dizziness or vision changes and continues to have persistent HA.  Patient transferred to CIR on 03/08/2023 .   Patient currently requires min assist with mobility secondary to impaired timing and sequencing, unbalanced muscle activation, decreased coordination, and decreased motor planning, decreased midline orientation, delayed processing, central origin, and decreased standing balance and decreased balance strategies.  Prior to hospitalization, patient was independent  with mobility and lived with Alone in a Apartment home.  Home access is  Level entry.  Patient will benefit from skilled PT intervention to maximize safe functional mobility, minimize fall risk, and decrease caregiver burden for planned discharge home with 24 hour supervision.  Anticipate patient will benefit from follow up OP at discharge.  PT - End of Session Activity Tolerance: Tolerates 30+ min activity without fatigue Endurance Deficit: No PT Assessment Rehab Potential (ACUTE/IP  ONLY): Good PT Barriers to Discharge: Decreased caregiver support;Insurance for SNF coverage;Weight;Medication compliance PT Patient demonstrates impairments in the following area(s): Balance;Motor;Perception PT  Transfers Functional Problem(s): Bed to Chair;Car;Furniture;Floor PT Locomotion Functional Problem(s): Ambulation;Stairs PT Plan PT Intensity: Minimum of 1-2 x/day ,45 to 90 minutes PT Frequency: 5 out of 7 days PT Duration Estimated Length of Stay: 10-14 days PT Treatment/Interventions: Ambulation/gait training;Community reintegration;DME/adaptive equipment instruction;Neuromuscular re-education;Psychosocial support;Stair training;UE/LE Strength taining/ROM;Balance/vestibular training;Discharge planning;Skin care/wound management;Therapeutic Activities;UE/LE Coordination activities;Cognitive remediation/compensation;Disease management/prevention;Functional mobility training;Patient/family education;Therapeutic Exercise PT Transfers Anticipated Outcome(s): supervision/ Mod I PT Locomotion Anticipated Outcome(s): supervision/ Mod I PT Recommendation Recommendations for Other Services: Vestibular eval;Therapeutic Recreation consult Therapeutic Recreation Interventions: Kitchen group;Stress management;Outing/community reintergration Follow Up Recommendations: Outpatient PT;24 hour supervision/assistance Patient destination: Home Equipment Recommended: To be determined   PT Evaluation Precautions/Restrictions Precautions Precautions: Fall Precaution Comments: orthostatic hypotension, significant bias to R and mild to mod posterior, orders for abd binder when standing Restrictions Weight Bearing Restrictions: No General   Vital Signs  Pain Pain Assessment Pain Scale: 0-10 Pain Score: 0-No pain Pain Interference Pain Interference Pain Effect on Sleep: 1. Rarely or not at all Pain Interference with Therapy Activities: 1. Rarely or not at all Pain Interference with Day-to-Day Activities: 1. 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, 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) Vision/Perception  Vision - History Ability to See in Adequate Light: 0 Adequate Vision - Assessment Eye Alignment: Within Functional Limits Alignment/Gaze Preference: Within Defined Limits Tracking/Visual Pursuits: Able to track stimulus in all quads without difficulty Saccades: Within functional limits Convergence: Impaired (comment) (R eye tracks object to close distance, L eye stops at midline) Additional Comments: glasses are for "clearer vision at any distance", has had L retinal surgery Perception Perception: Within Functional Limits Praxis Praxis: WFL  Cognition Overall Cognitive Status: Within Functional Limits for tasks assessed Arousal/Alertness: Awake/alert Orientation Level: Oriented X4 Attention: Focused;Sustained Focused Attention: Appears intact Sustained Attention: Appears intact Memory: Appears intact Awareness: Appears intact Problem Solving: Appears intact Safety/Judgment: Appears intact Sensation Sensation Light Touch: Appears Intact Proprioception: Impaired by gross assessment Coordination Gross Motor Movements are Fluid and Coordinated: No Fine Motor Movements are Fluid and Coordinated: Yes Heel Shin Test: [facility] Motor  Motor Motor: Within Functional Limits Motor - Skilled Clinical Observations: midline orientation is biased to R side and slightly posteriorly, otherwise intact motor   Trunk/Postural Assessment  Cervical Assessment Cervical Assessment: Exceptions to [facility] (forward  head) Thoracic Assessment Thoracic Assessment: Exceptions to [facility] (resting rounded shoulders) Lumbar Assessment Lumbar Assessment: Within Functional Limits Postural Control Postural Control: Deficits on evaluation Righting Reactions: delayed Protective Responses: cognitive prep for RLE stance  Balance Balance Balance Assessed: Yes Standardized Balance Assessment Standardized Balance Assessment: Functional Gait Assessment Static Sitting Balance Static Sitting - Balance Support: Feet supported;No upper extremity supported Static Sitting - Level of Assistance: 7: Independent Dynamic Sitting Balance Dynamic Sitting - Balance Support: Feet supported;During functional activity Dynamic Sitting - Level of Assistance: 5: Stand by assistance Dynamic Sitting - Balance Activities: Forward lean/weight shifting;Reaching for objects;Reaching for weighted  objects;Reaching across midline Static Standing Balance Static Standing - Balance Support: No upper extremity supported;During functional activity Static Standing - Level of Assistance: 5: Stand by assistance Dynamic Standing Balance Dynamic Standing - Balance Support: During functional activity;No upper extremity supported Dynamic Standing - Level of Assistance: 4: Min assist Dynamic Standing - Balance Activities: Lateral lean/weight shifting;Forward lean/weight shifting;Reaching for objects;Reaching for weighted objects;Reaching across midline;Compliant surfaces Functional Gait  Assessment Gait assessed : Yes Gait Level Surface: Walks 20 ft, slow speed, abnormal gait pattern, evidence for imbalance or deviates 10-15 in outside of the 12 in walkway width. Requires more than 7 sec to ambulate 20 ft. Change in Gait Speed: Makes only minor adjustments to walking speed, or accomplishes a change in speed with significant gait deviations, deviates 10-15 in outside the 12 in walkway width, or changes speed but loses balance but is able to recover and  continue walking. Gait with Horizontal Head Turns: Performs head turns with moderate changes in gait velocity, slows down, deviates 10-15 in outside 12 in walkway width but recovers, can continue to walk. (R head turn with more path deviation than L head turn.) Gait with Vertical Head Turns: Performs task with moderate change in gait velocity, slows down, deviates 10-15 in outside 12 in walkway width but recovers, can continue to walk. (Down = WFL; Upward = significant deviation from path) Gait and Pivot Turn: Turns slowly, requires verbal cueing, or requires several small steps to catch balance following turn and stop Gait with Eyes Closed: Walks 20 ft, uses assistive device, slower speed, mild gait deviations, deviates 6-10 in outside 12 in walkway width. Ambulates 20 ft in less than 9 sec but greater than 7 sec. Ambulating Backwards: Walks 20 ft, slow speed, abnormal gait pattern, evidence for imbalance, deviates 10-15 in outside 12 in walkway width. Extremity Assessment      RLE Assessment RLE Assessment: Within Functional Limits LLE Assessment LLE Assessment: Within Functional Limits  Care Tool Care Tool Bed Mobility Roll left and right activity   Roll left and right assist level: Independent    Sit to lying activity   Sit to lying assist level: Independent with assistive device    Lying to sitting on side of bed activity   Lying to sitting on side of bed assist level: the ability to move from lying on the back to sitting on the side of the bed with no back support.: Supervision/Verbal cueing     Care Tool Transfers Sit to stand transfer   Sit to stand assist level: Contact Guard/Touching assist    Chair/bed transfer   Chair/bed transfer assist level: Contact Guard/Touching assist     Toilet transfer   Assist Level: Contact Guard/Touching assist    Car transfer   Car transfer assist level: Contact Guard/Touching assist      Care Tool Locomotion Ambulation   Assist  level: Minimal Assistance - Patient > 75% Assistive device: No Device Max distance: 200 ft  Walk 10 feet activity   Assist level: Supervision/Verbal cueing Assistive device: No Device   Walk 50 feet with 2 turns activity   Assist level: Minimal Assistance - Patient > 75% Assistive device: No Device  Walk 150 feet activity   Assist level: Minimal Assistance - Patient > 75% Assistive device: No Device  Walk 10 feet on uneven surfaces activity Walk 10 feet on uneven surfaces activity did not occur: Safety/medical concerns      Stairs Stair activity did not occur: Safety/medical concerns  Walk up/down 1 step activity Walk up/down 1 step or curb (drop down) activity did not occur: Safety/medical concerns      Walk up/down 4 steps activity Walk up/down 4 steps activity did not occur: Safety/medical concerns      Walk up/down 12 steps activity Walk up/down 12 steps activity did not occur: Safety/medical concerns      Pick up small objects from floor   Pick up small object from the floor assist level: Contact Guard/Touching assist Pick up small object from the floor assistive device: RUE on solid surface  Wheelchair Is the patient using a wheelchair?: No   Wheelchair activity did not occur: N/A      Wheel 50 feet with 2 turns activity Wheelchair 50 feet with 2 turns activity did not occur: N/A    Wheel 150 feet activity Wheelchair 150 feet activity did not occur: N/A      Refer to Care Plan for Long Term Goals  SHORT TERM GOAL WEEK 1 PT Short Term Goal 1 (Week 1): Pt will perform rise to stand with overall supervision and decreased apprehension and decreased R lateral bias. PT Short Term Goal 2 (Week 1): Pt will ambulate >271ft with <50% LOB to R requiring no more than CGA to maintain. PT Short Term Goal 3 (Week 1): Pt will perform stair negotiation with no UE support and overall supervision/ light CGA. PT Short Term Goal 4 (Week 1): Pt will relate decrease in R sided  bias with improved midline orientation.  Recommendations for other services: Adult nurse group, Stress management, and Outing/community reintegration and Other: Vestibular Evaluation  Skilled Therapeutic Intervention Mobility Bed Mobility Bed Mobility: Rolling Right;Rolling Left;Supine to Sit;Sit to Supine Rolling Right: Independent with assistive device Rolling Left: Independent with assistive device Supine to Sit: Supervision/Verbal cueing (potential vestibular impairment in balance in rising to sit) Sit to Supine: Independent with assistive device Transfers Transfers: Sit to Stand;Stand to Sit;Stand Pivot Transfers Sit to Stand: Supervision/Verbal cueing;Contact Guard/Touching assist Stand to Sit: Supervision/Verbal cueing Stand Pivot Transfers: Contact Guard/Touching assist Transfer (Assistive device): None Locomotion  Gait Ambulation: Yes Gait Assistance: Contact Guard/Touching assist;Minimal Assistance - Patient > 75% Gait Distance (Feet): 200 Feet Assistive device: None Gait Assistance Details: Verbal cues for technique;Verbal cues for precautions/safety Gait Assistance Details: VC provided mainly for pt's shift of midline orientation to R of center and slightly posterior Gait Gait: Yes Gait Pattern: Decreased stance time - right;Decreased hip/knee flexion - right;Right foot flat;Wide base of support (significant midline bias to R with pt self preparing  during ambulation for RLE stance phase with attempt to decrease time on RLE and increasing quad/ glute activation for stiff progression thru stance phase.) Gait velocity: reduced Stairs / Additional Locomotion Stairs: Yes Wheelchair Mobility Wheelchair Mobility: No  Skilled intervention: PT Evaluation completed; see above for results. PT educated patient in roles of PT vs OT, PT POC, rehab potential, rehab goals, and discharge recommendations along with recommendation for follow-up rehabilitation  services. Individual treatment initiated:  Patient supine in bed upon PT arrival. Patient alert and agreeable to PT session. No pain complaint during session.  Therapeutic Activity: Bed Mobility: Patient performed supine > sit with no dizziness symptoms and Mod I/ supervision for use of bed rails and extra time. No vc for technique. At end of session, able to return to supine with Mod I and no symptoms.  Transfers: Patient performed sit <> stand transfers throughout session with supervision/ CGA for R sided bias to standing  balance. Provided vc/tc for actual midline orientation.  Gait Training:  Patient ambulated ~130 feet using no AD with CGA/ MinA for consistent instances of R sided balance lean and a few instances of LOB requiring stepping strategy/ MinA to correct. Self chooses to increase muscle activation in quad/ glute in order to attempt to overcome increased lateral shift to R side. Provided vc/tc for upright balance and maintaining midline.   Neuromuscular Re-ed: NMR facilitated during session with focus on standing balance. Pt guided in standing balance with self perturbations on floor as well as on pliant surface. Arm movements in both conditions with similar results. No LOB but extra activation noted in RLE. Pt demos difficulty in stepping up onto pliant surface. Difficulty with single leg stance on RLE as well as bringing RLE to Airex pad.   NMR performed for improvements in motor control and coordination, balance, sequencing, judgement, and self confidence/ efficacy in performing all aspects of mobility at highest level of independence.   On return ambulation to room, pt guided in aspects of FGA with difficulty in balance with upward and L/ R head turns, turning 180 degrees, and high knee stepping.   Set to work with patient again in an hour and plan to complete FGA as well as Programmer, systems.   Patient supine in bed at end of session with brakes locked, bed alarm set, and all needs  within reach.    Discharge Criteria: Patient will be discharged from PT if patient refuses treatment 3 consecutive times without medical reason, if treatment goals not met, if there is a change in medical status, if patient makes no progress towards goals or if patient is discharged from hospital.  The above assessment, treatment plan, treatment alternatives and goals were discussed and mutually agreed upon: by patient  Loel Dubonnet PT, DPT, CSRS 03/09/2023, 10:29 AM

## 2023-03-09 NOTE — Progress Notes (Signed)
PROGRESS NOTE   Subjective/Complaints:  Pt doing pretty well today, evals today. Slept ok. Denies pain. LBM Wednesday 3d ago, but this is normal for him. Urinating fine. Denies any other complaints or concerns today.   ROS: as per HPI. Denies CP, SOB, abd pain, N/V/D, or any other complaints at this time.    Objective:   No results found. Recent Labs    03/09/23 0615  WBC 6.0  HGB 12.8*  HCT 39.1  PLT 343   Recent Labs    03/09/23 0615  NA 138  K 3.8  CL 108  CO2 23  GLUCOSE 127*  BUN 26*  CREATININE 3.37*  CALCIUM 8.6*        Intake/Output Summary (Last 24 hours) at 03/09/2023 1212 Last data filed at 03/09/2023 0939 Gross per 24 hour  Intake 240 ml  Output --  Net 240 ml        Physical Exam: Vital Signs Blood pressure (!) 164/98, pulse 83, temperature 98.5 F (36.9 C), temperature source Oral, resp. rate 18, height 5\' 10"  (1.778 m), weight 117.8 kg, SpO2 95%.  Constitutional: No apparent distress. Appropriate appearance for age. Laying in bed HENT: No JVD. Neck Supple. Trachea midline. Atraumatic, normocephalic. Eyes: PERRLA. EOMI. Visual fields grossly intact. No appreciable nystagmus on exam.  Cardiovascular: RRR, no murmurs/rub/gallops. No Edema, + TEDs up to knees. Respiratory: CTAB. No rales, rhonchi, or wheezing. On RA.  Abdomen: + bowel sounds, normoactive. No distention or tenderness.  Skin: C/D/I. No apparent lesions. MSK: No apparent deformity.  PRIOR EXAM: Neurologic exam:  Cognition: AAO to person, place, time and event.  Language: Fluent, No substitutions or neoglisms. No dysarthria. Names 3/3 objects correctly.  Memory: Recalls 3/3 objects at 5 minutes. No apparent deficits  Insight: Good insight into current condition.  Mood: Pleasant affect, appropriate mood.  Sensation: To light touch intact in BL UEs and Les. Mild stocking pattern b/l neuropathy.  Reflexes: 2+ in BL UE and  LEs. Negative Hoffman's and babinski signs bilaterally.  CN: 2-12 grossly intact.  Coordination: No apparent tremors. Mild L sided ataxia on FTN, HTS. Spasticity: MAS 0 in all extremities.  Strength:                RUE: 5/5 SA, 5/5 EF, 5/5 EE, 5/5 WE, 5/5 FF, 5/5 FA                 LUE: 5/5 SA, 5/5 EF, 5/5 EE, 5/5 WE, 5/5 FF, 5/5 FA                 RLE: 5/5 HF, 5/5 KE, 5/5 DF, 5/5 EHL, 5/5 PF                 LLE:  5/5 HF, 5/5 KE, 5/5 DF, 5/5 EHL, 5/5 PF   Assessment/Plan: 1. Functional deficits which require 3+ hours per day of interdisciplinary therapy in a comprehensive inpatient rehab setting. Physiatrist is providing close team supervision and 24 hour management of active medical problems listed below. Physiatrist and rehab team continue to assess barriers to discharge/monitor patient progress toward functional and medical goals  Care Tool:  Bathing  Bathing assist       Upper Body Dressing/Undressing Upper body dressing        Upper body assist      Lower Body Dressing/Undressing Lower body dressing            Lower body assist       Toileting Toileting    Toileting assist       Transfers Chair/bed transfer  Transfers assist     Chair/bed transfer assist level: Contact Guard/Touching assist     Locomotion Ambulation   Ambulation assist      Assist level: Minimal Assistance - Patient > 75% Assistive device: No Device Max distance: 200 ft   Walk 10 feet activity   Assist     Assist level: Supervision/Verbal cueing Assistive device: No Device   Walk 50 feet activity   Assist    Assist level: Minimal Assistance - Patient > 75% Assistive device: No Device    Walk 150 feet activity   Assist    Assist level: Minimal Assistance - Patient > 75% Assistive device: No Device    Walk 10 feet on uneven surface  activity   Assist Walk 10 feet on uneven surfaces activity did not occur: Safety/medical concerns          Wheelchair     Assist Is the patient using a wheelchair?: No   Wheelchair activity did not occur: N/A         Wheelchair 50 feet with 2 turns activity    Assist    Wheelchair 50 feet with 2 turns activity did not occur: N/A       Wheelchair 150 feet activity     Assist  Wheelchair 150 feet activity did not occur: N/A       Blood pressure (!) 164/98, pulse 83, temperature 98.5 F (36.9 C), temperature source Oral, resp. rate 18, height 5\' 10"  (1.778 m), weight 117.8 kg, SpO2 95%.  Medical Problem List and Plan: 1. 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  Antithrombotics: -DVT/anticoagulation:  Pharmaceutical: Lovenox added 40mg  daily             -antiplatelet therapy:  DAPT X 3 weeks followed by ASA alone 3. Right sided headaches/Pain Management:  tylenol prn.  4. Mood/Behavior/Sleep: LCSW to follow for evaluation and support.              -antipsychotic agents: N/A 5. Neuropsych/cognition: This patient is capable of making decisions on his own behalf. 6. Skin/Wound Care: Routine pressure relief measures.  7. Fluids/Electrolytes/Nutrition: Monitor I/O. Check CMET in am.  8. HTN: Monitor BP QID--continue hydralazine 100mg  q8h, amlodipine 10mg  daily, and labetalol 300mg  q8h.              --was on amlodipine and hydrochlorothiazide PTA.             --supine HTN recorded and contributing to dizziness. Encourage fluid intake --Lying SBP 162/103 w/P-78--> Sitting SBP 119/94 w/P-90. Orthostatic vitals ordered. --Wears TEDs for chronic peripheral edema. Will order binder for use (question autonomic dysfunction)  -03/09/23 BPs a little elevated, will see what trend is before adjusting meds, may need hydrochlorothiazide added back? Vitals:   03/08/23 1934 03/09/23 0617  BP: (!) 161/89 (!) 164/98    9. T1DM: Hgb A1C-13.8  and poorly  controlled due to lack of insulin pump/insurance issues.  --was on Lantus 40 units once a day w/counting carbs-BS running 300s for a while --Now on Levmir 20 units BID with novolog 2 units ac TID  --Will monitor BS ac/hs and use SSI for elevated BS and titrate as indicated -03/09/23 CBGs looking much better; monitor closely CBG (last 3)  Recent Labs    03/08/23 2036 03/09/23 0618 03/09/23 1152  GLUCAP 160* 121* 90    10. CKD: SCr- 1.94 (02/2022). Now BUN/SCr 48/3.31---> 30/3.07              --on Sodium bicarb TID for NAGMA-  -03/09/23 Cr 3.37, BUN 26-- encourage PO fluid intake for now, hopefully labs on Monday will show downtrend without IVFs being needed; this may be his new baseline (3-3.3) 11. Hyperlipidemia: LDL-130 (was 396 a year ago). Continue atorvastatin 80mg  daily --Leqvio prior authorization started on acute.  12. Dizziness: Vestibular evaluation and treat. Denies dizziness, double vision or nausea. Continue PRN meclizine and compazine 13. Constipation: Being managed with Senna BID and Miralax daily -03/09/23 LBM 3 days ago but normal for him; monitor today, if no BM tomorrow then may need further titration of meds.  14. Anemia of chronic disease?: Hgb 12.5 @ admission. Recheck 03/09/23 showing 12.8; monitor on routine labs  15. OSA: Has been using CPAP at nights.    LOS: 1 days A FACE TO FACE EVALUATION WAS PERFORMED  [address] 03/09/2023, 12:12 PM

## 2023-03-10 DIAGNOSIS — K5901 Slow transit constipation: Secondary | ICD-10-CM | POA: Diagnosis not present

## 2023-03-10 DIAGNOSIS — I639 Cerebral infarction, unspecified: Secondary | ICD-10-CM | POA: Diagnosis not present

## 2023-03-10 DIAGNOSIS — I10 Essential (primary) hypertension: Secondary | ICD-10-CM | POA: Diagnosis not present

## 2023-03-10 DIAGNOSIS — E1022 Type 1 diabetes mellitus with diabetic chronic kidney disease: Secondary | ICD-10-CM | POA: Diagnosis not present

## 2023-03-10 LAB — GLUCOSE, CAPILLARY
Glucose-Capillary: 117 mg/dL — ABNORMAL HIGH (ref 70–99)
Glucose-Capillary: 125 mg/dL — ABNORMAL HIGH (ref 70–99)
Glucose-Capillary: 101 mg/dL — ABNORMAL HIGH (ref 70–99)
Glucose-Capillary: 179 mg/dL — ABNORMAL HIGH (ref 70–99)

## 2023-03-10 NOTE — Progress Notes (Signed)
   03/10/23 2029  BiPAP/CPAP/SIPAP  BiPAP/CPAP/SIPAP Pt Type Adult  BiPAP/CPAP/SIPAP Resmed  Reason BIPAP/CPAP not in use Other(comment) (Standby at bedside)  Patient Home Equipment Yes  MEWS Score/Color  MEWS Score 0  MEWS Score Color Bradley Davis

## 2023-03-10 NOTE — Progress Notes (Signed)
Physical Therapy Session Note  Patient Details  Name: Bradley Davis MRN: [id number] Date of Birth: 14-Jun-1986  Today's Date: 03/10/2023 PT Individual Time: 5188-4166 PT Individual Time Calculation (min): 60 min   Short Term Goals: Week 1:  PT Short Term Goal 1 (Week 1): Pt will perform rise to stand with overall supervision and decreased apprehension and decreased R lateral bias. PT Short Term Goal 2 (Week 1): Pt will ambulate >252ft with <50% LOB to R requiring no more than CGA to maintain. PT Short Term Goal 3 (Week 1): Pt will perform stair negotiation with no UE support and overall supervision/ light CGA. PT Short Term Goal 4 (Week 1): Pt will relate decrease in R sided bias with improved midline orientation.  Skilled Therapeutic Interventions/Progress Updates:      Therapy Documentation Precautions:  Precautions Precautions: Fall Precaution Comments: orthostatic hypotension, significant bias to R and mild to mod posterior, orders for abd binder when standing Restrictions Weight Bearing Restrictions: No  PT agreeable to vestibular evaluation due to complaints of dizziness.   Onset: 03/07/23  Duration: 1.5 minutes   Symptoms: vertigo, lightheadedness, N/V  Aggravating factors: horizontal cervical rotation, lying on right side   Easing factors: positional changes, deep breathing    Objective Measures   Positive Orthostatic Hypotension: education provided on compression hoses for BP management    Supine: 159/85, P: 84    Sitting: 158/98 , P: 87    Standing x 1 min: 129/70, 97    Standing x 3 min: 130/74, 89    Negative VBI   Ocular ROM WFL   Smooth Pursuits WFL   Saccades: Positive R eye undershoot with right visual scanning   HIT Negative Bilateral Directions   Dix Hallpike Negative Bilateral Directions   Overall Findings: Pt presents with central vestibular impairment and would benefit from habituation and VOR exercises. Pt educated on how to perform HEP and to  progress. PA notified following end of treatment session.    Pt (S) with bed mobility and sit<>stand without AD in session and left semi-reclined in bed with all needs in reach and alarm on.   Therapy/Group: Individual Therapy  Truitt Leep Truitt Leep PT, DPT  03/10/2023, 11:48 AM

## 2023-03-10 NOTE — Progress Notes (Signed)
Physical Therapy Session Note  Patient Details  Name: Bradley Davis MRN: [id number] Date of Birth: 1986/01/16  Today's Date: 03/10/2023 PT Individual Time: 0930-1015 + 05-1158 PT Individual Time Calculation (min): 45 min  + 59 min  Short Term Goals: Week 1:  PT Short Term Goal 1 (Week 1): Pt will perform rise to stand with overall supervision and decreased apprehension and decreased R lateral bias. PT Short Term Goal 2 (Week 1): Pt will ambulate >245ft with <50% LOB to R requiring no more than CGA to maintain. PT Short Term Goal 3 (Week 1): Pt will perform stair negotiation with no UE support and overall supervision/ light CGA. PT Short Term Goal 4 (Week 1): Pt will relate decrease in R sided bias with improved midline orientation.  Skilled Therapeutic Interventions/Progress Updates:      1st session: Pt in bed to start - denies any significant nausea, reports feeling better after the vestibular evaluation this AM. No reports of pain either.  Bed mobility completed mod I. Sit<>stand and stand pivot transfer with CGA from EOB to w/c, no AD used. Transported to main rehab gym for time. Session focused on NMR for dynamic standing balance and dynamic gait. Obstacle course with cone weaving, 6" hurdles, stepping over 4" platform, and ambulating on unlevel mat - all with min guard to CGA and no AD. Added tidal tank for dual-cog task while instructed to keep level during dynamic gait NMR training. Also added agility ladder, cone taps, and side stepping to challenge and upgrade tasks. Min guard for all activities.   Stair training assessed using 6" steps and 2 hand rails - CGA for navigating x12 steps with slow, cautious step-to pattern.  Concluded session in bed with all needs met - aware of upcoming therapy schedule.    2nd session: Pt lying in bed - agreeable to therapy treatment. Bed mobility completed independently. Sit<>stand with no AD at CGA level, wide BOS to accommodate for poor  standing balance. Ambulated to main rehab gym with CGA and no AD - gait ataxic with R trunk lean. Ataxia and unsteadiness worsens with head turns or distractions.  NMR for dynamic standing balance in // bars with inverted bosu ball and min guard for safety - feet apart, eyes open - patient demonstrating consistent R trunk lean while unsupported and unable to correct or sustain unsupported standing on inverted bosu for >5 seconds. Added lateral single leg step up's on his R to encourage weight bearing and proprioceptive feedback training. 1x10 squats on inverted bosu with BUE support.  Worked on simulating work related tasks - pt carrying 40# basket with weights in therapy gym with CGA, no difficulty managing load but difficulty with turns and ambulating while carrying the weight. Worked on placing weighted basket on varying surface height. Pt then instructed in placing resistive clothes pins onto basketball net at eye level - focusing more on the tight turns that were added for each pin to work on gaze stabilization and dynamic standing balance.  4-square step testing (un-timed) with min guard in all directions, most difficulty with diagonals and quick stepping patterns.   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  Finished session by assessing car transfer with car  height simulating a mid-size sedan - completes at [facility] level and by side stepping method, not much difficulty.   Ambulated back to his room from ortho rehab gym, ~255ft, with  CGA and no AD. Session concluded with patient sitting EOB, all needs met, aware of upcoming therapy schedule.      Therapy Documentation Precautions:  Precautions Precautions: Fall Precaution Comments: orthostatic hypotension, significant bias to R and mild to mod posterior, orders for abd binder when standing Restrictions Weight Bearing Restrictions: No General:      Therapy/Group: Individual Therapy  Shambria Camerer P Stonewall Doss  PT, DPT, CSRS  03/10/2023, 7:45 AM

## 2023-03-10 NOTE — Progress Notes (Signed)
Physical Therapy Session Note  Patient Details  Name: Bradley Davis MRN: [id number] Date of Birth: 10-05-1985  Today's Date: 03/09/2023 PT Individual Time:  1030-1044  PT Individual Time Calculation (min): 14 min and  Today's Date: 03/09/2023 PT Missed Time:  46 min Missed Time Reason:  Patient ill (violent n/v); Patient fatigue; Patient unwilling to participate   Short Term Goals: Week 1:  PT Short Term Goal 1 (Week 1): Pt will perform rise to stand with overall supervision and decreased apprehension and decreased R lateral bias. PT Short Term Goal 2 (Week 1): Pt will ambulate >259ft with <50% LOB to R requiring no more than CGA to maintain. PT Short Term Goal 3 (Week 1): Pt will perform stair negotiation with no UE support and overall supervision/ light CGA. PT Short Term Goal 4 (Week 1): Pt will relate decrease in R sided bias with improved midline orientation.  Skilled Therapeutic Interventions/Progress Updates:  Upon entrance to room, pt vomiting into emesis bag while sidelying to R in bed. When able, pt relates that n/v stimulated when pt lies to R side. When asked why he is lying to R side, pt relates that he is attempting to "fix" his symptoms. LPN notified and informed that PT will return to room in 15 min to see if n/v has relieved prior to request for medicine for improved chance of keeping meds in system.   On return, pt is resting on back in supine with eyes closed. Continues to feel nauseous but no longer vomiting and has just received anti-nausea meds from LPN. Educated pt on vestibular evaluation scheduled for Sunday morning and potential for stimulating n/v in order to improve pt's awareness and provide info to prep self.   Pt missed 46 min of skilled therapy due to n/v. Will re-attempt as schedule and pt availability permits.   Therapy Documentation Precautions:  Precautions Precautions: Fall Precaution Comments: orthostatic hypotension, significant bias to R and mild  to mod posterior, orders for abd binder when standing Restrictions Weight Bearing Restrictions: No  Pain: Pain Assessment Pain Score: 0-No pain this session, but pt nauseous with extensive vomiting.   Therapy/Group: Individual Therapy  Loel Dubonnet PT, DPT, CSRS 03/10/2023, 11:42 PM

## 2023-03-10 NOTE — Progress Notes (Signed)
Patient has been informed that he is a source of potential blood-borne pathogen exposure and will have labs drawn.

## 2023-03-10 NOTE — Progress Notes (Signed)
PROGRESS NOTE   Subjective/Complaints:  Pt doing pretty well today again. Slept so-so; lots of interruptions last night.  Denies pain. LBM Wednesday 4d ago, but this is still normal for him-- he hadn't been using the miralax/senna the way it's prescribed, had been skipping them; advised to use them as ordered and see if we have success, also increase water intake. Urinating fine. Denies any other complaints or concerns today.   ROS: as per HPI. Denies CP, SOB, abd pain, N/V/D, or any other complaints at this time.    Objective:   No results found. Recent Labs    03/09/23 0615  WBC 6.0  HGB 12.8*  HCT 39.1  PLT 343   Recent Labs    03/09/23 0615  NA 138  K 3.8  CL 108  CO2 23  GLUCOSE 127*  BUN 26*  CREATININE 3.37*  CALCIUM 8.6*        Intake/Output Summary (Last 24 hours) at 03/10/2023 1153 Last data filed at 03/10/2023 0700 Gross per 24 hour  Intake 720 ml  Output --  Net 720 ml        Physical Exam: Vital Signs Blood pressure (!) 162/92, pulse 87, temperature 98.4 F (36.9 C), temperature source Oral, resp. rate 17, height 5\' 10"  (1.778 m), weight 117.8 kg, SpO2 97%.  Constitutional: No apparent distress. Appropriate appearance for age. Laying in bed HENT: No JVD. Neck Supple. Trachea midline. Atraumatic, normocephalic. Eyes: PERRLA. EOMI. Visual fields grossly intact. No appreciable nystagmus on exam.  Cardiovascular: RRR, no murmurs/rub/gallops. No Edema, + TEDs up to knees. Respiratory: CTAB. No rales, rhonchi, or wheezing. On RA.  Abdomen: + bowel sounds, slightly hypoactive. No distention or tenderness. Soft. Skin: C/D/I. No apparent lesions. MSK: No apparent deformity.  PRIOR EXAM: Neurologic exam:  Cognition: AAO to person, place, time and event.  Language: Fluent, No substitutions or neoglisms. No dysarthria. Names 3/3 objects correctly.  Memory: Recalls 3/3 objects at 5 minutes. No  apparent deficits  Insight: Good insight into current condition.  Mood: Pleasant affect, appropriate mood.  Sensation: To light touch intact in BL UEs and Les. Mild stocking pattern b/l neuropathy.  Reflexes: 2+ in BL UE and LEs. Negative Hoffman's and babinski signs bilaterally.  CN: 2-12 grossly intact.  Coordination: No apparent tremors. Mild L sided ataxia on FTN, HTS. Spasticity: MAS 0 in all extremities.  Strength:                RUE: 5/5 SA, 5/5 EF, 5/5 EE, 5/5 WE, 5/5 FF, 5/5 FA                 LUE: 5/5 SA, 5/5 EF, 5/5 EE, 5/5 WE, 5/5 FF, 5/5 FA                 RLE: 5/5 HF, 5/5 KE, 5/5 DF, 5/5 EHL, 5/5 PF                 LLE:  5/5 HF, 5/5 KE, 5/5 DF, 5/5 EHL, 5/5 PF   Assessment/Plan: 1. Functional deficits which require 3+ hours per day of interdisciplinary therapy in a comprehensive inpatient rehab setting. Physiatrist is providing close  team supervision and 24 hour management of active medical problems listed below. Physiatrist and rehab team continue to assess barriers to discharge/monitor patient progress toward functional and medical goals  Care Tool:  Bathing    Body parts bathed by patient: Right arm, Left arm, Chest, Abdomen, Front perineal area, Buttocks, Right upper leg, Left upper leg, Right lower leg, Left lower leg, Face         Bathing assist Assist Level: Supervision/Verbal cueing     Upper Body Dressing/Undressing Upper body dressing   What is the patient wearing?: Pull over shirt    Upper body assist Assist Level: Independent    Lower Body Dressing/Undressing Lower body dressing      What is the patient wearing?: Underwear/pull up, Pants     Lower body assist Assist for lower body dressing: Supervision/Verbal cueing     Toileting Toileting    Toileting assist Assist for toileting: Supervision/Verbal cueing     Transfers Chair/bed transfer  Transfers assist     Chair/bed transfer assist level: Contact Guard/Touching assist      Locomotion Ambulation   Ambulation assist      Assist level: Minimal Assistance - Patient > 75% Assistive device: No Device Max distance: 200 ft   Walk 10 feet activity   Assist     Assist level: Supervision/Verbal cueing Assistive device: No Device   Walk 50 feet activity   Assist    Assist level: Minimal Assistance - Patient > 75% Assistive device: No Device    Walk 150 feet activity   Assist    Assist level: Minimal Assistance - Patient > 75% Assistive device: No Device    Walk 10 feet on uneven surface  activity   Assist Walk 10 feet on uneven surfaces activity did not occur: Safety/medical concerns         Wheelchair     Assist Is the patient using a wheelchair?: No   Wheelchair activity did not occur: N/A         Wheelchair 50 feet with 2 turns activity    Assist    Wheelchair 50 feet with 2 turns activity did not occur: N/A       Wheelchair 150 feet activity     Assist  Wheelchair 150 feet activity did not occur: N/A       Blood pressure (!) 162/92, pulse 87, temperature 98.4 F (36.9 C), temperature source Oral, resp. rate 17, height 5\' 10"  (1.778 m), weight 117.8 kg, SpO2 97%.  Medical Problem List and Plan: 1. 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  Antithrombotics: -DVT/anticoagulation:  Pharmaceutical: Lovenox added 40mg  daily             -antiplatelet therapy:  DAPT X 3 weeks followed by ASA alone 3. Right sided headaches/Pain Management:  tylenol prn.  4. Mood/Behavior/Sleep: LCSW to follow for evaluation and support.              -antipsychotic agents: N/A 5. Neuropsych/cognition: This patient is capable of making decisions on his own behalf. 6. Skin/Wound Care: Routine pressure relief measures.  7. Fluids/Electrolytes/Nutrition: Monitor I/O. Check CMET in am.  8. HTN: Monitor BP  QID--continue hydralazine 100mg  q8h, amlodipine 10mg  daily, and labetalol 300mg  q8h.              --  was on amlodipine and hydrochlorothiazide PTA.             --supine HTN recorded and contributing to dizziness. Encourage fluid intake --Lying SBP 162/103 w/P-78--> Sitting SBP 119/94 w/P-90. Orthostatic vitals ordered. --Wears TEDs for chronic peripheral edema. Will order binder for use (question autonomic dysfunction)  -8/31-9/1 BPs a little elevated, will see what trend is before adjusting meds, may need hydrochlorothiazide added back? Vitals:   03/08/23 1934 03/09/23 0617 03/09/23 1248 03/09/23 1917  BP: (!) 161/89 (!) 164/98 (!) 155/88 (!) 157/75   03/10/23 0447 03/10/23 0643 03/10/23 0647 03/10/23 0648  BP: (!) 155/98 (!) 167/102 130/82 (!) 162/92    9. T1DM: Hgb A1C-13.8 and poorly controlled due to lack of insulin pump/insurance issues.  --was on Lantus 40 units once a day w/counting carbs-BS running 300s for a while --Now on Levmir 20 units BID with novolog 2 units ac TID  --Will monitor BS ac/hs and use SSI for elevated BS and titrate as indicated -8/31-9/1 CBGs looking much better; monitor closely CBG (last 3)  Recent Labs    03/09/23 1652 03/09/23 2031 03/10/23 0615  GLUCAP 130* 113* 117*    10. CKD: SCr- 1.94 (02/2022). Now BUN/SCr 48/3.31---> 30/3.07              --on Sodium bicarb TID for NAGMA-  -03/09/23 Cr 3.37, BUN 26-- encourage PO fluid intake for now, hopefully labs on Monday will show downtrend without IVFs being needed; this may be his new baseline (3-3.3) 11. Hyperlipidemia: LDL-130 (was 396 a year ago). Continue atorvastatin 80mg  daily --Leqvio prior authorization started on acute.  12. Dizziness/Orthostasis: Vestibular evaluation and treat. Denies dizziness, double vision or nausea. Continue PRN meclizine and compazine -03/10/23 per PT Gaye Pollack, negative for BPPV, +orthostasis when standing; has TEDs; advised increased water intake. Monitor 13. Constipation:  Being managed with Senna BID and Miralax daily -03/10/23 LBM 4 days ago but normal for him; has been skipping meds; advised to increase water intake and take meds as directed, if no BM tomorrow then may need further intervention 14. Anemia of chronic disease?: Hgb 12.5 @ admission. Recheck 03/09/23 showing 12.8; monitor on routine labs  15. OSA: Has been using CPAP at nights.    LOS: 2 days A FACE TO FACE EVALUATION WAS PERFORMED  [address] 03/10/2023, 11:53 AM

## 2023-03-10 NOTE — Progress Notes (Signed)
Occupational Therapy Session Note  Patient Details  Name: Bradley Davis MRN: [id number] Date of Birth: 11/22/1985  Today's Date: 03/10/2023 OT Individual Time: 1315-1400 OT Individual Time Calculation (min): 45 min    Short Term Goals: Week 1:  OT Short Term Goal 1 (Week 1): STG = LTG 2/2 ELOS  Skilled Therapeutic Interventions/Progress Updates:    Patient received sitting up in bed.  Patient awake and agreeable to OT session. Patient ambulated to therapy gym without device with min facilitation to address reducing lateral sway (especially to R), Maintaining alignment of upper trunk over base of support - not behind, step length, step width, and general cadence with walking.  Patient very attentive and receptive to cueing.  Patient with good body awareness and without obvios cognitive deficits.  Patient very motivated for improved function.   Started in quadruped to address core stability and ability to weight shift onto active R limbs.  Patient stiff in hips and trunk, but responds well to yoga like movements - cat/cow, child's pose, bird dog poses.   Followed with task oriented walking.  Walking and catching a ball, stopping/turning abruptly.  Patient without significant loss of balance.   Discussed driving. Recommend patient wait for MD clearance to return to driving - and shared that considerations were - eye hand coordination, eye hand foot coord, reaction time, attention, visual ability - no significant impairments noted in any of these areas so anticipate patient would be allowed to return to driving in near future.   Patient ambulated back to room, and back to bed with alarm engaged and call bell/ personal items in reach.    Therapy Documentation Precautions:  Precautions Precautions: Fall Precaution Comments: orthostatic hypotension, significant bias to R and mild to mod posterior, orders for abd binder when standing Restrictions Weight Bearing Restrictions: No Pain: Pain  Assessment Pain Scale: 0-10 Pain Score: 4  Pain Location: Other (Comment) (left temporal) Pain Descriptors / Indicators: Headache Pain Intervention(s): Medication (See eMAR)       Therapy/Group: Individual Therapy  Collier Salina 03/10/2023, 4:26 PM

## 2023-03-11 DIAGNOSIS — I639 Cerebral infarction, unspecified: Secondary | ICD-10-CM | POA: Diagnosis not present

## 2023-03-11 LAB — BASIC METABOLIC PANEL
Anion gap: 8 (ref 5–15)
BUN: 22 mg/dL — ABNORMAL HIGH (ref 6–20)
CO2: 21 mmol/L — ABNORMAL LOW (ref 22–32)
Calcium: 8.6 mg/dL — ABNORMAL LOW (ref 8.9–10.3)
Chloride: 106 mmol/L (ref 98–111)
Creatinine, Ser: 2.85 mg/dL — ABNORMAL HIGH (ref 0.61–1.24)
GFR, Estimated: 28 mL/min — ABNORMAL LOW (ref >60)
Glucose, Bld: 118 mg/dL — ABNORMAL HIGH (ref 70–99)
Potassium: 3.9 mmol/L (ref 3.5–5.1)
Sodium: 135 mmol/L (ref 135–145)

## 2023-03-11 LAB — GLUCOSE, CAPILLARY
Glucose-Capillary: 219 mg/dL — ABNORMAL HIGH (ref 70–99)
Glucose-Capillary: 171 mg/dL — ABNORMAL HIGH (ref 70–99)
Glucose-Capillary: 95 mg/dL (ref 70–99)
Glucose-Capillary: 149 mg/dL — ABNORMAL HIGH (ref 70–99)

## 2023-03-11 LAB — CBC
HCT: 36.8 % — ABNORMAL LOW (ref 39.0–52.0)
Hemoglobin: 12.4 g/dL — ABNORMAL LOW (ref 13.0–17.0)
MCH: 27.9 pg (ref 26.0–34.0)
MCHC: 33.7 g/dL (ref 30.0–36.0)
MCV: 82.9 fL (ref 80.0–100.0)
Platelets: 320 10*3/uL (ref 150–400)
RBC: 4.44 MIL/uL (ref 4.22–5.81)
RDW: 12.7 % (ref 11.5–15.5)
WBC: 6.9 10*3/uL (ref 4.0–10.5)
nRBC: 0 % (ref 0.0–0.2)

## 2023-03-11 MED ORDER — CLONIDINE HCL 0.1 MG PO TABS
0.1000 mg | ORAL_TABLET | Freq: Two times a day (BID) | ORAL | Status: DC
  Administered 2023-03-11 – 2023-03-12 (×3): 0.1 mg via ORAL
  Filled 2023-03-11 (×4): qty 1

## 2023-03-11 MED ORDER — INSULIN DETEMIR 100 UNIT/ML ~~LOC~~ SOLN
22.0000 [IU] | Freq: Two times a day (BID) | SUBCUTANEOUS | Status: DC
  Administered 2023-03-11 – 2023-03-14 (×6): 22 [IU] via SUBCUTANEOUS
  Filled 2023-03-11 (×7): qty 0.22

## 2023-03-11 NOTE — IPOC Note (Signed)
Overall Plan of Care [facility]) Patient Details Name: Bradley Davis MRN: [id number] DOB: 11-02-1985  Admitting Diagnosis: Cerebellar stroke [facility])  Hospital Problems: Principal Problem:   Cerebellar stroke (HCC)     Functional Problem List: Nursing Endurance, Medication Management, Safety, Bowel  PT Balance, Motor, Perception  OT Balance, Endurance, Motor  SLP    TR         Basic ADL's: OT Bathing, Dressing, Toileting     Advanced  ADL's: OT Simple Meal Preparation     Transfers: PT Bed to Chair, Car, Furniture, Floor  OT Toilet, Tub/Shower     Locomotion: PT Ambulation, Stairs     Additional Impairments: OT None  SLP        TR      Anticipated Outcomes Item Anticipated Outcome  Self Feeding Independent  Swallowing      Basic self-care  Mod I  Toileting  Mod I   Bathroom Transfers Mod I  Bowel/Bladder  manage bowel w mod I assist  Transfers  supervision/ Mod I  Locomotion  supervision/ Mod I  Communication     Cognition     Pain  n/a  Safety/Judgment  manage w cues   Therapy Plan: PT Intensity: Minimum of 1-2 x/day ,45 to 90 minutes PT Frequency: 5 out of 7 days PT Duration Estimated Length of Stay: 10-14 days OT Intensity: Minimum of 1-2 x/day, 45 to 90 minutes OT Frequency: 5 out of 7 days OT Duration/Estimated Length of Stay: 7-10 days     Team Interventions: Nursing Interventions Disease Management/Prevention, Bowel Management, Patient/Family Education, Medication Management, Discharge Planning  PT interventions Ambulation/gait training, Community reintegration, DME/adaptive equipment instruction, Neuromuscular re-education, Psychosocial support, Stair training, UE/LE Strength taining/ROM, Warden/ranger, Discharge planning, Skin care/wound management, Therapeutic Activities, UE/LE Coordination activities, Cognitive remediation/compensation, Disease management/prevention, Functional mobility training, Patient/family education,  Therapeutic Exercise  OT Interventions Balance/vestibular training, Discharge planning, Pain management, Self Care/advanced ADL retraining, Therapeutic Activities, UE/LE Coordination activities, Disease mangement/prevention, Functional mobility training, Patient/family education, Therapeutic Exercise, Community reintegration, DME/adaptive equipment instruction, Neuromuscular re-education, Psychosocial support, UE/LE Strength taining/ROM  SLP Interventions    TR Interventions    SW/CM Interventions     Barriers to Discharge MD  Medical stability  Nursing Decreased caregiver support 1 level/level entry solo; family to assist as needed, 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  PT Decreased caregiver support, Community education officer for SNF coverage, Weight, Medication compliance    OT Lack of/limited family support    SLP      SW       Team Discharge Planning: Destination: PT-Home ,OT- Home , SLP-  Projected Follow-up: PT-Outpatient PT, 24 hour supervision/assistance, OT-  Outpatient OT, SLP-  Projected Equipment Needs: PT-To be determined, OT- To be determined, SLP-  Equipment Details: PT- , OT-  Patient/family involved in discharge planning: PT- Patient,  OT-Patient, SLP-   MD ELOS: 7-10d Medical Rehab Prognosis:  Excellent Assessment: The patient has been admitted for CIR therapies with the diagnosis of Medullary infarct . The team will be addressing functional mobility, strength, stamina, balance, safety, adaptive techniques and equipment, self-care, bowel and bladder mgt, patient and caregiver education, BP and DM management . Goals have been set at Mod I. Anticipated discharge destination is Home .        See Team Conference Notes for weekly updates to the plan of care

## 2023-03-11 NOTE — Progress Notes (Signed)
Occupational Therapy Session Note  Patient Details  Name: Bradley Davis MRN: [id number] Date of Birth: 08/23/1985  Today's Date: 03/11/2023 OT Individual Time: 0849-1000 session 1 OT Individual Time Calculation (min): 71 min  Session 2: 3086-5784   Short Term Goals: Week 1:  OT Short Term Goal 1 (Week 1): STG = LTG 2/2 ELOS  Skilled Therapeutic Interventions/Progress Updates:  Session 1:Pt greeted supine in bed, pt agreeable to OT intervention. Session focus on BADL reeducation, functional mobility, dynamic standing balance, LLE NMR and decreasing overall caregiver burden.       Pt completed supine>sit MODI. Pt donned shoes and teds from EOB with set- up assist. Pt completed functional ambulation into bathroom with no AD and CGA. Pt with continent urine void completing 3/3 toileting tasks with supervision. Pt sood at sink for hand hygiene and oral care with supervison.   Pt completed functional ambulation to gym with no AD and CGA. Noted pt leaning posteriorly during gait. Worked on shifting weight anteriorly in gym with pt standing on wedge to shift weight forward with pt instructed to complete building block [facility] task with an emphasis on BUE motor planning and dynamic standing balance. Pt needed MIN cues to sequence block task correctly as pt initially neglecting to follow the visual aid. Pt completed task with CGA for balance, mild R sway in standing.  Graded task up and had pt step up onto airex cushion with pt then instructed to transport items from L side >R side with an emphasis on challenging dynamic standing balance/functional reaching, pt completed task with CGA. Pt did endorse mild dizziness when stepping up/down BP assessed from sitting- 157/98( 114) HR91  Pt then instructed to step up on to aerobic step while pressing tidal tank overhead to challenge balance/endurance/functional weight shifting. Pt completed task with CGA- MINA x10 reps. 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   Pt able to stand on balance board with an emphasis on even weight distribution with pt completing standing FMC task on mat, pt completed task with CGA, mild R lean noted but pt with improved awareness to deficit noted to make active adjustments as needed.   Pt completed functional ambulation task in hallway with pt instructed to retrieve horseshoes from R<> L side of hallway with an emphasis on tiny turns while maintaining balance. Pt did experience one LOB to R side needing MIN A to correct.   Pt completed various NMR tasks from quadraped on mat table with pt completing x10 modified pushups on mat table. Pt also completed x10 bird dogs with an emphasis on building core strength/stability while maintaining synchronized breathing. Pt instructed to transfer bean bags from R side of mat to L side with an emphasis on WB'ing/weight shifting into BLE/BUEs, pt completed task with supervision. Increased fatigue reported.   Pt completed functional ambulation back to room with CGA with no AD.              Ended session with pt seated in recliner with all needs within reach and chair alarm activated.                    Session 2: Pt greeted seated in recliner, pt agreeable to OT intervention. Session focus on BADL reeducation, functional mobility, dynamic standing balance, IADLS and decreasing overall caregiver burden.  IV covered for bathing. Pt completed functional ambulation into bathroom with no AD and CGA. Pt completed all bathing tasks seated/standing on seat with supervision.  Pt completed functional ambulation out of bathroom with no AD and CGA. Pt completed dressing seated on EOB with overall supervision- set- up assist. Pt completed 3/3 toileting tasks with supervision with continent urine void.   Pts room needed to be moved, therefore had pt reach into  closet/drawers to remove personal items from room and load up on cart to simulate IADL task of picking up around the house.pt completed task with CGA- supervision. 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 Once in new room, pt does endorse fatigue needing seated rest break. Pt able to unpack items and put items away with no AD and CGA.   Pt completed functional ambulation down to tub room with no AD and CGA. Discussed shower DME options. Feel TTB is the safest option as this point, pt reports he already has TTB from his grandfather that he can use. Pt completed functional ambulation back to room, pt noticeably more fatigued with more of a R lean having 2x LOB to R side needing mIN A to correct.      Ended session with pt supine in bed with all needs within reach and bed alarm activated.                    Therapy Documentation Precautions:  Precautions Precautions: Fall Precaution Comments: orthostatic hypotension, significant bias to R and mild to mod posterior, orders for abd binder when standing Restrictions Weight Bearing Restrictions: No    Pain: no pain reported during either session     Therapy/Group: Individual Therapy  Pollyann Glen [facility] 03/11/2023, 10:16 AM

## 2023-03-11 NOTE — Progress Notes (Signed)
PROGRESS NOTE   Subjective/Complaints:  Discussed CVA risk factors  and management with Pt , namely BP and DM management  Fluid intake ~1045mL per day   ROS: as per HPI. Denies CP, SOB, abd pain, N/V/D, or any other complaints at this time.    Objective:   No results found. Recent Labs    03/09/23 0615  WBC 6.0  HGB 12.8*  HCT 39.1  PLT 343   Recent Labs    03/09/23 0615  NA 138  K 3.8  CL 108  CO2 23  GLUCOSE 127*  BUN 26*  CREATININE 3.37*  CALCIUM 8.6*        Intake/Output Summary (Last 24 hours) at 03/11/2023 1020 Last data filed at 03/11/2023 0700 Gross per 24 hour  Intake 1080 ml  Output --  Net 1080 ml        Physical Exam: Vital Signs Blood pressure (!) 188/91, pulse 89, temperature 98.8 F (37.1 C), temperature source Oral, resp. rate 18, height 5\' 10"  (1.778 m), weight 117.8 kg, SpO2 97%.   General: No acute distress Mood and affect are appropriate Heart: Regular rate and rhythm no rubs murmurs or extra sounds Lungs: Clear to auscultation, breathing unlabored, no rales or wheezes Abdomen: Positive bowel sounds, soft nontender to palpation, nondistended Extremities: No clubbing, cyanosis, or edema Skin: No evidence of breakdown, no evidence of rash   PRIOR EXAM: Neurologic exam:  Cognition: AAO to person, place, time and event.   Insight: Good insight into current condition.  Mood: Pleasant affect, appropriate mood.    CN: 2-12 grossly intact.  Coordination: no evidence of dysmetiria Intact fine motor finger to thumb  Spasticity: MAS 0 in all extremities.  Strength:                RUE: 5/5 SA, 5/5 EF, 5/5 EE, 5/5 WE, 5/5 FF, 5/5 FA                 LUE: 5/5 SA, 5/5 EF, 5/5 EE, 5/5 WE, 5/5 FF, 5/5 FA                 RLE: 5/5 HF, 5/5 KE, 5/5 DF, 5/5 EHL, 5/5 PF                 LLE:  5/5 HF, 5/5 KE, 5/5 DF, 5/5 EHL, 5/5 PF   Assessment/Plan: 1. Functional deficits which require  3+ hours per day of interdisciplinary therapy in a comprehensive inpatient rehab setting. Physiatrist is providing close team supervision and 24 hour management of active medical problems listed below. Physiatrist and rehab team continue to assess barriers to discharge/monitor patient progress toward functional and medical goals  Care Tool:  Bathing    Body parts bathed by patient: Right arm, Left arm, Chest, Abdomen, Front perineal area, Buttocks, Right upper leg, Left upper leg, Right lower leg, Left lower leg, Face         Bathing assist Assist Level: Supervision/Verbal cueing     Upper Body Dressing/Undressing Upper body dressing   What is the patient wearing?: Pull over shirt    Upper body assist Assist Level: Independent    Lower Body  Dressing/Undressing Lower body dressing      What is the patient wearing?: Underwear/pull up, Pants     Lower body assist Assist for lower body dressing: Supervision/Verbal cueing     Toileting Toileting    Toileting assist Assist for toileting: Supervision/Verbal cueing     Transfers Chair/bed transfer  Transfers assist     Chair/bed transfer assist level: Contact Guard/Touching assist     Locomotion Ambulation   Ambulation assist      Assist level: Minimal Assistance - Patient > 75% Assistive device: No Device Max distance: 200 ft   Walk 10 feet activity   Assist     Assist level: Supervision/Verbal cueing Assistive device: No Device   Walk 50 feet activity   Assist    Assist level: Minimal Assistance - Patient > 75% Assistive device: No Device    Walk 150 feet activity   Assist    Assist level: Minimal Assistance - Patient > 75% Assistive device: No Device    Walk 10 feet on uneven surface  activity   Assist Walk 10 feet on uneven surfaces activity did not occur: Safety/medical concerns         Wheelchair     Assist Is the patient using a wheelchair?: No   Wheelchair activity  did not occur: N/A         Wheelchair 50 feet with 2 turns activity    Assist    Wheelchair 50 feet with 2 turns activity did not occur: N/A       Wheelchair 150 feet activity     Assist  Wheelchair 150 feet activity did not occur: N/A       Blood pressure (!) 188/91, pulse 89, temperature 98.8 F (37.1 C), temperature source Oral, resp. rate 18, height 5\' 10"  (1.778 m), weight 117.8 kg, SpO2 97%.  Medical Problem List and Plan: 1. 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  Antithrombotics: -DVT/anticoagulation:  Pharmaceutical: Lovenox added 40mg  daily             -antiplatelet therapy:  DAPT X 3 weeks followed by ASA alone 3. Right sided headaches/Pain Management:  tylenol prn. Using 650mg  BID on average   4. Mood/Behavior/Sleep: LCSW to follow for evaluation and support.              -antipsychotic agents: N/A 5. Neuropsych/cognition: This patient is capable of making decisions on his own behalf. 6. Skin/Wound Care: Routine pressure relief measures.  7. Fluids/Electrolytes/Nutrition: Monitor I/O. Check CMET in am.  8. HTN: Monitor BP QID--continue hydralazine 100mg  q8h, amlodipine 10mg  daily, and labetalol 300mg  q8h.              --was on amlodipine and hydrochlorothiazide PTA.             --supine HTN recorded and contributing to dizziness. Encourage fluid intake --Lying SBP 162/103 w/P-78--> Sitting SBP 119/94 w/P-90. Orthostatic vitals ordered. --Wears TEDs for chronic peripheral edema. Will order binder for use (question autonomic dysfunction)  -8/31-9/1 BPs a little elevated, will see what trend is before adjusting meds, may need hydrochlorothiazide added back? Vitals:   03/08/23 1934 03/09/23 0617 03/09/23 1248 03/09/23 1917  BP: (!) 161/89 (!) 164/98 (!) 155/88 (!) 157/75   03/10/23 0447 03/10/23 9528 03/10/23 0647 03/10/23 0648  BP: Marland Kitchen)  155/98 (!) 167/102 130/82 (!) 162/92   03/10/23 1511 03/10/23 1928 03/10/23 1930 03/10/23 1932  BP: (!) 176/91 (!) 185/96 (!) 189/94 (!) 188/91  Still elevated  on max dose amlodipine , hydralazine and labetolol, no diuretics due to elevated BUN and Creat , will trial clonidine   9. T1DM: Hgb A1C-13.8 and poorly controlled due to lack of insulin pump/insurance issues.  --was on Lantus 40 units once a day w/counting carbs-BS running 300s for a while --Now on Levmir 20 units BID with novolog 2 units ac TID  --Will monitor BS ac/hs and use SSI for elevated BS and titrate as indicated -8/31-9/1 CBGs looking much better; monitor closely CBG (last 3)  Recent Labs    03/10/23 1632 03/10/23 2040 03/11/23 0631  GLUCAP 101* 179* 219*   Increase semglee to 22U , may need to d/c meal coverage  10. CKD: SCr- 1.94 (02/2022). Now BUN/SCr 48/3.31---> 30/3.07              --on Sodium bicarb TID for NAGMA-  -03/09/23 Cr 3.37, BUN 26-- encourage PO fluid intake for now, hopefully labs on Monday will show downtrend without IVFs being needed; this may be his new baseline (3-3.3)    Latest Ref Rng & Units 03/09/2023    6:15 AM 03/06/2023    9:15 AM 03/05/2023   10:05 AM  BMP  Glucose 70 - 99 mg/dL 119  147  829   BUN 6 - 20 mg/dL 26  30  32   Creatinine 0.61 - 1.24 mg/dL 5.62  1.30  8.65   Sodium 135 - 145 mmol/L 138  138  138   Potassium 3.5 - 5.1 mmol/L 3.8  3.8  4.1   Chloride 98 - 111 mmol/L 108  105  108   CO2 22 - 32 mmol/L 23  19  20    Calcium 8.9 - 10.3 mg/dL 8.6  8.7  8.8     11. Hyperlipidemia: LDL-130 (was 396 a year ago). Continue atorvastatin 80mg  daily --Leqvio prior authorization started on acute.  12. Dizziness/Orthostasis: Vestibular evaluation and treat. Denies dizziness, double vision or nausea. Continue PRN meclizine and compazine -03/10/23 per PT Gaye Pollack, negative for BPPV, +orthostasis when standing; has TEDs; advised increased water intake. Monitor 13. Constipation: Being managed  with Senna BID and Miralax daily -03/10/23 LBM , has been taking senna BID  14. Anemia of chronic disease?: Hgb 12.5 @ admission. Recheck 03/09/23 showing 12.8; monitor on routine labs  15. OSA: Has been using CPAP at nights.    LOS: 3 days A FACE TO FACE EVALUATION WAS PERFORMED  Erick Colace 03/11/2023, 10:20 AM

## 2023-03-11 NOTE — Progress Notes (Signed)
Inpatient Rehabilitation Center Individual Statement of Services  Patient Name:  Bradley Davis  Date:  03/11/2023  Welcome to the Inpatient Rehabilitation Center.  Our goal is to provide you with an individualized program based on your diagnosis and situation, designed to meet your specific needs.  With this comprehensive rehabilitation program, you will be expected to participate in at least 3 hours of rehabilitation therapies Monday-Friday, with modified therapy programming on the weekends.  Your rehabilitation program will include the following services:  Physical Therapy (PT), Occupational Therapy (OT), Speech Therapy (ST), 24 hour per day rehabilitation nursing, Therapeutic Recreaction (TR), Neuropsychology, Care Coordinator, Rehabilitation Medicine, Nutrition Services, Pharmacy Services, and Other  Weekly team conferences will be held on Wednesdays to discuss your progress.  Your Inpatient Rehabilitation Care Coordinator will talk with you frequently to get your input and to update you on team discussions.  Team conferences with you and your family in attendance may also be held.  Expected length of stay: 5-7 Days  Overall anticipated outcome: Mod I to supervision   Depending on your progress and recovery, your program may change. Your Inpatient Rehabilitation Care Coordinator will coordinate services and will keep you informed of any changes. Your Inpatient Rehabilitation Care Coordinator's name and contact numbers are listed  below.  The following services may also be recommended but are not provided by the Inpatient Rehabilitation Center:   Home Health Rehabiltiation Services Outpatient Rehabilitation Services    Arrangements will be made to provide these services after discharge if needed.  Arrangements include referral to agencies that provide these services.  Your insurance has been verified to be:  [place] MEDICAID [facility] Your primary doctor is:  Darnelle Going, FNP  Pertinent  information will be shared with your doctor and your insurance company.  Inpatient Rehabilitation Care Coordinator:  Lavera Guise, [place] [phone] or [phone]  Information discussed with and copy given to patient by: Andria Rhein, 03/11/2023, 9:14 AM

## 2023-03-11 NOTE — Progress Notes (Signed)
Inpatient Rehabilitation Care Coordinator Assessment and Plan Patient Details  Name: Bradley Davis MRN: [id number] Date of Birth: 22-Oct-1985  Today's Date: 03/11/2023  Hospital Problems: Principal Problem:   Cerebellar stroke [facility])  Past Medical History:  Past Medical History:  Diagnosis Date   CKD stage 3a, GFR 45-59 ml/min (HCC)    Diabetes mellitus    Hypertension    Necrotizing fasciitis (HCC) 04/2021   right groin   Past Surgical History:  Past Surgical History:  Procedure Laterality Date   INCISION AND DRAINAGE ABSCESS Right 04/21/2021   Procedure: INCISION, DRAINAGE, DEBRIDEMENT OF RIGHT THIGH/GROIN INFECTION;  Surgeon: Abigail Miyamoto, MD;  Location: [facility];  Service: General;  Laterality: Right;   IRRIGATION AND DEBRIDEMENT ABSCESS Right 04/23/2021   Procedure: IRRIGATION AND DEBRIDEMENT RIGHT GROIN WOUND; RIGHT THIGH DRESSING CHANGE;  Surgeon: Abigail Miyamoto, MD;  Location: [facility];  Service: General;  Laterality: Right;   IRRIGATION AND DEBRIDEMENT ABSCESS Right 04/26/2021   Procedure: reexploration and debridement of soft tissue infection right groin;  Surgeon: Emelia Loron, MD;  Location: [facility];  Service: General;  Laterality: Right;   RETINAL DETACHMENT SURGERY  2022   TONSILLECTOMY  2003   Social History:  reports that he has never smoked. He has never used smokeless tobacco. He reports that he does not currently use alcohol. He reports that he does not use drugs.  Family / Support Systems Marital Status: Single Patient Roles: Parent Spouse/Significant Other: n/a Other Supports: mother and other family Anticipated Caregiver: Mother (primary) and family if requiring supervision Ability/Limitations of Caregiver: none Family Dynamics: independent but supportive family  Social History Preferred language: English Religion: Baptist Cultural Background: Independent, working and driving Education: Charity fundraiser - How often do you need to have  someone help you when you read instructions, pamphlets, or other written material from your doctor or pharmacy?: Never Writes: Yes Employment Status: Employed 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[employer]) Return to Work Plans: yes Marine scientist Issues: n/a Guardian/Conservator: n/a   Abuse/Neglect Abuse/Neglect Assessment Can Be Completed: Yes Physical Abuse: Denies Verbal Abuse: Denies Sexual Abuse: Denies Exploitation of patient/patient's resources: Denies Self-Neglect: Denies  Patient response to: Social Isolation - How often do you feel lonely or isolated from those around you?: Never  Emotional Status Pt's affect, behavior and adjustment status: Pleasant Recent Psychosocial Issues: Coping Psychiatric History: N.a Substance Abuse History: N/a  Patient / Family Perceptions, Expectations & Goals Pt/Family understanding of illness & functional limitations: yes Premorbid pt/family roles/activities: Independent, working ans driving Anticipated changes in roles/activities/participation: Plans to remain independent. Mother and family able to supevise if needed Pt/family expectations/goals: Mod I/Sup  [employer]: None Premorbid Home Care/DME Agencies: None Transportation available at discharge: mother or family Is the patient able to respond to transportation needs?: Yes In the past 12 months, has lack of transportation kept you from medical appointments or from getting medications?: No In the past 12 months, has lack of transportation kept you from meetings, work, or from getting things needed for daily living?: No Resource referrals recommended: Neuropsychology  Discharge Planning Living Arrangements: Alone Support Systems: Other relatives, Parent Type of Residence: Private residence Insurance Resources: Media planner (specify) Financial Resources: Employment Financial Screen Referred: No Living Expenses:  Psychologist, sport and exercise Management: Patient Does the patient have any problems obtaining your medications?: No Home Management: Independent Patient/Family Preliminary Plans: Plans to continue to manage Care Coordinator Barriers to Discharge: Lack of/limited family support, Decreased caregiver support Care Coordinator  Anticipated Follow Up Needs: HH/OP Expected length of stay: 5-7 Days  Clinical Impression SW met with patient, introduced self and explained role. Patient plans to remain independent. If patient requires supervision he will arrange assistance from his mother. 1 level home, level entry. No DME. SW will continue to FU with updates.  Andria Rhein 03/11/2023, 1:32 PM

## 2023-03-11 NOTE — Progress Notes (Signed)
Physical Therapy Session Note  Patient Details  Name: Bradley Davis MRN: [id number] Date of Birth: [birth year]  Today's Date: 03/11/2023 PT Individual Time: 1104-1200 PT Individual Time Calculation (min): 56 min   Short Term Goals: Week 1:  PT Short Term Goal 1 (Week 1): Pt will perform rise to stand with overall supervision and decreased apprehension and decreased R lateral bias. PT Short Term Goal 2 (Week 1): Pt will ambulate >242ft with <50% LOB to R requiring no more than CGA to maintain. PT Short Term Goal 3 (Week 1): Pt will perform stair negotiation with no UE support and overall supervision/ light CGA. PT Short Term Goal 4 (Week 1): Pt will relate decrease in R sided bias with improved midline orientation.  Skilled Therapeutic Interventions/Progress Updates:  Patient seated upright in recliner on entrance to room. Patient alert and agreeable to PT session.   Patient with no pain complaint at start of session.  Therapeutic Activity: Transfers: Pt performed sit<>stand and stand pivot transfers throughout session with close supervision. Pt with slow rise to stand throughout and no LOB. Very focused on R sided bias and effort to maintain midline.   Provided verbal cues for midline orientation.  Gait Training/ Neuromuscular Re-ed:  Pt ambulated 130 ft using no AD with light CGA. Demonstrated intermittent lateal lean toward R throughout. After NMR for return trip to room over 160 ft, pt with improved # of R lean until nearing end of amb bout and nearing room. Provided vc/ tc for maintaining focus and increased awareness of pressure throughout medial aspects of R heel and 1st metatarsal head for improved balance.   NMR facilitated during session with focus on dynamic balance during gait and balance activity. Pt guided in ambulation through agility ladder: One step into each square out and back x6 Alternating step with progression into next square after controlled circling of swing LE  around cone x4  Pt demos good, slow movements with focus on performance throughout. Provided with real-time KP throughout then KR as well as questioned re: self understanding of performance.     Demos improved performance during step through with education provided on shift of balance from back foot to front foot, attaining balance in SLS prior to lift of back foot in order to advance with control bilaterally. Several missteps during first pass with focus to complete with no missteps throughout session. Final pass with one minor misstep for balance.   Continued progression with addition of Le movement out to side and requirement to incorporate add'l lateral weight shift/ movement requiring controlled swing of leg around cone. Improving balance  and performance throughout with fewer LOB and final pass with one minor misstep. KP and KR provided throughout for improved cognitive retraining.   Improved balance throughout return ambulation as described above.   Patient seated upright in recliner at end of session with brakes locked, chair pad alarm set, and all needs within reach.   Therapy Documentation Precautions:  Precautions Precautions: Fall Precaution Comments: orthostatic hypotension, significant bias to R and mild to mod posterior, orders for abd binder when standing Restrictions Weight Bearing Restrictions: No  Pain:  No pain related this session.   Therapy/Group: Individual Therapy  Loel Dubonnet PT, DPT, CSRS 03/11/2023, 5:54 PM

## 2023-03-11 NOTE — Progress Notes (Signed)
   03/11/23 2117  BiPAP/CPAP/SIPAP  BiPAP/CPAP/SIPAP Pt Type Adult  BiPAP/CPAP/SIPAP  (home machine)  Patient Home Equipment Yes    Patient stated he would let RN know when he was ready to cut the machine on.

## 2023-03-11 NOTE — Plan of Care (Signed)
  Problem: RH Balance Goal: LTG Patient will maintain dynamic standing balance (PT) Description: LTG:  Patient will maintain dynamic standing balance with assistance during mobility activities (PT) Flowsheets (Taken 03/09/2023 1300) LTG: Pt will maintain dynamic standing balance during mobility activities with:: Supervision/Verbal cueing   Problem: Sit to Stand Goal: LTG:  Patient will perform sit to stand with assistance level (PT) Description: LTG:  Patient will perform sit to stand with assistance level (PT) Flowsheets (Taken 03/09/2023 1300) LTG: PT will perform sit to stand in preparation for functional mobility with assistance level: Independent   Problem: RH Bed Mobility Goal: LTG Patient will perform bed mobility with assist (PT) Description: LTG: Patient will perform bed mobility with assistance, with/without cues (PT). Flowsheets (Taken 03/09/2023 1300) LTG: Pt will perform bed mobility with assistance level of: Independent   Problem: RH Bed to Chair Transfers Goal: LTG Patient will perform bed/chair transfers w/assist (PT) Description: LTG: Patient will perform bed to chair transfers with assistance (PT). Flowsheets (Taken 03/09/2023 1300) LTG: Pt will perform Bed to Chair Transfers with assistance level: Supervision/Verbal cueing   Problem: RH Car Transfers Goal: LTG Patient will perform car transfers with assist (PT) Description: LTG: Patient will perform car transfers with assistance (PT). Flowsheets (Taken 03/09/2023 1300) LTG: Pt will perform car transfers with assist:: Supervision/Verbal cueing   Problem: RH Furniture Transfers Goal: LTG Patient will perform furniture transfers w/assist (OT/PT) Description: LTG: Patient will perform furniture transfers  with assistance (OT/PT). Flowsheets (Taken 03/09/2023 1300) LTG: Pt will perform furniture transfers with assist:: Supervision/Verbal cueing   Problem: RH Floor Transfers Goal: LTG Patient will perform floor transfers  w/assist (PT) Description: LTG: Patient will perform floor transfers with assistance (PT). Flowsheets (Taken 03/09/2023 1300) LTG: PT WILL PERFORM FLOOR TRANFERS  WITH  ASSIST:: Independent with assistive device    Problem: RH Ambulation Goal: LTG Patient will ambulate in controlled environment (PT) Description: LTG: Patient will ambulate in a controlled environment, # of feet with assistance (PT). Flowsheets (Taken 03/09/2023 1300) LTG: Pt will ambulate in controlled environ  assist needed:: Supervision/Verbal cueing LTG: Ambulation distance in controlled environment: more than 300 ft using no AD Goal: LTG Patient will ambulate in home environment (PT) Description: LTG: Patient will ambulate in home environment, # of feet with assistance (PT). Flowsheets (Taken 03/09/2023 1300) LTG: Pt will ambulate in home environ  assist needed:: Supervision/Verbal cueing LTG: Ambulation distance in home environment: up to 50 ft using no AD and no "furniture walking" Goal: LTG Patient will ambulate in community environment (PT) Description: LTG: Patient will ambulate in community environment, # of feet with assistance (PT). Flowsheets (Taken 03/09/2023 1300) LTG: Pt will ambulate in community environ  assist needed:: Supervision/Verbal cueing LTG: Ambulation distance in community environment: >500 ft over variable surfaces with no AD   Problem: RH Stairs Goal: LTG Patient will ambulate up and down stairs w/assist (PT) Description: LTG: Patient will ambulate up and down # of stairs with assistance (PT) Flowsheets (Taken 03/09/2023 1300) LTG: Pt will ambulate up/down stairs assist needed:: Independent with assistive device LTG: Pt will  ambulate up and down number of stairs: at least 4 steps using HR setup as per pt's immediate environment

## 2023-03-12 ENCOUNTER — Inpatient Hospital Stay (HOSPITAL_COMMUNITY): Payer: Medicaid Other

## 2023-03-12 DIAGNOSIS — I639 Cerebral infarction, unspecified: Secondary | ICD-10-CM | POA: Diagnosis not present

## 2023-03-12 LAB — CBC WITH DIFFERENTIAL/PLATELET
Abs Immature Granulocytes: 0.01 10*3/uL (ref 0.00–0.07)
Basophils Absolute: 0.1 10*3/uL (ref 0.0–0.1)
Basophils Relative: 1 %
Eosinophils Absolute: 0.3 10*3/uL (ref 0.0–0.5)
Eosinophils Relative: 4 %
HCT: 40.7 % (ref 39.0–52.0)
Hemoglobin: 13.6 g/dL (ref 13.0–17.0)
Immature Granulocytes: 0 %
Lymphocytes Relative: 33 %
Lymphs Abs: 2.1 10*3/uL (ref 0.7–4.0)
MCH: 27.7 pg (ref 26.0–34.0)
MCHC: 33.4 g/dL (ref 30.0–36.0)
MCV: 82.9 fL (ref 80.0–100.0)
Monocytes Absolute: 0.5 10*3/uL (ref 0.1–1.0)
Monocytes Relative: 8 %
Neutro Abs: 3.6 10*3/uL (ref 1.7–7.7)
Neutrophils Relative %: 54 %
Platelets: 370 10*3/uL (ref 150–400)
RBC: 4.91 MIL/uL (ref 4.22–5.81)
RDW: 12.8 % (ref 11.5–15.5)
WBC: 6.6 10*3/uL (ref 4.0–10.5)
nRBC: 0 % (ref 0.0–0.2)

## 2023-03-12 LAB — GLUCOSE, CAPILLARY
Glucose-Capillary: 174 mg/dL — ABNORMAL HIGH (ref 70–99)
Glucose-Capillary: 248 mg/dL — ABNORMAL HIGH (ref 70–99)
Glucose-Capillary: 117 mg/dL — ABNORMAL HIGH (ref 70–99)
Glucose-Capillary: 167 mg/dL — ABNORMAL HIGH (ref 70–99)

## 2023-03-12 LAB — COMPREHENSIVE METABOLIC PANEL
ALT: 32 U/L (ref 0–44)
AST: 39 U/L (ref 15–41)
Albumin: 2.8 g/dL — ABNORMAL LOW (ref 3.5–5.0)
Alkaline Phosphatase: 74 U/L (ref 38–126)
Anion gap: 12 (ref 5–15)
BUN: 22 mg/dL — ABNORMAL HIGH (ref 6–20)
CO2: 22 mmol/L (ref 22–32)
Calcium: 9.4 mg/dL (ref 8.9–10.3)
Chloride: 104 mmol/L (ref 98–111)
Creatinine, Ser: 3.06 mg/dL — ABNORMAL HIGH (ref 0.61–1.24)
GFR, Estimated: 26 mL/min — ABNORMAL LOW (ref >60)
Glucose, Bld: 147 mg/dL — ABNORMAL HIGH (ref 70–99)
Potassium: 4.1 mmol/L (ref 3.5–5.1)
Sodium: 138 mmol/L (ref 135–145)
Total Bilirubin: 0.5 mg/dL (ref 0.3–1.2)
Total Protein: 6.4 g/dL — ABNORMAL LOW (ref 6.5–8.1)

## 2023-03-12 MED ORDER — PANTOPRAZOLE SODIUM 40 MG PO TBEC: 40.0000 mg | DELAYED_RELEASE_TABLET | Freq: Every day | ORAL | Status: DC

## 2023-03-12 MED ORDER — PANTOPRAZOLE SODIUM 40 MG PO TBEC
40.0000 mg | DELAYED_RELEASE_TABLET | Freq: Every day | ORAL | Status: DC
  Administered 2023-03-13 – 2023-03-18 (×6): 40 mg via ORAL
  Filled 2023-03-12 (×7): qty 1

## 2023-03-12 MED ORDER — POTASSIUM CHLORIDE IN NACL 20-0.9 MEQ/L-% IV SOLN
INTRAVENOUS | Status: AC
  Filled 2023-03-12: qty 1000

## 2023-03-12 MED ORDER — PANTOPRAZOLE SODIUM 40 MG IV SOLR
40.0000 mg | Freq: Two times a day (BID) | INTRAVENOUS | Status: AC
  Administered 2023-03-12: 40 mg via INTRAVENOUS
  Filled 2023-03-12 (×2): qty 10

## 2023-03-12 MED ORDER — SODIUM CHLORIDE 0.9 % IV SOLN
INTRAVENOUS | Status: DC
Start: 2023-03-12 — End: 2023-03-12

## 2023-03-12 NOTE — Progress Notes (Addendum)
Occupational Therapy Session Note  Patient Details  Name: Bradley Davis MRN: [id number] Date of Birth: [birth year]  Today's Date: 03/12/2023 OT Individual Time: 1410-1435 OT Individual Time Calculation (min): 25 min    Short Term Goals: Week 1:  OT Short Term Goal 1 (Week 1): STG = LTG 2/2 ELOS  Skilled Therapeutic Interventions/Progress Updates:  Skilled OT intervention completed with focus on ambulatory transfers, toileting needs and myofascial release. Pt received in Rt side lying, with NT assessing vitals. Pt reported nausea with recent vomiting, but agreeable to light activity. No pain reported.  Pt transitioned to EOB independently, however with eyes closed, with pt recalling visual fixation technique but did not utilize. CGA sit > stand and ambulatory transfer without AD > toilet. Able to manage standing void with distant supervision- pt holding onto grab bar in stance. Ambulated to sink, brushed teeth with close supervision for balance however pt leaning on sink to compensate. Did endorse symptoms when bending forward from stance to rinse mouth; education provided on modified strategies such as seated to reduce positional change dizziness and to increase safety with ADLs. Ambulated with CGA > standard chair.  Pt reported stiffness in his neck from lying in a bed long hours every day. While seated, OT completed myofascial release and manual stretching to cervical and B trapezius regions to decrease fascial restrictions and increase joint mobility in a pain free zone. Palpated max fascial restrictions in B trapezius muscles and we discussed theracane, heat and stretches for reducing the restrictions. Transferred via stand pivot and CGA > EOB.   Pt remained in Rt sidelying, with bed alarm on/activated, and with all needs in reach at end of session.   Therapy Documentation Precautions:  Precautions Precautions: Fall Precaution Comments: orthostatic hypotension, significant bias to R and  mild to mod posterior, orders for abd binder when standing Restrictions Weight Bearing Restrictions: No    Therapy/Group: Individual Therapy  Melvyn Novas, MS, OTR/L  03/12/2023, 3:07 PM

## 2023-03-12 NOTE — Progress Notes (Signed)
Inpatient Rehabilitation  Patient information reviewed and entered into eRehab system by Kelly Gentry, OTR/L, Rehab Quality Coordinator.   Information including medical coding, functional ability and quality indicators will be reviewed and updated through discharge.   

## 2023-03-12 NOTE — Progress Notes (Signed)
Physical Therapy Session Note  Patient Details  Name: Bradley Davis MRN: [id number] Date of Birth: 1985/10/26  Today's Date: 03/12/2023 PT Missed Time: 60 Minutes Missed Time Reason: Patient ill (Comment) (patient nauseous and vomiting.)  Short Term Goals: Week 1:  PT Short Term Goal 1 (Week 1): Pt will perform rise to stand with overall supervision and decreased apprehension and decreased R lateral bias. PT Short Term Goal 2 (Week 1): Pt will ambulate >247ft with <50% LOB to R requiring no more than CGA to maintain. PT Short Term Goal 3 (Week 1): Pt will perform stair negotiation with no UE support and overall supervision/ light CGA. PT Short Term Goal 4 (Week 1): Pt will relate decrease in R sided bias with improved midline orientation.   Skilled Therapeutic Interventions/Progress Updates:   Pt sidelying to R side on entrance to room and sleeping. Easily roused but pt relates fatigue from the day and that he has been nauseous and vomiting again following the morning's sessions and worried about his stomach instability.   Staff relates that he is worried re: gastritis as he has had this before.    PA ordering scopolamine patch for nausea d/t n/v a common symptom with medullary strokes.   Pt missed of skilled therapy due to nausea/ vomiting. Will re-attempt as schedule and pt availability permits.   Therapy Documentation Precautions:  Precautions Precautions: Fall Precaution Comments: orthostatic hypotension, significant bias to R and mild to mod posterior, orders for abd binder when standing Restrictions Weight Bearing Restrictions: No General: PT Amount of Missed Time (min): 60 Minutes PT Missed Treatment Reason: Patient ill (Comment) (patient nauseous and vomiting.)  Pain:  No direct pain, but patient uncomfortable d/t n/v.  Therapy/Group: Individual Therapy  Loel Dubonnet PT, DPT, CSRS 03/12/2023, 4:45 PM

## 2023-03-12 NOTE — Progress Notes (Signed)
Patient has been nauseous today--unable to eat any food and thrown up 3 X--mostly liquid with few particles of food. No abdominal pain--last BM 2 days ago but goes every 2-3 days and no bloating or discomfort. Has had gastroenteritis before and feels that its recurred--has sausage last night. Has multiple containers of juice--none today. Discussed bland/liquid diet, will add IV PPI for 24 hours. Will check labs also.   Vitals ordered --has significant orthostatic changes Lying 162/103/HR 78-->148/97/HR 90 seated and standing 119/94/HR 90. Encouraged fluid intake to stay hydrated. Also discussed N/V common with medullary stroke--could trial scopolamine patch is symptoms do not improve.

## 2023-03-12 NOTE — Progress Notes (Signed)
Occupational Therapy Session Note  Patient Details  Name: Bradley Davis MRN: [id number] Date of Birth: 17-Aug-1985  Today's Date: 03/12/2023 OT Individual Time: 4098-1191 OT Individual Time Calculation (min): 60 min    Short Term Goals: Week 1:  OT Short Term Goal 1 (Week 1): STG = LTG 2/2 ELOS  Skilled Therapeutic Interventions/Progress Updates:    Pt received in bed stating he felt somewhat better after feeling nauseated this am.  Pt eager to brush teeth, pt able to get out of bed and stand at sink to brush teeth. He sat to rest briefly as he was feeling tired this am.  Ambulated to dresser to retrieve clothing from back packs. Pt had slight ant/posterior waver. Recommended he stand with feet slightly staggered to allow for more stability.   He ambulated to shower bench to undress and then showered with distant supervision using shower seat.  Moved to chair in bathroom to dress. Pt stood to don underwear using one hand on grab bar for support, but he was not fully steady. Recommended he sit to don pants over feet which he did.  Pt stated he felt tired after shower.  Ambulated to bed with supervision. Blood pressure 136/90.   Education on energy conservation and how to slowly increase activity levels attending to fatigue and dizziness levels.  Pt not dizzy this session. Did not use abdominal binder or TED hose.  Pt chose to sit in bed to try to eat a small amount of breakfast.     Pt resting in bed with all needs met. Alarm set and call light in reach.     Therapy Documentation Precautions:  Precautions Precautions: Fall Precaution Comments: orthostatic hypotension, significant bias to R and mild to mod posterior, orders for abd binder when standing Restrictions Weight Bearing Restrictions: No      Pain: Pain Assessment Pain Scale: 0-10 Pain Score: 0-No pain ADL: ADL Eating: Independent Where Assessed-Eating: Chair Grooming: Independent Where Assessed-Grooming: Standing at  sink Upper Body Bathing: Independent Where Assessed-Upper Body Bathing: Shower Lower Body Bathing: Supervision/safety Where Assessed-Lower Body Bathing: Shower Upper Body Dressing: Independent Where Assessed-Upper Body Dressing: Chair Lower Body Dressing: Supervision/safety Where Assessed-Lower Body Dressing: Chair Toileting: Supervision/safety Where Assessed-Toileting: Toilet (in stance) Toilet Transfer: Close supervision Toilet Transfer Method: Proofreader: Chiropractor Transfer: Close supervison Web designer Method: Ship broker: Insurance underwriter: Close supervision Film/video editor Method: Designer, industrial/product: Shower seat with back   Therapy/Group: Individual Therapy  Jeneva Schweizer 03/12/2023, 9:42 AM

## 2023-03-12 NOTE — Progress Notes (Signed)
At 0615, throwing up yellow liquid, approximately 500cc's. PRN compazine IV given at 0621. 1 more episode of emesis after compazine given. Reports N&V aren't new. May need PRN PO LOC. Alfredo Martinez A

## 2023-03-12 NOTE — Progress Notes (Signed)
Physical Therapy Session Note  Patient Details  Name: Bradley Davis MRN: [id number] Date of Birth: 1986/07/04  Today's Date: 03/12/2023 PT Individual Time: 1010-1120 PT Individual Time Calculation (min): 70 min   Short Term Goals: Week 1:  PT Short Term Goal 1 (Week 1): Pt will perform rise to stand with overall supervision and decreased apprehension and decreased R lateral bias. PT Short Term Goal 2 (Week 1): Pt will ambulate >252ft with <50% LOB to R requiring no more than CGA to maintain. PT Short Term Goal 3 (Week 1): Pt will perform stair negotiation with no UE support and overall supervision/ light CGA. PT Short Term Goal 4 (Week 1): Pt will relate decrease in R sided bias with improved midline orientation.  Skilled Therapeutic Interventions/Progress Updates: Patient supine in bed (HOB elevated) on entrance to room. Patient alert and agreeable to PT session.   Patient reported no pain, just fatigue this morning. Pt also stated having emetic event this morning (RN provided medication prior to start of therapy). Pt does not report current nausea.   Therapeutic Activity: Bed Mobility: Pt performed supine<>sit on EOB with supervision (HOB elevated). No VC required this session Transfers: Pt performed sit<>stand transfers throughout session with no AD and with supervision. No VC required this session.   - Pt reported neck pain from having to lay in the bed during hospital stay. PTA demos levator scapula stretch bilaterally, and for pt to perform 3 sets of 15-20 second holds during the day (pt demos understanding).   Gait Training:  Pt ambulated to bathroom from EOB with CGA to void bladder using no AD.  Pt then ambulated to sink to wash hands, and then to the day room gym with CGA/ close supervision. Pt with imbalance/slight LOB to the R, but was able to catch self with light minA from PTA. Pt also cued to focus on vertical lines in hallway to orient self in upright position to increase  balance (pt noted to report feeling "a little steadier." Pt presented with decreased stance time on L LE and required cues on the way back to room to increase step length (mod cues) with CGA for safety. Pt also with wide BOS (pt cannot recall if this was his baseline prior to admission).   Neuromuscular Re-ed: NMR facilitated during session with focus on VOR and balance during ambulation. - Seated VOR (sticky note on blank wall with black dot at eye level). Pt provided with demo and instructions on how to perform. Pt to perform intervention for 1 minute on first trial at 100 bpm (metronome app). Pt reported 4-5/10 after 20 seconds (pt rested with deep breaths until symptoms resolved - dizziness). Pt then performed 2 min at 80bpm and stayed 2-3/10 throughout with mod cues to stay on rhythm and to perform bigger head rotations. Pt performed 2nd trial of 2 minutes at 90 bpm with no reports of symptoms (pt with min cues to follow beat). Pt noted to have corrective saccades on R throughout. - Standing VOR with same sticky at eye level. Pt at 100 bpm with 4-5/10 of symptoms reported at end of 1 minute trial (decreased after seated rest). Pt then at 115 bpm for 2 minutes and symptoms at 4-5/10 throughout. Pt required increased cues to maintain rhythm with greater head rotations. Corrective saccades noted.  - VOR cancellation in standing with soccer ball and sticky note with black dot. Pt performed with symptoms staying below 3/10. - ambulation around day room and nsg loop  with minA and no AD. Pt cued to follow commands of multidirectional head rotations without stopping, and to keep head forward ahead with cues to follow various objects on the wall with eyes only. Pt reported 4/5 dizziness. And Pt performed 2 rounds with minA throughout  NMR performed for improvements in motor control and coordination, balance, sequencing, judgement, and self confidence/ efficacy in performing all aspects of mobility at highest  level of independence.   Patient R sidelying in bed at end of session with brakes locked, bed alarm set, and all needs within reach.      Therapy Documentation Precautions:  Precautions Precautions: Fall Precaution Comments: orthostatic hypotension, significant bias to R and mild to mod posterior, orders for abd binder when standing Restrictions Weight Bearing Restrictions: No  Therapy/Group: Individual Therapy  Marylee Belzer PTA 03/12/2023, 1:09 PM

## 2023-03-12 NOTE — Progress Notes (Signed)
Slept good. LBM 08/30, declines PRN LOC. Took scheduled miralax yesterday morning and Senna S 2 times daily. Abdomen slightly distended with hypoactive BS x 4 quads. Bradley Davis A

## 2023-03-12 NOTE — Progress Notes (Signed)
PROGRESS NOTE   Subjective/Complaints:  Intermittent nausea occ vomiting.  Onset since CVA.  Also has freq hiccups.  Discussed medical management  No BM x 2 d , no abd pain   ROS: as per HPI. Denies CP, SOB, abd pain, N/V/D, or any other complaints at this time.    Objective:   No results found. Recent Labs    03/11/23 1741  WBC 6.9  HGB 12.4*  HCT 36.8*  PLT 320   Recent Labs    03/11/23 1741  NA 135  K 3.9  CL 106  CO2 21*  GLUCOSE 118*  BUN 22*  CREATININE 2.85*  CALCIUM 8.6*        Intake/Output Summary (Last 24 hours) at 03/12/2023 0845 Last data filed at 03/11/2023 2300 Gross per 24 hour  Intake 480 ml  Output --  Net 480 ml        Physical Exam: Vital Signs Blood pressure (!) 154/95, pulse 90, temperature 98.6 F (37 C), temperature source Oral, resp. rate 17, height 5\' 10"  (1.778 m), weight 117.8 kg, SpO2 98%.   General: No acute distress Mood and affect are appropriate Heart: Regular rate and rhythm no rubs murmurs or extra sounds Lungs: Clear to auscultation, breathing unlabored, no rales or wheezes Abdomen: Positive bowel sounds, soft nontender to palpation, nondistended Extremities: No clubbing, cyanosis, or edema Skin: No evidence of breakdown, no evidence of rash   PRIOR EXAM: Neurologic exam:  Cognition: AAO to person, place, time and event.   Insight: Good insight into current condition.  Mood: Pleasant affect, appropriate mood.    CN: 2-12 grossly intact.  Coordination: no evidence of dysmetiria Intact fine motor finger to thumb  Spasticity: MAS 0 in all extremities.  Strength:                RUE: 5/5 SA, 5/5 EF, 5/5 EE, 5/5 WE, 5/5 FF, 5/5 FA                 LUE: 5/5 SA, 5/5 EF, 5/5 EE, 5/5 WE, 5/5 FF, 5/5 FA                 RLE: 5/5 HF, 5/5 KE, 5/5 DF, 5/5 EHL, 5/5 PF                 LLE:  5/5 HF, 5/5 KE, 5/5 DF, 5/5 EHL, 5/5 PF   Assessment/Plan: 1. Functional  deficits which require 3+ hours per day of interdisciplinary therapy in a comprehensive inpatient rehab setting. Physiatrist is providing close team supervision and 24 hour management of active medical problems listed below. Physiatrist and rehab team continue to assess barriers to discharge/monitor patient progress toward functional and medical goals  Care Tool:  Bathing    Body parts bathed by patient: Right arm, Left arm, Chest, Abdomen, Front perineal area, Buttocks, Right upper leg, Left upper leg, Right lower leg, Left lower leg, Face         Bathing assist Assist Level: Supervision/Verbal cueing     Upper Body Dressing/Undressing Upper body dressing   What is the patient wearing?: Pull over shirt    Upper body assist Assist Level:  Set up assist    Lower Body Dressing/Undressing Lower body dressing      What is the patient wearing?: Underwear/pull up, Pants     Lower body assist Assist for lower body dressing: Supervision/Verbal cueing     Toileting Toileting    Toileting assist Assist for toileting: Supervision/Verbal cueing     Transfers Chair/bed transfer  Transfers assist     Chair/bed transfer assist level: Contact Guard/Touching assist     Locomotion Ambulation   Ambulation assist      Assist level: Minimal Assistance - Patient > 75% Assistive device: No Device Max distance: 200 ft   Walk 10 feet activity   Assist     Assist level: Supervision/Verbal cueing Assistive device: No Device   Walk 50 feet activity   Assist    Assist level: Minimal Assistance - Patient > 75% Assistive device: No Device    Walk 150 feet activity   Assist    Assist level: Minimal Assistance - Patient > 75% Assistive device: No Device    Walk 10 feet on uneven surface  activity   Assist Walk 10 feet on uneven surfaces activity did not occur: Safety/medical concerns         Wheelchair     Assist Is the patient using a wheelchair?:  No   Wheelchair activity did not occur: N/A         Wheelchair 50 feet with 2 turns activity    Assist    Wheelchair 50 feet with 2 turns activity did not occur: N/A       Wheelchair 150 feet activity     Assist  Wheelchair 150 feet activity did not occur: N/A       Blood pressure (!) 154/95, pulse 90, temperature 98.6 F (37 C), temperature source Oral, resp. rate 17, height 5\' 10"  (1.778 m), weight 117.8 kg, SpO2 98%.  Medical Problem List and Plan: 1. 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  Antithrombotics: -DVT/anticoagulation:  Pharmaceutical: Lovenox added 40mg  daily             -antiplatelet therapy:  DAPT X 3 weeks followed by ASA alone 3. Right sided headaches/Pain Management:  tylenol prn. Using 650mg  BID on average   4. Mood/Behavior/Sleep: LCSW to follow for evaluation and support.              -antipsychotic agents: N/A 5. Neuropsych/cognition: This patient is capable of making decisions on his own behalf. 6. Skin/Wound Care: Routine pressure relief measures.  7. Fluids/Electrolytes/Nutrition: Monitor I/O. Check CMET in am.  8. HTN: Monitor BP QID--continue hydralazine 100mg  q8h, amlodipine 10mg  daily, and labetalol 300mg  q8h.              --was on amlodipine and hydrochlorothiazide PTA.             --supine HTN recorded and contributing to dizziness. Encourage fluid intake --Lying SBP 162/103 w/P-78--> Sitting SBP 119/94 w/P-90. Orthostatic vitals ordered. --Wears TEDs for chronic peripheral edema. Will order binder for use (question autonomic dysfunction)  -8/31-9/1 BPs a little elevated, will see what trend is before adjusting meds, may need hydrochlorothiazide added back? Vitals:   03/09/23 1917 03/10/23 0447 03/10/23 0643 03/10/23 0647  BP: (!) 157/75 (!) 155/98 (!) 167/102 130/82   03/10/23 1478 03/10/23  1511 03/10/23 1928  03/10/23 1930  BP: (!) 162/92 (!) 176/91 (!) 185/96 (!) 189/94   03/10/23 1932 03/11/23 1600 03/11/23 2058 03/12/23 0452  BP: (!) 188/91 (!) 156/92 (!) 158/99 (!) 154/95  Still elevated  on max dose amlodipine , hydralazine and labetolol, no diuretics due to elevated BUN and Creat , will trial clonidine - improving   9. T1DM: Hgb A1C-13.8 and poorly controlled due to lack of insulin pump/insurance issues.  --was on Lantus 40 units once a day w/counting carbs-BS running 300s for a while --Now on Levmir 20 units BID with novolog 2 units ac TID  --Will monitor BS ac/hs and use SSI for elevated BS and titrate as indicated -8/31-9/1 CBGs looking much better; monitor closely CBG (last 3)  Recent Labs    03/11/23 1636 03/11/23 2119 03/12/23 0618  GLUCAP 95 149* 174*   Increase semglee to 22U , may need to d/c meal coverage  10. CKD: SCr- 1.94 (02/2022). Now BUN/SCr 48/3.31---> 30/3.07              --on Sodium bicarb TID for NAGMA-  Improving slowly     Latest Ref Rng & Units 03/11/2023    5:41 PM 03/09/2023    6:15 AM 03/06/2023    9:15 AM  BMP  Glucose 70 - 99 mg/dL 161  096  045   BUN 6 - 20 mg/dL 22  26  30    Creatinine 0.61 - 1.24 mg/dL 4.09  8.11  9.14   Sodium 135 - 145 mmol/L 135  138  138   Potassium 3.5 - 5.1 mmol/L 3.9  3.8  3.8   Chloride 98 - 111 mmol/L 106  108  105   CO2 22 - 32 mmol/L 21  23  19    Calcium 8.9 - 10.3 mg/dL 8.6  8.6  8.7     11. Hyperlipidemia: LDL-130 (was 396 a year ago). Continue atorvastatin 80mg  daily --Leqvio prior authorization started on acute.  12. Dizziness/Orthostasis: Vestibular evaluation and treat. Denies dizziness, double vision or nausea. Continue PRN meclizine and compazine -03/10/23 per PT Gaye Pollack, negative for BPPV, +orthostasis when standing; has TEDs; advised increased water intake. Monitor 13. Constipation: Being managed with Senna BID and Miralax daily -03/10/23 LBM , has been taking senna BID  14. Anemia of chronic disease?: Hgb 12.5  @ admission. Recheck 03/09/23 showing 12.8; monitor on routine labs  15. OSA: Has been using CPAP at nights.    LOS: 4 days A FACE TO FACE EVALUATION WAS PERFORMED  Erick Colace 03/12/2023, 8:45 AM

## 2023-03-13 DIAGNOSIS — I639 Cerebral infarction, unspecified: Secondary | ICD-10-CM | POA: Diagnosis not present

## 2023-03-13 LAB — GLUCOSE, CAPILLARY
Glucose-Capillary: 141 mg/dL — ABNORMAL HIGH (ref 70–99)
Glucose-Capillary: 163 mg/dL — ABNORMAL HIGH (ref 70–99)
Glucose-Capillary: 188 mg/dL — ABNORMAL HIGH (ref 70–99)
Glucose-Capillary: 192 mg/dL — ABNORMAL HIGH (ref 70–99)

## 2023-03-13 MED ORDER — CLONIDINE HCL 0.1 MG PO TABS
0.1000 mg | ORAL_TABLET | Freq: Three times a day (TID) | ORAL | Status: DC
  Administered 2023-03-13 – 2023-03-15 (×6): 0.1 mg via ORAL
  Filled 2023-03-13 (×6): qty 1

## 2023-03-13 MED ORDER — SORBITOL 70 % SOLN
30.0000 mL | Freq: Once | Status: AC
  Administered 2023-03-13: 30 mL via ORAL
  Filled 2023-03-13: qty 30

## 2023-03-13 NOTE — Progress Notes (Signed)
Occupational Therapy Session Note  Patient Details  Name: Bradley Davis MRN: [id number] Date of Birth: Jul 13, 1985  Today's Date: 03/13/2023 OT Individual Time: 7846-9629 OT Individual Time Calculation (min): 45 min    Short Term Goals: Week 1:  OT Short Term Goal 1 (Week 1): STG = LTG 2/2 ELOS  Skilled Therapeutic Interventions/Progress Updates:    Pt received in bed stating he is feeling much better this am. Pt opted to start therapy and not shower today. He did want to toilet and brush teeth. After pt came to stand, had pt do an overhead arm stretch to lengthen his body out. With arms overhead he had an anterior lean requiring guarding A.  He then stood at sink to complete oral care independently and ambulated to toilet to toilet with mod I.  Pt ambulated to main gym.  He had a few occasional anterior LOB with guarding A.  Pt commented that his legs felt so tired from trying to keep his balance. In gym, sat on mat to focus on dynamic balance with core and LE strength.  Using a 5 lb dowel bar, had pt work on slow modified deadlifts to an overhead reach.  He did well with this activity and was able to hold balance without a forward lean for 10-12 reps.  Added in slow eccentric squats.  Then had pt work on twisting bar to alt sides with mini squat. This was much more difficult and he needed guarding A.  Modified exercise to standing with soft knees and rotating bar side to side.  Pt will need to continue to work on dynamic reach across midline.   To exercise arms and legs, had pt sit in his w/c to self propel back to room using legs also. Halfway back to room pt stated his legs started cramping. He rested briefly and then continued.  Pt resting in w/c in room with all needs met.  Pt understands his PT will be coming in about 40 minutes and to stay seated.  Pt has good cognition and goals are for him to be mod I.    Therapy Documentation Precautions:  Precautions Precautions: Fall Precaution  Comments: orthostatic hypotension, significant bias to R and mild to mod posterior, orders for abd binder when standing Restrictions Weight Bearing Restrictions: No    Pain: no c/o pain    ADL: ADL Eating: Independent Where Assessed-Eating: Chair Grooming: Independent Where Assessed-Grooming: Standing at sink Upper Body Bathing: Independent Where Assessed-Upper Body Bathing: Shower Lower Body Bathing: Supervision/safety Where Assessed-Lower Body Bathing: Shower Upper Body Dressing: Independent Where Assessed-Upper Body Dressing: Chair Lower Body Dressing: Supervision/safety Where Assessed-Lower Body Dressing: Chair Toileting: Modified independent Where Assessed-Toileting: Toilet (in stance) Toilet Transfer: Distant supervision Toilet Transfer Method: Proofreader: Chiropractor Transfer: Close supervison Web designer Method: Ship broker: Insurance underwriter: Close supervision Film/video editor Method: Designer, industrial/product: Shower seat with back       Therapy/Group: Individual Therapy  [name] 03/13/2023, 9:53 AM

## 2023-03-13 NOTE — Progress Notes (Signed)
Occupational Therapy Session Note  Patient Details  Name: Bradley Davis MRN: [id number] Date of Birth: 1986-04-21  Today's Date: 03/13/2023 OT Individual Time: 1303-1404 OT Individual Time Calculation (min): 61 min    Short Term Goals: Week 1:  OT Short Term Goal 1 (Week 1): STG = LTG 2/2 ELOS  Skilled Therapeutic Interventions/Progress Updates:  Pt greeted seated in w/c, pt agreeable to OT intervention. Pt endorses fatigue. Donned teds with total A for time mgmt, pt able to don shoes with set- up assist. Assessed VS as indicated below:   Sitting- 124/77 Standing- 101/63  Pt completed w/c mobility down hallway using BUEs and feet to propel chair ~ 100 ft before needing to be transported remainder of distance d/t fatigue. Pt transported outside to increase pt buy in and improve overall mood /affect.  Pt completed below therex with 3.3 lb weighted ball from sitting outside in w/c: X20 OH presses X20 chest presses X20 bicep curls  X20 internal exteral shouleder rotation holding ball out at chest at shoulder height  Pt completed below standing therex to simulate working in pts food trunk. Pt instructed to stand at "counter" to move 4 lb weights from front of wall>back wall to simulate moving heavy items around in food truck, pt completed task with CGA. Pt stood to complete standing lateral leg raises to challenge dynamic balance and increase glute strength.   Pt transported back to room with total A, pt completed ambulatory toilet tranfser with no AD and supervision, pt with continent urine void. Ended session with pt seated EOB with all needs within reach.   Therapy Documentation Precautions:  Precautions Precautions: Fall Precaution Comments: orthostatic hypotension, significant bias to R and mild to mod posterior, orders for abd binder when standing Restrictions Weight Bearing Restrictions: No   Pain: no pain     Therapy/Group: Individual Therapy  Barron Schmid 03/13/2023, 3:41 PM

## 2023-03-13 NOTE — Plan of Care (Signed)
  Problem: Consults Goal: RH STROKE PATIENT EDUCATION Description: See Patient Education module for education specifics  Outcome: Progressing   Problem: RH BOWEL ELIMINATION Goal: RH STG MANAGE BOWEL WITH ASSISTANCE Description: STG Manage Bowel with  mod I Assistance. Outcome: Progressing Goal: RH STG MANAGE BOWEL W/MEDICATION W/ASSISTANCE Description: STG Manage Bowel with Medication with mod I  Assistance. Outcome: Progressing   Problem: RH KNOWLEDGE DEFICIT Goal: RH STG INCREASE KNOWLEDGE OF DIABETES Description: Patient will be able to manage DM with medication and dietary modification using educational resources independently Outcome: Progressing Goal: RH STG INCREASE KNOWLEDGE OF HYPERTENSION Description: Patient will be able to manage HTN with medication and dietary modification using educational resources independently Outcome: Progressing Goal: RH STG INCREASE KNOWLEGDE OF HYPERLIPIDEMIA Description: Patient will be able to manage HLD with medication and dietary modification using educational resources independently Outcome: Progressing Goal: RH STG INCREASE KNOWLEDGE OF STROKE PROPHYLAXIS Description: Patient will be able to manage secondary stroke risks with medication and dietary modification using educational resources independently Outcome: Progressing

## 2023-03-13 NOTE — Progress Notes (Addendum)
PROGRESS NOTE   Subjective/Complaints:  KUB showing moderate stool , vomited undigested food yesterday about noon , no further episodes , discussed result , LBM 9/1  Per OT +LOB with overhead reach   ROS: as per HPI. Denies CP, SOB, abd pain, N/V/D, or any other complaints at this time.    Objective:   DG Abd 1 View  Result Date: 03/12/2023 CLINICAL DATA:  Nausea and vomiting. EXAM: ABDOMEN - 1 VIEW COMPARISON:  Radiograph 03/01/2023 FINDINGS: Moderate stool in the transverse, descending and sigmoid colon. Stool balls in the rectum. Single prominent small bowel loop in the right abdomen measuring up to 3.8 cm. No evidence of free air. No radiopaque calculi. No acute osseous findings. IMPRESSION: 1. Moderate stool in the colon suggesting constipation. Stool balls in the rectum. 2. Single prominent small bowel loop in the right abdomen, nonspecific. This may be related to ileus. Electronically Signed   By: Narda Rutherford M.D.   On: 03/12/2023 21:39   Recent Labs    03/11/23 1741 03/12/23 1508  WBC 6.9 6.6  HGB 12.4* 13.6  HCT 36.8* 40.7  PLT 320 370   Recent Labs    03/11/23 1741 03/12/23 1508  NA 135 138  K 3.9 4.1  CL 106 104  CO2 21* 22  GLUCOSE 118* 147*  BUN 22* 22*  CREATININE 2.85* 3.06*  CALCIUM 8.6* 9.4        Intake/Output Summary (Last 24 hours) at 03/13/2023 0841 Last data filed at 03/13/2023 0534 Gross per 24 hour  Intake 1019.15 ml  Output --  Net 1019.15 ml        Physical Exam: Vital Signs Blood pressure (!) 155/95, pulse 86, temperature 98.7 F (37.1 C), temperature source Oral, resp. rate 16, height 5\' 10"  (1.778 m), weight 117.8 kg, SpO2 98%.   General: No acute distress Mood and affect are appropriate Heart: Regular rate and rhythm no rubs murmurs or extra sounds Lungs: Clear to auscultation, breathing unlabored, no rales or wheezes Abdomen: Positive bowel sounds, soft nontender to  palpation, nondistended Extremities: No clubbing, cyanosis, or edema Skin: No evidence of breakdown, no evidence of rash   PRIOR EXAM: Neurologic exam:  Cognition: AAO to person, place, time and event.   Insight: Good insight into current condition.  Mood: Pleasant affect, appropriate mood.    CN: 2-12 grossly intact.  Coordination: no evidence of dysmetiria Intact fine motor finger to thumb  Spasticity: MAS 0 in all extremities.  Strength:                RUE: 5/5 SA, 5/5 EF, 5/5 EE, 5/5 WE, 5/5 FF, 5/5 FA                 LUE: 5/5 SA, 5/5 EF, 5/5 EE, 5/5 WE, 5/5 FF, 5/5 FA                 RLE: 5/5 HF, 5/5 KE, 5/5 DF, 5/5 EHL, 5/5 PF                 LLE:  5/5 HF, 5/5 KE, 5/5 DF, 5/5 EHL, 5/5 PF   Assessment/Plan: 1. Functional deficits which  require 3+ hours per day of interdisciplinary therapy in a comprehensive inpatient rehab setting. Physiatrist is providing close team supervision and 24 hour management of active medical problems listed below. Physiatrist and rehab team continue to assess barriers to discharge/monitor patient progress toward functional and medical goals  Care Tool:  Bathing    Body parts bathed by patient: Right arm, Left arm, Chest, Abdomen, Front perineal area, Buttocks, Right upper leg, Left upper leg, Right lower leg, Left lower leg, Face         Bathing assist Assist Level: Supervision/Verbal cueing     Upper Body Dressing/Undressing Upper body dressing   What is the patient wearing?: Pull over shirt    Upper body assist Assist Level: Independent    Lower Body Dressing/Undressing Lower body dressing      What is the patient wearing?: Underwear/pull up, Pants     Lower body assist Assist for lower body dressing: Supervision/Verbal cueing     Toileting Toileting    Toileting assist Assist for toileting: Supervision/Verbal cueing     Transfers Chair/bed transfer  Transfers assist     Chair/bed transfer assist level:  Supervision/Verbal cueing     Locomotion Ambulation   Ambulation assist      Assist level: Minimal Assistance - Patient > 75% Assistive device: No Device Max distance: 200 ft   Walk 10 feet activity   Assist     Assist level: Supervision/Verbal cueing Assistive device: No Device   Walk 50 feet activity   Assist    Assist level: Minimal Assistance - Patient > 75% Assistive device: No Device    Walk 150 feet activity   Assist    Assist level: Minimal Assistance - Patient > 75% Assistive device: No Device    Walk 10 feet on uneven surface  activity   Assist Walk 10 feet on uneven surfaces activity did not occur: Safety/medical concerns         Wheelchair     Assist Is the patient using a wheelchair?: No   Wheelchair activity did not occur: N/A         Wheelchair 50 feet with 2 turns activity    Assist    Wheelchair 50 feet with 2 turns activity did not occur: N/A       Wheelchair 150 feet activity     Assist  Wheelchair 150 feet activity did not occur: N/A       Blood pressure (!) 155/95, pulse 86, temperature 98.7 F (37.1 C), temperature source Oral, resp. rate 16, height 5\' 10"  (1.778 m), weight 117.8 kg, SpO2 98%.  Medical Problem List and Plan: 1. 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  Antithrombotics: -DVT/anticoagulation:  Pharmaceutical: Lovenox added 40mg  daily             -antiplatelet therapy:  DAPT X 3 weeks followed by ASA alone 3. Right sided headaches/Pain Management:  tylenol prn. Using 650mg  BID on average   4. Mood/Behavior/Sleep: LCSW to follow for evaluation and support.              -antipsychotic agents: N/A 5. Neuropsych/cognition: This patient is capable of making decisions on his own behalf. 6. Skin/Wound Care: Routine pressure relief measures.  7.  Fluids/Electrolytes/Nutrition: Monitor I/O. Check CMET in am.  8. HTN:  Monitor BP QID--continue hydralazine 100mg  q8h, amlodipine 10mg  daily, and labetalol 300mg  q8h.              --was on amlodipine and hydrochlorothiazide PTA.             --supine HTN recorded and contributing to dizziness. Encourage fluid intake --Lying SBP 162/103 w/P-78--> Sitting SBP 119/94 w/P-90. Orthostatic vitals ordered. --Wears TEDs for chronic peripheral edema. Will order binder for use (question autonomic dysfunction)  -8/31-9/1 BPs a little elevated, will see what trend is before adjusting meds, may need hydrochlorothiazide added back? Vitals:   03/10/23 1511 03/10/23 1928 03/10/23 1930 03/10/23 1932  BP: (!) 176/91 (!) 185/96 (!) 189/94 (!) 188/91   03/11/23 1600 03/11/23 2058 03/12/23 0452 03/12/23 1404  BP: (!) 156/92 (!) 158/99 (!) 154/95 103/74   03/12/23 1409 03/12/23 1412 03/12/23 1958 03/13/23 0424  BP: (!) 141/96 (!) 161/99 (!) 149/77 (!) 155/95  Still elevated  on max dose amlodipine , hydralazine and labetolol, no diuretics due to elevated BUN and Creat , will trial clonidine - improving   9. T1DM: Hgb A1C-13.8 and poorly controlled due to lack of insulin pump/insurance issues.  --was on Lantus 40 units once a day w/counting carbs-BS running 300s for a while --Now on Levmir 20 units BID with novolog 2 units ac TID  --Will monitor BS ac/hs and use SSI for elevated BS and titrate as indicated -8/31-9/1 CBGs looking much better; monitor closely CBG (last 3)  Recent Labs    03/12/23 1649 03/12/23 2043 03/13/23 0621  GLUCAP 117* 167* 141*   Increase semglee to 22U , try d/c meal coverage, keep SSI   10. CKD: SCr- 1.94 (02/2022). Now BUN/SCr 48/3.31---> 30/3.07              --on Sodium bicarb TID for NAGMA-  Improving slowly     Latest Ref Rng & Units 03/12/2023    3:08 PM 03/11/2023    5:41 PM 03/09/2023    6:15 AM  BMP  Glucose 70 - 99 mg/dL 409  811  914   BUN 6 - 20 mg/dL 22  22  26     Creatinine 0.61 - 1.24 mg/dL 7.82  9.56  2.13   Sodium 135 - 145 mmol/L 138  135  138   Potassium 3.5 - 5.1 mmol/L 4.1  3.9  3.8   Chloride 98 - 111 mmol/L 104  106  108   CO2 22 - 32 mmol/L 22  21  23    Calcium 8.9 - 10.3 mg/dL 9.4  8.6  8.6     11. Hyperlipidemia: LDL-130 (was 396 a year ago). Continue atorvastatin 80mg  daily --Leqvio prior authorization started on acute.  12. Dizziness/Orthostasis: Vestibular evaluation and treat. Denies dizziness, double vision or nausea. Continue PRN meclizine and compazine -03/10/23 per PT Gaye Pollack, negative for BPPV, +orthostasis when standing; has TEDs; advised increased water intake. Monitor 13. Constipation: Being managed with Senna BID and Miralax daily -03/10/23 LBM , has been taking senna BID - sorbitol today 9/4 14. Anemia of chronic disease, mild : Hgb 12.5 @ admission. Recheck 03/09/23 showing 12.8; monitor on routine labs  15. OSA: Has been using CPAP at nights.    LOS: 5 days A FACE TO FACE EVALUATION WAS PERFORMED  Erick Colace 03/13/2023, 8:41 AM

## 2023-03-13 NOTE — Progress Notes (Signed)
Orthopedic Tech Progress Note Patient Details:  AMIR VONBERGEN 08/05/1985 [id number]  Secretary called requesting an ABDOMINAL BINDER for patient   Ortho Devices Type of Ortho Device: Abdominal binder Ortho Device/Splint Location: STOMACH Ortho Device/Splint Interventions: Ordered   Post Interventions Patient Tolerated: Well Instructions Provided: Care of device  Donald Pore 03/13/2023, 12:14 PM

## 2023-03-13 NOTE — Progress Notes (Signed)
Patient ID: Bradley Davis, male   DOB: 04-18-1986, 37 y.o.   MRN: [id number]  Team Conference Report to Patient/Family  Team Conference discussion was reviewed with the patient and caregiver, including goals, any changes in plan of care and target discharge date.  Patient and caregiver express understanding and are in agreement.  The patient has a target discharge date of 03/19/23.  SW met with patient and provided team conference updates. Sw and patient discussed MOD I goals and tasks recommended to have assistance with upon returning home. Pt expressed he will be able to have his mother to assist with tasks inside the home. Patient has confirmed transportation for OP recommendation. Patient requesting to speak with diabetic coordinator. No additional questions or concerns.  Andria Rhein 03/13/2023, 1:41 PM

## 2023-03-13 NOTE — Progress Notes (Signed)
   03/12/23 2101  BiPAP/CPAP/SIPAP  BiPAP/CPAP/SIPAP Pt Type Adult  BiPAP/CPAP/SIPAP Resmed  Mask Type Nasal pillows  Patient Home Equipment Yes  Auto Titrate No  Safety Check Completed by RT for Home Unit Yes, no issues noted  MEWS Score/Color  MEWS Score 0  MEWS Score Color [place]

## 2023-03-13 NOTE — Progress Notes (Signed)
Physical Therapy Session Note  Patient Details  Name: Bradley Davis MRN: [id number] Date of Birth: 1986/02/23  Today's Date: 03/13/2023 PT Individual Time: 1000-1100; 1430 - 1525 PT Individual Time Calculation (min): 60 min ; 55 min  Short Term Goals: Week 1:  PT Short Term Goal 1 (Week 1): Pt will perform rise to stand with overall supervision and decreased apprehension and decreased R lateral bias. PT Short Term Goal 2 (Week 1): Pt will ambulate >275ft with <50% LOB to R requiring no more than CGA to maintain. PT Short Term Goal 3 (Week 1): Pt will perform stair negotiation with no UE support and overall supervision/ light CGA. PT Short Term Goal 4 (Week 1): Pt will relate decrease in R sided bias with improved midline orientation.  SESSION 1 Skilled Therapeutic Interventions/Progress Updates: Patient sitting in WC on entrance to room with blankets around trunk and B LE's. Patient alert and agreeable to PT session.   Patient reported no pain, only mental fogginess. Pt also reported increase in feeling cold and slight congestion. Pt also stated R side of jaw has pain 4-5/10 on entrance, and 3/10 midway through therapy. BP monitored in sitting and standing as indicated in "vitals" with pt reporting slight dizziness. RN notified of all of patient's presentation. Pt transported to and from ADL apartment for time management.   Therapeutic Activity: Bed Mobility: Pt performed supine<>sit on EOB with supervision. No VC required this session. Transfers: Pt performed sit<>stand transfers throughout session with supervision and no AD. Pt stands with wide BOS and does not recall if that is baseline prior to admission.  Neuromuscular Re-ed: NMR facilitated during session with focus on VOR and dynamic standing balance during job simulation. - VOR standing inititally starting at 115bpm as this was where pt ended yesterday during PT. Today, pt reported 5 /10 in increased dizziness after only 10 seconds.  Pt then started at 95bpm, then increased to 105 with 4-5/10 remaining consistent throughout. Pt then at 120bpm with 2-3/10 reported. Pt with notable difference in corrective saccades this session vs yesterday per presentation of decrease in correction (pt also in room performing this vs yesterday when pt was in the day room gym with moderate distractions).  - In ADL apartment kitchen with pt simulating being in side of a food truck with instructions to move plates, cups and other items from one side to the other for fast turns and coordination. Pt with CGA for safety, and denied dizziness throughout.   NMR performed for improvements in motor control and coordination, balance, sequencing, judgement, and self confidence/ efficacy in performing all aspects of mobility at highest level of independence.   Patient supine in bed at end of session with brakes locked, bed alarm set, and all needs within reach.  SESSON 2 Skilled Therapeutic Interventions/Progress Updates: Patient supine in bed on entrance to room. Patient alert and agreeable to PT session.   Patient reported no pain and feeling better since morning session. Pt feels that jaw pain came from sleeping in a way that caused it.   Therapeutic Activity: Bed Mobility: Pt performed supine<>sit on EOB with supervision.  Transfers: Pt performed sit<>stand transfers throughout session with supervision and no AD.   Gait Training:  Pt ambulated throughout session today using no AD with CGA/supervision. Pt provided with VC to increase WB on L LE by increasing step length on R LE as pt tends to lean to the R. Pt initiated previous VC of locating vertical lines while ambulating in  order to promote upright orientation during dynamic gait stating that it helps him feel more balanced.    Neuromuscular Re-ed: NMR facilitated during session with focus on dynamic standing balance during job simulation. - agility ladder with lateral steps both ways. Pt with  close supervision and VC to avoid touching plastic barriers, and to step wide enough to fit both feet, and to keep B LE's facing straight ahead. Pt progressed to airex pad beam with lateral steps (pt with minA for upright standing balance).  - ADL apartment kitchen  with multiple stations set up to simulate tight quarters in food truck. Pt started timed trials by stepping onto 8" step (with use of R UE on corner of wall as pt has a handle to use on the truck). Pt cued to make tacos with objects that are assigned certain foods/condiments with having to make turns, lateral steps and to maintain balance while PTA placed objects for pt to avoid, or leaving cabinets open for pt to have to make quick adjustments, or PTA bumping into pt to further simulate busy environment with little space. Pt timed for 2 trials finishing first in 1:45, then the second in 1:34. Pt performed intervention with no LOB and with close supervision.   NMR performed for improvements in motor control and coordination, balance, sequencing, judgement, and self confidence/ efficacy in performing all aspects of mobility at highest level of independence.   Patient supine in bed at end of session with brakes locked, bed alarm set, and all needs within reach.       Therapy Documentation Precautions:  Precautions Precautions: Fall Precaution Comments: orthostatic hypotension, significant bias to R and mild to mod posterior, orders for abd binder when standing Restrictions Weight Bearing Restrictions: No  Vitals (1st session) 141/90 sitting in WC 123/74 standing from WC  Therapy/Group: Individual Therapy  Earnstine Meinders PTA 03/13/2023, 4:14 PM

## 2023-03-14 DIAGNOSIS — I639 Cerebral infarction, unspecified: Secondary | ICD-10-CM | POA: Diagnosis not present

## 2023-03-14 LAB — GLUCOSE, CAPILLARY
Glucose-Capillary: 157 mg/dL — ABNORMAL HIGH (ref 70–99)
Glucose-Capillary: 179 mg/dL — ABNORMAL HIGH (ref 70–99)
Glucose-Capillary: 183 mg/dL — ABNORMAL HIGH (ref 70–99)
Glucose-Capillary: 263 mg/dL — ABNORMAL HIGH (ref 70–99)

## 2023-03-14 MED ORDER — POLYETHYLENE GLYCOL 3350 17 G PO PACK
34.0000 g | PACK | Freq: Once | ORAL | Status: AC
  Administered 2023-03-14: 34 g via ORAL
  Filled 2023-03-14: qty 2

## 2023-03-14 MED ORDER — SORBITOL 70 % SOLN
30.0000 mL | Freq: Once | Status: AC
  Administered 2023-03-14: 30 mL via ORAL
  Filled 2023-03-14: qty 30

## 2023-03-14 MED ORDER — INSULIN DETEMIR 100 UNIT/ML ~~LOC~~ SOLN
25.0000 [IU] | Freq: Two times a day (BID) | SUBCUTANEOUS | Status: DC
  Administered 2023-03-14 – 2023-03-18 (×8): 25 [IU] via SUBCUTANEOUS
  Filled 2023-03-14 (×10): qty 0.25

## 2023-03-14 NOTE — Progress Notes (Signed)
Patient ID: Bradley Davis, male   DOB: 25-Feb-1986, 37 y.o.   MRN: [id number]  OP referral submitted to [facility].

## 2023-03-14 NOTE — Patient Care Conference (Signed)
Inpatient RehabilitationTeam Conference and Plan of Care Update Date: 03/13/2023   Time: 10:13 AM    Patient Name: Bradley Davis      Medical Record Number: [id number]  Date of Birth: [birth year] Sex: Male         Room/Bed: 4W01C/4W01C-01 Payor Info: Payor: [place] MEDICAID PREPAID HEALTH PLAN / Plan: [place] MEDICAID [facility] / Product Type: *No Product type* /    Admit Date/Time:  03/08/2023  2:22 PM  Primary Diagnosis:  Cerebellar stroke [facility])  Hospital Problems: Principal Problem:   Cerebellar stroke [facility])    Expected Discharge Date: Expected Discharge Date: 03/19/23  Team Members Present: Physician leading conference: Dr. Claudette Laws Social Worker Present: Lavera Guise, BSW Nurse Present: Chana Bode, RN PT Present: Ralph Leyden, PT OT Present: Primitivo Gauze, OT SLP Present: Feliberto Gottron, SLP PPS Coordinator present : Fae Pippin, [place], PT     Current Status/Progress Goal Weekly Team Focus  Bowel/Bladder   Continent of bowel and bladder   Maintain continent of bowel and bladder   Assisting pt to use rest room    Swallow/Nutrition/ Hydration              ADL's   close supervision ADLs, CGA mobilty   Mod I with self care except for Sup with tub transfers and homemaking   balance, endurance to increase independence with ADLS    Mobility   Bed mobility = IND, Transfers = supervision to CGA/ MinA for LOB toR, ambulation = supervision to MinA for LOB, community distances without AD   IND to SUP for all mobility  Barriers: orthostsis, positional n/v /// Work on: Editor, commissioning, balance during ambulation, R hemibody NMR    Communication                Safety/Cognition/ Behavioral Observations               Pain   Denied pain   Maintain pain free   Continue assessing pain and given PRN meds as needed    Skin   Skin intact   Maintain skin intact  Continue checking for skin integrity      Discharge Planning:  Patient  anticpates discharging home at MOD I level. If requiring supervision patient plans to arrange with his mother and family to provide supervision.   Team Discussion: Patient with ataxia, anterior/posterior sway and loss of balance, nausea/vomiting,  and orthostatic hypotension post medullary CVA.  MD adjusting medications for DM; discontinued meal coverage.  Patient is progressing well overall.  Patient on target to meet rehab goals: yes, currently needs mod I assist once to a location but needs close supervision - CGA for management of balance. Able to ambulate without an assistive device and transfers with min assist. Goals for discharge set for mod I overall.  *See Care Plan and progress notes for long and short-term goals.   Revisions to Treatment Plan:  Sleep Smart Study participant   Teaching Needs: Safety, medications, CPAP management, transfers, toileting, etc.   Current Barriers to Discharge: Decreased caregiver support and Home enviroment access/layout  Possible Resolutions to Barriers: Family education OP follow up services Has a TTB     Medical Summary Current Status: nausea/vomiting  related to CVA plus constipation  Barriers to Discharge: Uncontrolled Hypertension   Possible Resolutions to [employer] Focus: med management of HTN, constipation and nausea   Continued Need for Acute Rehabilitation Level of Care: The patient requires daily medical management by a physician with specialized training  in physical medicine and rehabilitation for the following reasons: Direction of a multidisciplinary physical rehabilitation program to maximize functional independence : Yes Medical management of patient stability for increased activity during participation in an intensive rehabilitation regime.: Yes Analysis of laboratory values and/or radiology reports with any subsequent need for medication adjustment and/or medical intervention. : Yes   I attest that I was present,  lead the team conference, and concur with the assessment and plan of the team.   Chana Bode B 03/14/2023, 1:11 PM

## 2023-03-14 NOTE — Progress Notes (Signed)
   03/14/23 0000  BiPAP/CPAP/SIPAP  BiPAP/CPAP/SIPAP Pt Type Adult  BiPAP/CPAP/SIPAP Resmed  Mask Type Nasal pillows  Patient Home Equipment No  Safety Check Completed by RT for Home Unit Yes, no issues noted  BiPAP/CPAP /SiPAP Vitals  SpO2 99 %

## 2023-03-14 NOTE — Progress Notes (Signed)
Physical Therapy Session Note  Patient Details  Name: Bradley Davis MRN: [id number] Date of Birth: 02-Jun-1986  Today's Date: 03/14/2023 PT Individual Time: 1010-1121 PT Individual Time Calculation (min): 71 min   Short Term Goals: Week 1:  PT Short Term Goal 1 (Week 1): Pt will perform rise to stand with overall supervision and decreased apprehension and decreased R lateral bias. PT Short Term Goal 2 (Week 1): Pt will ambulate >226ft with <50% LOB to R requiring no more than CGA to maintain. PT Short Term Goal 3 (Week 1): Pt will perform stair negotiation with no UE support and overall supervision/ light CGA. PT Short Term Goal 4 (Week 1): Pt will relate decrease in R sided bias with improved midline orientation.  Skilled Therapeutic Interventions/Progress Updates: Patient R sidelying in bed on entrance to room. Patient alert and agreeable to PT session.   Patient reported no pain this morning. Pt reported 5/10 dizziness when standing from EOB (abdominal brace and B TED's donned). Pt sat back down to edge of bed (pt instructed to drink water as pt stated not having a lot this morning) and BP monitored in sitting (142/85 (97)) and standing (100/69 (78)). RN and attending PA notified. 2nd BP monitored sitting (125/99 (107)) and standing (110/73 (85)) with pt reported decease in symptoms - RN notified). Pt had 1 other moment of increased dizziness when standing from sitting in day room gym prior to VOR intervention with it resolving shortly after standing. Pt RN noted that pt has not had a BM since 9/26, so pt was provided with medication to assist. Pt at end of VOR intervention in day room reported gas and could feel that "stuff is moving around." Pt ambulated back to room with CGA to sit on the toilet with close supervision for privacy. Pt did not have BM, but reported gas and feeling like "it could come out of one end or the other," but denied increased nausea, just that it was a "feeling" that it  would happen (RN notified).  Pt informed to maintain performing VOR intervention with other therapist if possible as PTA that has been doing it will not be here after today, and that pt has been habituating to increased bpm's since first day of performing VOR.   Therapeutic Activity: Bed Mobility: Pt performed supine<>sit on EOB with supervision. No VC required.  Transfers: Pt performed sit<>stand transfers throughout session with supervision for safety.   Gait Training:  Pt ambulated from room to day room gym (100"+) using no AD with CGA for safety. Pt demos initiation with increasing stance time on L LE by increasing step length on R without cues. Pt also recalled locating vertical lines in environment for upright orientation during dynamic gait. Pt with slight LOB when turning to the R (pt self corrected and realized that he did not take slow turn to find vertical line).   Neuromuscular Re-ed: NMR facilitated during session with focus on VOR. - VOR in standing starting at  120 bpm on metronome app. Pt with 2/10 dizziness, and with no noted over corrective saccades. Pt then bumped up to 130, then 140 bpm with min cues to maintain head rotation with beat. Pt reported 4-5/10 dizziness and required rest break. Each trial lasted for 1 minute. Pt has increased tolerance to this intervention from 95 bpm on first day of therapy after vestibular evaluation, to 140 bpm.   NMR performed for improvements in motor control and coordination, balance, sequencing, judgement, and self confidence/  efficacy in performing all aspects of mobility at highest level of independence.   Therex: - Pt reported tightness in neck after a slight cramp transpired at end of session. Pt sat in chair and PTA provided trigger point release to B upper traps with noted tightness and knots. Pt then performed 2 x 20 second levator scapula stretch afterwards bilaterally with pt reporting decrease in tightness, and increase in ability to  move neck vs earlier this week when performing same stretch.   Patient supine in bed at end of session with brakes locked, bed alarm set, and all needs within reach.      Therapy Documentation Precautions:  Precautions Precautions: Fall Precaution Comments: orthostatic hypotension, significant bias to R and mild to mod posterior, orders for abd binder when standing Restrictions Weight Bearing Restrictions: No  Therapy/Group: Individual Therapy  Shalini Mair PTA 03/14/2023, 12:17 PM

## 2023-03-14 NOTE — Plan of Care (Signed)
  Problem: Consults Goal: RH STROKE PATIENT EDUCATION Description: See Patient Education module for education specifics  Outcome: Progressing   Problem: RH BOWEL ELIMINATION Goal: RH STG MANAGE BOWEL WITH ASSISTANCE Description: STG Manage Bowel with  mod I Assistance. Outcome: Progressing Goal: RH STG MANAGE BOWEL W/MEDICATION W/ASSISTANCE Description: STG Manage Bowel with Medication with mod I  Assistance. Outcome: Progressing   Problem: RH SAFETY Goal: RH STG ADHERE TO SAFETY PRECAUTIONS W/ASSISTANCE/DEVICE Description: STG Adhere to Safety Precautions With cues Assistance/Device. Outcome: Progressing   Problem: RH KNOWLEDGE DEFICIT Goal: RH STG INCREASE KNOWLEDGE OF DIABETES Description: Patient will be able to manage DM with medication and dietary modification using educational resources independently Outcome: Progressing Goal: RH STG INCREASE KNOWLEDGE OF HYPERTENSION Description: Patient will be able to manage HTN with medication and dietary modification using educational resources independently Outcome: Progressing Goal: RH STG INCREASE KNOWLEGDE OF HYPERLIPIDEMIA Description: Patient will be able to manage HLD with medication and dietary modification using educational resources independently Outcome: Progressing Goal: RH STG INCREASE KNOWLEDGE OF STROKE PROPHYLAXIS Description: Patient will be able to manage secondary stroke risks with medication and dietary modification using educational resources independently Outcome: Progressing

## 2023-03-14 NOTE — Progress Notes (Signed)
Soap suds enema given this evening after sorbitol was unsuccessful. Pt tolerated well, pt was able to intake , awaiting results. Night shift RN notified.

## 2023-03-14 NOTE — Progress Notes (Signed)
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 Denies CP, SOB, abd pain, N/V/D, or any other complaints at this time.    Objective:   DG Abd 1 View  Result Date: 03/12/2023 CLINICAL DATA:  Nausea and vomiting. EXAM: ABDOMEN - 1 VIEW COMPARISON:  Radiograph 03/01/2023 FINDINGS: Moderate stool in the transverse, descending and sigmoid colon. Stool balls in the rectum. Single prominent small bowel loop in the right abdomen measuring up to 3.8 cm. No evidence of free air. No radiopaque calculi. No acute osseous findings. IMPRESSION: 1. Moderate stool in the colon suggesting constipation. Stool balls in the rectum. 2. Single prominent small bowel loop in the right abdomen, nonspecific. This may be related to ileus. Electronically Signed   By: Narda Rutherford M.D.   On: 03/12/2023 21:39   Recent Labs    03/11/23 1741 03/12/23 1508  WBC 6.9 6.6  HGB 12.4* 13.6  HCT 36.8* 40.7  PLT 320 370   Recent Labs    03/11/23 1741 03/12/23 1508  NA 135 138  K 3.9 4.1  CL 106 104  CO2 21* 22  GLUCOSE 118* 147*  BUN 22* 22*  CREATININE 2.85* 3.06*  CALCIUM 8.6* 9.4        Intake/Output Summary (Last 24 hours) at 03/14/2023 0835 Last data filed at 03/13/2023 1200 Gross per 24 hour  Intake 236 ml  Output --  Net 236 ml        Physical Exam: Vital Signs Blood pressure 108/69, pulse 87, temperature 98.3 F (36.8 C), temperature source Oral, resp. rate 16, height 5\' 10"  (1.778 m), weight 117.8 kg, SpO2 99%.   General: No acute distress Mood and affect are appropriate Heart: Regular rate and rhythm no rubs murmurs or extra sounds Lungs: Clear to auscultation, breathing unlabored, no rales or wheezes Abdomen: Positive bowel sounds, soft nontender to palpation, nondistended Extremities: No clubbing,  cyanosis, or edema Skin: No evidence of breakdown, no evidence of rash   PRIOR EXAM: Neurologic exam:  Cognition: AAO to person, place, time and event.   Insight: Good insight into current condition.  Mood: Pleasant affect, appropriate mood.    CN: 2-12 grossly intact.  Coordination: no evidence of dysmetiria Intact fine motor finger to thumb  Spasticity: MAS 0 in all extremities.  Strength:                RUE: 5/5 SA, 5/5 EF, 5/5 EE, 5/5 WE, 5/5 FF, 5/5 FA                 LUE: 5/5 SA, 5/5 EF, 5/5 EE, 5/5 WE, 5/5 FF, 5/5 FA                 RLE: 5/5 HF, 5/5 KE, 5/5 DF, 5/5 EHL, 5/5 PF                 LLE:  5/5 HF, 5/5 KE, 5/5 DF, 5/5 EHL, 5/5 PF   Assessment/Plan: 1. Functional deficits which require 3+ hours per day of interdisciplinary therapy  in a comprehensive inpatient rehab setting. Physiatrist is providing close team supervision and 24 hour management of active medical problems listed below. Physiatrist and rehab team continue to assess barriers to discharge/monitor patient progress toward functional and medical goals  Care Tool:  Bathing    Body parts bathed by patient: Right arm, Left arm, Chest, Abdomen, Front perineal area, Buttocks, Right upper leg, Left upper leg, Right lower leg, Left lower leg, Face         Bathing assist Assist Level: Supervision/Verbal cueing     Upper Body Dressing/Undressing Upper body dressing   What is the patient wearing?: Pull over shirt    Upper body assist Assist Level: Independent    Lower Body Dressing/Undressing Lower body dressing      What is the patient wearing?: Underwear/pull up, Pants     Lower body assist Assist for lower body dressing: Supervision/Verbal cueing     Toileting Toileting    Toileting assist Assist for toileting: Supervision/Verbal cueing     Transfers Chair/bed transfer  Transfers assist     Chair/bed transfer assist level: Supervision/Verbal cueing      Locomotion Ambulation   Ambulation assist      Assist level: Minimal Assistance - Patient > 75% Assistive device: No Device Max distance: 200 ft   Walk 10 feet activity   Assist     Assist level: Supervision/Verbal cueing Assistive device: No Device   Walk 50 feet activity   Assist    Assist level: Minimal Assistance - Patient > 75% Assistive device: No Device    Walk 150 feet activity   Assist    Assist level: Minimal Assistance - Patient > 75% Assistive device: No Device    Walk 10 feet on uneven surface  activity   Assist Walk 10 feet on uneven surfaces activity did not occur: Safety/medical concerns         Wheelchair     Assist Is the patient using a wheelchair?: No   Wheelchair activity did not occur: N/A         Wheelchair 50 feet with 2 turns activity    Assist    Wheelchair 50 feet with 2 turns activity did not occur: N/A       Wheelchair 150 feet activity     Assist  Wheelchair 150 feet activity did not occur: N/A       Blood pressure 108/69, pulse 87, temperature 98.3 F (36.8 C), temperature source Oral, resp. rate 16, height 5\' 10"  (1.778 m), weight 117.8 kg, SpO2 99%.  Medical Problem List and Plan: 1. 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  Antithrombotics: -DVT/anticoagulation:  Pharmaceutical: Lovenox added 40mg  daily             -antiplatelet therapy:  DAPT X 3 weeks followed by ASA alone 3. Right sided headaches/Pain Management:  tylenol prn. Using 650mg  BID on average   4. Mood/Behavior/Sleep: LCSW to follow for evaluation and support.              -antipsychotic agents: N/A 5. Neuropsych/cognition: This patient is capable of making decisions on his own behalf. 6. Skin/Wound Care: Routine pressure relief measures.  7. Fluids/Electrolytes/Nutrition: Monitor I/O. Check CMET in am.   8. HTN: Monitor BP QID--continue hydralazine 100mg  q8h, amlodipine 10mg  daily,  and labetalol 300mg  q8h.              --was on amlodipine and hydrochlorothiazide PTA.             --supine HTN recorded and contributing to dizziness. Encourage fluid intake --Lying SBP 162/103 w/P-78--> Sitting SBP 119/94 w/P-90. Orthostatic vitals ordered. --Wears TEDs for chronic peripheral edema. Will order binder for use (question autonomic dysfunction)  -8/31-9/1 BPs a little elevated, will see what trend is before adjusting meds, may need hydrochlorothiazide added back? Vitals:   03/11/23 1600 03/11/23 2058 03/12/23 0452 03/12/23 1404  BP: (!) 156/92 (!) 158/99 (!) 154/95 103/74   03/12/23 1409 03/12/23 1412 03/12/23 1958 03/13/23 0424  BP: (!) 141/96 (!) 161/99 (!) 149/77 (!) 155/95   03/13/23 1312 03/13/23 1314 03/13/23 1926 03/14/23 0448  BP: 124/77 101/63 (!) 148/81 108/69  Still elevated  on max dose amlodipine , hydralazine and labetolol, no diuretics due to elevated BUN and Creat , will trial clonidine - improving   9. T1DM: Hgb A1C-13.8 and poorly controlled due to lack of insulin pump/insurance issues.  --was on Lantus 40 units once a day w/counting carbs-BS running 300s for a while --Now on Levmir 20 units BID with novolog 2 units ac TID  --Will monitor BS ac/hs and use SSI for elevated BS and titrate as indicated -8/31-9/1 CBGs looking much better; monitor closely CBG (last 3)  Recent Labs    03/13/23 1626 03/13/23 2038 03/14/23 0605  GLUCAP 188* 192* 157*   Increase semglee to 22U , try d/c meal coverage, keep SSI  , increase semglee to 25U 10. CKD: SCr- 1.94 (02/2022). Now BUN/SCr 48/3.31---> 30/3.07              --on Sodium bicarb TID for NAGMA-  Improving slowly     Latest Ref Rng & Units 03/12/2023    3:08 PM 03/11/2023    5:41 PM 03/09/2023    6:15 AM  BMP  Glucose 70 - 99 mg/dL 161  096  045   BUN 6 - 20 mg/dL 22  22  26    Creatinine 0.61 - 1.24 mg/dL 4.09  8.11  9.14   Sodium  135 - 145 mmol/L 138  135  138   Potassium 3.5 - 5.1 mmol/L 4.1  3.9  3.8   Chloride 98 - 111 mmol/L 104  106  108   CO2 22 - 32 mmol/L 22  21  23    Calcium 8.9 - 10.3 mg/dL 9.4  8.6  8.6     11. Hyperlipidemia: LDL-130 (was 396 a year ago). Continue atorvastatin 80mg  daily --Leqvio prior authorization started on acute.  12. Dizziness/Orthostasis: Vestibular evaluation and treat. Denies dizziness, double vision or nausea. Continue PRN meclizine and compazine -03/10/23 per PT Gaye Pollack, negative for BPPV, +orthostasis when standing; has TEDs; advised increased water intake. Monitor 13. Constipation: Being managed with Senna BID and Miralax daily -03/10/23 LBM , has been taking senna BID - sorbitol today 9/4 14. Anemia of chronic disease, mild : Hgb 12.5 @ admission. Recheck 03/09/23 showing 12.8; monitor on routine labs  15. OSA: Has been using CPAP at nights.    LOS: 6 days A FACE TO FACE EVALUATION WAS PERFORMED  Erick Colace 03/14/2023, 8:35 AM

## 2023-03-14 NOTE — Progress Notes (Signed)
Occupational Therapy Session Note  Patient Details  Name: Bradley Davis MRN: [id number] Date of Birth: [birth year]  Today's Date: 03/14/2023 OT Individual Time: 5643-3295 session 1 OT Individual Time Calculation (min): 76 min  Session 2: 1354-1451   Short Term Goals: Week 1:  OT Short Term Goal 1 (Week 1): STG = LTG 2/2 ELOS  Skilled Therapeutic Interventions/Progress Updates:  Session 1: Pt greeted asleep in supine, pt easily able to arouse but needed increased time to fully wake up. pt agreeable to OT intervention. Session focus on BADL reeducation, functional mobility, dynamic standing balance and decreasing overall caregiver burden.       Pt ate breakfast from bed level with set- up assist while therapist retrieved items for ADL session, no issues with self feeding noted. Assessed vitals prior to OOB mobility:                Supine- 1250/78( 91) HR 88 Sitting- 121/79 Standing- 88/59 ( 68) HR 86  Opted to wear teds in shower d/t low Bp. Pt utilized RW to ambulate into shower for increased safety with pt sitting on shower seat for entirety of shower for increased safety. Pt completed bathing tasks with very close supervision. Pt exited shower with CGA to sit on toilet for dressing, set- up assist for dressing, donned new teds with total A.   Pt exited bathroom with no AD and close CGA. Pt reports increased fatigue from bathing, education provided on allowing enough time for ADL routine once returning home, recommended 1 hour to complete ADLS in AM prior to leaving for work. Pt endorses fatigue and feeling "swimmy" headed from EOB, feeling like he "moved too fast."  Seated BP with teds and ABD binder- 130/87 ( 101) HR 85  Opted to complete oral care from sitting d/t fatigue with mind set of using energy conservation strategies as needed. Pt completed seated oral care MODI. Pt transported to gym with total A. Pt completed dynamic standing balance task at hi lo table where pt instructed to  step up onto 4 inch step then reaching dynamically to match cones to correlating dots with an emphasis on simulating reaching during pts job. Pt completed task with CGA, pt does reports feeling "uneasy" when stepping off the step with RLE.   Pt transported back to room with total A.   Ended session with pt supine in bed with all needs within reach and bed alarm activated.           Session 2:          Pt greeted supine in bed, pt agreeable to OT intervention. Pt reports been to void bowels. Supine>sit with CGA, donned ABD binder and shoes from EOB with set- up assist. Sit>stand with no AD with CGA, pt denies dizziness upon standing, ambulatory toilet transfer with no AD and CGA. Pt with continent small bowel void able to complete 3/3 toileting task with supervision.  Pt transported to gym with total A for time mgmt. Pt completed various "yoga centric" balance challenges to challenge dynamic balance including lunges with one hand positioned on wall for balance and modified tree pose with one leg in a kick stand beside weightbearing leg with an emphasis on syncing breathing with movement. Pt required at least MOD A at times for poses in a tandem stance. Pt needing increased assist when balance on RLE.   Pt completed various "yoga centric" movements form quadraped with pt completing cat/cows with an emphasis on NMR, pt also able  to complete unilateral reaching tasks form quadraped such as "threading the needle."   Pt additionally completed IADL task in apt with pt instructed to ambulate around apt with no AD to collect various ADL items from below knee level. Pt completed task with no AD and CGA, no LOB or dizziness noted. Pt able to stand at closet to reach [facility] to place items in closet.    Ended session with pt supine in bed with bed alarm activated and all needs within reach.    Therapy Documentation Precautions:  Precautions Precautions: Fall Precaution Comments: orthostatic hypotension,  significant bias to R and mild to mod posterior, orders for abd binder when standing Restrictions Weight Bearing Restrictions: No  Pain: Session 1: 2/10 pain reported from HA, rest breaks, repositioning and increased time provided as pain intervention. Session 2: unrated pain reported from needing to have BM, offered toileting and rest breaks as needed.     Therapy/Group: Individual Therapy  Pollyann Glen [facility] 03/14/2023, 10:16 AM

## 2023-03-15 DIAGNOSIS — I639 Cerebral infarction, unspecified: Secondary | ICD-10-CM | POA: Diagnosis not present

## 2023-03-15 LAB — GLUCOSE, CAPILLARY
Glucose-Capillary: 270 mg/dL — ABNORMAL HIGH (ref 70–99)
Glucose-Capillary: 155 mg/dL — ABNORMAL HIGH (ref 70–99)
Glucose-Capillary: 130 mg/dL — ABNORMAL HIGH (ref 70–99)
Glucose-Capillary: 117 mg/dL — ABNORMAL HIGH (ref 70–99)

## 2023-03-15 MED ORDER — CLONIDINE HCL 0.1 MG/24HR TD PTWK
0.1000 mg | MEDICATED_PATCH | TRANSDERMAL | Status: DC
  Administered 2023-03-15: 0.1 mg via TRANSDERMAL
  Filled 2023-03-15: qty 1

## 2023-03-15 NOTE — Progress Notes (Signed)
Occupational Therapy Session Note  Patient Details  Name: Bradley Davis MRN: [id number] Date of Birth: 10/19/1985  Today's Date: 03/15/2023 OT Individual Time: 1191-4782 and 1120-1220 OT Individual Time Calculation (min): 60 min and 60 min    Short Term Goals: Week 1:  OT Short Term Goal 1 (Week 1): STG = LTG 2/2 ELOS  Skilled Therapeutic Interventions/Progress Updates:    Visit 1: Pain: no pain  Pt received in bed ready for therapy.  Seated 134/78 Standing : 92/72 Applied TED hose. Rested a few minutes and then stood again, standing BP 93/66 To don shoes, recommended he do a figure 4 position with foot vs bending forward.   Due to lower blood pressure, had pt sit in wc to complete oral care at sink. Pt then able to toilet.  Pt self propelled in w/c to gym and transferred to mat.  On mat worked on seat core strength exercises with pt holding a soccer ball and doing dynamic reaching to each side with a focus on lateral trunk stability.  Torso twists with ball with gaze forward. Pt scooted back on mat in supine.  He worked on [employer] strength with isometric bridge holds and then bridges along with knee twists for spinal mobility.   Pt transferred back to wc, self propelled to room and opted to transfer to recliner.  Pt resting in chair with all needs met. Alarm set and call light in reach.    Visit 2: Pain: no c/o pain Pt received in recliner. He donned abdominal binder but once standing he said he felt queasy.   Had pt sit on EOB.to work on HEP with heavy blue theraband for UE exercises to strengthen upper back and core.  Pt did well with the 5 exercises and repeated them in standing.  He was able to hold balance while twististing torso and doing overhead lat pulls with resistive band.   Pt requested to take a shower.  He stated he was not feeling queasy at this time.  He was able to ambulate to bathroom with Supervision with RW.  Got into shower to doff clothing, shower, move to toilet to  dress all with mod IND.   He then ambulated back to recliner to eat lunch.  Pt resting in recliner with all needs met. Alarm set and call light in reach.    Therapy Documentation Precautions:  Precautions Precautions: Fall Precaution Comments: orthostatic hypotension, significant bias to R and mild to mod posterior, orders for abd binder when standing Restrictions Weight Bearing Restrictions: No      Pain:   ADL: ADL Eating: Independent Where Assessed-Eating: Chair Grooming: Independent Where Assessed-Grooming: Standing at sink Upper Body Bathing: Independent Where Assessed-Upper Body Bathing: Shower Lower Body Bathing: Modified independent Where Assessed-Lower Body Bathing: Shower Upper Body Dressing: Independent Where Assessed-Upper Body Dressing: Chair Lower Body Dressing: Modified independent Where Assessed-Lower Body Dressing: Chair Toileting: Modified independent Where Assessed-Toileting: Toilet (in stance) Toilet Transfer: Distant supervision Toilet Transfer Method: Proofreader: Chiropractor Transfer: Distant supervision Tub/Shower Transfer Method: Ship broker: Insurance underwriter: Distant supervision Film/video editor Method: Designer, industrial/product: Information systems manager with back   Therapy/Group: Individual Therapy  Najeeb Uptain 03/15/2023, 12:31 PM

## 2023-03-15 NOTE — Progress Notes (Signed)
Physical Therapy Session Note  Patient Details  Name: Bradley Davis MRN: [id number] Date of Birth: [birth year]  Today's Date: 03/15/2023 PT Individual Time: 4259-5638 PT Individual Time Calculation (min): 63 min   Short Term Goals: Week 1:  PT Short Term Goal 1 (Week 1): Pt will perform rise to stand with overall supervision and decreased apprehension and decreased R lateral bias. PT Short Term Goal 2 (Week 1): Pt will ambulate >289ft with <50% LOB to R requiring no more than CGA to maintain. PT Short Term Goal 3 (Week 1): Pt will perform stair negotiation with no UE support and overall supervision/ light CGA. PT Short Term Goal 4 (Week 1): Pt will relate decrease in R sided bias with improved midline orientation.  Skilled Therapeutic Interventions/Progress Updates:   D/c planning discussed including patients desire/need to return back to work ASAP. 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 Pt performed basic transfers throughout session with overall supervision and emphasis on pt self monitoring need for rest breaks. Pt reporting minimal dizziness throughout session (mostly a "swaying" feeling) and monitored vestibular symptoms as well with head turns during functional tasks (rated at 2-3/10).   Gait training in hallway with close supervision to CGA without AD overall for balance and tendency to increase sway/mild LOB to the R as fatigued (noted more at end of session when walking back to the room). Discussed potential use of RW or rollator for community mobility to increase overall endurance/activity tolerance and balance as fatigue increases. Pt open to suggestion but wanted to think about it and see if he had access to equipment at home first (will need to follow up with CSW if equipment needed to be ordered).   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 Recommending these daily until d/c.   * floor transfer and laying in sidelying to both sides on floor for lighting pilot light and burners under the truck (repeated x 5 reps) - required extended rest breaks due to muscular and cardiovascular fatigue. Cues for technique for fixed spot gaze during transitions to decrease vestibular symptoms. CGA initially and progressed to supervision  * taking large items off of shelf (high and low) and placing into grocery cart to simulate shopping for bulk items. Utilized big boxes/steps on shelf over head and close to floor as well. Supervision to CGA overall to for balance with overhead/bulky items  * lifting 8" box step in standing from L <> R in front of mat (simulated truck) to simulate moving grease cans and food items x 3 reps (CGA to supervision)   Education throughout session on energy conservation techniques, self monitoring of fatigue levels, ways to set up work in environment for endurance limitations, potential use of RW/rollator for energy conservation/balance, and overall d/c planning recommendations.   End of session performed oral hygiene in standing at sink with supervision and returned back to recliner with S and all needs in place.  Therapy Documentation Precautions:  Precautions Precautions: Fall Precaution Comments: orthostatic hypotension, significant bias to R and mild to mod posterior, orders for abd binder when standing Restrictions Weight Bearing Restrictions: No General:   Vital Signs:  Seated BP = 152/95 (110); HR = 87 bpm Standing BP = 110/65 (79); HR = 87 bpm (mildy symptomatic but resolved)  Pain: Denies pain.  Therapy/Group: Individual Therapy  Karolee Stamps Darrol Poke, PT, DPT, CBIS  03/15/2023, 12:00 PM

## 2023-03-15 NOTE — Progress Notes (Signed)
PROGRESS NOTE   Subjective/Complaints:  Pt used insulin pump PTA, has appt in Feb and cannot get Rx until appt per pt report   ROS: as per HPI. Denies CP, SOB, abd pain, N/V/D, or any other complaints at this time.    Objective:   No results found. Recent Labs    03/12/23 1508  WBC 6.6  HGB 13.6  HCT 40.7  PLT 370   Recent Labs    03/12/23 1508  NA 138  K 4.1  CL 104  CO2 22  GLUCOSE 147*  BUN 22*  CREATININE 3.06*  CALCIUM 9.4       No intake or output data in the 24 hours ending 03/15/23 0844       Physical Exam: Vital Signs Blood pressure (!) 146/97, pulse 94, temperature 98.9 F (37.2 C), temperature source Oral, resp. rate 16, height 5\' 10"  (1.778 m), weight 117.8 kg, SpO2 100%.   General: No acute distress Mood and affect are appropriate Heart: Regular rate and rhythm no rubs murmurs or extra sounds Lungs: Clear to auscultation, breathing unlabored, no rales or wheezes Abdomen: Positive bowel sounds, soft nontender to palpation, nondistended Extremities: No clubbing, cyanosis, or edema Skin: No evidence of breakdown, no evidence of rash   PRIOR EXAM: Neurologic exam:  Cognition: AAO to person, place, time and event.   Insight: Good insight into current condition.  Mood: Pleasant affect, appropriate mood.    CN: 2-12 grossly intact.  Coordination: no evidence of dysmetiria Intact fine motor finger to thumb  Spasticity: MAS 0 in all extremities.  Strength:                RUE: 5/5 SA, 5/5 EF, 5/5 EE, 5/5 WE, 5/5 FF, 5/5 FA                 LUE: 5/5 SA, 5/5 EF, 5/5 EE, 5/5 WE, 5/5 FF, 5/5 FA                 RLE: 5/5 HF, 5/5 KE, 5/5 DF, 5/5 EHL, 5/5 PF                 LLE:  5/5 HF, 5/5 KE, 5/5 DF, 5/5 EHL, 5/5 PF   Assessment/Plan: 1. Functional deficits which require 3+ hours per day of interdisciplinary therapy in a comprehensive inpatient rehab setting. Physiatrist is providing close  team supervision and 24 hour management of active medical problems listed below. Physiatrist and rehab team continue to assess barriers to discharge/monitor patient progress toward functional and medical goals  Care Tool:  Bathing    Body parts bathed by patient: Right arm, Left arm, Chest, Abdomen, Front perineal area, Buttocks, Right upper leg, Left upper leg, Right lower leg, Left lower leg, Face         Bathing assist Assist Level: Supervision/Verbal cueing     Upper Body Dressing/Undressing Upper body dressing   What is the patient wearing?: Pull over shirt    Upper body assist Assist Level: Independent    Lower Body Dressing/Undressing Lower body dressing      What is the patient wearing?: Underwear/pull up, Pants     Lower  body assist Assist for lower body dressing: Supervision/Verbal cueing     Toileting Toileting    Toileting assist Assist for toileting: Supervision/Verbal cueing     Transfers Chair/bed transfer  Transfers assist     Chair/bed transfer assist level: Supervision/Verbal cueing     Locomotion Ambulation   Ambulation assist      Assist level: Minimal Assistance - Patient > 75% Assistive device: No Device Max distance: 200 ft   Walk 10 feet activity   Assist     Assist level: Supervision/Verbal cueing Assistive device: No Device   Walk 50 feet activity   Assist    Assist level: Minimal Assistance - Patient > 75% Assistive device: No Device    Walk 150 feet activity   Assist    Assist level: Minimal Assistance - Patient > 75% Assistive device: No Device    Walk 10 feet on uneven surface  activity   Assist Walk 10 feet on uneven surfaces activity did not occur: Safety/medical concerns         Wheelchair     Assist Is the patient using a wheelchair?: No   Wheelchair activity did not occur: N/A         Wheelchair 50 feet with 2 turns activity    Assist    Wheelchair 50 feet with 2 turns  activity did not occur: N/A       Wheelchair 150 feet activity     Assist  Wheelchair 150 feet activity did not occur: N/A       Blood pressure (!) 146/97, pulse 94, temperature 98.9 F (37.2 C), temperature source Oral, resp. rate 16, height 5\' 10"  (1.778 m), weight 117.8 kg, SpO2 100%.  Medical Problem List and Plan: 1. 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  Antithrombotics: -DVT/anticoagulation:  Pharmaceutical: Lovenox added 40mg  daily             -antiplatelet therapy:  DAPT X 3 weeks followed by ASA alone 3. Right sided headaches/Pain Management:  tylenol prn. Using 650mg  BID on average   4. Mood/Behavior/Sleep: LCSW to follow for evaluation and support.              -antipsychotic agents: N/A 5. Neuropsych/cognition: This patient is capable of making decisions on his own behalf. 6. Skin/Wound Care: Routine pressure relief measures.  7. Fluids/Electrolytes/Nutrition: Monitor I/O. Check CMET in am.  8. HTN: Monitor BP QID--continue hydralazine 100mg  q8h, amlodipine 10mg  daily, and labetalol 300mg  q8h.              --was on amlodipine and hydrochlorothiazide PTA.             --supine HTN recorded and contributing to dizziness. Encourage fluid intake --Lying SBP 162/103 w/P-78--> Sitting SBP 119/94 w/P-90. Orthostatic vitals ordered. --Wears TEDs for chronic peripheral edema. Will order binder for use (question autonomic dysfunction)  -8/31-9/1 BPs a little elevated, will see what trend is before adjusting meds, may need hydrochlorothiazide added back? Vitals:   03/12/23 1404 03/12/23 1409 03/12/23 1412 03/12/23 1958  BP: 103/74 (!) 141/96 (!) 161/99 (!) 149/77   03/13/23 0424 03/13/23 1312 03/13/23 1314 03/13/23 1926  BP: (!) 155/95 124/77 101/63 (!) 148/81   03/14/23 0448 03/14/23 1509 03/14/23 1957 03/15/23 0621  BP: 108/69 (!) 155/91 (!) 155/93 Marland Kitchen)  146/97  Still elevated  on max dose amlodipine , hydralazine and labetolol, no diuretics due to elevated BUN and Creat , will trial clonidine - improving , but  orthostatic changes trial TTS patch   9. T1DM: Hgb A1C-13.8 and poorly controlled due to lack of insulin pump/insurance issues.  --was on Lantus 40 units once a day w/counting carbs-BS running 300s for a while --Now on Levmir 20 units BID with novolog 2 units ac TID  --Will monitor BS ac/hs and use SSI for elevated BS and titrate as indicated -8/31-9/1 CBGs looking much better; monitor closely CBG (last 3)  Recent Labs    03/14/23 1645 03/14/23 2030 03/15/23 0620  GLUCAP 183* 263* 270*   Increase semglee to 22U , resume meal coverage, keep SSI  ,cont semglee to 25U, pt saw Endo, Dr Allena Katz in [place] used insulin pump PTA needs to get Rx so he can resume  10. CKD: SCr- 1.94 (02/2022). Now BUN/SCr 48/3.31---> 30/3.07              --on Sodium bicarb TID for NAGMA-  Improving slowly     Latest Ref Rng & Units 03/12/2023    3:08 PM 03/11/2023    5:41 PM 03/09/2023    6:15 AM  BMP  Glucose 70 - 99 mg/dL 960  454  098   BUN 6 - 20 mg/dL 22  22  26    Creatinine 0.61 - 1.24 mg/dL 1.19  1.47  8.29   Sodium 135 - 145 mmol/L 138  135  138   Potassium 3.5 - 5.1 mmol/L 4.1  3.9  3.8   Chloride 98 - 111 mmol/L 104  106  108   CO2 22 - 32 mmol/L 22  21  23    Calcium 8.9 - 10.3 mg/dL 9.4  8.6  8.6     11. Hyperlipidemia: LDL-130 (was 396 a year ago). Continue atorvastatin 80mg  daily --Leqvio prior authorization started on acute.  12. Dizziness/Orthostasis: Vestibular evaluation and treat. Denies dizziness, double vision or nausea. Continue PRN meclizine and compazine -03/10/23 per PT Gaye Pollack, negative for BPPV, +orthostasis when standing; has TEDs; advised increased water intake. Monitor 13. Constipation: Being managed with Senna BID and Miralax daily -03/10/23 LBM , has been taking senna BID - sorbitol today 9/4 14. Anemia of chronic disease, mild  : Hgb 12.5 @ admission. Recheck 03/09/23 showing 12.8; monitor on routine labs  15. OSA: Has been using CPAP at nights.    LOS: 7 days A FACE TO FACE EVALUATION WAS PERFORMED  Erick Colace 03/15/2023, 8:44 AM

## 2023-03-15 NOTE — Progress Notes (Signed)
Patient ID: Bradley Davis, male   DOB: 09-Jan-1986, 37 y.o.   MRN: [id number]  Patient d/c moved to Monday 9/9 to allow patient to attend endo appt for Monday 9/9 at 3 pm.

## 2023-03-15 NOTE — Progress Notes (Signed)
Physical Therapy Session Note  Patient Details  Name: Bradley Davis MRN: [id number] Date of Birth: 21-Apr-1986  Today's Date: 03/15/2023 PT Individual Time: 4098-1191 PT Individual Time Calculation (min): 25 min   Short Term Goals: Week 1:  PT Short Term Goal 1 (Week 1): Pt will perform rise to stand with overall supervision and decreased apprehension and decreased R lateral bias. PT Short Term Goal 2 (Week 1): Pt will ambulate >250ft with <50% LOB to R requiring no more than CGA to maintain. PT Short Term Goal 3 (Week 1): Pt will perform stair negotiation with no UE support and overall supervision/ light CGA. PT Short Term Goal 4 (Week 1): Pt will relate decrease in R sided bias with improved midline orientation.  Skilled Therapeutic Interventions/Progress Updates:    Pt presents asleep in bed, easily awoken and agreeable to session. Focused on VOR exercises for gaze stabilization/habituation. Performed in standing (supervision) x 1 min trials each at 120 bpm (rated 2/10), then 130 bpm (rated 3/10 and required seated break and reports increased room spinning sensation. States he feels abdominal binder may also be too tight so loosened that). Then patients started to feel nausea and ultimately + emesis. Pt attempted to stand but did not feel well enough so wheeled in w/c over to the bed. Supervision transfer back to bed stand pivot. Returned to sidelying independently. Nursing made aware of +emesis.   Therapy Documentation Precautions:  Precautions Precautions: Fall Precaution Comments: orthostatic hypotension, significant bias to R and mild to mod posterior, orders for abd binder when standing Restrictions Weight Bearing Restrictions: No  Pain:  No reports of pain.    Therapy/Group: Individual Therapy  Karolee Stamps Darrol Poke, PT, DPT, CBIS  03/15/2023, 3:11 PM

## 2023-03-15 NOTE — Discharge Instructions (Signed)
Inpatient Rehab Discharge Instructions  Bradley Davis Discharge date and time:  03/18/23  Activities/Precautions/ Functional Status: Activity: no lifting, driving, or strenuous exercise till cleared by MD Diet: cardiac diet and diabetic diet Wound Care: none needed   Functional status:  ___ No restrictions     ___ Walk up steps independently ___ 24/7 supervision/assistance   ___ Walk up steps with assistance _X__ Intermittent supervision/assistance  ___ Bathe/dress independently ___ Walk with walker     ___ Bathe/dress with assistance ___ Walk Independently    _X__ Shower independently ___ Walk with assistance    ___ Shower with assistance _X__ No alcohol     ___ Return to work/school ________   COMMUNITY REFERRALS UPON DISCHARGE:     Outpatient: PT   OT              Agency:[facility] Phone: [phone]             Appointment Date/Time: TBD   Special Instructions:  STROKE/TIA DISCHARGE INSTRUCTIONS SMOKING Cigarette smoking nearly doubles your risk of having a stroke & is the single most alterable risk factor  If you smoke or have smoked in the last 12 months, you are advised to quit smoking for your health. Most of the excess cardiovascular risk related to smoking disappears within a year of stopping. Ask you doctor about anti-smoking medications  Quit Line: 1-800-QUIT NOW Free Smoking Cessation Classes [phone]  CHOLESTEROL Know your levels; limit fat & cholesterol in your diet  Lipid Panel     Component Value Date/Time   CHOL 193 03/05/2023 1005   TRIG 110 03/05/2023 1005   HDL 41 03/05/2023 1005   CHOLHDL 4.7 03/05/2023 1005   VLDL 22 03/05/2023 1005   LDLCALC 130 (H) 03/05/2023 1005     Many patients benefit from treatment even if their cholesterol is at goal. Goal: Total Cholesterol (CHOL) less than 160 Goal:  Triglycerides (TRIG) less than 150 Goal:  HDL greater than 40 Goal:  LDL (LDLCALC) less than 100   BLOOD  PRESSURE American Stroke Association blood pressure target is less that 120/80 mm/Hg  Your discharge blood pressure is:  BP: 130/89 Monitor your blood pressure Limit your salt and alcohol intake Many individuals will require more than one medication for high blood pressure  DIABETES (A1c is a blood sugar average for last 3 months) Goal HGBA1c is under 7% (HBGA1c is blood sugar average for last 3 months)  Diabetes:     Lab Results  Component Value Date   HGBA1C 13.8 (H) 02/28/2023    Your HGBA1c can be lowered with medications, healthy diet, and exercise. Check your blood sugar as directed by your physician Call your physician if you experience unexplained or low blood sugars.  PHYSICAL ACTIVITY/REHABILITATION Goal is 30 minutes at least 4 days per week  Activity: Increase activity slowly, and No driving, Therapies: see above Return to work: N/A at this time Activity decreases your risk of heart attack and stroke and makes your heart stronger.  It helps control your weight and blood pressure; helps you relax and can improve your mood. Participate in a regular exercise program. Talk with your doctor about the best form of exercise for you (dancing, walking, swimming, cycling).  DIET/WEIGHT Goal is to maintain a healthy weight  Your discharge diet is:  Diet Order             Diet heart healthy/carb modified Room service appropriate? Yes; Fluid consistency: Thin  Diet  effective now                   liquids Your height is:  Height: 5\' 10"  (177.8 cm) Your current weight is: Weight: 117.8 kg Your Body Mass Index (BMI) is:  BMI (Calculated): 37.26 Following the type of diet specifically designed for you will help prevent another stroke. Your goal weight is:   Your goal Body Mass Index (BMI) is 19-24. Healthy food habits can help reduce 3 risk factors for stroke:  High cholesterol, hypertension, and excess weight.  RESOURCES Stroke/Support Group:  Call [phone]   STROKE  EDUCATION PROVIDED/REVIEWED AND GIVEN TO PATIENT Stroke warning signs and symptoms How to activate emergency medical system (call 911). Medications prescribed at discharge. Need for follow-up after discharge. Personal risk factors for stroke. Pneumonia vaccine given:  Flu vaccine given:  My questions have been answered, the writing is legible, and I understand these instructions.  I will adhere to these goals & educational materials that have been provided to me after my discharge from the hospital.     My questions have been answered and I understand these instructions. I will adhere to these goals and the provided educational materials after my discharge from the hospital.  Patient/Caregiver Signature _______________________________ Date __________  Clinician Signature _______________________________________ Date __________  Please bring this form and your medication list with you to all your follow-up doctor's appointments.

## 2023-03-15 NOTE — Plan of Care (Signed)
  Problem: Consults Goal: RH STROKE PATIENT EDUCATION Description: See Patient Education module for education specifics  Outcome: Progressing   Problem: RH BOWEL ELIMINATION Goal: RH STG MANAGE BOWEL WITH ASSISTANCE Description: STG Manage Bowel with  mod I Assistance. Outcome: Progressing Goal: RH STG MANAGE BOWEL W/MEDICATION W/ASSISTANCE Description: STG Manage Bowel with Medication with mod I  Assistance. Outcome: Progressing   Problem: RH SAFETY Goal: RH STG ADHERE TO SAFETY PRECAUTIONS W/ASSISTANCE/DEVICE Description: STG Adhere to Safety Precautions With cues Assistance/Device. Outcome: Progressing   Problem: RH KNOWLEDGE DEFICIT Goal: RH STG INCREASE KNOWLEDGE OF DIABETES Description: Patient will be able to manage DM with medication and dietary modification using educational resources independently Outcome: Progressing Goal: RH STG INCREASE KNOWLEDGE OF HYPERTENSION Description: Patient will be able to manage HTN with medication and dietary modification using educational resources independently Outcome: Progressing Goal: RH STG INCREASE KNOWLEGDE OF HYPERLIPIDEMIA Description: Patient will be able to manage HLD with medication and dietary modification using educational resources independently Outcome: Progressing Goal: RH STG INCREASE KNOWLEDGE OF STROKE PROPHYLAXIS Description: Patient will be able to manage secondary stroke risks with medication and dietary modification using educational resources independently Outcome: Progressing

## 2023-03-16 DIAGNOSIS — K5901 Slow transit constipation: Secondary | ICD-10-CM | POA: Diagnosis not present

## 2023-03-16 DIAGNOSIS — I10 Essential (primary) hypertension: Secondary | ICD-10-CM | POA: Diagnosis not present

## 2023-03-16 DIAGNOSIS — I639 Cerebral infarction, unspecified: Secondary | ICD-10-CM | POA: Diagnosis not present

## 2023-03-16 DIAGNOSIS — E1022 Type 1 diabetes mellitus with diabetic chronic kidney disease: Secondary | ICD-10-CM | POA: Diagnosis not present

## 2023-03-16 LAB — GLUCOSE, CAPILLARY
Glucose-Capillary: 180 mg/dL — ABNORMAL HIGH (ref 70–99)
Glucose-Capillary: 189 mg/dL — ABNORMAL HIGH (ref 70–99)
Glucose-Capillary: 221 mg/dL — ABNORMAL HIGH (ref 70–99)
Glucose-Capillary: 235 mg/dL — ABNORMAL HIGH (ref 70–99)

## 2023-03-16 NOTE — Progress Notes (Signed)
PROGRESS NOTE   Subjective/Complaints:  Pt doing alright, slept ok, pain controlled, LBM 2 days ago which is fairly normal for him, urinating fine, denies any other complaints or concerns today.   ROS: as per HPI. Denies CP, SOB, abd pain, N/V/D, or any other complaints at this time.    Objective:   No results found. No results for input(s): "WBC", "HGB", "HCT", "PLT" in the last 72 hours.  No results for input(s): "NA", "K", "CL", "CO2", "GLUCOSE", "BUN", "CREATININE", "CALCIUM" in the last 72 hours.       Intake/Output Summary (Last 24 hours) at 03/16/2023 0826 Last data filed at 03/15/2023 1555 Gross per 24 hour  Intake 600 ml  Output --  Net 600 ml         Physical Exam: Vital Signs Blood pressure (!) 159/88, pulse 93, temperature 98.7 F (37.1 C), resp. rate 18, height 5\' 10"  (1.778 m), weight 117.8 kg, SpO2 97%.   General: No acute distress, resting in bed.  Mood and affect are appropriate Heart: Regular rate and rhythm no rubs murmurs or extra sounds Lungs: Clear to auscultation, breathing unlabored, no rales or wheezes Abdomen: Positive bowel sounds, soft nontender to palpation, nondistended Extremities: No clubbing, cyanosis, or edema Skin: No evidence of breakdown, no evidence of rash   PRIOR EXAM: Neurologic exam:  Cognition: AAO to person, place, time and event.   Insight: Good insight into current condition.  Mood: Pleasant affect, appropriate mood.    CN: 2-12 grossly intact.  Coordination: no evidence of dysmetiria Intact fine motor finger to thumb  Spasticity: MAS 0 in all extremities.  Strength:                RUE: 5/5 SA, 5/5 EF, 5/5 EE, 5/5 WE, 5/5 FF, 5/5 FA                 LUE: 5/5 SA, 5/5 EF, 5/5 EE, 5/5 WE, 5/5 FF, 5/5 FA                 RLE: 5/5 HF, 5/5 KE, 5/5 DF, 5/5 EHL, 5/5 PF                 LLE:  5/5 HF, 5/5 KE, 5/5 DF, 5/5 EHL, 5/5 PF   Assessment/Plan: 1. Functional  deficits which require 3+ hours per day of interdisciplinary therapy in a comprehensive inpatient rehab setting. Physiatrist is providing close team supervision and 24 hour management of active medical problems listed below. Physiatrist and rehab team continue to assess barriers to discharge/monitor patient progress toward functional and medical goals  Care Tool:  Bathing    Body parts bathed by patient: Right arm, Left arm, Chest, Abdomen, Front perineal area, Buttocks, Right upper leg, Left upper leg, Right lower leg, Left lower leg, Face         Bathing assist Assist Level: Supervision/Verbal cueing     Upper Body Dressing/Undressing Upper body dressing   What is the patient wearing?: Pull over shirt    Upper body assist Assist Level: Independent    Lower Body Dressing/Undressing Lower body dressing      What is the patient wearing?: Underwear/pull  up, Pants     Lower body assist Assist for lower body dressing: Supervision/Verbal cueing     Toileting Toileting    Toileting assist Assist for toileting: Supervision/Verbal cueing     Transfers Chair/bed transfer  Transfers assist     Chair/bed transfer assist level: Supervision/Verbal cueing     Locomotion Ambulation   Ambulation assist      Assist level: Contact Guard/Touching assist Assistive device: No Device Max distance: 200 ft   Walk 10 feet activity   Assist     Assist level: Supervision/Verbal cueing Assistive device: No Device   Walk 50 feet activity   Assist    Assist level: Contact Guard/Touching assist Assistive device: No Device    Walk 150 feet activity   Assist    Assist level: Contact Guard/Touching assist Assistive device: No Device    Walk 10 feet on uneven surface  activity   Assist Walk 10 feet on uneven surfaces activity did not occur: Safety/medical concerns   Assist level: Contact Guard/Touching assist     Wheelchair     Assist Is the patient  using a wheelchair?: No   Wheelchair activity did not occur: N/A         Wheelchair 50 feet with 2 turns activity    Assist    Wheelchair 50 feet with 2 turns activity did not occur: N/A       Wheelchair 150 feet activity     Assist  Wheelchair 150 feet activity did not occur: N/A       Blood pressure (!) 159/88, pulse 93, temperature 98.7 F (37.1 C), resp. rate 18, height 5\' 10"  (1.778 m), weight 117.8 kg, SpO2 97%.  Medical Problem List and Plan: 1. 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  Antithrombotics: -DVT/anticoagulation:  Pharmaceutical: Lovenox added 40mg  daily             -antiplatelet therapy:  DAPT X 3 weeks followed by ASA alone 3. Right sided headaches/Pain Management:  tylenol prn. Using 650mg  BID on average   4. Mood/Behavior/Sleep: LCSW to follow for evaluation and support.              -antipsychotic agents: N/A 5. Neuropsych/cognition: This patient is capable of making decisions on his own behalf. 6. Skin/Wound Care: Routine pressure relief measures.  7. Fluids/Electrolytes/Nutrition: Monitor I/O. Check CMET in am.  8. HTN: Monitor BP QID--continue hydralazine 100mg  q8h, amlodipine 10mg  daily, and labetalol 300mg  q8h.              --was on amlodipine and hydrochlorothiazide PTA.             --supine HTN recorded and contributing to dizziness. Encourage fluid intake --Lying SBP 162/103 w/P-78--> Sitting SBP 119/94 w/P-90. Orthostatic vitals ordered. --Wears TEDs for chronic peripheral edema. Will order binder for use (question autonomic dysfunction)  -8/31-9/1 BPs a little elevated, will see what trend is before adjusting meds, may need hydrochlorothiazide added back? -Still elevated  on max dose amlodipine , hydralazine and labetolol, no diuretics due to elevated BUN and Creat , will trial clonidine - improving , but   orthostatic changes trial TTS patch -03/16/23 BPs variable over the last couple days-- will monitor with recent changes Vitals:   03/13/23 0424 03/13/23 1312 03/13/23 1314  03/13/23 1926  BP: (!) 155/95 124/77 101/63 (!) 148/81   03/14/23 0448 03/14/23 1509 03/14/23 1957 03/15/23 0621  BP: 108/69 (!) 155/91 (!) 155/93 (!) 146/97   03/15/23 1323 03/15/23 1344 03/15/23 1924 03/16/23 0405  BP: (!) 143/95 128/64 (!) 148/87 (!) 159/88     9. T1DM: Hgb A1C-13.8 and poorly controlled due to lack of insulin pump/insurance issues.  --was on Lantus 40 units once a day w/counting carbs-BS running 300s for a while --Now on Levmir 20 units BID with novolog 2 units ac TID  --Will monitor BS ac/hs and use SSI for elevated BS and titrate as indicated  Increase semglee to 22U , resume meal coverage, keep SSI  ,cont semglee to 25U, pt saw Endo, Dr Allena Katz in [place] used insulin pump PTA needs to get Rx so he can resume  -03/16/23 CBGs looking good; monitor closely CBG (last 3)  Recent Labs    03/15/23 1647 03/15/23 2036 03/16/23 0522  GLUCAP 130* 117* 180*   10. CKD: SCr- 1.94 (02/2022). Now BUN/SCr 48/3.31---> 30/3.07              --on Sodium bicarb TID for NAGMA-  Improving slowly, ?new baseline ~3     Latest Ref Rng & Units 03/12/2023    3:08 PM 03/11/2023    5:41 PM 03/09/2023    6:15 AM  BMP  Glucose 70 - 99 mg/dL 960  454  098   BUN 6 - 20 mg/dL 22  22  26    Creatinine 0.61 - 1.24 mg/dL 1.19  1.47  8.29   Sodium 135 - 145 mmol/L 138  135  138   Potassium 3.5 - 5.1 mmol/L 4.1  3.9  3.8   Chloride 98 - 111 mmol/L 104  106  108   CO2 22 - 32 mmol/L 22  21  23    Calcium 8.9 - 10.3 mg/dL 9.4  8.6  8.6     11. Hyperlipidemia: LDL-130 (was 396 a year ago). Continue atorvastatin 80mg  daily --Leqvio prior authorization started on acute.  12. Dizziness/Orthostasis: Vestibular evaluation and treat. Denies dizziness, double vision or nausea. Continue PRN meclizine and compazine -03/10/23 per PT Gaye Pollack,  negative for BPPV, +orthostasis when standing; has TEDs; advised increased water intake. Monitor 13. Constipation: Being managed with Senna BID and Miralax daily -03/10/23 LBM , has been taking senna BID - sorbitol today 9/4 -03/16/23 LBM 2 days ago but this is normal for him; cont regimen for now 14. Anemia of chronic disease, mild : Hgb 12.5 @ admission. Recheck 03/09/23 showing 12.8; monitor on routine labs  15. OSA: Has been using CPAP at nights.    LOS: 8 days A FACE TO FACE EVALUATION WAS PERFORMED  [address] 03/16/2023, 8:26 AM

## 2023-03-16 NOTE — Progress Notes (Signed)
Occupational Therapy Discharge Summary  Patient Details  Name: Bradley Davis MRN: [id number] Date of Birth: 1986/02/18  Date of Discharge from OT service:March 17, 2023   Patient has met 8 of 8 long term goals due to improved activity tolerance, improved balance, postural control, ability to compensate for deficits, and improved coordination.  Patient to discharge at overall Modified Independent level.  Patient's care partner is independent to provide the necessary physical assistance at discharge for supervision with shower stall transfers, A as needed with high level home management.    Pt education completed.  HEP provided with theraband.   Reasons goals not met: n/a  Recommendation:  Patient will benefit from ongoing skilled OT services in outpatient setting to continue to advance functional skills in the area of iADL and Vocation.  Equipment: No equipment provided  Reasons for discharge: treatment goals met  Patient/family agrees with progress made and goals achieved: Yes  OT Discharge Precautions/Restrictions  Precautions Precautions: Fall Precaution Comments: occasional orthostatic hypotension Restrictions Weight Bearing Restrictions: No  ADL ADL Eating: Independent Where Assessed-Eating: Chair Grooming: Independent Where Assessed-Grooming: Standing at sink Upper Body Bathing: Independent Where Assessed-Upper Body Bathing: Shower Lower Body Bathing: Modified independent Where Assessed-Lower Body Bathing: Shower Upper Body Dressing: Independent Where Assessed-Upper Body Dressing: Chair Lower Body Dressing: Modified independent Where Assessed-Lower Body Dressing: Chair Toileting: Modified independent Where Assessed-Toileting: Toilet (in stance) Toilet Transfer: Community education officer Method: Proofreader: Chiropractor Transfer: Modified independent Web designer Method: Ship broker: Licensed conveyancer: Modified independent Film/video editor Method: Designer, industrial/product: Information systems manager with back Vision Baseline Vision/History: 1 Wears glasses Patient Visual Report: No change from baseline Vision Assessment?: No apparent visual deficits (had L retinal surgery) Perception  Perception: Within Functional Limits Praxis Praxis: WFL Cognition Cognition Overall Cognitive Status: Within Functional Limits for tasks assessed Orientation Level: Person;Place;Situation Person: Oriented Place: Oriented Situation: Oriented Memory: Appears intact Focused Attention: Appears intact Sustained Attention: Appears intact Awareness: Appears intact Problem Solving: Appears intact Safety/Judgment: Appears intact Brief Interview for Mental Status (BIMS) Repetition of Three Words (First Attempt): 3 Temporal Orientation: Year: Correct Temporal Orientation: Month: Accurate within 5 days Temporal Orientation: Day: Correct Recall: "Sock": Yes, no cue required Recall: "Blue": Yes, no cue required Recall: "Bed": Yes, no cue required BIMS Summary Score: 15 Sensation Sensation Light Touch: Appears Intact Hot/Cold: Appears Intact Proprioception: Appears Intact Stereognosis: Appears Intact Coordination Gross Motor Movements are Fluid and Coordinated: No Fine Motor Movements are Fluid and Coordinated: Yes Motor  Motor Motor: Within Functional Limits Mobility    Independent for ambulation and transfers in home environment without RW Trunk/Postural Assessment  Lumbar Assessment Lumbar Assessment: Within Functional Limits Postural Control Righting Reactions: improved from admission , pt able to self correct  Balance Static Sitting Balance Static Sitting - Level of Assistance: 7: Independent Dynamic Sitting Balance Dynamic Sitting - Level of Assistance: 7: Independent Static Standing Balance Static Standing - Level of Assistance: 7:  Independent Dynamic Standing Balance Dynamic Standing - Level of Assistance: 6: Modified independent (Device/Increase time) Extremity/Trunk Assessment RUE Assessment RUE Assessment: Within Functional Limits LUE Assessment LUE Assessment: Within Functional Limits   Bradley Davis 03/16/2023, 1:48 PM

## 2023-03-16 NOTE — Progress Notes (Signed)
Occupational Therapy Session Note  Patient Details  Name: Bradley Davis MRN: [id number] Date of Birth: 07-Feb-1986  Today's Date: 03/16/2023 OT Individual Time: 1305-1405 OT Individual Time Calculation (min): 60 min    Short Term Goals: Week 1:  OT Short Term Goal 1 (Week 1): STG = LTG 2/2 ELOS      Skilled Therapeutic Interventions/Progress Updates:    Pt received in bed and agreeable to therapy.  He opted to do exercises first and then shower 2nd half of session.  Pt had TED hose on and no binder.  Standing BP 120/73.  Because he was ambulating a far distance to gym without AD, pt donned binder.  In gym, placed mat on floor: -floor transfer mod I using adjacent mat table for light UE support -half kneeling torso twists with 6 lb weight to challenge balance 15 x each side -B knees on mat with overhead reach with 6 lb weight and then reaching over to opposite side 15 x each side -B knees on mat with torso twists with wt 20x -quadriped to push up on knees position by walking hands forward and back on mat to challenge core stability -quadriped to straight legs (push up position) Stretches with cat/cow in quadriped and childs pose. He ambulated back to room.  Pt completed all self care independently except for distant S with shower transfer. Pt is now safe to be mod I in the room. He understands to call for assist if he wants to shower or if he is feeling dizzy.  Pt in room with all needs met.    Therapy Documentation Precautions:  Precautions Precautions: Fall Precaution Comments: occasional orthostatic hypotension Restrictions Weight Bearing Restrictions: No    Vital Signs: Therapy Vitals Temp: 98.6 F (37 C) Temp Source: Oral Pulse Rate: 86 Resp: 19 BP: (!) 135/91 Patient Position (if appropriate): Sitting Oxygen Therapy SpO2: 98 % O2 Device: Room Air Pain: Pain Assessment Pain Score: 0-No pain ADL: ADL Eating: Independent Where Assessed-Eating: Chair Grooming:  Independent Where Assessed-Grooming: Standing at sink Upper Body Bathing: Independent Where Assessed-Upper Body Bathing: Shower Lower Body Bathing: Modified independent Where Assessed-Lower Body Bathing: Shower Upper Body Dressing: Independent Where Assessed-Upper Body Dressing: Chair Lower Body Dressing: Modified independent Where Assessed-Lower Body Dressing: Chair Toileting: Modified independent Where Assessed-Toileting: Toilet (in stance) Statistician: Community education officer Method: Proofreader: Chiropractor Transfer: Modified independent Web designer Method: Ship broker: Biochemist, clinical Transfer: Modified independent Film/video editor Method: Designer, industrial/product: Shower seat with back      Therapy/Group: Individual Therapy  Ellayna Hilligoss 03/16/2023, 1:57 PM

## 2023-03-16 NOTE — Progress Notes (Shared)
Physical Therapy Discharge Summary  Patient Details  Name: Bradley Davis MRN: [id number] Date of Birth: 07-Aug-1985  Date of Discharge from PT service:March 17, 2023  {CHL IP REHAB PT TIME CALCULATION:[id number]}   Patient has met {NUMBERS 0-12:18577} of {NUMBERS 0-12:18577} long term goals due to improved activity tolerance, improved balance, improved postural control, ability to compensate for deficits, and improved coordination.  Patient to discharge at an ambulatory level Modified Independent.   Patient's care partner is independent to provide the necessary physical assistance at discharge.  Reasons goals not met: ***  Recommendation:  Patient will benefit from ongoing skilled PT services in neuro outpatient setting to continue to advance safe functional mobility, address ongoing impairments in strength, coordination, balance, activity tolerance, cognition, safety awareness, and to minimize fall risk.  Equipment: No equipment provided  Reasons for discharge: treatment goals met and discharge from hospital  Patient/family agrees with progress made and goals achieved: Yes  PT Discharge Precautions/Restrictions Precautions Precautions: Fall Precaution Comments: occasional orthostatic hypotension, dyscoordination of RLE d/t central balance impairment Restrictions Weight Bearing Restrictions: No Vital Signs   Pain Pain Assessment Pain Scale: 0-10 Pain Score: 0-No pain Pain Interference   Vision/Perception  Vision - History Ability to See in Adequate Light: 0 Adequate Vision - Assessment Eye Alignment: Within Functional Limits Alignment/Gaze Preference: Within Defined Limits Tracking/Visual Pursuits: Able to track stimulus in all quads without difficulty Saccades: Within functional limits Convergence: Impaired - to be further tested in functional context Additional Comments: glasses are for "clearer vision at any distance", has had L retinal  surgery Perception Perception: Within Functional Limits Praxis Praxis: WFL  Cognition Overall Cognitive Status: Within Functional Limits for tasks assessed Arousal/Alertness: Awake/alert Orientation Level: Oriented X4 Focused Attention: Appears intact Sustained Attention: Appears intact Memory: Appears intact Awareness: Appears intact Problem Solving: Appears intact Safety/Judgment: Appears intact Sensation Sensation Light Touch: Appears Intact Coordination Gross Motor Movements are Fluid and Coordinated: No Fine Motor Movements are Fluid and Coordinated: Yes Heel Shin Test: [facility] Motor  Motor Motor: Within Functional Limits Motor - Discharge Observations: continued but improved bias to R side with some add'l posterior bias  Mobility Bed Mobility Bed Mobility: Rolling Right;Rolling Left;Supine to Sit;Sit to Supine Rolling Right: Independent Rolling Left: Independent Supine to Sit: Independent Sit to Supine: Independent Transfers Transfers: Sit to Stand;Stand to Sit;Stand Pivot Transfers Sit to Stand: Independent Stand to Sit: Independent Stand Pivot Transfers: Independent Transfer (Assistive device): None Locomotion  Gait Ambulation: Yes Gait Assistance: Supervision/Verbal cueing Gait Distance (Feet): 300 Feet Assistive device: None Gait Assistance Details: Verbal cues for technique;Verbal cues for precautions/safety Gait Gait: Yes Gait Pattern: Decreased stance time - right;Decreased hip/knee flexion - right;Wide base of support;Right foot flat High Level Ambulation High Level Ambulation: Backwards walking;Sudden stops;Head turns Backwards Walking: slowed but very mild deviation from path Sudden Stops: good reaction - no LOB Head Turns: pt notes difficulty with balance but no LOB noted. Stairs / Additional Locomotion Stairs: Yes Stairs Assistance: Supervision/Verbal cueing Stair Management Technique: Two rails;Alternating pattern Number of Stairs: 16 Height  of Stairs: 6 Ramp: Independent Curb: Supervision/Verbal cueing Wheelchair Mobility Wheelchair Mobility: No  Trunk/Postural Assessment  Cervical Assessment Cervical Assessment: Exceptions to [facility] (forward head) Thoracic Assessment Thoracic Assessment: Exceptions to [facility] (rounded shoulders) Lumbar Assessment Lumbar Assessment: Within Functional Limits Postural Control Postural Control: Deficits on evaluation Righting Reactions: improved from admission , pt able to self correct most instances Protective Responses: cognitive prep for RLE stance  Balance Balance Balance Assessed: Yes Standardized Balance  Assessment Standardized Balance Assessment: Functional Gait Assessment Static Sitting Balance Static Sitting - Balance Support: Feet supported;No upper extremity supported Static Sitting - Level of Assistance: 7: Independent Dynamic Sitting Balance Dynamic Sitting - Balance Support: Feet supported;During functional activity Dynamic Sitting - Level of Assistance: 7: Independent Static Standing Balance Static Standing - Balance Support: No upper extremity supported;During functional activity Static Standing - Level of Assistance: 7: Independent Dynamic Standing Balance Dynamic Standing - Balance Support: During functional activity;No upper extremity supported Dynamic Standing - Level of Assistance: 5: Stand by assistance Functional Gait  Assessment Gait assessed : Yes Gait Level Surface: Walks 20 ft in less than 7 sec but greater than 5.5 sec, uses assistive device, slower speed, mild gait deviations, or deviates 6-10 in outside of the 12 in walkway width. Change in Gait Speed: Able to change speed, demonstrates mild gait deviations, deviates 6-10 in outside of the 12 in walkway width, or no gait deviations, unable to achieve a major change in velocity, or uses a change in velocity, or uses an assistive device. Gait with Horizontal Head Turns: Performs head turns smoothly with slight  change in gait velocity (eg, minor disruption to smooth gait path), deviates 6-10 in outside 12 in walkway width, or uses an assistive device. Gait with Vertical Head Turns: Performs task with slight change in gait velocity (eg, minor disruption to smooth gait path), deviates 6 - 10 in outside 12 in walkway width or uses assistive device Gait and Pivot Turn: Turns slowly, requires verbal cueing, or requires several small steps to catch balance following turn and stop Step Over Obstacle: Is able to step over one shoe box (4.5 in total height) without changing gait speed. No evidence of imbalance. Gait with Narrow Base of Support: Ambulates 4-7 steps. Gait with Eyes Closed: Walks 20 ft, slow speed, abnormal gait pattern, evidence for imbalance, deviates 10-15 in outside 12 in walkway width. Requires more than 9 sec to ambulate 20 ft. Ambulating Backwards: Walks 20 ft, uses assistive device, slower speed, mild gait deviations, deviates 6-10 in outside 12 in walkway width. Steps: Alternating feet, no rail. Total Score: 18 FGA comment:: significant improvement; remains high fall risk Extremity Assessment      RLE Assessment RLE Assessment: Within Functional Limits LLE Assessment LLE Assessment: Within Functional Limits   Loel Dubonnet 03/16/2023, 10:30 AM

## 2023-03-16 NOTE — Progress Notes (Signed)
   03/16/23 1923  BiPAP/CPAP/SIPAP  BiPAP/CPAP/SIPAP Pt Type Adult  Mask Type Nasal pillows  Patient Home Equipment Yes   Pt has home CPAP at bedside and states he does  not ned further assistance from RT. Pt provided sterile water for humidification.

## 2023-03-16 NOTE — Progress Notes (Signed)
Physical Therapy Session Note  Patient Details  Name: Bradley Davis MRN: [id number] Date of Birth: 1986-06-19  Today's Date: 03/16/2023 PT Individual Time: 1105-1207 PT Individual Time Calculation (min): 62 min   Short Term Goals: Week 1:  PT Short Term Goal 1 (Week 1): Pt will perform rise to stand with overall supervision and decreased apprehension and decreased R lateral bias. PT Short Term Goal 2 (Week 1): Pt will ambulate >226ft with <50% LOB to R requiring no more than CGA to maintain. PT Short Term Goal 3 (Week 1): Pt will perform stair negotiation with no UE support and overall supervision/ light CGA. PT Short Term Goal 4 (Week 1): Pt will relate decrease in R sided bias with improved midline orientation.   Skilled Therapeutic Interventions/Progress Updates:  Patient supine in bed on entrance to room. Patient alert and agreeable to PT session. Relates continued "slushy" feeling mentally eve while in bed. Educated that although he did not exhibit peripheral/ vestibular impairments during testing, he has been spending more time in bed than usual and may have started to develop BPPV. Will need to spend more time upright to help resolve. Once pt is standing EOB, relates small improvement in symptoms. But continues to feel "slushy".   Patient with no pain complaint at start of session.  Therapeutic Activity: Bed Mobility: Pt performed supine <> sit with IND. Reminded to spend time with positional changes to check for resolution of symptoms. Transfers: Pt performed sit<>stand and stand pivot transfers throughout session with Mod I for time. Provided reminder for self check-in prior to mobilizing.  Gait Training:  Pt performed ambulatory transfer to bathroom with no AD and supervision. Improved weight shift to R noted. Toilets with Mod I for hold to shelf above toilet.   Ambulates to therapy gyms and performs aspects of FGA on way. Improvement noted in horizontal head turns during amb,  but pt states feeling off-balance despite no deviation in path. Once in gym, guided pt in forward amb with EC and large deviation in path. Backward amb with improvement from initial testing. Overall improvement in score to 18/ 30.  Neuromuscular Re-ed: NMR facilitated during session with focus on standing balance. Pt guided in retest of Berg Balance. Patient demonstrates increased fall risk as noted by score of  45 /56 on Berg Balance Scale.  (<36= high risk for falls, close to 100%; 37-45 significant >80%; 46-51 moderate >50%; 52-55 lower >25%)  NMR performed for improvements in motor control and coordination, balance, sequencing, judgement, and self confidence/ efficacy in performing all aspects of mobility at highest level of independence.   Patient seated EOB at end of session with brakes locked, bed alarm set, and all needs within reach.    Therapy Documentation Precautions:  Precautions Precautions: Fall Precaution Comments: orthostatic hypotension, significant bias to R and mild to mod posterior, orders for abd binder when standing Restrictions Weight Bearing Restrictions: No  Pain: Pain Assessment Pain Scale: 0-10 Pain Score: 0-No pain related this session.   Therapy/Group: Individual Therapy  Loel Dubonnet PT, DPT, CSRS 03/16/2023, 10:30 AM

## 2023-03-17 DIAGNOSIS — I10 Essential (primary) hypertension: Secondary | ICD-10-CM | POA: Diagnosis not present

## 2023-03-17 DIAGNOSIS — E1022 Type 1 diabetes mellitus with diabetic chronic kidney disease: Secondary | ICD-10-CM | POA: Diagnosis not present

## 2023-03-17 DIAGNOSIS — I639 Cerebral infarction, unspecified: Secondary | ICD-10-CM | POA: Diagnosis not present

## 2023-03-17 DIAGNOSIS — K5901 Slow transit constipation: Secondary | ICD-10-CM | POA: Diagnosis not present

## 2023-03-17 LAB — GLUCOSE, CAPILLARY
Glucose-Capillary: 309 mg/dL — ABNORMAL HIGH (ref 70–99)
Glucose-Capillary: 222 mg/dL — ABNORMAL HIGH (ref 70–99)
Glucose-Capillary: 327 mg/dL — ABNORMAL HIGH (ref 70–99)
Glucose-Capillary: 163 mg/dL — ABNORMAL HIGH (ref 70–99)

## 2023-03-17 NOTE — Progress Notes (Signed)
Occupational Therapy Session Note  Patient Details  Name: Bradley Davis MRN: [id number] Date of Birth: 1985/12/14  Today's Date: 03/17/2023 OT Individual Time: 6440-3474 OT Individual Time Calculation (min): 72 min     Skilled Therapeutic Interventions/Progress Updates: Patient received sitting up in recliner with no c/o pain. Patient reports some stress regarding what a return home to everyday life will look like. Discussed patient's current functional status not requiring AE for self care. Patient educated on current level for all OT POC goals and self care level. Good response to assessment and education on d/c safety. Patient expressed continued concerns with getting home that he feels will remain unresolved until he can get back into his every day routine. Discussed strategies to deal with stress and maintain positive outlook on his level of function and organize and prioritize responsibilities. 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 Patient taken through endurance and obstacle task navigating through the hospital and around out door surfaces. Patient without balance problems. Occasional standing rests taken. Good participation with OT POC. Patient met LTG's and recommend outpatient therapy as ordered for return to work skills/work hardening.      Therapy Documentation Precautions:  Precautions Precautions: Fall Precaution Comments: occasional orthostatic hypotension, dyscoordination of RLE d/t central balance impairment Restrictions Weight Bearing Restrictions: No General:   Vital Signs: Therapy Vitals Temp: (!) 97.3 F (36.3 C) Pulse Rate: 88 Resp: 16 BP: (!) 134/97 Patient Position (if appropriate): Sitting Oxygen Therapy SpO2: 98 % O2 Device: Room Air Pain: Pain Assessment Pain Scale: 0-10 Pain Score: 0-No pain ADL: ADL Eating:  Independent Where Assessed-Eating: Chair Grooming: Independent Where Assessed-Grooming: Standing at sink Upper Body Bathing: Independent Where Assessed-Upper Body Bathing: Shower Lower Body Bathing: Modified independent Where Assessed-Lower Body Bathing: Shower Upper Body Dressing: Independent Where Assessed-Upper Body Dressing: Chair Lower Body Dressing: Modified independent Where Assessed-Lower Body Dressing: Chair Toileting: Modified independent Where Assessed-Toileting: Toilet (in stance) Statistician: Community education officer Method: Proofreader: Chiropractor Transfer: Modified independent Web designer Method: Ship broker: Biochemist, clinical Transfer: Modified independent Film/video editor Method: Designer, industrial/product: Shower seat with back     Therapy/Group: Individual Therapy  Warnell Forester 03/17/2023, 2:14 PM

## 2023-03-17 NOTE — Progress Notes (Signed)
   03/17/23 2205  BiPAP/CPAP/SIPAP  BiPAP/CPAP/SIPAP Pt Type Adult  Mask Type Nasal pillows  Patient Home Equipment Yes  Safety Check Completed by RT for Home Unit Yes, no issues noted

## 2023-03-17 NOTE — Progress Notes (Signed)
PROGRESS NOTE   Subjective/Complaints:  Pt doing fine, slept well, pain controlled, LBM 2 days ago (might be 3 days but charted that he reported one on 9/6), which is still fairly normal for him, urinating fine. Felt a little "Swimmy headed" yesterday but denies any other complaints or concerns today.   ROS: as per HPI. Denies CP, SOB, abd pain, N/V/D, or any other complaints at this time.    Objective:   No results found. No results for input(s): "WBC", "HGB", "HCT", "PLT" in the last 72 hours.  No results for input(s): "NA", "K", "CL", "CO2", "GLUCOSE", "BUN", "CREATININE", "CALCIUM" in the last 72 hours.       Intake/Output Summary (Last 24 hours) at 03/17/2023 1031 Last data filed at 03/16/2023 1458 Gross per 24 hour  Intake 360 ml  Output --  Net 360 ml         Physical Exam: Vital Signs Blood pressure 123/74, pulse 92, temperature 98.5 F (36.9 C), temperature source Oral, resp. rate 16, height 5\' 10"  (1.778 m), weight 117.8 kg, SpO2 100%.   General: No acute distress, resting in bed. Easily arousable. CPAP on.  Mood and affect are appropriate Heart: Regular rate and rhythm no rubs murmurs or extra sounds Lungs: Clear to auscultation, breathing unlabored, no rales or wheezes Abdomen: Positive bowel sounds, soft nontender to palpation, nondistended Extremities: No clubbing, cyanosis, or edema Skin: No evidence of breakdown, no evidence of rash   PRIOR EXAM: Neurologic exam:  Cognition: AAO to person, place, time and event.   Insight: Good insight into current condition.  Mood: Pleasant affect, appropriate mood.    CN: 2-12 grossly intact.  Coordination: no evidence of dysmetiria Intact fine motor finger to thumb  Spasticity: MAS 0 in all extremities.  Strength:                RUE: 5/5 SA, 5/5 EF, 5/5 EE, 5/5 WE, 5/5 FF, 5/5 FA                 LUE: 5/5 SA, 5/5 EF, 5/5 EE, 5/5 WE, 5/5 FF, 5/5 FA                  RLE: 5/5 HF, 5/5 KE, 5/5 DF, 5/5 EHL, 5/5 PF                 LLE:  5/5 HF, 5/5 KE, 5/5 DF, 5/5 EHL, 5/5 PF   Assessment/Plan: 1. Functional deficits which require 3+ hours per day of interdisciplinary therapy in a comprehensive inpatient rehab setting. Physiatrist is providing close team supervision and 24 hour management of active medical problems listed below. Physiatrist and rehab team continue to assess barriers to discharge/monitor patient progress toward functional and medical goals  Care Tool:  Bathing    Body parts bathed by patient: Right arm, Left arm, Chest, Abdomen, Front perineal area, Buttocks, Right upper leg, Left upper leg, Right lower leg, Left lower leg, Face         Bathing assist Assist Level: Independent with assistive device     Upper Body Dressing/Undressing Upper body dressing   What is the patient wearing?: Pull over shirt  Upper body assist Assist Level: Independent    Lower Body Dressing/Undressing Lower body dressing      What is the patient wearing?: Underwear/pull up, Pants     Lower body assist Assist for lower body dressing: Independent     Toileting Toileting    Toileting assist Assist for toileting: Independent     Transfers Chair/bed transfer  Transfers assist     Chair/bed transfer assist level: Independent with assistive device     Locomotion Ambulation   Ambulation assist      Assist level: Contact Guard/Touching assist Assistive device: No Device Max distance: 200 ft   Walk 10 feet activity   Assist     Assist level: Supervision/Verbal cueing Assistive device: No Device   Walk 50 feet activity   Assist    Assist level: Contact Guard/Touching assist Assistive device: No Device    Walk 150 feet activity   Assist    Assist level: Contact Guard/Touching assist Assistive device: No Device    Walk 10 feet on uneven surface  activity   Assist Walk 10 feet on uneven surfaces  activity did not occur: Safety/medical concerns   Assist level: Contact Guard/Touching assist     Wheelchair     Assist Is the patient using a wheelchair?: No   Wheelchair activity did not occur: N/A         Wheelchair 50 feet with 2 turns activity    Assist    Wheelchair 50 feet with 2 turns activity did not occur: N/A       Wheelchair 150 feet activity     Assist  Wheelchair 150 feet activity did not occur: N/A       Blood pressure 123/74, pulse 92, temperature 98.5 F (36.9 C), temperature source Oral, resp. rate 16, height 5\' 10"  (1.778 m), weight 117.8 kg, SpO2 100%.  Medical Problem List and Plan: 1. 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  Antithrombotics: -DVT/anticoagulation:  Pharmaceutical: Lovenox added 40mg  daily             -antiplatelet therapy:  DAPT X 3 weeks followed by ASA alone 3. Right sided headaches/Pain Management:  tylenol prn. Using 650mg  BID on average   4. Mood/Behavior/Sleep: LCSW to follow for evaluation and support.              -antipsychotic agents: N/A 5. Neuropsych/cognition: This patient is capable of making decisions on his own behalf. 6. Skin/Wound Care: Routine pressure relief measures.  7. Fluids/Electrolytes/Nutrition: Monitor I/O. Check CMET in am.  8. HTN: Monitor BP QID--continue hydralazine 100mg  q8h, amlodipine 10mg  daily, and labetalol 300mg  q8h.              --was on amlodipine and hydrochlorothiazide PTA.             --supine HTN recorded and contributing to dizziness. Encourage fluid intake --Lying SBP 162/103 w/P-78--> Sitting SBP 119/94 w/P-90. Orthostatic vitals ordered. --Wears TEDs for chronic peripheral edema. Will order binder for use (question autonomic dysfunction)  -8/31-9/1 BPs a little elevated, will see what trend is before adjusting meds, may need hydrochlorothiazide  added back? -Still elevated  on max dose amlodipine , hydralazine and labetolol, no diuretics due to elevated BUN and Creat , will trial clonidine - improving , but  orthostatic changes trial TTS patch -03/16/23 BPs variable over the last couple days-- will monitor with recent changes -03/17/23 BPs improving, a little lightheaded yesterday likely from new clonidine patch; no complaints today; monitor Vitals:   03/14/23 0448 03/14/23 1509 03/14/23 1957 03/15/23 0621  BP: 108/69 (!) 155/91 (!) 155/93 (!) 146/97   03/15/23 1323 03/15/23 1344 03/15/23 1924 03/16/23 0405  BP: (!) 143/95 128/64 (!) 148/87 (!) 159/88   03/16/23 1247 03/16/23 1913 03/17/23 0554 03/17/23 0644  BP: (!) 135/91 (!) 148/85 (!) 166/101 123/74     9. T1DM: Hgb A1C-13.8 and poorly controlled due to lack of insulin pump/insurance issues.  --was on Lantus 40 units once a day w/counting carbs-BS running 300s for a while --Now on Levmir 20 units BID with novolog 2 units ac TID  --Will monitor BS ac/hs and use SSI for elevated BS and titrate as indicated  Increase semglee to 22U , resume meal coverage, keep SSI  ,cont semglee to 25U, pt saw Endo, Dr Allena Katz in [place] used insulin pump PTA needs to get Rx so he can resume  -03/16/23 CBGs looking good; monitor closely -03/17/23 CBGs elevated today, monitor trend but may need further uptitration by weekday team CBG (last 3)  Recent Labs    03/16/23 1627 03/16/23 2037 03/17/23 0556  GLUCAP 221* 235* 309*   10. CKD: SCr- 1.94 (02/2022). Now BUN/SCr 48/3.31---> 30/3.07              --on Sodium bicarb TID for NAGMA-  Improving slowly, ?new baseline ~3     Latest Ref Rng & Units 03/12/2023    3:08 PM 03/11/2023    5:41 PM 03/09/2023    6:15 AM  BMP  Glucose 70 - 99 mg/dL 960  454  098   BUN 6 - 20 mg/dL 22  22  26    Creatinine 0.61 - 1.24 mg/dL 1.19  1.47  8.29   Sodium 135 - 145 mmol/L 138  135  138   Potassium 3.5 - 5.1 mmol/L 4.1  3.9  3.8   Chloride 98 - 111 mmol/L 104  106  108    CO2 22 - 32 mmol/L 22  21  23    Calcium 8.9 - 10.3 mg/dL 9.4  8.6  8.6     11. Hyperlipidemia: LDL-130 (was 396 a year ago). Continue atorvastatin 80mg  daily --Leqvio prior authorization started on acute.  12. Dizziness/Orthostasis: Vestibular evaluation and treat. Denies dizziness, double vision or nausea. Continue PRN meclizine and compazine -03/10/23 per PT Gaye Pollack, negative for BPPV, +orthostasis when standing; has TEDs; advised increased water intake. Monitor 13. Constipation: Being managed with Senna BID and Miralax daily -03/10/23 LBM , has been taking senna BID - sorbitol today 9/4 -03/17/23 LBM 2 (maybe 3?) days ago but this is normal for him; cont regimen for now 14. Anemia of chronic disease, mild : Hgb 12.5 @ admission. Recheck 03/09/23 showing 12.8; monitor on routine labs  15. OSA: Has been using CPAP at nights.    LOS: 9 days A FACE TO FACE EVALUATION WAS PERFORMED  [address] 03/17/2023, 10:31 AM

## 2023-03-18 DIAGNOSIS — I639 Cerebral infarction, unspecified: Secondary | ICD-10-CM | POA: Diagnosis not present

## 2023-03-18 DIAGNOSIS — K5900 Constipation, unspecified: Secondary | ICD-10-CM | POA: Insufficient documentation

## 2023-03-18 LAB — CBC
HCT: 40.1 % (ref 39.0–52.0)
Hemoglobin: 13.2 g/dL (ref 13.0–17.0)
MCH: 27.8 pg (ref 26.0–34.0)
MCHC: 32.9 g/dL (ref 30.0–36.0)
MCV: 84.4 fL (ref 80.0–100.0)
Platelets: 370 10*3/uL (ref 150–400)
RBC: 4.75 MIL/uL (ref 4.22–5.81)
RDW: 12.7 % (ref 11.5–15.5)
WBC: 5.7 10*3/uL (ref 4.0–10.5)
nRBC: 0 % (ref 0.0–0.2)

## 2023-03-18 LAB — BASIC METABOLIC PANEL
Anion gap: 7 (ref 5–15)
BUN: 19 mg/dL (ref 6–20)
CO2: 22 mmol/L (ref 22–32)
Calcium: 8.7 mg/dL — ABNORMAL LOW (ref 8.9–10.3)
Chloride: 109 mmol/L (ref 98–111)
Creatinine, Ser: 2.86 mg/dL — ABNORMAL HIGH (ref 0.61–1.24)
GFR, Estimated: 28 mL/min — ABNORMAL LOW (ref >60)
Glucose, Bld: 68 mg/dL — ABNORMAL LOW (ref 70–99)
Potassium: 3.7 mmol/L (ref 3.5–5.1)
Sodium: 138 mmol/L (ref 135–145)

## 2023-03-18 LAB — GLUCOSE, CAPILLARY
Glucose-Capillary: 61 mg/dL — ABNORMAL LOW (ref 70–99)
Glucose-Capillary: 95 mg/dL (ref 70–99)

## 2023-03-18 MED ORDER — AMLODIPINE BESYLATE 10 MG PO TABS: 10.0000 mg | ORAL_TABLET | Freq: Every day | ORAL | 0 refills | Status: DC

## 2023-03-18 MED ORDER — BASAGLAR KWIKPEN 100 UNIT/ML ~~LOC~~ SOPN: 25.0000 [IU] | PEN_INJECTOR | Freq: Two times a day (BID) | SUBCUTANEOUS | Status: AC

## 2023-03-18 MED ORDER — SENNA 8.6 MG PO TABS: 1.0000 | ORAL_TABLET | Freq: Two times a day (BID) | ORAL | 0 refills | Status: AC

## 2023-03-18 MED ORDER — ASPIRIN 81 MG PO TBEC: 81.0000 mg | DELAYED_RELEASE_TABLET | Freq: Every day | ORAL | 0 refills | Status: DC

## 2023-03-18 MED ORDER — CLONIDINE 0.1 MG/24HR TD PTWK: 0.1000 mg | MEDICATED_PATCH | TRANSDERMAL | 12 refills | Status: DC

## 2023-03-18 MED ORDER — SODIUM BICARBONATE 650 MG PO TABS: 650.0000 mg | ORAL_TABLET | Freq: Three times a day (TID) | ORAL | 0 refills | Status: AC

## 2023-03-18 MED ORDER — ATORVASTATIN CALCIUM 80 MG PO TABS: 80.0000 mg | ORAL_TABLET | Freq: Every day | ORAL | 0 refills | Status: DC

## 2023-03-18 MED ORDER — POLYETHYLENE GLYCOL 3350 17 G PO PACK: 17.0000 g | PACK | Freq: Every day | ORAL | 0 refills | Status: DC

## 2023-03-18 MED ORDER — PANTOPRAZOLE SODIUM 40 MG PO TBEC: 40.0000 mg | DELAYED_RELEASE_TABLET | Freq: Every day | ORAL | 0 refills | Status: AC

## 2023-03-18 MED ORDER — HYDRALAZINE HCL 100 MG PO TABS: 100.0000 mg | ORAL_TABLET | Freq: Three times a day (TID) | ORAL | 0 refills | Status: DC

## 2023-03-18 MED ORDER — CLOPIDOGREL BISULFATE 75 MG PO TABS: 75.0000 mg | ORAL_TABLET | Freq: Every day | ORAL | 0 refills | Status: DC

## 2023-03-18 MED ORDER — LABETALOL HCL 300 MG PO TABS: 300.0000 mg | ORAL_TABLET | Freq: Three times a day (TID) | ORAL | 0 refills | Status: DC

## 2023-03-18 MED ORDER — SENNA 8.6 MG PO TABS: 1.0000 | ORAL_TABLET | Freq: Two times a day (BID) | ORAL | 0 refills | Status: DC

## 2023-03-18 NOTE — Discharge Summary (Signed)
Physician Discharge Summary  Patient ID: Bradley Davis MRN: [id number] DOB/AGE: 11-13-1985 37 y.o.  Admit date: 03/08/2023 Discharge date: 03/18/2023  Discharge Diagnoses:  Principal Problem:   Cerebellar stroke [facility]) Active Problems:   DM (diabetes mellitus), type 1 with renal complications (HCC)   Essential hypertension   Acute renal failure superimposed on stage 3a chronic kidney disease (HCC)   OSA (obstructive sleep apnea)   Constipation   Discharged Condition: stable  Significant Diagnostic Studies: DG Abd 1 View  Result Date: 03/12/2023 CLINICAL DATA:  Nausea and vomiting. EXAM: ABDOMEN - 1 VIEW COMPARISON:  Radiograph 03/01/2023 FINDINGS: Moderate stool in the transverse, descending and sigmoid colon. Stool balls in the rectum. Single prominent small bowel loop in the right abdomen measuring up to 3.8 cm. No evidence of free air. No radiopaque calculi. No acute osseous findings. IMPRESSION: 1. Moderate stool in the colon suggesting constipation. Stool balls in the rectum. 2. Single prominent small bowel loop in the right abdomen, nonspecific. This may be related to ileus. Electronically Signed   By: Narda Rutherford M.D.   On: 03/12/2023 21:39   Labs:  Basic Metabolic Panel: Recent Labs  Lab 03/11/23 1741 03/12/23 1508 03/18/23 0627  NA 135 138 138  K 3.9 4.1 3.7  CL 106 104 109  CO2 21* 22 22  GLUCOSE 118* 147* 68*  BUN 22* 22* 19  CREATININE 2.85* 3.06* 2.86*  CALCIUM 8.6* 9.4 8.7*    CBC: Recent Labs  Lab 03/11/23 1741 03/12/23 1508 03/18/23 0627  WBC 6.9 6.6 5.7  NEUTROABS  --  3.6  --   HGB 12.4* 13.6 13.2  HCT 36.8* 40.7 40.1  MCV 82.9 82.9 84.4  PLT 320 370 370    CBG: Recent Labs  Lab 03/17/23 1136 03/17/23 1632 03/17/23 2026 03/18/23 0613 03/18/23 0642  GLUCAP 222* 327* 163* 61* 95    Brief HPI:   Bradley Davis is a 37 y.o. LH-male with history of T1DM, CKD 3A, HTN who was admitted on 02/27/2023 with 2-day history of nausea vomiting  progressing to dizziness with standing at times.  He was found to have acute on chronic renal failure with serum creatinine up to 3.6 and was treated with fluid boluses.  He was also started on bowel regimen due to his evidence of constipation.  Therapy was working with patient who continued to have issues with nausea as well as decreased sensation RLE staggering gait as well as orthostatic changes.    MRI brain done for workup showing small acute infarct in lateral right medulla and deep right cerebral white matter adjacent to lateral ventricle.  2D echo showed EF 55 to 60% and was negative for shunt.  Renal ultrasound showed normal kidneys.  Dr. Pearlean Brownie felt that stroke was due to small vessel disease and recommended DAPT x 3 weeks followed by aspirin alone.  Due to ongoing issues with deficits in mobility and ADLs, CIR was recommended for follow-up therapy   Hospital Course: ROME WOLPERT was admitted to rehab 03/08/2023 for inpatient therapies to consist of PT and OT at least three hours five days a week. Past admission physiatrist, therapy team and rehab RN have worked together to provide customized collaborative inpatient rehab. Blood pressures were monitored on TID basis and he was noted to have significant orthostatic changes.  Vestibular eval was negative for BPPV.  He was found to have acute on chronic renal failure and was treated with fluid bolus with improvement.  Follow-up check of  electrolytes shows improvement with BUN normalized and serum creatinine at baseline.  He does continue on sodium bicarb with improvement in NAGMA.  Abdominal binder and teds were ordered for use when up with mobility.  BP meds have been adjusted during his stay for better control without side effects.  He has had improvement in activity tolerance and dizziness is resolved.  He was advised to use abdominal binder when fatigued or as needed dizziness. He was maintained on DAPT during his stay and is tolerating this  without SE. Follow up CBC showed H/H and platelets to be WNL.   He had recurrent issues with Nausea and Vomiting with UB showing evidence of constipation.  He was placed on liquid diet x 2 days and started on bowel program with good results.  Regular diet has been resumed and he is tolerating this without any recurrent symptoms.  His blood sugars has been monitored with ac/hs CBG checks and SSI was use prn for tighter BS control.  Semglee has been titrated upwards for better control.  He felt that his blood sugars are better managed with insulin pump and has been set to follow-up with endocrine at discharge for resumption of pump after discharge.    He has been compliant with CPAP use during his stay.  He has made steady gains during his rehab stay and currently modified independent in supervised setting.  He will continue to receive follow-up outpatient PT and OT at [facility] neuro rehab after discharge.   Rehab course: During patient's stay in rehab weekly team conferences were held to monitor patient's progress, set goals and discuss barriers to discharge. At admission, patient required min assist with basic ADL tasks and with mobility.  He  has had improvement in activity tolerance, balance, postural control as well as ability to compensate for deficits.  He is able to complete ADL tasks at modified independent level. He is modified independent for transfers and to ambulate 200 feet without assistive device and supervision.  He is independent in supervised setting.  Family education has been completed.    Discharge disposition: 01-Home or Self Care  Diet: Carb modified/ Heart healthy  Special Instructions: No alcohol or smoking 2.  Monitor blood sugars ACHS and follow-up with endocrine for further adjustment in insulin regimen. 3.  Wear abdominal binder when fatigued or as needed orthostatic symptoms. 4.  Recommend repeat check of BMET in 1-2 weeks to monitor renal status/NAGMA and input on need to  continue sodium bicarb.  5. Plavix to stop after 7 days.     Allergies as of 03/18/2023       Reactions   Ibuprofen Swelling   Pt reports taking after reported allergy w/ no reactions        Medication List     STOP taking these medications    insulin aspart 100 UNIT/ML injection Commonly known as: novoLOG   meclizine 25 MG tablet Commonly known as: ANTIVERT   metoCLOPramide 10 MG tablet Commonly known as: REGLAN   sildenafil 50 MG tablet Commonly known as: VIAGRA   sodium chloride 0.65 % nasal spray Commonly known as: OCEAN   Vitamin D (Ergocalciferol) 1.25 MG (50000 UNIT) Caps capsule Commonly known as: DRISDOL       TAKE these medications    acetaminophen 325 MG tablet Commonly known as: TYLENOL Take 650 mg by mouth as needed for mild pain or headache.   amLODipine 10 MG tablet Commonly known as: NORVASC Take 1 tablet (10 mg total) by  mouth daily.   aspirin EC 81 MG tablet Take 1 tablet (81 mg total) by mouth daily. Swallow whole.   atorvastatin 80 MG tablet Commonly known as: LIPITOR Take 1 tablet (80 mg total) by mouth daily with supper. What changed: when to take this   Basaglar KwikPen 100 UNIT/ML Inject 25 Units into the skin 2 (two) times daily. What changed:  how much to take when to take this Notes to patient: OR Transition to insulin pump per endocrinology    cloNIDine 0.1 mg/24hr patch Commonly known as: CATAPRES - Dosed in mg/24 hr Place 1 patch (0.1 mg total) onto the skin once a week. Change every Friday Start taking on: March 22, 2023   clopidogrel 75 MG tablet Commonly known as: PLAVIX Take 1 tablet (75 mg total) by mouth daily. What changed: additional instructions   hydrALAZINE 100 MG tablet Commonly known as: APRESOLINE Take 1 tablet (100 mg total) by mouth every 8 (eight) hours.   labetalol 300 MG tablet Commonly known as: NORMODYNE Take 1 tablet (300 mg total) by mouth every 8 (eight) hours.   pantoprazole 40  MG tablet Commonly known as: PROTONIX Take 1 tablet (40 mg total) by mouth daily. Start taking on: March 19, 2023   polyethylene glycol 17 g packet Commonly known as: MIRALAX / GLYCOLAX Take 17 g by mouth daily. Notes to patient: For constipation   senna 8.6 MG Tabs tablet Commonly known as: SENOKOT Take 1 tablet (8.6 mg total) by mouth 2 (two) times daily.   sodium bicarbonate 650 MG tablet Take 1 tablet (650 mg total) by mouth 3 (three) times daily.        Follow-up Information     Izell Alex, MD Follow up on 03/18/2023.   Specialty: Endocrinology Why: Be there at 3:00 for 3:20 pm Contact information: [address] [place] [address] [phone]         Erick Colace, MD Follow up.   Specialty: Physical Medicine and Rehabilitation Why: office will call you with follow up appointment Contact information: [address] [place] [address] [phone]         [facility] Follow up.   Why: Call in 1-2 days for post hospital follow up Contact information: [address]     [address] [place] [address] [phone]                Signed: Jacquelynn Cree 03/18/2023, 9:53 AM

## 2023-03-18 NOTE — Plan of Care (Deleted)
  Problem: RH Floor Transfers Goal: LTG Patient will perform floor transfers w/assist (PT) Description: LTG: Patient will perform floor transfers with assistance (PT). Outcome: Not Met (add Reason) Note: Pt continues with some n/v and some orthostatic hypotension after changes in position - goal deemed not appropriate at this time. Will need to work with OPPT on floor transfers.

## 2023-03-18 NOTE — Progress Notes (Signed)
PROGRESS NOTE   Subjective/Complaints:   ROS: as per HPI. Denies CP, SOB, abd pain, N/V/D, or any other complaints at this time.    Objective:   No results found. Recent Labs    03/18/23 0627  WBC 5.7  HGB 13.2  HCT 40.1  PLT 370    Recent Labs    03/18/23 0627  NA 138  K 3.7  CL 109  CO2 22  GLUCOSE 68*  BUN 19  CREATININE 2.86*  CALCIUM 8.7*         Intake/Output Summary (Last 24 hours) at 03/18/2023 0911 Last data filed at 03/18/2023 0841 Gross per 24 hour  Intake 830 ml  Output --  Net 830 ml         Physical Exam: Vital Signs Blood pressure (!) 160/92, pulse 88, temperature 98.5 F (36.9 C), temperature source Oral, resp. rate 18, height 5\' 10"  (1.778 m), weight 117.8 kg, SpO2 97%.   General: No acute distress, resting in bed. Easily arousable. CPAP on.  Mood and affect are appropriate Heart: Regular rate and rhythm no rubs murmurs or extra sounds Lungs: Clear to auscultation, breathing unlabored, no rales or wheezes Abdomen: Positive bowel sounds, soft nontender to palpation, nondistended Extremities: No clubbing, cyanosis, or edema Skin: No evidence of breakdown, no evidence of rash   PRIOR EXAM: Neurologic exam:  Cognition: AAO to person, place, time and event.   Insight: Good insight into current condition.  Mood: Pleasant affect, appropriate mood.    CN: 2-12 grossly intact.  Coordination: no evidence of dysmetiria Intact fine motor finger to thumb  Spasticity: MAS 0 in all extremities.  Strength:                RUE: 5/5 SA, 5/5 EF, 5/5 EE, 5/5 WE, 5/5 FF, 5/5 FA                 LUE: 5/5 SA, 5/5 EF, 5/5 EE, 5/5 WE, 5/5 FF, 5/5 FA                 RLE: 5/5 HF, 5/5 KE, 5/5 DF, 5/5 EHL, 5/5 PF                 LLE:  5/5 HF, 5/5 KE, 5/5 DF, 5/5 EHL, 5/5 PF   Assessment/Plan: 1. 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  Stable for D/C today F/u PCP in 3-4 weeks F/u PM&R  2 weeks No driving or return to work until rehab f/u  See D/C summary See D/C instructions   Care Tool:  Bathing    Body parts bathed by patient: Right arm, Left arm, Chest, Abdomen, Front perineal area, Buttocks, Right upper leg, Left upper leg, Right lower leg, Left lower leg, Face         Bathing assist Assist Level: Independent with assistive device     Upper Body Dressing/Undressing Upper body dressing   What is the patient wearing?: Pull over shirt    Upper body assist Assist Level: Independent    Lower Body Dressing/Undressing Lower body dressing      What is the patient wearing?: Underwear/pull up, Pants  Lower body assist Assist for lower body dressing: Independent     Toileting Toileting    Toileting assist Assist for toileting: Independent     Transfers Chair/bed transfer  Transfers assist     Chair/bed transfer assist level: Independent with assistive device     Locomotion Ambulation   Ambulation assist      Assist level: Supervision/Verbal cueing Assistive device: No Device Max distance: 200 ft   Walk 10 feet activity   Assist     Assist level: Supervision/Verbal cueing Assistive device: No Device   Walk 50 feet activity   Assist    Assist level: Supervision/Verbal cueing Assistive device: No Device    Walk 150 feet activity   Assist    Assist level: Supervision/Verbal cueing Assistive device: No Device    Walk 10 feet on uneven surface  activity   Assist Walk 10 feet on uneven surfaces activity did not occur: Safety/medical concerns   Assist level: Supervision/Verbal cueing     Wheelchair     Assist Is the patient using a wheelchair?: No   Wheelchair activity did not occur: N/A         Wheelchair 50 feet with 2 turns activity    Assist    Wheelchair 50 feet with 2 turns activity did not occur: N/A       Wheelchair 150 feet activity     Assist  Wheelchair 150 feet activity  did not occur: N/A       Blood pressure (!) 160/92, pulse 88, temperature 98.5 F (36.9 C), temperature source Oral, resp. rate 18, height 5\' 10"  (1.778 m), weight 117.8 kg, SpO2 97%.  Medical Problem List and Plan: 1. 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  Antithrombotics: -DVT/anticoagulation:  Pharmaceutical: Lovenox added 40mg  daily             -antiplatelet therapy:  DAPT X 3 weeks followed by ASA alone 3. Right sided headaches/Pain Management:  tylenol prn. Using 650mg  BID on average   4. Mood/Behavior/Sleep: LCSW to follow for evaluation and support.              -antipsychotic agents: N/A 5. Neuropsych/cognition: This patient is capable of making decisions on his own behalf. 6. Skin/Wound Care: Routine pressure relief measures.  7. Fluids/Electrolytes/Nutrition: Monitor I/O. Check CMET in am.  8. HTN: Monitor BP QID--continue hydralazine 100mg  q8h, amlodipine 10mg  daily, and labetalol 300mg  q8h.              --was on amlodipine and hydrochlorothiazide PTA.             --supine HTN recorded and contributing to dizziness. Encourage fluid intake --Lying SBP 162/103 w/P-78--> Sitting SBP 119/94 w/P-90. Orthostatic vitals ordered. --Wears TEDs for chronic peripheral edema. Will order binder for use (question autonomic dysfunction)  -8/31-9/1 BPs a little elevated, will see what trend is before adjusting meds, may need hydrochlorothiazide added back? -Still elevated  on max dose amlodipine , hydralazine and labetolol, no diuretics due to elevated BUN and Creat , will trial clonidine - improving , but  orthostatic changes trial TTS patch -03/16/23 BPs variable over the last couple days-- will monitor with recent changes -03/17/23 BPs improving, a little lightheaded yesterday likely from new clonidine patch; no complaints today; monitor Vitals:   03/15/23 0621 03/15/23 1323 03/15/23 1344  03/15/23 1924  BP: (!)  146/97 (!) 143/95 128/64 (!) 148/87   03/16/23 0405 03/16/23 1247 03/16/23 1913 03/17/23 0554  BP: (!) 159/88 (!) 135/91 (!) 148/85 (!) 166/101   03/17/23 0644 03/17/23 1413 03/17/23 1936 03/18/23 0539  BP: 123/74 (!) 134/97 (!) 143/90 (!) 160/92     9. T1DM: Hgb A1C-13.8 and poorly controlled due to lack of insulin pump/insurance issues.  --was on Lantus 40 units once a day w/counting carbs-BS running 300s for a while --Now on Levmir 20 units BID with novolog 2 units ac TID  --Will monitor BS ac/hs and use SSI for elevated BS and titrate as indicated  Increase semglee to 22U , resume meal coverage, keep SSI  ,cont semglee to 25U, pt saw Endo, Dr Allena Katz in [place] used insulin pump PTA needs to get Rx so he can resume  Will see endocrine Dr Allena Katz today after d/c [facility] (last 3)  Recent Labs    03/17/23 2026 03/18/23 0613 03/18/23 0642  GLUCAP 163* 61* 95   10. CKD: SCr- 1.94 (02/2022). Now BUN/SCr 48/3.31---> 30/3.07              --on Sodium bicarb TID for NAGMA-  Improving slowly, ?new baseline ~3 , BUN now in nl range hydration improved     Latest Ref Rng & Units 03/18/2023    6:27 AM 03/12/2023    3:08 PM 03/11/2023    5:41 PM  BMP  Glucose 70 - 99 mg/dL 68  578  469   BUN 6 - 20 mg/dL 19  22  22    Creatinine 0.61 - 1.24 mg/dL 6.29  5.28  4.13   Sodium 135 - 145 mmol/L 138  138  135   Potassium 3.5 - 5.1 mmol/L 3.7  4.1  3.9   Chloride 98 - 111 mmol/L 109  104  106   CO2 22 - 32 mmol/L 22  22  21    Calcium 8.9 - 10.3 mg/dL 8.7  9.4  8.6     11. Hyperlipidemia: LDL-130 (was 396 a year ago). Continue atorvastatin 80mg  daily --Leqvio prior authorization started on acute.  12. Dizziness/Orthostasis: Vestibular evaluation and treat. Denies dizziness, double vision or nausea. Continue PRN meclizine and compazine -03/10/23 per PT Gaye Pollack, negative for BPPV, +orthostasis when standing; has TEDs; advised increased water intake. Monitor 13. Constipation: Being managed with Senna BID and Miralax  daily -03/10/23 LBM , has been taking senna BID - sorbitol today 9/4 -03/17/23 LBM 2 (maybe 3?) days ago but this is normal for him; cont regimen for now 14. Anemia of chronic disease, mild : Hgb 12.5 @ admission. Recheck 03/09/23 showing 12.8; monitor on routine labs     Latest Ref Rng & Units 03/18/2023    6:27 AM 03/12/2023    3:08 PM 03/11/2023    5:41 PM  CBC  WBC 4.0 - 10.5 K/uL 5.7  6.6  6.9   Hemoglobin 13.0 - 17.0 g/dL 24.4  01.0  27.2   Hematocrit 39.0 - 52.0 % 40.1  40.7  36.8   Platelets 150 - 400 K/uL 370  370  320     15. OSA: Has been using CPAP at nights.    LOS: 10 days A FACE TO FACE EVALUATION WAS PERFORMED  Erick Colace 03/18/2023, 9:11 AM

## 2023-03-18 NOTE — Progress Notes (Signed)
Patient ID: Bradley Davis, male   DOB: 1986/03/16, 37 y.o.   MRN: [id number]  SW met with patient to discuss d/c questions or concerns. Patient has all DME. Patient agreeable to meds filled through TOC. No additional questions or concerns. Patient ready for d/c.

## 2023-03-18 NOTE — Progress Notes (Signed)
Inpatient Rehabilitation Care Coordinator Discharge Note   Patient Details  Name: Bradley Davis MRN: [id number] Date of Birth: 11/27/1985   Discharge location: Home  Length of Stay: 10 Days  Discharge activity level: Sup/Mod I  Home/community participation: Mother and family  Patient response WU:[id number] Literacy - How often do you need to have someone help you when you read instructions, pamphlets, or other written material from your doctor or pharmacy?: Never  Patient response YN:[id number] Isolation - How often do you feel lonely or isolated from those around you?: Never  Services provided included: MD, PT, OT, SLP, RN, TR, Pharmacy, CM, RD, SW  Financial Services:  Financial Services Utilized: Medicaid    Choices offered to/list presented to: patient  Follow-up services arranged:  DME, Outpatient    Outpatient Servicies: [facility] PT OT DME : Patient has all DME    Patient response to transportation need: Is the patient able to respond to transportation needs?: Yes In the past 12 months, has lack of transportation kept you from medical appointments or from getting medications?: No In the past 12 months, has lack of transportation kept you from meetings, work, or from getting things needed for daily living?: No   Patient/Family verbalized understanding of follow-up arrangements:  Yes  Individual responsible for coordination of the follow-up plan: self  Confirmed correct DME delivered: Andria Rhein 03/18/2023    Comments (or additional information):  Summary of Stay    Date/Time Discharge Planning CSW  03/12/23 1318 Patient anticpates discharging home at MOD I level. If requiring supervision patient plans to arrange with his mother and family to provide supervision. CJB       Andria Rhein

## 2023-03-18 NOTE — Progress Notes (Addendum)
Inpatient Rehabilitation Discharge Medication Review by a Pharmacist  A complete drug regimen review was completed for this patient to identify any potential clinically significant medication issues.  High Risk Drug Classes Is patient taking? Indication by Medication  Antipsychotic No   Anticoagulant No   Antibiotic No   Opioid No   Antiplatelet Yes Plavix/Aspirin- planned for 3 weeks, then ASA alone  (stop 9/15) - CVA   Hypoglycemics/insulin Yes Insulin glargine, aspart TID WC- Diabetes   Vasoactive Medication Yes Labetolol, amlodipine, hydralazine, clonidine- HTN   Chemotherapy No   Other Yes Lipitor- HLD  Sodium bicarbonate- metabolic acidosis/CKD  Vit D- deficiency      Type of Medication Issue Identified Description of Issue Recommendation(s)  Drug Interaction(s) (clinically significant)     Duplicate Therapy     Allergy     No Medication Administration End Date     Incorrect Dose     Additional Drug Therapy Needed     Significant med changes from prior encounter (inform family/care partners about these prior to discharge).    Other       Clinically significant medication issues were identified that warrant physician communication and completion of prescribed/recommended actions by midnight of the next day:  Yes- hydralazine to be continued at discharge   Name of provider notified for urgent issues identified: Delle Reining   Provider Method of Notification:  Phone    Pharmacist comments:  Confirmed patient will discharge on hydralazine for HTN   Time spent performing this drug regimen review (minutes):  20 minutes    Jani Gravel, PharmD Clinical Pharmacist  03/18/2023 8:05 AM

## 2023-03-18 NOTE — Plan of Care (Signed)
  Problem: RH Floor Transfers Goal: LTG Patient will perform floor transfers w/assist (PT) Description: LTG: Patient will perform floor transfers with assistance (PT). 03/18/2023 1022 by Loel Dubonnet, PT Outcome: Completed/Met 03/18/2023 0756 by Loel Dubonnet, PT Outcome: Not Met (add Reason) Note: Pt continues with some n/v and some orthostatic hypotension after changes in position - goal deemed not appropriate at this time. Will need to work with OPPT on floor transfers.

## 2023-03-19 ENCOUNTER — Telehealth: Payer: Self-pay

## 2023-03-19 NOTE — Telephone Encounter (Signed)
error 

## 2023-03-19 NOTE — Telephone Encounter (Signed)
Transitional Care Call--who you spoke with Germaine M. Mclarney (Return phone call).   Are you/is patient experiencing any problems since coming home? No.  Are there any questions regarding any aspect of care? No. Are there any questions regarding medications administration/dosing?No. Are meds being taken as prescribed? Yes. Patient should review meds with caller to confirm. Done. Have there been any falls? No. Has Home Health been to the house and/or have they contacted you? No.  If not, have you tried to contact them? [facility] phone number provided. . Can we help you contact them?  No. Are bowels and bladder emptying properly? Yes. Are there any unexpected incontinence issues?No.  If applicable, is patient following bowel/bladder programs? N/A. Any fevers, problems with breathing, unexpected pain? No. Are there any skin problems or new areas of breakdown? None.  Has the patient/family member arranged specialty MD follow up (ie cardiology/neurology/renal/surgical/etc)? Yes.   Can we help arrange? Follow up phone contacts given.  Does the patient need any other services or support that we can help arrange? No. Are caregivers following through as expected in assisting the patient? Yes.        11. Has the patient quit smoking, drinking alcohol, or using drugs as recommended? Patient advised.   Appointment  03/27/2023 with Jacalyn Lefevre NP at 10:40 AM, [address]

## 2023-03-19 NOTE — Telephone Encounter (Deleted)
Transition Care Management Unsuccessful Follow-up Telephone Call  Date of discharge and from where:  [facility] 03/18/2023  Attempts:  1st Attempt.  2nd & 3rd Attempt made.  No response.   Reason for unsuccessful TCM follow-up call:  Left voice message

## 2023-03-19 NOTE — Telephone Encounter (Signed)
Transitional Care Call--who you spoke with    Are you/is patient experiencing any problems since coming home?  Are there any questions regarding any aspect of care? Are there any questions regarding medications administration/dosing? Are meds being taken as prescribed?  Patient should review meds with caller to confirm.  Have there been any falls?  Has Home Health been to the house and/or have they contacted you?. If not, have you tried to contact them? No. Can we help you contact them?   Are bowels and bladder emptying properly? Yes. Are there any unexpected incontinence issues?  If applicable, is patient following bowel/bladder programs?  Any fevers, problems with breathing, unexpected pain?  Are there any skin problems or new areas of breakdown?  Has the patient/family member arranged specialty MD follow up (ie cardiology/neurology/renal/surgical/etc)? Mother given discharge phone numbers.  Can we help arrange?  Does the patient need any other services or support that we can help arrange?  Are caregivers following through as expected in assisting the patient?         11. Has the patient quit smoking, drinking alcohol, or using drugs as recommended?    Appointment  [address]

## 2023-03-27 ENCOUNTER — Encounter: Payer: Medicaid Other | Attending: Registered Nurse | Admitting: Registered Nurse

## 2023-03-27 ENCOUNTER — Encounter: Payer: Self-pay | Admitting: Registered Nurse

## 2023-03-27 VITALS — BP 131/81 | HR 88 | Ht 70.0 in | Wt 244.0 lb

## 2023-03-27 DIAGNOSIS — N170 Acute kidney failure with tubular necrosis: Secondary | ICD-10-CM | POA: Diagnosis not present

## 2023-03-27 DIAGNOSIS — I639 Cerebral infarction, unspecified: Secondary | ICD-10-CM | POA: Diagnosis present

## 2023-03-27 DIAGNOSIS — N1831 Chronic kidney disease, stage 3a: Secondary | ICD-10-CM | POA: Insufficient documentation

## 2023-03-27 DIAGNOSIS — I10 Essential (primary) hypertension: Secondary | ICD-10-CM | POA: Diagnosis present

## 2023-03-27 NOTE — Therapy (Unsigned)
OUTPATIENT OCCUPATIONAL THERAPY NEURO EVALUATION  Patient Name: Bradley Davis MRN: [id number] DOB:1986/01/02, 37 y.o., male Today's Date: 03/28/2023  PCP: Darnelle Going FNP REFERRING PROVIDER: Dr. Wynn Banker  END OF SESSION:  OT End of Session - 03/28/23 1327     Visit Number 1    Number of Visits 9    Date for OT Re-Evaluation 06/20/23    Authorization Type [employer]    OT Start Time 1320    OT Stop Time 1358    OT Time Calculation (min) 38 min    Activity Tolerance Other (comment)    Behavior During Therapy WFL for tasks assessed/performed             Past Medical History:  Diagnosis Date   CKD stage 3a, GFR 45-59 ml/min (HCC)    Diabetes mellitus    Hypertension    Necrotizing fasciitis (HCC) 04/2021   right groin   Past Surgical History:  Procedure Laterality Date   INCISION AND DRAINAGE ABSCESS Right 04/21/2021   Procedure: INCISION, DRAINAGE, DEBRIDEMENT OF RIGHT THIGH/GROIN INFECTION;  Surgeon: Abigail Miyamoto, MD;  Location: [facility];  Service: General;  Laterality: Right;   IRRIGATION AND DEBRIDEMENT ABSCESS Right 04/23/2021   Procedure: IRRIGATION AND DEBRIDEMENT RIGHT GROIN WOUND; RIGHT THIGH DRESSING CHANGE;  Surgeon: Abigail Miyamoto, MD;  Location: [facility];  Service: General;  Laterality: Right;   IRRIGATION AND DEBRIDEMENT ABSCESS Right 04/26/2021   Procedure: reexploration and debridement of soft tissue infection right groin;  Surgeon: Emelia Loron, MD;  Location: [facility];  Service: General;  Laterality: Right;   RETINAL DETACHMENT SURGERY  2022   TONSILLECTOMY  2003   Patient Active Problem List   Diagnosis Date Noted   Constipation 03/18/2023   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   OSA (obstructive sleep apnea) 03/06/2023   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   Intractable nausea and vomiting 02/28/2023   Acute renal failure superimposed on stage 3a chronic kidney disease (HCC) 02/27/2023   DM (diabetes mellitus), type 1 with renal  complications (HCC) 04/23/2021   Essential hypertension 04/23/2021   Necrotizing fasciitis (HCC) 04/22/2021    ONSET DATE: 02/27/23  REFERRING DIAG: I69.30 (ICD-10-CM) - Unspecified sequelae of cerebral infarction  THERAPY DIAG:  Unsteadiness on feet - Plan: Ot plan of care cert/re-cert  Other lack of coordination - Plan: Ot plan of care cert/re-cert  Muscle weakness (generalized) - Plan: Ot plan of care cert/re-cert  Other abnormalities of gait and mobility - Plan: Ot plan of care cert/re-cert  Rationale for Evaluation and Treatment: Rehabilitation  SUBJECTIVE:   SUBJECTIVE STATEMENT: 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 Pt was hypertensive on evaluation with elevated creatinine. Pt was admitted for AKI and CKD stage III. 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 PMH: DM 1, HTN, CKD III.Pt received therapies at [facility] and was d/c 03/18/23  PRECAUTIONS: Fall and Other: orthostatic hypotension at times dyscoordination of RLE d/t central balance impairment   WEIGHT BEARING RESTRICTIONS: No  PAIN:  Are you having pain? No  FALLS: Has patient fallen in last 6 months? No  LIVING ENVIRONMENT: Lives with: lives alone Lives in: House/apartment   PLOF: Independent  PATIENT GOALS: return to work, improve ability to perform work activities  OBJECTIVE:   HAND DOMINANCE: Left  ADLs: Overall ADLs: increased time required Transfers/ambulation related to  ADLs: Eating: mod I  Grooming: mod I UB Dressing: mod I LB Dressing: mod I Toileting: mod I at home no assistive device Bathing: shower chair Tub Shower transfers: mod I   IADLs: Shopping: assistance for transportation Light housekeeping: has performed light cleaning of sink and toilet, no back to baseline due to balance Meal Prep: has performed  very simple cooking, fatigued quickly Community mobility: mod I with rollator Medication management: mod I Financial management: mod I Handwriting: 100% legible  MOBILITY STATUS:  mod I with rollator  ACTIVITY TOLERANCE: Activity tolerance: decreased for work activities, pt does not have the standing tolerance and balance for work activities at this time  FUNCTIONAL OUTCOME MEASURES: Quick Dash: 4.5% disability UPPER EXTREMITY ROM:  WFL for bilateral UE's    UPPER EXTREMITY MMT:   4+/5 overall for bilateral UE's    HAND FUNCTION: Grip strength: Right: 100 lbs; Left: 90 lbs  COORDINATION: 9 Hole Peg test: Right: 20.94 sec; Left: 24.18 sec  SENSATION: Intermittent tingling in left hand , sensation appears intact    COGNITION: Overall cognitive status:  denies short term memory challenges     VISION ASSESSMENT: Denies changes      PRAXIS: Impaired: Motor planning  OBSERVATIONS: Pt was pleasant and agreeable to participate in evaluation. 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   TODAY'S TREATMENT:                                                                                                                              DATE: 03/27/23- eval only   PATIENT EDUCATION: Education details: role of OT and potential goals Person educated: Patient Education method: Explanation Education comprehension: verbalized understanding  HOME EXERCISE PROGRAM: N/a   GOALS: Goals reviewed with patient? Yes  SHORT TERM GOALS: Target date: 04/26/23  I with initial HEP. Baseline:dependent Goal status: INITIAL  2.  Pt will increase LUE grip strength by 10 lbs for increased functional use. Baseline: Grip strength: Right: 100 lbs; Left: 90 lbs Goal status: INITIAL  3.  Pt will demonstrate improved fine motor coordination  as evidenced by decreasing LUE 9 hole peg test score to 21 secs or less Baseline: 9 Hole Peg test: Right: 20.94 sec; Left: 24.18  sec Goal status: INITIAL    LONG TERM GOALS: Target date: 06/20/23  I with updated HEP Baseline:dependent Goal status: INITIAL  2.  Pt will perform simulated work activities modified independently without LOB. Baseline: dependent, has not returned to work Goal status: INITIAL  3.  Pt will perform mod complex home management and  activities modified independently. Baseline: Pt has only performed very light cooking and home management, no back to baseline. Goal status: INITIAL    ASSESSMENT:  CLINICAL IMPRESSION: Patient is a 37 y.o. male who was seen today for occupational therapy evaluation for (ICD-10-CM) - Unspecified sequelae of cerebral infarction Pt presented initially to [facility] 02/27/23 with  reports of nausea and vomiting for 2 days. Pt was hypertensive on evaluation with elevated creatinine. Pt was admitted for AKI and CKD stage III. 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 PMH: DM 1, HTN, CKD III.Pt received therapies at [facility] and was d/c 03/18/23. 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 Pt presents with the following deficits: decreased balance, decreased activity tolerance, decreased functional mobility, decreased strength, decreased coordination which impedes performance of ADLS/IADLs. Pt can benefit from skilled occupational therapy to address these deficits in orer to maximize pt's safety and I with daily activities.  PERFORMANCE DEFICITS: in functional skills including ADLs, IADLs, coordination, dexterity, strength, flexibility, Fine motor control, Gross motor control, mobility, balance, endurance, decreased knowledge of precautions, decreased knowledge of use of DME, and UE functional use, , and psychosocial skills including coping strategies, environmental adaptation, habits, interpersonal interactions, and routines and behaviors.   IMPAIRMENTS: are limiting patient from ADLs, IADLs, work, play,  leisure, and social participation.   CO-MORBIDITIES: may have co-morbidities  that affects occupational performance. Patient will benefit from skilled OT to address above impairments and improve overall function.  MODIFICATION OR ASSISTANCE TO COMPLETE EVALUATION: No modification of tasks or assist necessary to complete an evaluation.  OT OCCUPATIONAL PROFILE AND HISTORY: Detailed assessment: Review of records and additional review of physical, cognitive, psychosocial history related to current functional performance.  CLINICAL DECISION MAKING: LOW - limited treatment options, no task modification necessary  REHAB POTENTIAL: Good  EVALUATION COMPLEXITY: Low    PLAN:  OT FREQUENCY: 1x/week  OT DURATION:  8 sessions over 12 weeks  PLANNED INTERVENTIONS: self care/ADL training, therapeutic exercise, therapeutic activity, neuromuscular re-education, manual therapy, gait training, balance training, functional mobility training, ultrasound, moist heat, cryotherapy, patient/family education, energy conservation, coping strategies training, DME and/or AE instructions, and Re-evaluation  RECOMMENDED OTHER SERVICES: PT  CONSULTED AND AGREED WITH PLAN OF CARE: Patient  PLAN FOR NEXT SESSION: initial HEP, dynamic balance activities, core stability   Almarie Kurdziel, OT 03/28/2023, 2:42 PM

## 2023-03-27 NOTE — Progress Notes (Signed)
Subjective:    Patient ID: Bradley Davis, male    DOB: 04/06/1986, 37 y.o.   MRN: [id number]  HPI: Bradley Davis is a 37 y.o. male who is here for Transitional Care Visit for follow up of his Cerebellar Stroke, Essential Hypertension and Acute Renal Failure superimposed on stage 3 chronic Kidney disease. He went to [facility] ED with complaints of Nausea and Vomiting and Transferred to [facility].   Dr Arlean Hopping H&P: 02/27/2023 HPI: Bradley Davis is a 37 y.o. male with medical history significant for type 1 diabetes mellitus, essential pretension, CKD 3A with baseline creatinine 1.5-1.9, who is admitted to [facility] on 02/27/2023 by way of transfer from [facility] with acute kidney injury superimposed on CKD 3A after presenting from home to the latter facility complaining of nausea/vomiting.   The patient reports 2 days of recurrent nausea resulting in at least 3-4 episodes of nonbloody, nonbilious emesis over that timeframe, with most recent such episode occurring at [facility] earlier this evening.  In this setting, he reports that he has been unable to tolerate any food, water over the last day, and also notes that he has been unable to take any of his outpatient oral medications over that timeframe as consequence of associated ability to tolerate p.o.  Denies any associated subjective fever, chills, rigors, or generalized myalgias, nor any associated abdominal discomfort, or diarrhea.  Continues to produce urine, and denies any recent dysuria, gross hematuria, or change in urinary urgency/frequency.     He reports some mild lightheadedness over the last day when attempting to rise from seated to standing position, but this has not been associated with a presyncope, syncope, or fall.  He also denies any associated acute focal weakness, acute focal numbness, paresthesias, vertigo,, dysarthria, dysphagia, acute change in vision, or facial droop.   He also  denies any recent chest pain, shortness of breath, palpitations.    Per chart review, most recent prior serum creatinine data point was noted to be 1.94 on 02/15/2022.  Prior to that, creatinine was 1.48 on 05/01/2021.  He was hospitalized in October 2022, with hospital course notable for acute kidney injury, with peak serum creatinine during that hospitalization noted to be 3.17 on 04/23/2021.   Outpatient medications notable for benazepril.   [facility] ED Course:  Vital signs in the ED were notable for the following: Afebrile; heart rates in the 90s; systolic blood pressures in the 170s to 180s; respiratory rate 19-20, oxygen saturation 99 to 100% on room air.   Labs were notable for the following: CMP notable for the following: Sodium 135, potassium 3.8, bicarbonate 23, anion gap 9, creatinine 3.31, BUN/creatinine ratio 14.5, glucose 241, calcium, adjusted for mild hypoalbuminemia noted to be 10.0, abdomen 3.0, otherwise, liver enzymes were within normal limits.  CBC notable for will with cell count 7700.  Urinalysis showed small hemoglobin with no RBCs, no WBCs, leukocyte esterase/nitrate negative, greater than 300 protein, ketones negative, and specific gravity 1.020.  COVID, influenza, RSV PCR all negative.  Initial CBG 329, with repeat trending down to 309.   Per my interpretation, EKG in ED demonstrated the following: No EKG performed today   Imaging in the ED, per corresponding formal radiology read, was notable for the following: No imaging was performed today.    While in the ED, the following were administered: Acetaminophen 650 mg p.o. x 2 doses, Zofran 8 mg ODT x 1  dose, normal saline x 2 L bolus.   Subsequently, the patient was admitted for further evaluation management of presenting acute kidney injury superimposed on CKD 3A in the setting of presenting intractable nausea/vomiting.   CT Head: WO Contrast IMPRESSION: 1. Scalp soft tissue swelling posteriorly and near  the vertex without underlying fracture. This appears to represent an inflammatory or infectious process rather than trauma. 2. Normal CT appearance of the brain.    MR Brain : WO Contrast IMPRESSION: 1. Small acute infarct in the lateral right medulla. 2. Small subacute infarct in the deep right cerebral white matter adjacent to the lateral ventricle. 3. Mild chronic white matter disease.   MRA:  IMPRESSION: Normal MRA of the head and neck.      Mr. Hrncir was admitted to inpatient rehabilitation on 03/08/2023 and discharged home on 03/18/2023. He denies any pain at this time . He rates his pain 2 on Health and History. Also reports he has a good appetite.   This provider called [facility] and [facility] to schedule Outpatient Therapy. He has a scheduled appointment and verbalizes understanding.    He is walking with walker.     Pain Inventory Average Pain 0 Pain Right Now 2 My pain is intermittent, sharp, burning, dull, stabbing, tingling, and aching  LOCATION OF PAIN right head pain  BOWEL Number of stools per week: 2 Oral laxative use Yes  Type of laxative Colace    BLADDER Normal    Mobility use a walker ability to climb steps?  yes do you drive?  no Do you have any goals in this area?  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  yes  Neuro/Psych weakness tingling trouble walking dizziness  Prior Studies Any changes since last visit?  no  Physicians involved in your care Any changes since last visit?  no   Family History  Problem Relation Age of Onset   Cancer Mother    Congestive Heart Failure Father    Cancer Sister    Social History   Socioeconomic History   Marital status: Single    Spouse name: Not on file   Number of children: Not on file   Years of education: Not on file   Highest education level: Not on file   Occupational History   Not on file  Tobacco Use   Smoking status: Never   Smokeless tobacco: Never  Vaping Use   Vaping status: Never Used  Substance and Sexual Activity   Alcohol use: Not Currently    Comment: socially   Drug use: No   Sexual activity: Not on file  Other Topics Concern   Not on file  Social History Narrative   Not on file   Social Determinants of Health   Financial Resource Strain: Not on file  Food Insecurity: No Food Insecurity (02/27/2023)   Hunger Vital Sign    Worried About Running Out of Food in the Last Year: Never true    Ran Out of Food in the Last Year: Never true  Transportation Needs: No Transportation Needs (02/27/2023)   PRAPARE - Administrator, Civil Service (Medical): No    Lack of Transportation (Non-Medical): No  Physical Activity: Not on file  Stress: Not on file  Social Connections: Unknown (11/17/2021)   [facility] from [facility]   Social Network  Social Network: Not on file   Past Surgical History:  Procedure Laterality Date   INCISION AND DRAINAGE ABSCESS Right 04/21/2021   Procedure: INCISION, DRAINAGE, DEBRIDEMENT OF RIGHT THIGH/GROIN INFECTION;  Surgeon: Abigail Miyamoto, MD;  Location: [facility];  Service: General;  Laterality: Right;   IRRIGATION AND DEBRIDEMENT ABSCESS Right 04/23/2021   Procedure: IRRIGATION AND DEBRIDEMENT RIGHT GROIN WOUND; RIGHT THIGH DRESSING CHANGE;  Surgeon: Abigail Miyamoto, MD;  Location: [facility];  Service: General;  Laterality: Right;   IRRIGATION AND DEBRIDEMENT ABSCESS Right 04/26/2021   Procedure: reexploration and debridement of soft tissue infection right groin;  Surgeon: Emelia Loron, MD;  Location: [facility];  Service: General;  Laterality: Right;   RETINAL DETACHMENT SURGERY  2022   TONSILLECTOMY  2003   Past Medical History:  Diagnosis Date   CKD stage 3a, GFR 45-59 ml/min (HCC)    Diabetes mellitus    Hypertension    Necrotizing fasciitis (HCC) 04/2021    right groin   There were no vitals taken for this visit.  Opioid Risk Score:   Fall Risk Score:  `1  Depression screen [facility] 2/9     06/06/2021    2:09 PM  Depression screen PHQ 2/9  Decreased Interest 0  Down, Depressed, Hopeless 0  PHQ - 2 Score 0  Altered sleeping 1  Tired, decreased energy 1  Change in appetite 0  Feeling bad or failure about yourself  0  Trouble concentrating 0  Moving slowly or fidgety/restless 0  Suicidal thoughts 0  PHQ-9 Score 2    Review of Systems  Musculoskeletal:  Positive for gait problem.  Neurological:  Positive for weakness.       Tingling  Psychiatric/Behavioral:         Anxiety  All other systems reviewed and are negative.      Objective:   Physical Exam Vitals and nursing note reviewed.  Constitutional:      Appearance: Normal appearance. He is obese.  Cardiovascular:     Rate and Rhythm: Normal rate and regular rhythm.     Pulses: Normal pulses.     Heart sounds: Normal heart sounds.     No friction rub.  Pulmonary:     Effort: Pulmonary effort is normal.     Breath sounds: Normal breath sounds.  Musculoskeletal:     Cervical back: Normal range of motion and neck supple.     Comments: Normal Muscle Bulk and Muscle Testing Reveals:  Upper Extremities: Full ROM and Muscle Strength 5/5  Lower Extremities : Full ROM and Muscle Strength 5/5 Wearing abdominal binder  Arises from Chair slowly using walker for support Narrow Based Gait     Skin:    General: Skin is warm and dry.  Neurological:     Mental Status: He is alert and oriented to person, place, and time.  Psychiatric:        Mood and Affect: Mood normal.        Behavior: Behavior normal.         Assessment & Plan:  Cerebellar Stroke: He has a scheduled appointment with Neurology. Outpatient Therapy called today he has a scheduled appointment/ He verbalizes understanding. Continue current medication regimen. Continue to Monitor.   Essential Hypertension:  Continue current medication regimen. He will scheduled appointment with PCP. Continue current medication regimen. He verbalizes understanding. Continue to Monitor.   Acute Renal Failure superimposed on stage 3 chronic Kidney disease.PCP will follow. Continue to Monitor.   F/U with Dr Wynn Banker  in 4- 6 weeks

## 2023-03-27 NOTE — Patient Instructions (Signed)
Call Primary Care Physician to schedule Hospital Follow up appointment

## 2023-03-28 ENCOUNTER — Ambulatory Visit: Payer: Medicaid Other | Attending: Physical Medicine and Rehabilitation | Admitting: Occupational Therapy

## 2023-03-28 DIAGNOSIS — R278 Other lack of coordination: Secondary | ICD-10-CM | POA: Diagnosis present

## 2023-03-28 DIAGNOSIS — R2681 Unsteadiness on feet: Secondary | ICD-10-CM | POA: Diagnosis present

## 2023-03-28 DIAGNOSIS — R2689 Other abnormalities of gait and mobility: Secondary | ICD-10-CM | POA: Diagnosis present

## 2023-03-28 DIAGNOSIS — M6281 Muscle weakness (generalized): Secondary | ICD-10-CM | POA: Diagnosis present

## 2023-04-04 ENCOUNTER — Ambulatory Visit: Payer: Medicaid Other | Admitting: Occupational Therapy

## 2023-04-04 DIAGNOSIS — R2681 Unsteadiness on feet: Secondary | ICD-10-CM

## 2023-04-04 DIAGNOSIS — R278 Other lack of coordination: Secondary | ICD-10-CM

## 2023-04-04 DIAGNOSIS — R2689 Other abnormalities of gait and mobility: Secondary | ICD-10-CM

## 2023-04-04 DIAGNOSIS — M6281 Muscle weakness (generalized): Secondary | ICD-10-CM

## 2023-04-04 NOTE — Therapy (Addendum)
OUTPATIENT OCCUPATIONAL THERAPY NEURO treatment Patient Name: Bradley Davis MRN: [id number] DOB:[birth year], 37 y.o., male Today's Date: 04/04/2023  PCP: Darnelle Going FNP REFERRING PROVIDER: Dr. Wynn Banker  END OF SESSION:  OT End of Session - 04/04/23 1530     Visit Number 2    Number of Visits 9    Date for OT Re-Evaluation 06/20/23    Authorization Type [employer]    OT Start Time 1317    OT Stop Time 1400    OT Time Calculation (min) 43 min    Activity Tolerance Other (comment)    Behavior During Therapy WFL for tasks assessed/performed              Past Medical History:  Diagnosis Date   CKD stage 3a, GFR 45-59 ml/min (HCC)    Diabetes mellitus    Hypertension    Necrotizing fasciitis (HCC) 04/2021   right groin   Past Surgical History:  Procedure Laterality Date   INCISION AND DRAINAGE ABSCESS Right 04/21/2021   Procedure: INCISION, DRAINAGE, DEBRIDEMENT OF RIGHT THIGH/GROIN INFECTION;  Surgeon: Abigail Miyamoto, MD;  Location: [facility];  Service: General;  Laterality: Right;   IRRIGATION AND DEBRIDEMENT ABSCESS Right 04/23/2021   Procedure: IRRIGATION AND DEBRIDEMENT RIGHT GROIN WOUND; RIGHT THIGH DRESSING CHANGE;  Surgeon: Abigail Miyamoto, MD;  Location: [facility];  Service: General;  Laterality: Right;   IRRIGATION AND DEBRIDEMENT ABSCESS Right 04/26/2021   Procedure: reexploration and debridement of soft tissue infection right groin;  Surgeon: Emelia Loron, MD;  Location: [facility];  Service: General;  Laterality: Right;   RETINAL DETACHMENT SURGERY  2022   TONSILLECTOMY  2003   Patient Active Problem List   Diagnosis Date Noted   Constipation 03/18/2023   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   OSA (obstructive sleep apnea) 03/06/2023   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   Intractable nausea and vomiting 02/28/2023   Acute renal failure superimposed on stage 3a chronic kidney disease (HCC) 02/27/2023   DM (diabetes mellitus), type 1 with renal  complications (HCC) 04/23/2021   Essential hypertension 04/23/2021   Necrotizing fasciitis (HCC) 04/22/2021    ONSET DATE: 02/27/23  REFERRING DIAG: I69.30 (ICD-10-CM) - Unspecified sequelae of cerebral infarction  THERAPY DIAG:  Other lack of coordination  Muscle weakness (generalized)  Unsteadiness on feet  Other abnormalities of gait and mobility  Rationale for Evaluation and Treatment: Rehabilitation  SUBJECTIVE:   SUBJECTIVE STATEMENT: Pt reports he  worked some last week Pt accompanied by: self  PERTINENT HISTORY: Pt presented initially to [facility] 02/27/23 with reports of nausea and vomiting for 2 days. Pt was hypertensive on evaluation with elevated creatinine. Pt was admitted for AKI and CKD stage III. 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 PMH: DM 1, HTN, CKD III.Pt received therapies at [facility] and was d/c 03/18/23  PRECAUTIONS: Fall and Other: orthostatic hypotension at times dyscoordination of RLE d/t central balance impairment   WEIGHT BEARING RESTRICTIONS: No  PAIN:  Are you having pain? No  FALLS: Has patient fallen in last 6 months? No  LIVING ENVIRONMENT: Lives with: lives alone Lives in: House/apartment   PLOF: Independent  PATIENT GOALS: return to work, improve ability to perform work activities  OBJECTIVE:   HAND DOMINANCE: Left  ADLs: Overall ADLs: increased time required Transfers/ambulation related to ADLs: Eating: mod I  Grooming: mod I UB Dressing: mod I LB Dressing: mod I Toileting: mod I at home no assistive device Bathing: shower chair Tub  Shower transfers: mod I   IADLs: Shopping: assistance for transportation Light housekeeping: has performed light cleaning of sink and toilet, no back to baseline due to balance Meal Prep: has performed very simple cooking, fatigued quickly Community mobility: mod I with rollator Medication management: mod I Financial management: mod  I Handwriting: 100% legible  MOBILITY STATUS:  mod I with rollator  ACTIVITY TOLERANCE: Activity tolerance: decreased for work activities, pt does not have the standing tolerance and balance for work activities at this time  FUNCTIONAL OUTCOME MEASURES: Quick Dash: 4.5% disability UPPER EXTREMITY ROM:  WFL for bilateral UE's    UPPER EXTREMITY MMT:   4+/5 overall for bilateral UE's    HAND FUNCTION: Grip strength: Right: 100 lbs; Left: 90 lbs  COORDINATION: 9 Hole Peg test: Right: 20.94 sec; Left: 24.18 sec  SENSATION: Intermittent tingling in left hand , sensation appears intact    COGNITION: Overall cognitive status:  denies short term memory challenges     VISION ASSESSMENT: Denies changes      PRAXIS: Impaired: Motor planning  OBSERVATIONS: Pt was pleasant and agreeable to participate in evaluation. 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   TODAY'S TREATMENT:                                                                                                                              DATE: 04/04/23- Pt was instructed in HEP for core stability and UE strength, min v.c and demonstration, 10 reps each. Standing in corner, holding 2.2 lbs ball pt performed diagonals each direction x 10 reps each, with close supervision for core stability and strength Arm bike x 6 mins level 3 for conditioning. Putty HEP  for sustained grip and pinch issued min v.c    03/27/23- eval only   PATIENT EDUCATION: Education details:  HEP, see pt insturctions Person educated: Patient Education method: Explanation, need to issue handout next visit Education comprehension: verbalized understanding, returned demonstration, min v.c  HOME EXERCISE PROGRAM: N/a   GOALS: Goals reviewed with patient? Yes  SHORT TERM GOALS: Target date: 04/26/23  I with initial HEP. Baseline:dependent Goal status: INITIAL  2.  Pt will increase LUE grip strength by 10 lbs for  increased functional use. Baseline: Grip strength: Right: 100 lbs; Left: 90 lbs Goal status: INITIAL  3.  Pt will demonstrate improved fine motor coordination  as evidenced by decreasing LUE 9 hole peg test score to 21 secs or less Baseline: 9 Hole Peg test: Right: 20.94 sec; Left: 24.18 sec Goal status: INITIAL    LONG TERM GOALS: Target date: 06/20/23  I with updated HEP Baseline:dependent Goal status: INITIAL  2.  Pt will perform simulated work activities modified independently without LOB. Baseline: dependent, has not returned to work Goal status: INITIAL  3.  Pt will perform mod complex home management and  activities modified independently. Baseline: Pt has only performed very light cooking and home  management, no back to baseline. Goal status: INITIAL    ASSESSMENT:  CLINICAL IMPRESSION: Pt is progressing towards goals for strength and core stability. PERFORMANCE DEFICITS: in functional skills including ADLs, IADLs, coordination, dexterity, strength, flexibility, Fine motor control, Gross motor control, mobility, balance, endurance, decreased knowledge of precautions, decreased knowledge of use of DME, and UE functional use, , and psychosocial skills including coping strategies, environmental adaptation, habits, interpersonal interactions, and routines and behaviors.   IMPAIRMENTS: are limiting patient from ADLs, IADLs, work, play, leisure, and social participation.   CO-MORBIDITIES: may have co-morbidities  that affects occupational performance. Patient will benefit from skilled OT to address above impairments and improve overall function.  MODIFICATION OR ASSISTANCE TO COMPLETE EVALUATION: No modification of tasks or assist necessary to complete an evaluation.  OT OCCUPATIONAL PROFILE AND HISTORY: Detailed assessment: Review of records and additional review of physical, cognitive, psychosocial history related to current functional performance.  CLINICAL DECISION  MAKING: LOW - limited treatment options, no task modification necessary  REHAB POTENTIAL: Good  EVALUATION COMPLEXITY: Low    PLAN:  OT FREQUENCY: 1x/week  OT DURATION:  8 sessions over 12 weeks  PLANNED INTERVENTIONS: self care/ADL training, therapeutic exercise, therapeutic activity, neuromuscular re-education, manual therapy, gait training, balance training, functional mobility training, ultrasound, moist heat, cryotherapy, patient/family education, energy conservation, coping strategies training, DME and/or AE instructions, and Re-evaluation  RECOMMENDED OTHER SERVICES: PT  CONSULTED AND AGREED WITH PLAN OF CARE: Patient  PLAN FOR NEXT SESSION: review initial HEP, dynamic balance activities, core stability   Jaeley Wiker, OT 04/04/2023, 3:31 PM

## 2023-04-04 NOTE — Patient Instructions (Addendum)
Perform 10 reps of cat and cow Then rocking forwards and backwards while on all 4's- 10  reps   Bird Dog Pose - Intermediate    Keep pelvis stable. From table pose, step one leg back to plank pose. Reach opposite arm forward, back foot off floor.  Hold for __5__ breaths. Return arm and leg at same rate. Repeat __10__ times, alternating sides.  Copyright  VHI. All rights reserved.    Strengthening: Resisted Extension- sit    Sitting Hold tubing in __one ___ hand(s), arm forward. Pull arm back, elbow straight. Repeat _10___ times per set. Do _1-2___ sessions per day, every other day.   Resisted Horizontal Abduction: Bilateral   Sit or stand, tubing in both hands, arms out in front. Keeping arms straight, pinch shoulder blades together and stretch arms out. Repeat _10___ times per set. Do _1-2___ sessions per day, every other day.   Elbow Flexion: Resisted   With tubing held in __one____ hand(s) and other end secured under foot, curl arm up as far as possible. Repeat _10___ times per set. Do _1-2___ sessions per day, every other day.    Elbow Extension: Resisted   Sit in chair with resistive band secured at armrest (or hold with other hand) and ____one___ elbow bent. Straighten elbow. Repeat _10___ times per set.  Do _1-2___ sessions per day, every other day.   1. Grip Strengthening (Resistive Putty)   Squeeze putty using thumb and all fingers. Repeat _20___ times. Do __2__ sessions per day.   2. Roll putty into tube on table and pinch between first two fingers and thumb x 10 reps. Do 2 sessions per day     Copyright  VHI. All rights reserved.   Copyright  VHI. All rights reserved.

## 2023-04-09 ENCOUNTER — Other Ambulatory Visit: Payer: Self-pay | Admitting: Physical Medicine and Rehabilitation

## 2023-04-10 ENCOUNTER — Ambulatory Visit: Payer: Medicaid Other

## 2023-04-10 ENCOUNTER — Other Ambulatory Visit: Payer: Self-pay | Admitting: Physical Medicine and Rehabilitation

## 2023-04-10 ENCOUNTER — Ambulatory Visit: Payer: Medicaid Other | Admitting: Occupational Therapy

## 2023-04-17 ENCOUNTER — Ambulatory Visit: Payer: Medicaid Other | Attending: Physical Medicine and Rehabilitation | Admitting: Occupational Therapy

## 2023-04-17 ENCOUNTER — Ambulatory Visit: Payer: Medicaid Other

## 2023-04-17 ENCOUNTER — Encounter: Payer: Self-pay | Admitting: Occupational Therapy

## 2023-04-17 VITALS — BP 159/102

## 2023-04-17 DIAGNOSIS — R2681 Unsteadiness on feet: Secondary | ICD-10-CM | POA: Insufficient documentation

## 2023-04-17 DIAGNOSIS — R2689 Other abnormalities of gait and mobility: Secondary | ICD-10-CM | POA: Insufficient documentation

## 2023-04-17 DIAGNOSIS — M6281 Muscle weakness (generalized): Secondary | ICD-10-CM | POA: Diagnosis present

## 2023-04-17 DIAGNOSIS — R6889 Other general symptoms and signs: Secondary | ICD-10-CM | POA: Diagnosis present

## 2023-04-17 DIAGNOSIS — I639 Cerebral infarction, unspecified: Secondary | ICD-10-CM | POA: Diagnosis present

## 2023-04-17 DIAGNOSIS — R278 Other lack of coordination: Secondary | ICD-10-CM | POA: Diagnosis present

## 2023-04-17 NOTE — Therapy (Signed)
OUTPATIENT OCCUPATIONAL THERAPY NEURO treatment Patient Name: Bradley Davis MRN: [id number] DOB:12-07-1985, 37 y.o., male Today's Date: 04/17/2023  PCP: Darnelle Going FNP REFERRING PROVIDER: Dr. Wynn Banker  END OF SESSION:  OT End of Session - 04/17/23 1407     Visit Number 3    Date for OT Re-Evaluation 06/20/23    Authorization Type [employer]    Authorization - Visit Number 2    Authorization - Number of Visits 6    OT Start Time 1405    OT Stop Time 1445    OT Time Calculation (min) 40 min              Past Medical History:  Diagnosis Date   CKD stage 3a, GFR 45-59 ml/min (HCC)    Diabetes mellitus    Hypertension    Necrotizing fasciitis (HCC) 04/2021   right groin   Past Surgical History:  Procedure Laterality Date   INCISION AND DRAINAGE ABSCESS Right 04/21/2021   Procedure: INCISION, DRAINAGE, DEBRIDEMENT OF RIGHT THIGH/GROIN INFECTION;  Surgeon: Abigail Miyamoto, MD;  Location: [facility];  Service: General;  Laterality: Right;   IRRIGATION AND DEBRIDEMENT ABSCESS Right 04/23/2021   Procedure: IRRIGATION AND DEBRIDEMENT RIGHT GROIN WOUND; RIGHT THIGH DRESSING CHANGE;  Surgeon: Abigail Miyamoto, MD;  Location: [facility];  Service: General;  Laterality: Right;   IRRIGATION AND DEBRIDEMENT ABSCESS Right 04/26/2021   Procedure: reexploration and debridement of soft tissue infection right groin;  Surgeon: Emelia Loron, MD;  Location: [facility];  Service: General;  Laterality: Right;   RETINAL DETACHMENT SURGERY  2022   TONSILLECTOMY  2003   Patient Active Problem List   Diagnosis Date Noted   Constipation 03/18/2023   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   OSA (obstructive sleep apnea) 03/06/2023   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   Intractable nausea and vomiting 02/28/2023   Acute renal failure superimposed on stage 3a chronic kidney disease (HCC) 02/27/2023   DM (diabetes mellitus), 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   Essential hypertension  04/23/2021   Necrotizing fasciitis (HCC) 04/22/2021    ONSET DATE: 02/27/23  REFERRING DIAG: I69.30 (ICD-10-CM) - Unspecified sequelae of cerebral infarction  THERAPY DIAG:  Other lack of coordination  Muscle weakness (generalized)  Unsteadiness on feet  Other abnormalities of gait and mobility  Rationale for Evaluation and Treatment: Rehabilitation  SUBJECTIVE:   SUBJECTIVE STATEMENT: Pt reports he has been working some. Pt accompanied by: self  PERTINENT HISTORY: Pt presented initially to [facility] 02/27/23 with reports of nausea and vomiting for 2 days. Pt was hypertensive on evaluation with elevated creatinine. Pt was admitted for AKI and CKD stage III. 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 PMH: DM 1, HTN, CKD III.Pt received therapies at [facility] and was d/c 03/18/23  PRECAUTIONS: Fall and Other: orthostatic hypotension at times dyscoordination of RLE d/t central balance impairment   WEIGHT BEARING RESTRICTIONS: No  PAIN:  Are you having pain? No  FALLS: Has patient fallen in last 6 months? No  LIVING ENVIRONMENT: Lives with: lives alone Lives in: House/apartment   PLOF: Independent  PATIENT GOALS: return to work, improve ability to perform work activities  OBJECTIVE:   HAND DOMINANCE: Left  ADLs: Overall ADLs: increased time required Transfers/ambulation related to ADLs: Eating: mod I  Grooming: mod I UB Dressing: mod I LB Dressing: mod I Toileting: mod I at home no assistive device Bathing: shower chair Tub Shower transfers: mod I   IADLs: Shopping:  assistance for transportation Light housekeeping: has performed light cleaning of sink and toilet, no back to baseline due to balance Meal Prep: has performed very simple cooking, fatigued quickly Community mobility: mod I with rollator Medication management: mod I Financial management: mod I Handwriting: 100% legible  MOBILITY STATUS:  mod I with  rollator  ACTIVITY TOLERANCE: Activity tolerance: decreased for work activities, pt does not have the standing tolerance and balance for work activities at this time  FUNCTIONAL OUTCOME MEASURES: Quick Dash: 4.5% disability UPPER EXTREMITY ROM:  WFL for bilateral UE's    UPPER EXTREMITY MMT:   4+/5 overall for bilateral UE's    HAND FUNCTION: Grip strength: Right: 100 lbs; Left: 90 lbs  COORDINATION: 9 Hole Peg test: Right: 20.94 sec; Left: 24.18 sec  SENSATION: Intermittent tingling in left hand , sensation appears intact    COGNITION: Overall cognitive status:  denies short term memory challenges     VISION ASSESSMENT: Denies changes      PRAXIS: Impaired: Motor planning  OBSERVATIONS: Pt was pleasant and agreeable to participate in evaluation. 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   TODAY'S TREATMENT:                                                                                                                              DATE: 04/17/23- Therapist provided education regarding energy conservation and balance seated and standing activities as well as rest/ activity. Pt reports working and sometimes becoming over fatigued. Therapsit recommends seated rest break every hour initally and that he showers in seated to conserve energy. Pt arrived wearing abdominal binder and BP was elevated, see vitals. Binder was removed and BP improved with activity. Standing for functional reaching from low to high surface, in standing with supervision no LOB. Pt had to stop task due to nausea following this task BP 136/92 Following rest break quadraped on mat rocking forwards and backwards, then bird dog with supervision. BP end of session was 161/78   04/04/23- Pt was instructed in HEP for core stability and UE strength, min v.c and demonstration, 10 reps each. Standing in corner, holding 2.2 lbs ball pt performed diagonals each direction x 10 reps each, with close  supervision for core stability and strength Arm bike x 6 mins level 3 for conditioning. Putty HEP  for sustained grip and pinch issued min v.c    03/27/23- eval only   PATIENT EDUCATION: Education details:  education regarding energy conservation Person educated: Patient Education method: Explanation,  Education comprehension: verbalized understanding,  HOME EXERCISE PROGRAM: N/a   GOALS: Goals reviewed with patient? Yes  SHORT TERM GOALS: Target date: 04/26/23  I with initial HEP. Baseline:dependent Goal status: INITIAL  2.  Pt will increase LUE grip strength by 10 lbs for increased functional use. Baseline: Grip strength: Right: 100 lbs; Left: 90 lbs Goal status: INITIAL  3.  Pt will demonstrate improved fine motor coordination  as evidenced by decreasing LUE 9 hole peg test score to 21 secs or less Baseline: 9 Hole Peg test: Right: 20.94 sec; Left: 24.18 sec Goal status: INITIAL    LONG TERM GOALS: Target date: 06/20/23  I with updated HEP Baseline:dependent Goal status: INITIAL  2.  Pt will perform simulated work activities modified independently without LOB. Baseline: dependent, has not returned to work Goal status: INITIAL  3.  Pt will perform mod complex home management and  activities modified independently. Baseline: Pt has only performed very light cooking and home management, no back to baseline. Goal status: INITIAL    ASSESSMENT:  CLINICAL IMPRESSION: Pt is progressing towards goals however pt continues to demonstrate decreased activity tolerance. PERFORMANCE DEFICITS: in functional skills including ADLs, IADLs, coordination, dexterity, strength, flexibility, Fine motor control, Gross motor control, mobility, balance, endurance, decreased knowledge of precautions, decreased knowledge of use of DME, and UE functional use, , and psychosocial skills including coping strategies, environmental adaptation, habits, interpersonal interactions, and routines  and behaviors.   IMPAIRMENTS: are limiting patient from ADLs, IADLs, work, play, leisure, and social participation.   CO-MORBIDITIES: may have co-morbidities  that affects occupational performance. Patient will benefit from skilled OT to address above impairments and improve overall function.  MODIFICATION OR ASSISTANCE TO COMPLETE EVALUATION: No modification of tasks or assist necessary to complete an evaluation.  OT OCCUPATIONAL PROFILE AND HISTORY: Detailed assessment: Review of records and additional review of physical, cognitive, psychosocial history related to current functional performance.  CLINICAL DECISION MAKING: LOW - limited treatment options, no task modification necessary  REHAB POTENTIAL: Good  EVALUATION COMPLEXITY: Low    PLAN:  OT FREQUENCY: 1x/week  OT DURATION:  8 sessions over 12 weeks  PLANNED INTERVENTIONS: self care/ADL training, therapeutic exercise, therapeutic activity, neuromuscular re-education, manual therapy, gait training, balance training, functional mobility training, ultrasound, moist heat, cryotherapy, patient/family education, energy conservation, coping strategies training, DME and/or AE instructions, and Re-evaluation  RECOMMENDED OTHER SERVICES: PT  CONSULTED AND AGREED WITH PLAN OF CARE: Patient  PLAN FOR NEXT SESSION: review initial HEP, dynamic balance activities, core stability   Lyanna Blystone, OT 04/17/2023, 2:20 PM

## 2023-04-24 ENCOUNTER — Emergency Department (HOSPITAL_BASED_OUTPATIENT_CLINIC_OR_DEPARTMENT_OTHER)
Admission: EM | Admit: 2023-04-24 | Discharge: 2023-04-24 | Disposition: A | Discharge status: Home / Self Care | Payer: Medicaid Other | Attending: Emergency Medicine | Admitting: Emergency Medicine

## 2023-04-24 ENCOUNTER — Other Ambulatory Visit: Payer: Self-pay

## 2023-04-24 ENCOUNTER — Encounter (HOSPITAL_BASED_OUTPATIENT_CLINIC_OR_DEPARTMENT_OTHER): Payer: Self-pay | Admitting: Emergency Medicine

## 2023-04-24 ENCOUNTER — Ambulatory Visit: Payer: Medicaid Other | Admitting: Occupational Therapy

## 2023-04-24 ENCOUNTER — Emergency Department (HOSPITAL_BASED_OUTPATIENT_CLINIC_OR_DEPARTMENT_OTHER): Payer: Medicaid Other

## 2023-04-24 ENCOUNTER — Ambulatory Visit: Payer: Medicaid Other

## 2023-04-24 DIAGNOSIS — R278 Other lack of coordination: Secondary | ICD-10-CM | POA: Diagnosis not present

## 2023-04-24 DIAGNOSIS — E1022 Type 1 diabetes mellitus with diabetic chronic kidney disease: Secondary | ICD-10-CM | POA: Insufficient documentation

## 2023-04-24 DIAGNOSIS — R2681 Unsteadiness on feet: Secondary | ICD-10-CM

## 2023-04-24 DIAGNOSIS — Z79899 Other long term (current) drug therapy: Secondary | ICD-10-CM | POA: Diagnosis not present

## 2023-04-24 DIAGNOSIS — N189 Chronic kidney disease, unspecified: Secondary | ICD-10-CM | POA: Diagnosis not present

## 2023-04-24 DIAGNOSIS — I639 Cerebral infarction, unspecified: Secondary | ICD-10-CM

## 2023-04-24 DIAGNOSIS — Z794 Long term (current) use of insulin: Secondary | ICD-10-CM | POA: Insufficient documentation

## 2023-04-24 DIAGNOSIS — Z7902 Long term (current) use of antithrombotics/antiplatelets: Secondary | ICD-10-CM | POA: Insufficient documentation

## 2023-04-24 DIAGNOSIS — Z7982 Long term (current) use of aspirin: Secondary | ICD-10-CM | POA: Diagnosis not present

## 2023-04-24 DIAGNOSIS — R6889 Other general symptoms and signs: Secondary | ICD-10-CM

## 2023-04-24 DIAGNOSIS — I10 Essential (primary) hypertension: Secondary | ICD-10-CM

## 2023-04-24 DIAGNOSIS — I129 Hypertensive chronic kidney disease with stage 1 through stage 4 chronic kidney disease, or unspecified chronic kidney disease: Secondary | ICD-10-CM | POA: Diagnosis present

## 2023-04-24 HISTORY — DX: Cerebral infarction, unspecified: I63.9

## 2023-04-24 LAB — CBC WITH DIFFERENTIAL/PLATELET
Abs Immature Granulocytes: 0.01 10*3/uL (ref 0.00–0.07)
Basophils Absolute: 0.1 10*3/uL (ref 0.0–0.1)
Basophils Relative: 1 %
Eosinophils Absolute: 0.8 10*3/uL — ABNORMAL HIGH (ref 0.0–0.5)
Eosinophils Relative: 11 %
HCT: 37.1 % — ABNORMAL LOW (ref 39.0–52.0)
Hemoglobin: 12.3 g/dL — ABNORMAL LOW (ref 13.0–17.0)
Immature Granulocytes: 0 %
Lymphocytes Relative: 41 %
Lymphs Abs: 2.8 10*3/uL (ref 0.7–4.0)
MCH: 27.3 pg (ref 26.0–34.0)
MCHC: 33.2 g/dL (ref 30.0–36.0)
MCV: 82.3 fL (ref 80.0–100.0)
Monocytes Absolute: 0.6 10*3/uL (ref 0.1–1.0)
Monocytes Relative: 9 %
Neutro Abs: 2.6 10*3/uL (ref 1.7–7.7)
Neutrophils Relative %: 38 %
Platelets: 389 10*3/uL (ref 150–400)
RBC: 4.51 MIL/uL (ref 4.22–5.81)
RDW: 13.3 % (ref 11.5–15.5)
WBC: 6.8 10*3/uL (ref 4.0–10.5)
nRBC: 0 % (ref 0.0–0.2)

## 2023-04-24 LAB — COMPREHENSIVE METABOLIC PANEL
ALT: 24 U/L (ref 0–44)
AST: 29 U/L (ref 15–41)
Albumin: 3.1 g/dL — ABNORMAL LOW (ref 3.5–5.0)
Alkaline Phosphatase: 75 U/L (ref 38–126)
Anion gap: 10 (ref 5–15)
BUN: 26 mg/dL — ABNORMAL HIGH (ref 6–20)
CO2: 24 mmol/L (ref 22–32)
Calcium: 8.7 mg/dL — ABNORMAL LOW (ref 8.9–10.3)
Chloride: 103 mmol/L (ref 98–111)
Creatinine, Ser: 2.88 mg/dL — ABNORMAL HIGH (ref 0.61–1.24)
GFR, Estimated: 28 mL/min — ABNORMAL LOW (ref >60)
Glucose, Bld: 139 mg/dL — ABNORMAL HIGH (ref 70–99)
Potassium: 4 mmol/L (ref 3.5–5.1)
Sodium: 137 mmol/L (ref 135–145)
Total Bilirubin: 0.6 mg/dL (ref 0.3–1.2)
Total Protein: 6.4 g/dL — ABNORMAL LOW (ref 6.5–8.1)

## 2023-04-24 LAB — TROPONIN I (HIGH SENSITIVITY)
Troponin I (High Sensitivity): 32 ng/L — ABNORMAL HIGH (ref <18)
Troponin I (High Sensitivity): 33 ng/L — ABNORMAL HIGH (ref <18)
Troponin I (High Sensitivity): 34 ng/L — ABNORMAL HIGH (ref <18)

## 2023-04-24 MED ORDER — ONDANSETRON 4 MG PO TBDP
4.0000 mg | ORAL_TABLET | Freq: Once | ORAL | Status: AC
  Administered 2023-04-24: 4 mg via ORAL
  Filled 2023-04-24: qty 1

## 2023-04-24 MED ORDER — LABETALOL HCL 100 MG PO TABS
300.0000 mg | ORAL_TABLET | Freq: Once | ORAL | Status: AC
  Administered 2023-04-24: 300 mg via ORAL
  Filled 2023-04-24: qty 3

## 2023-04-24 MED ORDER — HYDRALAZINE HCL 25 MG PO TABS
100.0000 mg | ORAL_TABLET | Freq: Once | ORAL | Status: AC
  Administered 2023-04-24: 100 mg via ORAL
  Filled 2023-04-24: qty 4

## 2023-04-24 MED ORDER — HYDRALAZINE HCL 100 MG PO TABS: 100.0000 mg | ORAL_TABLET | Freq: Three times a day (TID) | ORAL | 0 refills | Status: AC

## 2023-04-24 MED ORDER — LABETALOL HCL 300 MG PO TABS: 300.0000 mg | ORAL_TABLET | Freq: Three times a day (TID) | ORAL | 0 refills | Status: DC

## 2023-04-24 MED ORDER — HYDRALAZINE HCL 100 MG PO TABS: 100.0000 mg | ORAL_TABLET | Freq: Three times a day (TID) | ORAL | 0 refills | Status: DC

## 2023-04-24 MED ORDER — AMLODIPINE BESYLATE 10 MG PO TABS: 10.0000 mg | ORAL_TABLET | Freq: Every day | ORAL | 0 refills | Status: DC

## 2023-04-24 NOTE — Therapy (Signed)
OUTPATIENT PHYSICAL THERAPY NEURO EVALUATION   Patient Name: Bradley Davis MRN: [id number] DOB:[birth year], 37 y.o., male Today's Date: 04/24/2023   PCP: Darnelle Going REFERRING PROVIDER: Delle Reining, PA  END OF SESSION:  PT End of Session - 04/24/23 1716     Visit Number 1    Date for PT Re-Evaluation 07/17/23    Progress Note Due on Visit 10    PT Start Time 1300    PT Stop Time 1340    PT Time Calculation (min) 40 min    Activity Tolerance Treatment limited secondary to medical complications (Comment)    Behavior During Therapy [facility] for tasks assessed/performed             Past Medical History:  Diagnosis Date   CKD stage 3a, GFR 45-59 ml/min (HCC)    Diabetes mellitus    Hypertension    Necrotizing fasciitis (HCC) 04/2021   right groin   Stroke [facility])    Past Surgical History:  Procedure Laterality Date   INCISION AND DRAINAGE ABSCESS Right 04/21/2021   Procedure: INCISION, DRAINAGE, DEBRIDEMENT OF RIGHT THIGH/GROIN INFECTION;  Surgeon: Abigail Miyamoto, MD;  Location: [facility];  Service: General;  Laterality: Right;   IRRIGATION AND DEBRIDEMENT ABSCESS Right 04/23/2021   Procedure: IRRIGATION AND DEBRIDEMENT RIGHT GROIN WOUND; RIGHT THIGH DRESSING CHANGE;  Surgeon: Abigail Miyamoto, MD;  Location: [facility];  Service: General;  Laterality: Right;   IRRIGATION AND DEBRIDEMENT ABSCESS Right 04/26/2021   Procedure: reexploration and debridement of soft tissue infection right groin;  Surgeon: Emelia Loron, MD;  Location: [facility];  Service: General;  Laterality: Right;   RETINAL DETACHMENT SURGERY  2022   TONSILLECTOMY  2003   Patient Active Problem List   Diagnosis Date Noted   Constipation 03/18/2023   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   OSA (obstructive sleep apnea) 03/06/2023   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   Intractable nausea and vomiting 02/28/2023   Acute renal failure superimposed on stage 3a chronic kidney disease (HCC) 02/27/2023   DM  (diabetes mellitus), 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   Essential hypertension 04/23/2021   Necrotizing fasciitis (HCC) 04/22/2021    ONSET DATE: 02/09/23   REFERRING DIAG: cerebellar stroke  THERAPY DIAG:  Muscle weakness (generalized)  Unsteadiness on feet  Rationale for Evaluation and Treatment: Rehabilitation  SUBJECTIVE:                                                                                                                                                                                             SUBJECTIVE STATEMENT: I feel good, I ran out of the BP meds  prescribed in the hospital so when my PCP prescribed the refill they prescribed the meds and dosage that I was on prior to my hospitalization. So I dont get  to the the PCP until 10/29 Pt accompanied by: self  PERTINENT HISTORY: cerebellar stroke also renal disease, hospitalized 8/3/to 9/9/224 for rehab.  Referred to outpt PT  PAIN:  Are you having pain? No  PRECAUTIONS: Other: BP  RED FLAGS: None   WEIGHT BEARING RESTRICTIONS: No  FALLS: Has patient fallen in last 6 months? Yes. Number of falls 4  LIVING ENVIRONMENT: Lives with: lives with their family Lives in: House/apartment Has following equipment at home: Single point cane and Environmental consultant - 4 wheeled  PLOF: Independent  PATIENT GOALS: return to I driving, walking, managing my food truck  OBJECTIVE:  Note: Objective measures were completed at Evaluation unless otherwise noted.  DIAGNOSTIC FINDINGS:copied from hospitalist notes: MRI brain done for workup showing small acute infarct in lateral right medulla and deep right cerebral white matter adjacent to lateral ventricle.   COGNITION: Overall cognitive status: Within functional limits for tasks assessed   SENSATION: WFL  COORDINATION: Alt toe taps, symmetrical toe taps in sitting wnl  EDEMA:  None noted  MUSCLE TONE: all wnl    LOWER EXTREMITY ROM:   all LE ROM  wnl  LOWER EXTREMITY MMT:  burst testing B LE 's wnl  BED MOBILITY: NA today   TRANSFERS: Assistive device utilized: Environmental consultant - 4 wheeled  Sit to stand: Modified independence Stand to sit: Modified independence   GAIT: Gait pattern: lateral lean- Right and wide BOS Distance walked: 80 Assistive device utilized: Walker - 4 wheeled Level of assistance: Modified independence   Monitored BO in lobby,BP 166/106 After walking 40' lobby to clinic: BP 187/115 After walking 80' BP 196/117 Stopped eval at this pt and called PCP to report he was advised to go to urgent care FUNCTIONAL TESTS:  TBD  PATIENT SURVEYS:    TODAY'S TREATMENT:                                                                                                                              DATE: 04/24/23    PATIENT EDUCATION: Education details: POC, goals Person educated: Patient Education method: Explanation, Demonstration, Tactile cues, and Verbal cues Education comprehension: verbalized understanding  HOME EXERCISE PROGRAM: TBD  GOALS: Goals reviewed with patient? Yes  SHORT TERM GOALS: Target date: 2 weeks 05/08/23  IHEP  Baseline:TBD Goal status: INITIAL   LONG TERM GOALS: Target date: 07/17/23  Able to complete FGA and score 30/30 Baseline: TBA Goal status: INITIAL  2.  Able to walk greater than 1000' maintaining pace of 1 m/sec for consistent and efficient community ambulation Baseline: TBD Goal status: INITIAL  3.  Sit to stand 30 sec, 15 sec or more to demonstrate endurance, coordination, functional strength Baseline: TBD Goal status: INITIAL  ASSESSMENT:  CLINICAL IMPRESSION: Patient is a 37 y.o. male who  was participated today in skilled physical therapy evaluation due to cerebellar stroke. Unfortunately his BP was quite elevated and increased with each measurement so in discussion by phone with his PCP he was advised to go to urgent care.  Therefore further evaluation of his  balance, gait tolerance will need to be completed later.  He should be a good candidate for skilled PT , does have some higher level gait deficits , ataxia, and needs further evaluation of his endurance.   OBJECTIVE IMPAIRMENTS: Abnormal gait, cardiopulmonary status limiting activity, decreased balance, and decreased coordination.   ACTIVITY LIMITATIONS: carrying, lifting, standing, squatting, stairs, locomotion level, and caring for others  PARTICIPATION LIMITATIONS: meal prep, cleaning, laundry, driving, occupation, and yard work  PERSONAL FACTORS: Behavior pattern, Past/current experiences, and Time since onset of injury/illness/exacerbation are also affecting patient's functional outcome.   REHAB POTENTIAL: Good  CLINICAL DECISION MAKING: Evolving/moderate complexity  EVALUATION COMPLEXITY: Moderate  PLAN:  PT FREQUENCY: 2x/week  PT DURATION: 12 weeks  PLANNED INTERVENTIONS: 97110-Therapeutic exercises, 97530- Therapeutic activity, [id number]- Neuromuscular re-education, 97535- Self Care, and 51761- Manual therapy  PLAN FOR NEXT SESSION: further assess balance, gait, endurance once cleared medically by MD regarding his BP   Giovanna Kemmerer L Terika Pillard, PT, DPT, OCS 04/24/2023, 5:18 PM

## 2023-04-24 NOTE — ED Provider Notes (Signed)
[place] [facility] AT [facility] Provider Note   CSN: [id number] Arrival date & time: 04/24/23  1417     History  Chief Complaint  Patient presents with   Hypertension    Bradley Davis is a 37 y.o. male.  37 year old male presents with concern for elevated BP at PT today. Patient was hospitalized in August and was dc home on medications (through [facility]). Patient has not followed up with PCP since dc (is scheduled to) and requested refill of his medications, PCP ([facility]) refilled his meds but it is not was he had been taking since dc that was working well.  Reports headache, spitting (states his saliva is thick). Denies CP, SHOB, edema, urine output normal, no vision changes, denies unilateral weakness/numbness.   Benazepril and Amlodipine, atorvastatin were refilled. Previously on Hydralazine, Labetalol, and amlodipine.  Missing hydralazine and labetalol.        Home Medications Prior to Admission medications   Medication Sig Start Date End Date Taking? Authorizing Provider  acetaminophen (TYLENOL) 325 MG tablet Take 650 mg by mouth as needed for mild pain or headache.    [provider]  amLODipine (NORVASC) 10 MG tablet Take 1 tablet (10 mg total) by mouth daily. 04/24/23 05/24/23  Jeannie Fend, PA-C  aspirin EC 81 MG tablet Take 1 tablet (81 mg total) by mouth daily. Swallow whole. 03/18/23   Love, Evlyn Kanner, PA-C  atorvastatin (LIPITOR) 80 MG tablet Take 1 tablet (80 mg total) by mouth daily with supper. 03/18/23   Love, Evlyn Kanner, PA-C  cloNIDine (CATAPRES - DOSED IN MG/24 HR) 0.1 mg/24hr patch Place 1 patch (0.1 mg total) onto the skin once a week. Change every Friday 03/22/23   Jacquelynn Cree, PA-C  clopidogrel (PLAVIX) 75 MG tablet Take 1 tablet (75 mg total) by mouth daily. 03/18/23   Love, Evlyn Kanner, PA-C  Continuous Glucose Sensor (DEXCOM G7 SENSOR) MISC Use 1 sensor every 10 days. 03/18/23   [provider]  [employer] ASPIRIN EC 81 MG  tablet SMARTSIG:1 Tablet(s) By Mouth Daily 03/18/23   [provider]  hydrALAZINE (APRESOLINE) 100 MG tablet Take 1 tablet (100 mg total) by mouth every 8 (eight) hours. 04/24/23 05/24/23  Army Melia A, PA-C  insulin aspart (NOVOLOG) 100 UNIT/ML injection USE AS DIRECTED IN INSULIN PUMP. MAX DAILY DOSE IS 150 UNITS 03/18/23   [provider]  Insulin Disposable Pump (OMNIPOD 5 G6 INTRO, GEN 5,) KIT Use for insulin administration.  Patient will be using Dexcom G7. 03/19/23   [provider]  Insulin Disposable Pump (OMNIPOD 5 G6 PODS, GEN 5,) MISC Use one pod every 72 hours for insulin administration.  Patient will be using Dexcom G7 CGM. 03/19/23   [provider]  Insulin Glargine (BASAGLAR KWIKPEN) 100 UNIT/ML Inject 25 Units into the skin 2 (two) times daily. 03/18/23   Love, Evlyn Kanner, PA-C  labetalol (NORMODYNE) 300 MG tablet Take 1 tablet (300 mg total) by mouth every 8 (eight) hours. 04/24/23 05/24/23  Jeannie Fend, PA-C  pantoprazole (PROTONIX) 40 MG tablet Take 1 tablet (40 mg total) by mouth daily. 03/19/23   Love, Evlyn Kanner, PA-C  polyethylene glycol (MIRALAX / GLYCOLAX) 17 g packet Take 17 g by mouth daily. Patient not taking: Reported on 03/27/2023 03/18/23   Love, Evlyn Kanner, PA-C  senna (SENOKOT) 8.6 MG TABS tablet Take 1 tablet (8.6 mg total) by mouth 2 (two) times daily. 03/18/23   Delle Reining  S, PA-C  sodium bicarbonate 650 MG tablet Take 1 tablet (650 mg total) by mouth 3 (three) times daily. 03/18/23   Love, Evlyn Kanner, PA-C      Allergies    Ibuprofen    Review of Systems   Review of Systems Negative except as per HPI Physical Exam Updated Vital Signs BP (!) 177/111   Pulse 91   Temp 98.1 F (36.7 C) (Oral)   Resp 18   Ht 5\' 10"  (1.778 m)   Wt 106.6 kg   SpO2 100%   BMI 33.72 kg/m  Physical Exam Vitals and nursing note reviewed.  Constitutional:      General: He is not in acute distress.    Appearance: He is well-developed. He is not  diaphoretic.  HENT:     Head: Normocephalic and atraumatic.     Mouth/Throat:     Mouth: Mucous membranes are moist.  Eyes:     Conjunctiva/sclera: Conjunctivae normal.  Cardiovascular:     Rate and Rhythm: Normal rate and regular rhythm.     Heart sounds: Normal heart sounds.  Pulmonary:     Effort: Pulmonary effort is normal.     Breath sounds: Normal breath sounds.  Abdominal:     Palpations: Abdomen is soft.     Tenderness: There is no abdominal tenderness.  Musculoskeletal:     Cervical back: Neck supple.     Right lower leg: No edema.     Left lower leg: No edema.  Skin:    General: Skin is warm and dry.  Neurological:     Mental Status: He is alert and oriented to person, place, and time.  Psychiatric:        Behavior: Behavior normal.     ED Results / Procedures / Treatments   Labs (all labs ordered are listed, but only abnormal results are displayed) Labs Reviewed  CBC WITH DIFFERENTIAL/PLATELET - Abnormal; Notable for the following components:      Result Value   Hemoglobin 12.3 (*)    HCT 37.1 (*)    Eosinophils Absolute 0.8 (*)    All other components within normal limits  COMPREHENSIVE METABOLIC PANEL - Abnormal; Notable for the following components:   Glucose, Bld 139 (*)    BUN 26 (*)    Creatinine, Ser 2.88 (*)    Calcium 8.7 (*)    Total Protein 6.4 (*)    Albumin 3.1 (*)    GFR, Estimated 28 (*)    All other components within normal limits  TROPONIN I (HIGH SENSITIVITY) - Abnormal; Notable for the following components:   Troponin I (High Sensitivity) 32 (*)    All other components within normal limits  TROPONIN I (HIGH SENSITIVITY) - Abnormal; Notable for the following components:   Troponin I (High Sensitivity) 33 (*)    All other components within normal limits  TROPONIN I (HIGH SENSITIVITY) - Abnormal; Notable for the following components:   Troponin I (High Sensitivity) 34 (*)    All other components within normal limits    EKG EKG  Interpretation Date/Time:  Wednesday April 24 2023 14:29:48 EDT Ventricular Rate:  86 PR Interval:  148 QRS Duration:  84 QT Interval:  378 QTC Calculation: 453 R Axis:   41  Text Interpretation: Sinus rhythm Borderline T wave abnormalities no sig change from previous Confirmed by Arby Barrette [id number]) on 04/24/2023 6:12:52 PM  Radiology No results found.  Procedures Procedures    Medications Ordered in ED Medications  hydrALAZINE (  APRESOLINE) tablet 100 mg (100 mg Oral Given 04/24/23 1608)  labetalol (NORMODYNE) tablet 300 mg (300 mg Oral Given 04/24/23 1741)  ondansetron (ZOFRAN-ODT) disintegrating tablet 4 mg (4 mg Oral Given 04/24/23 1748)    ED Course/ Medical Decision Making/ A&P                                  Medical Decision Making Amount and/or Complexity of Data Reviewed Labs: ordered. Radiology: ordered.  Risk Prescription drug management.   This patient presents to the ED for concern of elevated blood pressure, this involves an extensive number of treatment options, and is a complaint that carries with it a high risk of complications and morbidity.  The differential diagnosis includes hypertensive urgency/emergency, medication noncompliance/error    Co morbidities that complicate the patient evaluation  Type 1 diabetes, hypertension, CKD, CVA, OSA   Additional history obtained:  External records from outside source obtained and reviewed including discharge summary dated 03/08/2023 to include review of patient's medication list at time of discharge.  Walk-in visit to PCP office for medication refills, was not seen that time.   Lab Tests:  I Ordered, and personally interpreted labs.  The pertinent results include: CBC without significant findings.  CMP with creatinine of 2.88, on trend with prior.  Initial troponin is 32, repeat troponin is 33, will trend for third troponin.   Imaging Studies ordered:  I ordered imaging studies including chest  x-ray I independently visualized and interpreted imaging which showed negative for cardiomegaly Radiology interpretation pending at time of signout.   Cardiac Monitoring: / EKG:  The patient was maintained on a cardiac monitor.  I personally viewed and interpreted the cardiac monitored which showed an underlying rhythm of: Sinus rhythm, rate 86   Consultations Obtained:  I requested consultation with the Dr. Clarice Pole,  and discussed lab and imaging findings as well as pertinent plan - they recommend: 3rd troponin, likely related to patient's CKD if not symptomatic.    Problem List / ED Course / Critical interventions / Medication management  37 year old male presents the emergency room after he was found to be quite hypertensive at PT today.  Patient denies any complaints other than feeling like his saliva is thick.  He states that he has tried to be compliant with his medications since discharge from the hospital last month however when he went to his PCP office who is through [facility], his medications were not refilled correctly.  Reviewed patient's [place] [facility] discharge summary and compared with records on file, suspect patient's PCP did not have his current med list as of his discharge summary record.  Patient was provided with his usual medications with improvement of his blood pressure.  He remained asymptomatic throughout his time in the emergency room.  His troponins were slightly elevated at 32 and 33, suspect this is secondary to his CKD however no priors on file.  Plan is to trend for third troponin. I ordered medication including hydralazine, labetalol for hypertension Reevaluation of the patient after these medicines showed that the patient improved.  Blood pressure currently 145/90 at time of signout. I have reviewed the patients home medicines and have made adjustments as needed   Social Determinants of Health:  Has PCP and care team   Test / Admission -  Considered:  Ultimate disposition is pending but expect discharge to follow-up with PCP if no significant changes in patient's third troponin.  Final Clinical Impression(s) / ED Diagnoses Final diagnoses:  Hypertension, unspecified type    Rx / DC Orders ED Discharge Orders          Ordered    amLODipine (NORVASC) 10 MG tablet  Daily        04/24/23 1908    hydrALAZINE (APRESOLINE) 100 MG tablet  Every 8 hours        04/24/23 1908    labetalol (NORMODYNE) 300 MG tablet  Every 8 hours        04/24/23 1908              Alden Hipp 04/24/23 1913    Arby Barrette, MD 04/25/23 1843

## 2023-04-24 NOTE — ED Triage Notes (Signed)
Pt sts BP was elevated at OT today and they suggested he be evaluated (166/105 187/115); taking meds as directed; reports an intermittent HA; recent stroke in Aug

## 2023-04-24 NOTE — Discharge Instructions (Addendum)
Follow-up with your care team as planned. Return to the emergency room for worsening or concerning symptoms.  Take your medications as prescribed.  Monitor your blood pressure.

## 2023-04-29 ENCOUNTER — Other Ambulatory Visit: Payer: Self-pay

## 2023-04-29 ENCOUNTER — Ambulatory Visit: Payer: Medicaid Other

## 2023-04-29 ENCOUNTER — Ambulatory Visit: Payer: Medicaid Other | Admitting: Occupational Therapy

## 2023-04-29 DIAGNOSIS — R2681 Unsteadiness on feet: Secondary | ICD-10-CM

## 2023-04-29 DIAGNOSIS — R278 Other lack of coordination: Secondary | ICD-10-CM | POA: Diagnosis not present

## 2023-04-29 DIAGNOSIS — I639 Cerebral infarction, unspecified: Secondary | ICD-10-CM

## 2023-04-29 DIAGNOSIS — R6889 Other general symptoms and signs: Secondary | ICD-10-CM

## 2023-04-29 DIAGNOSIS — M6281 Muscle weakness (generalized): Secondary | ICD-10-CM

## 2023-04-29 DIAGNOSIS — R2689 Other abnormalities of gait and mobility: Secondary | ICD-10-CM

## 2023-04-29 NOTE — Patient Instructions (Signed)
  Coordination Activities  Perform the following activities for 10 minutes 1 times per day with left hand(s).  Toss ball between hands. Toss ball in air and catch with the same hand. Twirl pen between fingers. Rotate 2 golf balls in your left hand    Resisted Horizontal Abduction: Bilateral   Sit  tubing in both hands, arms out in front. Keeping arms straight, pinch shoulder blades together and stretch arms out. Repeat _10___ times per set. Do _1___ sessions per day, every other day.   Elbow Flexion: Resisted   With tubing held in _left_ hand(s) and other end secured under foot, curl arm up as far as possible. Repeat _10___ times per set. Do _1-2___ sessions per day, every other day.    Elbow Extension: Resisted   Sit in chair with resistive band secured at armrest (or hold with other hand) and __left_____ elbow bent. Straighten elbow. Repeat _10___ times per set.  Do _1__ sessions per day, every other day.   Copyright  VHI. All rights reserved.

## 2023-04-29 NOTE — Therapy (Signed)
OUTPATIENT OCCUPATIONAL THERAPY NEURO treatment Patient Name: Bradley Davis MRN: [id number] DOB:1986/03/21, 37 y.o., male Today's Date: 04/29/2023  PCP: Darnelle Going FNP REFERRING PROVIDER: Dr. Wynn Banker  END OF SESSION:  OT End of Session - 04/29/23 1708     Visit Number 4    Number of Visits 9    Date for OT Re-Evaluation 06/20/23    Authorization Type [employer]    Authorization - Visit Number 3    Authorization - Number of Visits 6               Past Medical History:  Diagnosis Date   CKD stage 3a, GFR 45-59 ml/min (HCC)    Diabetes mellitus    Hypertension    Necrotizing fasciitis (HCC) 04/2021   right groin   Stroke [facility])    Past Surgical History:  Procedure Laterality Date   INCISION AND DRAINAGE ABSCESS Right 04/21/2021   Procedure: INCISION, DRAINAGE, DEBRIDEMENT OF RIGHT THIGH/GROIN INFECTION;  Surgeon: Abigail Miyamoto, MD;  Location: [facility];  Service: General;  Laterality: Right;   IRRIGATION AND DEBRIDEMENT ABSCESS Right 04/23/2021   Procedure: IRRIGATION AND DEBRIDEMENT RIGHT GROIN WOUND; RIGHT THIGH DRESSING CHANGE;  Surgeon: Abigail Miyamoto, MD;  Location: [facility];  Service: General;  Laterality: Right;   IRRIGATION AND DEBRIDEMENT ABSCESS Right 04/26/2021   Procedure: reexploration and debridement of soft tissue infection right groin;  Surgeon: Emelia Loron, MD;  Location: [facility];  Service: General;  Laterality: Right;   RETINAL DETACHMENT SURGERY  2022   TONSILLECTOMY  2003   Patient Active Problem List   Diagnosis Date Noted   Constipation 03/18/2023   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   OSA (obstructive sleep apnea) 03/06/2023   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   Intractable nausea and vomiting 02/28/2023   Acute renal failure superimposed on stage 3a chronic kidney disease (HCC) 02/27/2023   DM (diabetes mellitus), 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   Essential hypertension 04/23/2021   Necrotizing fasciitis (HCC)  04/22/2021    ONSET DATE: 02/27/23  REFERRING DIAG: I69.30 (ICD-10-CM) - Unspecified sequelae of cerebral infarction  THERAPY DIAG:  Other lack of coordination  Muscle weakness (generalized)  Other abnormalities of gait and mobility  Decreased activity tolerance  Rationale for Evaluation and Treatment: Rehabilitation  SUBJECTIVE:   SUBJECTIVE STATEMENT: Pt reports he has been working some. Pt accompanied by: self  PERTINENT HISTORY: Pt presented initially to [facility] 02/27/23 with reports of nausea and vomiting for 2 days. Pt was hypertensive on evaluation with elevated creatinine. Pt was admitted for AKI and CKD stage III. 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 PMH: DM 1, HTN, CKD III.Pt received therapies at [facility] and was d/c 03/18/23  PRECAUTIONS: Fall and Other: orthostatic hypotension at times dyscoordination of RLE d/t central balance impairment   WEIGHT BEARING RESTRICTIONS: No  PAIN:  Are you having pain? No  FALLS: Has patient fallen in last 6 months? No  LIVING ENVIRONMENT: Lives with: lives alone Lives in: House/apartment   PLOF: Independent  PATIENT GOALS: return to work, improve ability to perform work activities  OBJECTIVE:   HAND DOMINANCE: Left  ADLs: Overall ADLs: increased time required Transfers/ambulation related to ADLs: Eating: mod I  Grooming: mod I UB Dressing: mod I LB Dressing: mod I Toileting: mod I at home no assistive device Bathing: shower chair Tub Shower transfers: mod I   IADLs: Shopping: assistance for transportation Light housekeeping: has performed light cleaning of  sink and toilet, no back to baseline due to balance Meal Prep: has performed very simple cooking, fatigued quickly Community mobility: mod I with rollator Medication management: mod I Financial management: mod I Handwriting: 100% legible  MOBILITY STATUS:  mod I with rollator  ACTIVITY  TOLERANCE: Activity tolerance: decreased for work activities, pt does not have the standing tolerance and balance for work activities at this time  FUNCTIONAL OUTCOME MEASURES: Quick Dash: 4.5% disability UPPER EXTREMITY ROM:  WFL for bilateral UE's    UPPER EXTREMITY MMT:   4+/5 overall for bilateral UE's    HAND FUNCTION: Grip strength: Right: 100 lbs; Left: 90 lbs  COORDINATION: 9 Hole Peg test: Right: 20.94 sec; Left: 24.18 sec  SENSATION: Intermittent tingling in left hand , sensation appears intact    COGNITION: Overall cognitive status:  denies short term memory challenges     VISION ASSESSMENT: Denies changes      PRAXIS: Impaired: Motor planning  OBSERVATIONS: Pt was pleasant and agreeable to participate in evaluation. 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   TODAY'S TREATMENT:                                                                                                                              DATE: 04/29/23- BP initally 147/77 Arm bike x 6 mins level 3 for conditioning, BP 160/89 following exercise. Pt was instructed in green theraband exercises 10-15 reps each, min v.c Pt was instructed in coordination HEP, see pt instructions.  04/17/23- Therapist provided education regarding energy conservation and balance seated and standing activities as well as rest/ activity. Pt reports working and sometimes becoming over fatigued. Therapsit recommends seated rest break every hour initally and that he showers in seated to conserve energy. Pt arrived wearing abdominal binder and BP was elevated, see vitals. Binder was removed and BP improved with activity. Standing for functional reaching from low to high surface, in standing with supervision no LOB. Pt had to stop task due to nausea following this task BP 136/92 Following rest break quadraped on mat rocking forwards and backwards, then bird dog with supervision. BP end of session was  161/78   04/04/23- Pt was instructed in HEP for core stability and UE strength, min v.c and demonstration, 10 reps each. Standing in corner, holding 2.2 lbs ball pt performed diagonals each direction x 10 reps each, with close supervision for core stability and strength Arm bike x 6 mins level 3 for conditioning. Putty HEP  for sustained grip and pinch issued min v.c    03/27/23- eval only   PATIENT EDUCATION: Education details:  coordination HEP, theraband exercises, green Person educated: Patient Education method: Explanation, demonstration Education comprehension: verbalized understanding,returned demonstration  HOME EXERCISE PROGRAM: N/a   GOALS: Goals reviewed with patient? Yes  SHORT TERM GOALS: Target date: 04/26/23  I with initial HEP. Baseline:dependent Goal status:  ongoing 04/29/23  2.  Pt will  increase LUE grip strength by 10 lbs for increased functional use. Baseline: Grip strength: Right: 100 lbs; Left: 90 lbs Goal status: ongoing LUE 92 lbs 10/21  3.  Pt will demonstrate improved fine motor coordination  as evidenced by decreasing LUE 9 hole peg test score to 21 secs or less Baseline: 9 Hole Peg test: Right: 20.94 sec; Left: 24.18 sec Goal status: ongoing , LUE 27.33, 25.60 10/21    LONG TERM GOALS: Target date: 06/20/23  I with updated HEP Baseline:dependent Goal status: INITIAL  2.  Pt will perform simulated work activities modified independently without LOB. Baseline: dependent, has not returned to work Goal status: INITIAL  3.  Pt will perform mod complex home management and  activities modified independently. Baseline: Pt has only performed very light cooking and home management, no back to baseline. Goal status: INITIAL    ASSESSMENT:  CLINICAL IMPRESSION: Pt is progressing towards goals. He missed last weeks OT appointment due to elevated BP. Pt has seen MD and they adjusted his meds. Pt's BP is better today. PERFORMANCE DEFICITS: in  functional skills including ADLs, IADLs, coordination, dexterity, strength, flexibility, Fine motor control, Gross motor control, mobility, balance, endurance, decreased knowledge of precautions, decreased knowledge of use of DME, and UE functional use, , and psychosocial skills including coping strategies, environmental adaptation, habits, interpersonal interactions, and routines and behaviors.   IMPAIRMENTS: are limiting patient from ADLs, IADLs, work, play, leisure, and social participation.   CO-MORBIDITIES: may have co-morbidities  that affects occupational performance. Patient will benefit from skilled OT to address above impairments and improve overall function.  MODIFICATION OR ASSISTANCE TO COMPLETE EVALUATION: No modification of tasks or assist necessary to complete an evaluation.  OT OCCUPATIONAL PROFILE AND HISTORY: Detailed assessment: Review of records and additional review of physical, cognitive, psychosocial history related to current functional performance.  CLINICAL DECISION MAKING: LOW - limited treatment options, no task modification necessary  REHAB POTENTIAL: Good  EVALUATION COMPLEXITY: Low    PLAN:  OT FREQUENCY: 1x/week  OT DURATION:  8 sessions over 12 weeks  PLANNED INTERVENTIONS: self care/ADL training, therapeutic exercise, therapeutic activity, neuromuscular re-education, manual therapy, gait training, balance training, functional mobility training, ultrasound, moist heat, cryotherapy, patient/family education, energy conservation, coping strategies training, DME and/or AE instructions, and Re-evaluation  RECOMMENDED OTHER SERVICES: PT  CONSULTED AND AGREED WITH PLAN OF CARE: Patient  PLAN FOR NEXT SESSION: review HEP, dynamic balance activities, core stability   Ernesha Ramone, OT 04/29/2023, 5:08 PM

## 2023-04-29 NOTE — Therapy (Signed)
OUTPATIENT PHYSICAL THERAPY NEURO EVALUATION   Patient Name: Bradley Davis MRN: [id number] DOB:1985-12-04, 37 y.o., male Today's Date: 04/29/2023   PCP: Darnelle Going REFERRING PROVIDER: Delle Reining, PA  END OF SESSION:  PT End of Session - 04/29/23 1450     Visit Number 2    Date for PT Re-Evaluation 07/17/23    Progress Note Due on Visit 10    PT Start Time 1450    PT Stop Time 1535    PT Time Calculation (min) 45 min    Activity Tolerance Patient tolerated treatment well    Behavior During Therapy [facility] for tasks assessed/performed             Past Medical History:  Diagnosis Date   CKD stage 3a, GFR 45-59 ml/min (HCC)    Diabetes mellitus    Hypertension    Necrotizing fasciitis (HCC) 04/2021   right groin   Stroke [facility])    Past Surgical History:  Procedure Laterality Date   INCISION AND DRAINAGE ABSCESS Right 04/21/2021   Procedure: INCISION, DRAINAGE, DEBRIDEMENT OF RIGHT THIGH/GROIN INFECTION;  Surgeon: Abigail Miyamoto, MD;  Location: [facility];  Service: General;  Laterality: Right;   IRRIGATION AND DEBRIDEMENT ABSCESS Right 04/23/2021   Procedure: IRRIGATION AND DEBRIDEMENT RIGHT GROIN WOUND; RIGHT THIGH DRESSING CHANGE;  Surgeon: Abigail Miyamoto, MD;  Location: [facility];  Service: General;  Laterality: Right;   IRRIGATION AND DEBRIDEMENT ABSCESS Right 04/26/2021   Procedure: reexploration and debridement of soft tissue infection right groin;  Surgeon: Emelia Loron, MD;  Location: [facility];  Service: General;  Laterality: Right;   RETINAL DETACHMENT SURGERY  2022   TONSILLECTOMY  2003   Patient Active Problem List   Diagnosis Date Noted   Constipation 03/18/2023   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   OSA (obstructive sleep apnea) 03/06/2023   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   Intractable nausea and vomiting 02/28/2023   Acute renal failure superimposed on stage 3a chronic kidney disease (HCC) 02/27/2023   DM (diabetes mellitus), type 1 with  renal complications (HCC) 04/23/2021   Essential hypertension 04/23/2021   Necrotizing fasciitis (HCC) 04/22/2021    ONSET DATE: 02/09/23   REFERRING DIAG: cerebellar stroke  THERAPY DIAG:  Other abnormalities of gait and mobility  Unsteadiness on feet  Cerebellar stroke (HCC)  Other lack of coordination  Decreased activity tolerance  Rationale for Evaluation and Treatment: Rehabilitation  SUBJECTIVE:  SUBJECTIVE STATEMENT: I feel good, I was in the ER after last session due to my blood pressure but now they have normalized my medicine and my blood pressure is doing fine. Pt accompanied by: self  PERTINENT HISTORY: cerebellar stroke also renal disease, hospitalized 8/3/to 9/9/224 for rehab.  Referred to outpt PT  PAIN:  Are you having pain? No  PRECAUTIONS: Other: BP  RED FLAGS: None   WEIGHT BEARING RESTRICTIONS: No  FALLS: Has patient fallen in last 6 months? Yes. Number of falls 4  LIVING ENVIRONMENT: Lives with: lives with their family Lives in: House/apartment Has following equipment at home: Single point cane and Environmental consultant - 4 wheeled  PLOF: Independent  PATIENT GOALS: return to I driving, walking, managing my food truck  OBJECTIVE:  Note: Objective measures were completed at Evaluation unless otherwise noted.  DIAGNOSTIC FINDINGS:copied from hospitalist notes: MRI brain done for workup showing small acute infarct in lateral right medulla and deep right cerebral white matter adjacent to lateral ventricle.   COGNITION: Overall cognitive status: Within functional limits for tasks assessed   SENSATION: WFL  COORDINATION: Alt toe taps, symmetrical toe taps in sitting wnl  EDEMA:  None noted  MUSCLE TONE: all wnl    LOWER EXTREMITY ROM:   all LE ROM wnl  LOWER  EXTREMITY MMT:  burst testing B LE 's wnl  BED MOBILITY: NA today   TRANSFERS: Assistive device utilized: Environmental consultant - 4 wheeled  Sit to stand: Modified independence Stand to sit: Modified independence   GAIT: Gait pattern: lateral lean- Right and wide BOS Distance walked: 80 Assistive device utilized: Walker - 4 wheeled Level of assistance: Modified independence   Monitored BO in lobby,BP 166/106 After walking 40' lobby to clinic: BP 187/115 After walking 80' BP 196/117 Stopped eval at this pt and called PCP to report he was advised to go to urgent care FUNCTIONAL TESTS:  TBD  PATIENT SURVEYS:    TODAY'S TREATMENT:                                                                                                                              DATE: 04/29/23:  Completed FGA: 16/30 Mini jumps B LE's 30 sec in ll bars Jogging in ll bars 30 sec  Large step with reaches across trunk to target on wall behind  Dribbling 65 cm green physioball x 40' L hand, 40' R hand Wall ladder climbs 15 x each side, added horizontal head turns Corner for semitandem standing for horizontal head turns. Leg press 70# B LE's  Leg press 70# B ankle plantarflexors   04/24/23     PATIENT EDUCATION: Education details: POC, goals Person educated: Patient Education method: Explanation, Demonstration, Tactile cues, and Verbal cues Education comprehension: verbalized understanding  HOME EXERCISE PROGRAM: TBD  GOALS: Goals reviewed with patient? Yes  SHORT TERM GOALS: Target date: 2 weeks 05/08/23  IHEP  Baseline:TBD Goal status: INITIAL   LONG TERM GOALS: Target  date: 07/17/23  Able to complete FGA and score 30/30 Baseline: TBA 04/29/23: FGA 16/30 Goal status: INITIAL  2.  Able to walk greater than 1000' maintaining pace of 1 m/sec for consistent and efficient community ambulation Baseline: TBD Goal status: INITIAL  3.  Sit to stand 30 sec, 15 sec or more to demonstrate endurance,  coordination, functional strength Baseline: TBD Goal status: INITIAL  ASSESSMENT:  CLINICAL IMPRESSION: Patient is a 37 y.o. male who was participated today in skilled physical therapy treatment due to cerebellar stroke. Completed more of his assessment today, noted the most difficulty with balance with narrowed base of support and with large/ fast head and trunk turns.  Therefore tried to focus on these activities during today's session.  Discussed his endurance again today and need to pace his activities, to allow his brain to rest.   OBJECTIVE IMPAIRMENTS: Abnormal gait, cardiopulmonary status limiting activity, decreased balance, and decreased coordination.   ACTIVITY LIMITATIONS: carrying, lifting, standing, squatting, stairs, locomotion level, and caring for others  PARTICIPATION LIMITATIONS: meal prep, cleaning, laundry, driving, occupation, and yard work  PERSONAL FACTORS: Behavior pattern, Past/current experiences, and Time since onset of injury/illness/exacerbation are also affecting patient's functional outcome.   REHAB POTENTIAL: Good  CLINICAL DECISION MAKING: Evolving/moderate complexity  EVALUATION COMPLEXITY: Moderate  PLAN:  PT FREQUENCY: 2x/week  PT DURATION: 12 weeks  PLANNED INTERVENTIONS: 97110-Therapeutic exercises, 97530- Therapeutic activity, 97112- Neuromuscular re-education, 97535- Self Care, and [id number]- Manual therapy  PLAN FOR NEXT SESSION: address balance/coordination activities with narrowed stance, horizontal head turns, trunk movements.   Jaymen Fetch L Kaly Mcquary, PT, DPT, OCS 04/29/2023, 3:51 PM

## 2023-05-01 ENCOUNTER — Ambulatory Visit: Payer: Medicaid Other

## 2023-05-01 ENCOUNTER — Ambulatory Visit: Payer: Medicaid Other | Admitting: Occupational Therapy

## 2023-05-01 NOTE — Therapy (Deleted)
OUTPATIENT OCCUPATIONAL THERAPY NEURO treatment Patient Name: Bradley Davis MRN: [id number] DOB:08/18/1985, 37 y.o., male Today's Date: 05/01/2023  PCP: Darnelle Going FNP REFERRING PROVIDER: Dr. Wynn Banker  END OF SESSION:      Past Medical History:  Diagnosis Date   CKD stage 3a, GFR 45-59 ml/min (HCC)    Diabetes mellitus    Hypertension    Necrotizing fasciitis (HCC) 04/2021   right groin   Stroke [facility])    Past Surgical History:  Procedure Laterality Date   INCISION AND DRAINAGE ABSCESS Right 04/21/2021   Procedure: INCISION, DRAINAGE, DEBRIDEMENT OF RIGHT THIGH/GROIN INFECTION;  Surgeon: Abigail Miyamoto, MD;  Location: [facility];  Service: General;  Laterality: Right;   IRRIGATION AND DEBRIDEMENT ABSCESS Right 04/23/2021   Procedure: IRRIGATION AND DEBRIDEMENT RIGHT GROIN WOUND; RIGHT THIGH DRESSING CHANGE;  Surgeon: Abigail Miyamoto, MD;  Location: [facility];  Service: General;  Laterality: Right;   IRRIGATION AND DEBRIDEMENT ABSCESS Right 04/26/2021   Procedure: reexploration and debridement of soft tissue infection right groin;  Surgeon: Emelia Loron, MD;  Location: [facility];  Service: General;  Laterality: Right;   RETINAL DETACHMENT SURGERY  2022   TONSILLECTOMY  2003   Patient Active Problem List   Diagnosis Date Noted   Constipation 03/18/2023   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  Rationale for Evaluation and Treatment: Rehabilitation  SUBJECTIVE:    SUBJECTIVE STATEMENT: Pt reports he has been working some. Pt accompanied by: self  PERTINENT HISTORY: Pt presented initially to [facility] 02/27/23 with reports of nausea and vomiting for 2 days. Pt was hypertensive on evaluation with elevated creatinine. Pt was admitted for AKI and CKD stage III. 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 PMH: DM 1, HTN, CKD III.Pt received therapies at CIR and was d/c 03/18/23  PRECAUTIONS: Fall and Other: orthostatic hypotension at times dyscoordination of RLE d/t central balance impairment   WEIGHT BEARING RESTRICTIONS: No  PAIN:  Are you having pain? No  FALLS: Has patient fallen in last 6 months? No  LIVING ENVIRONMENT: Lives with: lives alone Lives in: House/apartment   PLOF: Independent  PATIENT GOALS: return to work, improve ability to perform work activities  OBJECTIVE:   HAND DOMINANCE: Left  ADLs: Overall ADLs: increased time required Transfers/ambulation related to ADLs: Eating: mod I  Grooming: mod I UB Dressing: mod I LB Dressing: mod I Toileting: mod I at home no assistive device Bathing: shower chair Tub Shower transfers: mod I   IADLs: Shopping: assistance for transportation Light housekeeping: has performed light cleaning of sink and toilet, no back to baseline due to balance Meal Prep: has performed very simple cooking, fatigued quickly Community mobility: mod I with rollator Medication management: mod I Financial management: mod I Handwriting: 100% legible  MOBILITY STATUS:  mod I with rollator  ACTIVITY TOLERANCE: Activity tolerance: decreased for work activities, pt does not have the standing tolerance and balance for work activities at this time  FUNCTIONAL OUTCOME MEASURES: Quick Dash: 4.5% disability UPPER EXTREMITY  ROM:  WFL for bilateral UE's    UPPER EXTREMITY MMT:   4+/5 overall for bilateral UE's    HAND FUNCTION: Grip strength: Right: 100  lbs; Left: 90 lbs  COORDINATION: 9 Hole Peg test: Right: 20.94 sec; Left: 24.18 sec  SENSATION: Intermittent tingling in left hand , sensation appears intact    COGNITION: Overall cognitive status:  denies short term memory challenges     VISION ASSESSMENT: Denies changes      PRAXIS: Impaired: Motor planning  OBSERVATIONS: Pt was pleasant and agreeable to participate in evaluation. 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   TODAY'S TREATMENT:                                                                                                                              DATE: 04/29/23- BP initally 147/77 Arm bike x 6 mins level 3 for conditioning, BP 160/89 following exercise. Pt was instructed in green theraband exercises 10-15 reps each, min v.c Pt was instructed in coordination HEP, see pt instructions.  04/17/23- Therapist provided education regarding energy conservation and balance seated and standing activities as well as rest/ activity. Pt reports working and sometimes becoming over fatigued. Therapsit recommends seated rest break every hour initally and that he showers in seated to conserve energy. Pt arrived wearing abdominal binder and BP was elevated, see vitals. Binder was removed and BP improved with activity. Standing for functional reaching from low to high surface, in standing with supervision no LOB. Pt had to stop task due to nausea following this task BP 136/92 Following rest break quadraped on mat rocking forwards and backwards, then bird dog with supervision. BP end of session was 161/78   04/04/23- Pt was instructed in HEP for core stability and UE strength, min v.c and demonstration, 10 reps each. Standing in corner, holding 2.2 lbs ball pt performed diagonals each direction x 10 reps each, with close supervision for core stability and strength Arm bike x 6 mins level 3 for conditioning. Putty HEP  for sustained grip and pinch issued min v.c     03/27/23- eval only   PATIENT EDUCATION: Education details:  coordination HEP, theraband exercises, green Person educated: Patient Education method: Explanation, demonstration Education comprehension: verbalized understanding,returned demonstration  HOME EXERCISE PROGRAM: N/a   GOALS: Goals reviewed with patient? Yes  SHORT TERM GOALS: Target date: 04/26/23  I with initial HEP. Baseline:dependent Goal status:  ongoing 04/29/23  2.  Pt will increase LUE grip strength by 10 lbs for increased functional use. Baseline: Grip strength: Right: 100 lbs; Left: 90 lbs Goal status: ongoing LUE 92 lbs 10/21  3.  Pt will demonstrate improved fine motor coordination  as evidenced by decreasing LUE 9 hole peg test score to 21 secs or less Baseline: 9 Hole Peg test: Right: 20.94 sec; Left: 24.18 sec Goal status: ongoing , LUE 27.33, 25.60 10/21    LONG TERM GOALS:  Target date: 06/20/23  I with updated HEP Baseline:dependent Goal status: INITIAL  2.  Pt will perform simulated work activities modified independently without LOB. Baseline: dependent, has not returned to work Goal status: INITIAL  3.  Pt will perform mod complex home management and  activities modified independently. Baseline: Pt has only performed very light cooking and home management, no back to baseline. Goal status: INITIAL    ASSESSMENT:  CLINICAL IMPRESSION: Pt is progressing towards goals. He missed last weeks OT appointment due to elevated BP. Pt has seen MD and they adjusted his meds. Pt's BP is better today. PERFORMANCE DEFICITS: in functional skills including ADLs, IADLs, coordination, dexterity, strength, flexibility, Fine motor control, Gross motor control, mobility, balance, endurance, decreased knowledge of precautions, decreased knowledge of use of DME, and UE functional use, , and psychosocial skills including coping strategies, environmental adaptation, habits, interpersonal interactions, and  routines and behaviors.   IMPAIRMENTS: are limiting patient from ADLs, IADLs, work, play, leisure, and social participation.   CO-MORBIDITIES: may have co-morbidities  that affects occupational performance. Patient will benefit from skilled OT to address above impairments and improve overall function.  MODIFICATION OR ASSISTANCE TO COMPLETE EVALUATION: No modification of tasks or assist necessary to complete an evaluation.  OT OCCUPATIONAL PROFILE AND HISTORY: Detailed assessment: Review of records and additional review of physical, cognitive, psychosocial history related to current functional performance.  CLINICAL DECISION MAKING: LOW - limited treatment options, no task modification necessary  REHAB POTENTIAL: Good  EVALUATION COMPLEXITY: Low    PLAN:  OT FREQUENCY: 1x/week  OT DURATION:  8 sessions over 12 weeks  PLANNED INTERVENTIONS: self care/ADL training, therapeutic exercise, therapeutic activity, neuromuscular re-education, manual therapy, gait training, balance training, functional mobility training, ultrasound, moist heat, cryotherapy, patient/family education, energy conservation, coping strategies training, DME and/or AE instructions, and Re-evaluation  RECOMMENDED OTHER SERVICES: PT  CONSULTED AND AGREED WITH PLAN OF CARE: Patient  PLAN FOR NEXT SESSION: review HEP, dynamic balance activities, core stability   Arthor Gorter, OT 05/01/2023, 8:15 AM

## 2023-05-02 ENCOUNTER — Encounter: Payer: Medicaid Other | Attending: Registered Nurse | Admitting: Physical Medicine & Rehabilitation

## 2023-05-02 ENCOUNTER — Encounter: Payer: Self-pay | Admitting: Physical Medicine & Rehabilitation

## 2023-05-02 VITALS — BP 142/83 | HR 89 | Ht 70.0 in | Wt 261.0 lb

## 2023-05-02 DIAGNOSIS — R269 Unspecified abnormalities of gait and mobility: Secondary | ICD-10-CM | POA: Insufficient documentation

## 2023-05-02 DIAGNOSIS — I69398 Other sequelae of cerebral infarction: Secondary | ICD-10-CM | POA: Diagnosis present

## 2023-05-02 NOTE — Patient Instructions (Signed)

## 2023-05-02 NOTE — Progress Notes (Signed)
Subjective:    Patient ID: Bradley Davis, male    DOB: 1985/08/15, 37 y.o.   MRN: [id number]  37 y.o. LH-male with history of T1DM, CKD 3A, HTN who was admitted on 02/27/2023 with 2-day history of nausea vomiting progressing to dizziness with standing at times.  He was found to have acute on chronic renal failure with serum creatinine up to 3.6 and was treated with fluid boluses.  He was also started on bowel regimen due to his evidence of constipation.  Therapy was working with patient who continued to have issues with nausea as well as decreased sensation RLE staggering gait as well as orthostatic changes.     MRI brain done for workup showing small acute infarct in lateral right medulla and deep right cerebral white matter adjacent to lateral ventricle.  2D echo showed EF 55 to 60% and was negative for shunt.  Renal ultrasound showed normal kidneys.  Dr. Pearlean Brownie felt that stroke was due to small vessel disease and recommended DAPT x 3 weeks followed by aspirin alone.  Due to ongoing issues with deficits in mobility and ADLs, CIR was recommended for follow-up therapy  Admit date: 03/08/2023 Discharge date: 03/18/2023 HPI 37 year old male who suffered a small right medullary CVA approximately 2 months ago who is here for physical medicine rehabilitation follow-up visit.  He is currently receiving outpatient PT OT.  He feels like he is doing quite well and has questions about driving as well as going back to work. Mod I dressing and bathing  Does not use walker in the house  No falls  BP issue, evaluated on 04/24/2023 in ED, PCP reportedly prescribed PTA BP meds rather than posthospital BP meds  Pain Inventory Average Pain 2 Pain Right Now 0 My pain is intermittent  In the last 24 hours, has pain interfered with the following? General activity 0 Relation with others 0 Enjoyment of life 1 What TIME of day is your pain at its worst? varies Sleep (in general) Fair  Pain is worse with:  . Pain  improves with:  . Relief from Meds: 7  Family History  Problem Relation Age of Onset   Cancer Mother    Congestive Heart Failure Father    Cancer Sister    Social History   Socioeconomic History   Marital status: Single    Spouse name: Not on file   Number of children: Not on file   Years of education: Not on file   Highest education level: Not on file  Occupational History   Not on file  Tobacco Use   Smoking status: Never   Smokeless tobacco: Never  Vaping Use   Vaping status: Never Used  Substance and Sexual Activity   Alcohol use: Not Currently    Comment: socially   Drug use: No   Sexual activity: Not on file  Other Topics Concern   Not on file  Social History Narrative   Not on file   Social Determinants of Health   Financial Resource Strain: Not on file  Food Insecurity: No Food Insecurity (02/27/2023)   Hunger Vital Sign    Worried About Running Out of Food in the Last Year: Never true    Ran Out of Food in the Last Year: Never true  Transportation Needs: No Transportation Needs (02/27/2023)   PRAPARE - Administrator, Civil Service (Medical): No    Lack of Transportation (Non-Medical): No  Physical Activity: Not on file  Stress: Not  on file  Social Connections: Unknown (11/17/2021)   [facility]   Social Network    Social Network: Not on file   Past Surgical History:  Procedure Laterality Date   INCISION AND DRAINAGE ABSCESS Right 04/21/2021   Procedure: INCISION, DRAINAGE, DEBRIDEMENT OF RIGHT THIGH/GROIN INFECTION;  Surgeon: Abigail Miyamoto, MD;  Location: [facility];  Service: General;  Laterality: Right;   IRRIGATION AND DEBRIDEMENT ABSCESS Right 04/23/2021   Procedure: IRRIGATION AND DEBRIDEMENT RIGHT GROIN WOUND; RIGHT THIGH DRESSING CHANGE;  Surgeon: Abigail Miyamoto, MD;  Location: [facility];  Service: General;  Laterality: Right;   IRRIGATION AND DEBRIDEMENT ABSCESS Right 04/26/2021   Procedure:  reexploration and debridement of soft tissue infection right groin;  Surgeon: Emelia Loron, MD;  Location: [facility];  Service: General;  Laterality: Right;   RETINAL DETACHMENT SURGERY  2022   TONSILLECTOMY  2003   Past Surgical History:  Procedure Laterality Date   INCISION AND DRAINAGE ABSCESS Right 04/21/2021   Procedure: INCISION, DRAINAGE, DEBRIDEMENT OF RIGHT THIGH/GROIN INFECTION;  Surgeon: Abigail Miyamoto, MD;  Location: [facility];  Service: General;  Laterality: Right;   IRRIGATION AND DEBRIDEMENT ABSCESS Right 04/23/2021   Procedure: IRRIGATION AND DEBRIDEMENT RIGHT GROIN WOUND; RIGHT THIGH DRESSING CHANGE;  Surgeon: Abigail Miyamoto, MD;  Location: [facility];  Service: General;  Laterality: Right;   IRRIGATION AND DEBRIDEMENT ABSCESS Right 04/26/2021   Procedure: reexploration and debridement of soft tissue infection right groin;  Surgeon: Emelia Loron, MD;  Location: [facility];  Service: General;  Laterality: Right;   RETINAL DETACHMENT SURGERY  2022   TONSILLECTOMY  2003   Past Medical History:  Diagnosis Date   CKD stage 3a, GFR 45-59 ml/min (HCC)    Diabetes mellitus    Hypertension    Necrotizing fasciitis (HCC) 04/2021   right groin   Stroke (HCC)    BP (!) 153/93   Pulse 89   Ht 5\' 10"  (1.778 m)   Wt 261 lb (118.4 kg)   SpO2 95%   BMI 37.45 kg/m   Opioid Risk Score:   Fall Risk Score:  `1  Depression screen [facility] 2/9     06/06/2021    2:09 PM  Depression screen PHQ 2/9  Decreased Interest 0  Down, Depressed, Hopeless 0  PHQ - 2 Score 0  Altered sleeping 1  Tired, decreased energy 1  Change in appetite 0  Feeling bad or failure about yourself  0  Trouble concentrating 0  Moving slowly or fidgety/restless 0  Suicidal thoughts 0  PHQ-9 Score 2     Review of Systems  Musculoskeletal:  Positive for gait problem.       Left arm tingling  All other systems reviewed and are negative.     Objective:   Physical Exam MSK upper extremity lower  extremity range of motion without pain or limitations. Neuro:  Eyes without evidence of nystagmus  Tone is normal without evidence of spasticity Cerebellar exam shows no evidence of ataxia on finger nose finger or heel to shin testing No evidence of dysdiadochokinesis with rapid alternating supination pronation of the upper extremities. No evidence of trunkal ataxia  Motor strength is 5/5 in bilateral deltoid, biceps, triceps, finger flexors and extensors, wrist flexors and extensors, hip flexors, knee flexors and extensors, ankle dorsiflexors, plantar flexors, invertors and evertors, toe flexors and extensors  Sensory exam is normal to pinprick, light touch in the upper and lower limbs   Cranial nerves II- Visual fields are  intact to confrontation testing, no blurring of vision III- no evidence of ptosis, upward, downward and medial gaze intact IV- no vertical diplopia or head tilt V- no facial numbness or masseter weakness, the patient does note some tingling in V2 and V3 VI- no pupil abduction weakness VII- no facial droop, good lid closure VII- normal auditory acuity IX- no pharygeal weakness, gag nl X- no pharyngeal weakness, no hoarseness XI- no trap or SCM weakness XII- no glossal weakness  Unable to perform tandem gait Increased base of support no evidence of toe drag or knee instability.      Assessment & Plan:   1.  Medullary infarct has some residual gait abnormalities with increased base of support Continue outpatient PT OT.  Have suggested work simulation.  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  I would be concerned endurance would be an issue and also caring heavy objects from the refrigerator or hot objects. I think he is ready to return to driving Graduated return to driving instructions were provided. It is recommended that the patient first drives with another licensed driver in an empty parking lot. If the patient does well with this, and they can drive  on a quiet street with the licensed driver. If the patient does well with this they can drive on a busy street with a licensed driver. If the patient does well with this, the next time out they can go by himself. For the first month after resuming driving, I recommend no nighttime or Interstate driving.

## 2023-05-08 ENCOUNTER — Ambulatory Visit: Payer: Medicaid Other | Admitting: Physical Therapy

## 2023-05-08 ENCOUNTER — Ambulatory Visit: Payer: Medicaid Other | Admitting: Occupational Therapy

## 2023-05-08 ENCOUNTER — Encounter: Payer: Self-pay | Admitting: Physical Therapy

## 2023-05-08 VITALS — BP 146/86

## 2023-05-08 DIAGNOSIS — R2689 Other abnormalities of gait and mobility: Secondary | ICD-10-CM

## 2023-05-08 DIAGNOSIS — M6281 Muscle weakness (generalized): Secondary | ICD-10-CM

## 2023-05-08 DIAGNOSIS — R6889 Other general symptoms and signs: Secondary | ICD-10-CM

## 2023-05-08 DIAGNOSIS — R2681 Unsteadiness on feet: Secondary | ICD-10-CM

## 2023-05-08 DIAGNOSIS — R278 Other lack of coordination: Secondary | ICD-10-CM

## 2023-05-08 DIAGNOSIS — I639 Cerebral infarction, unspecified: Secondary | ICD-10-CM

## 2023-05-08 NOTE — Therapy (Signed)
OUTPATIENT PHYSICAL THERAPY NEURO EVALUATION   Patient Name: Bradley Davis MRN: [id number] DOB:04/26/1986, 37 y.o., male Today's Date: 05/08/2023   PCP: Darnelle Going REFERRING PROVIDER: Delle Reining, PA  END OF SESSION:  PT End of Session - 05/08/23 1444     Visit Number 3    Date for PT Re-Evaluation 07/17/23    PT Start Time 1145    PT Stop Time 1225    PT Time Calculation (min) 40 min    Activity Tolerance Patient tolerated treatment well    Behavior During Therapy [facility] for tasks assessed/performed              Past Medical History:  Diagnosis Date   CKD stage 3a, GFR 45-59 ml/min (HCC)    Diabetes mellitus    Hypertension    Necrotizing fasciitis (HCC) 04/2021   right groin   Stroke [facility])    Past Surgical History:  Procedure Laterality Date   INCISION AND DRAINAGE ABSCESS Right 04/21/2021   Procedure: INCISION, DRAINAGE, DEBRIDEMENT OF RIGHT THIGH/GROIN INFECTION;  Surgeon: Abigail Miyamoto, MD;  Location: [facility];  Service: General;  Laterality: Right;   IRRIGATION AND DEBRIDEMENT ABSCESS Right 04/23/2021   Procedure: IRRIGATION AND DEBRIDEMENT RIGHT GROIN WOUND; RIGHT THIGH DRESSING CHANGE;  Surgeon: Abigail Miyamoto, MD;  Location: [facility];  Service: General;  Laterality: Right;   IRRIGATION AND DEBRIDEMENT ABSCESS Right 04/26/2021   Procedure: reexploration and debridement of soft tissue infection right groin;  Surgeon: Emelia Loron, MD;  Location: [facility];  Service: General;  Laterality: Right;   RETINAL DETACHMENT SURGERY  2022   TONSILLECTOMY  2003   Patient Active Problem List   Diagnosis Date Noted   Constipation 03/18/2023   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   OSA (obstructive sleep apnea) 03/06/2023   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   Intractable nausea and vomiting 02/28/2023   Acute renal failure superimposed on stage 3a chronic kidney disease (HCC) 02/27/2023   DM (diabetes mellitus), 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    Essential hypertension 04/23/2021   Necrotizing fasciitis (HCC) 04/22/2021    ONSET DATE: 02/09/23   REFERRING DIAG: cerebellar stroke  THERAPY DIAG:  Other abnormalities of gait and mobility  Other lack of coordination  Unsteadiness on feet  Cerebellar stroke (HCC)  Decreased activity tolerance  Muscle weakness (generalized)  Rationale for Evaluation and Treatment: Rehabilitation  SUBJECTIVE:                                                                                                                                                                                             SUBJECTIVE STATEMENT: Patient  reports poor sleep most nights. He has started back to work some, tryin  Pt accompanied by: self  PERTINENT HISTORY: cerebellar stroke also renal disease, hospitalized 8/3/to 9/9/224 for rehab.  Referred to outpt PT  PAIN:  Are you having pain? No  PRECAUTIONS: Other: BP  RED FLAGS: None   WEIGHT BEARING RESTRICTIONS: No  FALLS: Has patient fallen in last 6 months? Yes. Number of falls 4  LIVING ENVIRONMENT: Lives with: lives with their family Lives in: House/apartment Has following equipment at home: Single point cane and Environmental consultant - 4 wheeled  PLOF: Independent  PATIENT GOALS: return to I driving, walking, managing my food truck  OBJECTIVE:  Note: Objective measures were completed at Evaluation unless otherwise noted.  DIAGNOSTIC FINDINGS:copied from hospitalist notes: MRI brain done for workup showing small acute infarct in lateral right medulla and deep right cerebral white matter adjacent to lateral ventricle.   COGNITION: Overall cognitive status: Within functional limits for tasks assessed   SENSATION: WFL  COORDINATION: Alt toe taps, symmetrical toe taps in sitting wnl  EDEMA:  None noted  MUSCLE TONE: all wnl    LOWER EXTREMITY ROM:   all LE ROM wnl  LOWER EXTREMITY MMT:  burst testing B LE 's wnl  BED MOBILITY: NA  today   TRANSFERS: Assistive device utilized: Environmental consultant - 4 wheeled  Sit to stand: Modified independence Stand to sit: Modified independence   GAIT: Gait pattern: lateral lean- Right and wide BOS Distance walked: 80 Assistive device utilized: Walker - 4 wheeled Level of assistance: Modified independence   Monitored BO in lobby,BP 166/106 After walking 40' lobby to clinic: BP 187/115 After walking 80' BP 196/117 Stopped eval at this pt and called PCP to report he was advised to go to urgent care FUNCTIONAL TESTS:  TBD  PATIENT SURVEYS:    TODAY'S TREATMENT:                                                                                                                              DATE:  05/08/23 Bike L3.5 x 4 minutes, 30 sec fast pedalling, RHR/SATS 93/97, EHR/SATS 98/97 Squats with 6# ball x 10 Side lunge with 6# ball x 10 each way to challenge both strength and balance Patient developed R piriformis spasm Lower and upper body stretching to relieve spasm and improve his sleep-lower traps stretch, figure 4 in sit, glut stretch in sit, HS stretch, deep STM with tennis ball Educated to deep breathing techniques to improve his breathing and promote improved relaxation before sleep.  04/29/23:  Completed FGA: 16/30 Mini jumps B LE's 30 sec in ll bars Jogging in ll bars 30 sec  Large step with reaches across trunk to target on wall behind  Dribbling 65 cm green physioball x 40' L hand, 40' R hand Wall ladder climbs 15 x each side, added horizontal head turns Corner for semitandem standing for horizontal head turns. Leg press 70# B LE's  Leg  press 70# B ankle plantarflexors  04/24/23   PATIENT EDUCATION: Education details: POC, goals Person educated: Patient Education method: Explanation, Demonstration, Tactile cues, and Verbal cues Education comprehension: verbalized understanding  HOME EXERCISE PROGRAM:  [id number]  GOALS: Goals reviewed with patient? Yes  SHORT  TERM GOALS: Target date: 2 weeks 05/08/23  IHEP  Baseline:TBD Goal status: 05/08/23 Provided, ongoing   LONG TERM GOALS: Target date: 07/17/23  Able to complete FGA and score 30/30 Baseline: TBA 04/29/23: FGA 16/30 Goal status: INITIAL  2.  Able to walk greater than 1000' maintaining pace of 1 m/sec for consistent and efficient community ambulation Baseline: TBD Goal status: INITIAL  3.  Sit to stand 30 sec, 15 sec or more to demonstrate endurance, coordination, functional strength Baseline: TBD Goal status: INITIAL  ASSESSMENT:  CLINICAL IMPRESSION: Patient reports difficulty taking deep breaths. Treatment focused on facilitating deeper breaths, relaxation to improve sleep, and balance/strength training as he fatigues quickly and needs to continue overall rehab. Tolerated all activity well.   OBJECTIVE IMPAIRMENTS: Abnormal gait, cardiopulmonary status limiting activity, decreased balance, and decreased coordination.   ACTIVITY LIMITATIONS: carrying, lifting, standing, squatting, stairs, locomotion level, and caring for others  PARTICIPATION LIMITATIONS: meal prep, cleaning, laundry, driving, occupation, and yard work  PERSONAL FACTORS: Behavior pattern, Past/current experiences, and Time since onset of injury/illness/exacerbation are also affecting patient's functional outcome.   REHAB POTENTIAL: Good  CLINICAL DECISION MAKING: Evolving/moderate complexity  EVALUATION COMPLEXITY: Moderate  PLAN:  PT FREQUENCY: 2x/week  PT DURATION: 12 weeks  PLANNED INTERVENTIONS: 97110-Therapeutic exercises, 97530- Therapeutic activity, 97112- Neuromuscular re-education, 97535- Self Care, and 09811- Manual therapy  PLAN FOR NEXT SESSION: address balance/coordination activities with narrowed stance, horizontal head turns, trunk movements.   Iona Beard, PT, DPT, OCS 05/08/2023, 2:45 PM

## 2023-05-08 NOTE — Therapy (Signed)
OUTPATIENT OCCUPATIONAL THERAPY NEURO treatment Patient Name: Bradley Davis MRN: [id number] DOB:September 04, 1985, 37 y.o., male Today's Date: 05/08/2023  PCP: Darnelle Going FNP REFERRING PROVIDER: Dr. Wynn Banker  END OF SESSION:  OT End of Session - 05/08/23 1250     Visit Number 5    Number of Visits 9    Date for OT Re-Evaluation 06/20/23    Authorization Type [facility]    Authorization - Visit Number 4    Authorization - Number of Visits 6    OT Start Time 1230    OT Stop Time 1313    OT Time Calculation (min) 43 min    Activity Tolerance Other (comment)    Behavior During Therapy WFL for tasks assessed/performed                Past Medical History:  Diagnosis Date   CKD stage 3a, GFR 45-59 ml/min (HCC)    Diabetes mellitus    Hypertension    Necrotizing fasciitis (HCC) 04/2021   right groin   Stroke [facility])    Past Surgical History:  Procedure Laterality Date   INCISION AND DRAINAGE ABSCESS Right 04/21/2021   Procedure: INCISION, DRAINAGE, DEBRIDEMENT OF RIGHT THIGH/GROIN INFECTION;  Surgeon: Abigail Miyamoto, MD;  Location: [facility];  Service: General;  Laterality: Right;   IRRIGATION AND DEBRIDEMENT ABSCESS Right 04/23/2021   Procedure: IRRIGATION AND DEBRIDEMENT RIGHT GROIN WOUND; RIGHT THIGH DRESSING CHANGE;  Surgeon: Abigail Miyamoto, MD;  Location: [facility];  Service: General;  Laterality: Right;   IRRIGATION AND DEBRIDEMENT ABSCESS Right 04/26/2021   Procedure: reexploration and debridement of soft tissue infection right groin;  Surgeon: Emelia Loron, MD;  Location: [facility];  Service: General;  Laterality: Right;   RETINAL DETACHMENT SURGERY  2022   TONSILLECTOMY  2003   Patient Active Problem List   Diagnosis Date Noted   Constipation 03/18/2023   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  Rationale for Evaluation and Treatment: Rehabilitation  SUBJECTIVE:   SUBJECTIVE STATEMENT: Pt reports his BP has been better Pt accompanied by: self  PERTINENT HISTORY: Pt presented initially to [facility] 02/27/23 with reports of nausea and vomiting for 2 days. Pt was hypertensive on evaluation with elevated creatinine. Pt was admitted for AKI and CKD stage III. 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 PMH: DM 1, HTN, CKD III.Pt received therapies at [facility] and was d/c 03/18/23  PRECAUTIONS: Fall and Other: orthostatic hypotension at times dyscoordination of RLE d/t central balance impairment   WEIGHT BEARING RESTRICTIONS: No  PAIN:  Are you having pain? No  FALLS: Has patient fallen in last 6 months? No  LIVING ENVIRONMENT: Lives with: lives alone Lives in: House/apartment   PLOF: Independent  PATIENT GOALS: return to work, improve ability to perform work activities  OBJECTIVE:   HAND DOMINANCE: Left  ADLs: Overall ADLs: increased time required Transfers/ambulation related to ADLs: Eating: mod I  Grooming: mod I UB Dressing: mod I LB Dressing: mod I Toileting: mod I at home  no assistive device Bathing: shower chair Tub Shower transfers: mod I   IADLs: Shopping: assistance for transportation Light housekeeping: has performed light cleaning of sink and toilet, no back to baseline due to balance Meal Prep: has performed very simple cooking, fatigued quickly Community mobility: mod I with rollator Medication management: mod I Financial management: mod I Handwriting: 100%  legible  MOBILITY STATUS:  mod I with rollator  ACTIVITY TOLERANCE: Activity tolerance: decreased for work activities, pt does not have the standing tolerance and balance for work activities at this time  FUNCTIONAL OUTCOME MEASURES: Quick Dash: 4.5% disability UPPER EXTREMITY ROM:  WFL for bilateral UE's    UPPER EXTREMITY MMT:   4+/5 overall for bilateral UE's    HAND FUNCTION: Grip strength: Right: 100 lbs; Left: 90 lbs  COORDINATION: 9 Hole Peg test: Right: 20.94 sec; Left: 24.18 sec  SENSATION: Intermittent tingling in left hand , sensation appears intact    COGNITION: Overall cognitive status:  denies short term memory challenges     VISION ASSESSMENT: Denies changes      PRAXIS: Impaired: Motor planning  OBSERVATIONS: Pt was pleasant and agreeable to participate in evaluation. 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   TODAY'S TREATMENT:                                                                                                                              DATE: 05/08/23- BP was checked and fine today, see vital signs. Arm bike x 6 mins level 3 for conditioning pt sustained 40 rpm, Reviewed green theraband exercises 15 reps each, min v.c, therapist recommends pt uses his walker or a chair in front for balance with standing exercise. Functional reaching activity from cabinet to lower surface, then replacing items with LUE for dynamic standing balance  and LUE functional use, no LOB. Simulated work activity picking up a box from the floor with 10 lbs weight with bilateral UE's and carrying 20" with no LOB. Fine motor coordination activity to hold multiple pegs in left hand while placing into pegboard then removing with in hand manipulation with LUE, min difficulty/ v.c Discussed importance of watching LUE during functional activity due to sensory deficits as well as using caution with hot or sharp items. Pt verbalized  understanding.  04/29/23- BP initally 147/77 Arm bike x 6 mins level 3 for conditioning, BP 160/89 following exercise. Pt was instructed in green theraband exercises 10-15 reps each, min v.c Pt was instructed in coordination HEP, see pt instructions.  04/17/23- Therapist provided education regarding energy conservation and balance seated and standing activities as well as rest/ activity. Pt reports working and sometimes becoming over fatigued. Therapsit recommends seated rest break every hour initally and that he showers in seated to conserve energy. Pt arrived wearing abdominal binder and BP was elevated, see vitals. Binder was removed and BP improved with activity. Standing for functional reaching from low to high  surface, in standing with supervision no LOB. Pt had to stop task due to nausea following this task BP 136/92 Following rest break quadraped on mat rocking forwards and backwards, then bird dog with supervision. BP end of session was 161/78   04/04/23- Pt was instructed in HEP for core stability and UE strength, min v.c and demonstration, 10 reps each. Standing in corner, holding 2.2 lbs ball pt performed diagonals each direction x 10 reps each, with close supervision for core stability and strength Arm bike x 6 mins level 3 for conditioning. Putty HEP  for sustained grip and pinch issued min v.c    03/27/23- eval only   PATIENT EDUCATION: Education details:reveiwed theraband exercises with green band Person educated: Patient Education method: Explanation, demonstration, v.c Education comprehension: verbalized understanding,returned demonstration  HOME EXERCISE PROGRAM: N/a   GOALS: Goals reviewed with patient? Yes  SHORT TERM GOALS: Target date: 04/26/23  I with initial HEP. Baseline:dependent Goal status:  ongoing 04/29/23  2.  Pt will increase LUE grip strength by 10 lbs for increased functional use. Baseline: Grip strength: Right: 100 lbs; Left: 90 lbs Goal  status: ongoing LUE 92 lbs 10/21  3.  Pt will demonstrate improved fine motor coordination  as evidenced by decreasing LUE 9 hole peg test score to 21 secs or less Baseline: 9 Hole Peg test: Right: 20.94 sec; Left: 24.18 sec Goal status: ongoing , LUE 27.33, 25.60 10/21    LONG TERM GOALS: Target date: 06/20/23  I with updated HEP Baseline:dependent Goal status: INITIAL  2.  Pt will perform simulated work activities modified independently without LOB. Baseline: dependent, has not returned to work Goal status: INITIAL  3.  Pt will perform mod complex home management and  activities modified independently. Baseline: Pt has only performed very light cooking and home management, no back to baseline. Goal status: INITIAL    ASSESSMENT:  CLINICAL IMPRESSION: Pt is progressing towards goals. He continues to demonstrate improving fine motor coordination, strength and endurance. Pt reports dropping food at work due to sensory deficits. DEFICITS: in functional skills including ADLs, IADLs, coordination, dexterity, strength, flexibility, Fine motor control, Gross motor control, mobility, balance, endurance, decreased knowledge of precautions, decreased knowledge of use of DME, and UE functional use, , and psychosocial skills including coping strategies, environmental adaptation, habits, interpersonal interactions, and routines and behaviors.   IMPAIRMENTS: are limiting patient from ADLs, IADLs, work, play, leisure, and social participation.   CO-MORBIDITIES: may have co-morbidities  that affects occupational performance. Patient will benefit from skilled OT to address above impairments and improve overall function.  MODIFICATION OR ASSISTANCE TO COMPLETE EVALUATION: No modification of tasks or assist necessary to complete an evaluation.  OT OCCUPATIONAL PROFILE AND HISTORY: Detailed assessment: Review of records and additional review of physical, cognitive, psychosocial history related to  current functional performance.  CLINICAL DECISION MAKING: LOW - limited treatment options, no task modification necessary  REHAB POTENTIAL: Good  EVALUATION COMPLEXITY: Low    PLAN:  OT FREQUENCY: 1x/week  OT DURATION:  8 sessions over 12 weeks  PLANNED INTERVENTIONS: self care/ADL training, therapeutic exercise, therapeutic activity, neuromuscular re-education, manual therapy, gait training, balance training, functional mobility training, ultrasound, moist heat, cryotherapy, patient/family education, energy conservation, coping strategies training, DME and/or AE instructions, and Re-evaluation  RECOMMENDED OTHER SERVICES: PT  CONSULTED AND AGREED WITH PLAN OF CARE: Patient  PLAN FOR NEXT SESSION: Pt has 2 remaining OT visits,work towards unmet goals and plan for d/c   Corley Maffeo, OT 05/08/2023, 1:00 PM

## 2023-05-09 ENCOUNTER — Ambulatory Visit: Payer: Medicaid Other | Admitting: Occupational Therapy

## 2023-05-09 ENCOUNTER — Encounter: Payer: Self-pay | Admitting: Physical Therapy

## 2023-05-09 ENCOUNTER — Ambulatory Visit: Payer: Medicaid Other | Admitting: Physical Therapy

## 2023-05-09 VITALS — BP 139/86

## 2023-05-09 DIAGNOSIS — M6281 Muscle weakness (generalized): Secondary | ICD-10-CM

## 2023-05-09 DIAGNOSIS — R2689 Other abnormalities of gait and mobility: Secondary | ICD-10-CM

## 2023-05-09 DIAGNOSIS — R278 Other lack of coordination: Secondary | ICD-10-CM | POA: Diagnosis not present

## 2023-05-09 DIAGNOSIS — R2681 Unsteadiness on feet: Secondary | ICD-10-CM

## 2023-05-09 DIAGNOSIS — R6889 Other general symptoms and signs: Secondary | ICD-10-CM

## 2023-05-09 DIAGNOSIS — I639 Cerebral infarction, unspecified: Secondary | ICD-10-CM

## 2023-05-09 NOTE — Therapy (Signed)
OUTPATIENT OCCUPATIONAL THERAPY NEURO treatment Patient Name: Bradley Davis MRN: [id number] DOB:[birth year], 37 y.o., male Today's Date: 05/09/2023  PCP: Darnelle Going FNP REFERRING PROVIDER: Dr. Wynn Banker  END OF SESSION:  OT End of Session - 05/09/23 1249     Visit Number 6    Number of Visits 9    Date for OT Re-Evaluation 06/20/23    Authorization Type [employer]    Authorization - Visit Number 5    Authorization - Number of Visits 6    OT Start Time 1234    OT Stop Time 1314    OT Time Calculation (min) 40 min    Behavior During Therapy WFL for tasks assessed/performed                 Past Medical History:  Diagnosis Date   CKD stage 3a, GFR 45-59 ml/min (HCC)    Diabetes mellitus    Hypertension    Necrotizing fasciitis (HCC) 04/2021   right groin   Stroke [facility])    Past Surgical History:  Procedure Laterality Date   INCISION AND DRAINAGE ABSCESS Right 04/21/2021   Procedure: INCISION, DRAINAGE, DEBRIDEMENT OF RIGHT THIGH/GROIN INFECTION;  Surgeon: Abigail Miyamoto, MD;  Location: [facility];  Service: General;  Laterality: Right;   IRRIGATION AND DEBRIDEMENT ABSCESS Right 04/23/2021   Procedure: IRRIGATION AND DEBRIDEMENT RIGHT GROIN WOUND; RIGHT THIGH DRESSING CHANGE;  Surgeon: Abigail Miyamoto, MD;  Location: [facility];  Service: General;  Laterality: Right;   IRRIGATION AND DEBRIDEMENT ABSCESS Right 04/26/2021   Procedure: reexploration and debridement of soft tissue infection right groin;  Surgeon: Emelia Loron, MD;  Location: [facility];  Service: General;  Laterality: Right;   RETINAL DETACHMENT SURGERY  2022   TONSILLECTOMY  2003   Patient Active Problem List   Diagnosis Date Noted   Constipation 03/18/2023   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   OSA (obstructive sleep apnea) 03/06/2023   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   Intractable nausea and vomiting 02/28/2023   Acute renal failure superimposed on stage 3a chronic kidney disease (HCC)  02/27/2023   DM (diabetes mellitus), 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   Essential hypertension 04/23/2021   Necrotizing fasciitis (HCC) 04/22/2021    ONSET DATE: 02/27/23  REFERRING DIAG: I69.30 (ICD-10-CM) - Unspecified sequelae of cerebral infarction  THERAPY DIAG:  Other lack of coordination  Unsteadiness on feet  Other abnormalities of gait and mobility  Muscle weakness (generalized)  Rationale for Evaluation and Treatment: Rehabilitation  SUBJECTIVE:   SUBJECTIVE STATEMENT: Pt reports numbness and tingling in LUE Pt accompanied by: self  PERTINENT HISTORY: Pt presented initially to [facility] 02/27/23 with reports of nausea and vomiting for 2 days. Pt was hypertensive on evaluation with elevated creatinine. Pt was admitted for AKI and CKD stage III. 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 PMH: DM 1, HTN, CKD III.Pt received therapies at [facility] and was d/c 03/18/23  PRECAUTIONS: Fall and Other: orthostatic hypotension at times dyscoordination of RLE d/t central balance impairment   WEIGHT BEARING RESTRICTIONS: No  PAIN:  Are you having pain? No  FALLS: Has patient fallen in last 6 months? No  LIVING ENVIRONMENT: Lives with: lives alone Lives in: House/apartment   PLOF: Independent  PATIENT GOALS: return to work, improve ability to perform work activities  OBJECTIVE:   HAND DOMINANCE: Left  ADLs: Overall ADLs: increased time required Transfers/ambulation related to ADLs: Eating: mod I  Grooming: mod I UB Dressing: mod  I LB Dressing: mod I Toileting: mod I at home no assistive device Bathing: shower chair Tub Shower transfers: mod I   IADLs: Shopping: assistance for transportation Light housekeeping: has performed light cleaning of sink and toilet, no back to baseline due to balance Meal Prep: has performed very simple cooking, fatigued quickly Community mobility: mod I with  rollator Medication management: mod I Financial management: mod I Handwriting: 100% legible  MOBILITY STATUS:  mod I with rollator  ACTIVITY TOLERANCE: Activity tolerance: decreased for work activities, pt does not have the standing tolerance and balance for work activities at this time  FUNCTIONAL OUTCOME MEASURES: Quick Dash: 4.5% disability UPPER EXTREMITY ROM:  WFL for bilateral UE's    UPPER EXTREMITY MMT:   4+/5 overall for bilateral UE's    HAND FUNCTION: Grip strength: Right: 100 lbs; Left: 90 lbs  COORDINATION: 9 Hole Peg test: Right: 20.94 sec; Left: 24.18 sec  SENSATION: Intermittent tingling in left hand , sensation appears intact    COGNITION: Overall cognitive status:  denies short term memory challenges     VISION ASSESSMENT: Denies changes      PRAXIS: Impaired: Motor planning  OBSERVATIONS: Pt was pleasant and agreeable to participate in evaluation. 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   TODAY'S TREATMENT:                                                                                                                              DATE: 05/09/23- Pt's BP continues to be stable, see vitals Arm bike x 6 mins level 4 Functional reaching and fine motor coordination to copy small peg design, min difficulty and drops Removing pegs from vertical pegboard with LUE using in hand manipulation  Simulated work activities lifting 19 lbs box from floor  to mat 5 reps x 2 with rest between sets Pt ambulated grossly feet with 19 lbs approximately 200' Gripper set at level 3 to pick up 1 inch blocks for sustained grip and endurance, min v.c      05/08/23- BP was checked and fine today, see vital signs. Arm bike x 6 mins level 3 for conditioning pt sustained 40 rpm, Reviewed green theraband exercises 15 reps each, min v.c, therapist recommends pt uses his walker or a chair in front for balance with standing exercise. Functional reaching  activity from cabinet to lower surface, then replacing items with LUE for dynamic standing balance  and LUE functional use, no LOB. Simulated work activity picking up a box from the floor with 10 lbs weight with bilateral UE's and carrying 20" with no LOB. Fine motor coordination activity to hold multiple pegs in left hand while placing into pegboard then removing with in hand manipulation with LUE, min difficulty/ v.c Discussed importance of watching LUE during functional activity due to sensory deficits as well as using caution with hot or sharp items. Pt verbalized understanding.  04/29/23- BP initally 147/77  Arm bike x 6 mins level 3 for conditioning, BP 160/89 following exercise. Pt was instructed in green theraband exercises 10-15 reps each, min v.c Pt was instructed in coordination HEP, see pt instructions.  04/17/23- Therapist provided education regarding energy conservation and balance seated and standing activities as well as rest/ activity. Pt reports working and sometimes becoming over fatigued. Therapsit recommends seated rest break every hour initally and that he showers in seated to conserve energy. Pt arrived wearing abdominal binder and BP was elevated, see vitals. Binder was removed and BP improved with activity. Standing for functional reaching from low to high surface, in standing with supervision no LOB. Pt had to stop task due to nausea following this task BP 136/92 Following rest break quadraped on mat rocking forwards and backwards, then bird dog with supervision. BP end of session was 161/78   04/04/23- Pt was instructed in HEP for core stability and UE strength, min v.c and demonstration, 10 reps each. Standing in corner, holding 2.2 lbs ball pt performed diagonals each direction x 10 reps each, with close supervision for core stability and strength Arm bike x 6 mins level 3 for conditioning. Putty HEP  for sustained grip and pinch issued min v.c    03/27/23- eval only    PATIENT EDUCATION: see above Person educated: Patient Education method: Explanation, demonstration, v.c Education comprehension: verbalized understanding,returned demonstration  HOME EXERCISE PROGRAM: N/a   GOALS: Goals reviewed with patient? Yes  SHORT TERM GOALS: Target date: 04/26/23  I with initial HEP. Baseline:dependent Goal status:  met 05/08/23  2.  Pt will increase LUE grip strength by 10 lbs for increased functional use. Baseline: Grip strength: Right: 100 lbs; Left: 90 lbs Goal status: ongoing LUE 92 lbs 10/21  3.  Pt will demonstrate improved fine motor coordination  as evidenced by decreasing LUE 9 hole peg test score to 21 secs or less Baseline: 9 Hole Peg test: Right: 20.94 sec; Left: 24.18 sec Goal status: ongoing , LUE 27.33, 25.60 10/21    LONG TERM GOALS: Target date: 06/20/23  I with updated HEP Baseline:dependent Goal status: INITIAL  2.  Pt will perform simulated work activities modified independently without LOB. Baseline: dependent, has not returned to work Goal status: INITIAL  3.  Pt will perform mod complex home management and  activities modified independently. Baseline: Pt has only performed very light cooking and home management, no back to baseline. Goal status: INITIAL    ASSESSMENT:  CLINICAL IMPRESSION: Pt is progressing towards goals. Pt demonstrates improving strength and however he remains limited by endurance and LUe sensory deficits. DEFICITS: in functional skills including ADLs, IADLs, coordination, dexterity, strength, flexibility, Fine motor control, Gross motor control, mobility, balance, endurance, decreased knowledge of precautions, decreased knowledge of use of DME, and UE functional use, , and psychosocial skills including coping strategies, environmental adaptation, habits, interpersonal interactions, and routines and behaviors.   IMPAIRMENTS: are limiting patient from ADLs, IADLs, work, play, leisure, and social  participation.   CO-MORBIDITIES: may have co-morbidities  that affects occupational performance. Patient will benefit from skilled OT to address above impairments and improve overall function.  MODIFICATION OR ASSISTANCE TO COMPLETE EVALUATION: No modification of tasks or assist necessary to complete an evaluation.  OT OCCUPATIONAL PROFILE AND HISTORY: Detailed assessment: Review of records and additional review of physical, cognitive, psychosocial history related to current functional performance.  CLINICAL DECISION MAKING: LOW - limited treatment options, no task modification necessary  REHAB POTENTIAL: Good  EVALUATION COMPLEXITY: Low  PLAN:  OT FREQUENCY: 1x/week  OT DURATION:  8 sessions over 12 weeks  PLANNED INTERVENTIONS: self care/ADL training, therapeutic exercise, therapeutic activity, neuromuscular re-education, manual therapy, gait training, balance training, functional mobility training, ultrasound, moist heat, cryotherapy, patient/family education, energy conservation, coping strategies training, DME and/or AE instructions, and Re-evaluation  RECOMMENDED OTHER SERVICES: PT  CONSULTED AND AGREED WITH PLAN OF CARE: Patient  PLAN FOR NEXT SESSION: Pt has 1 remaining OT visits,w check goals, plan for d/c   Salam Chesterfield, OT 05/09/2023, 12:50 PM

## 2023-05-09 NOTE — Therapy (Signed)
OUTPATIENT PHYSICAL THERAPY NEURO TREATMENT   Patient Name: Bradley Davis MRN: [id number] DOB:[birth year], 37 y.o., male Today's Date: 05/09/2023   PCP: Darnelle Going REFERRING PROVIDER: Delle Reining, PA  END OF SESSION:  PT End of Session - 05/09/23 1250     Visit Number 4    Date for PT Re-Evaluation 07/17/23    PT Start Time 1314    PT Stop Time 1358    PT Time Calculation (min) 44 min    Activity Tolerance Patient tolerated treatment well    Behavior During Therapy [facility] for tasks assessed/performed              Past Medical History:  Diagnosis Date   CKD stage 3a, GFR 45-59 ml/min (HCC)    Diabetes mellitus    Hypertension    Necrotizing fasciitis (HCC) 04/2021   right groin   Stroke [facility])    Past Surgical History:  Procedure Laterality Date   INCISION AND DRAINAGE ABSCESS Right 04/21/2021   Procedure: INCISION, DRAINAGE, DEBRIDEMENT OF RIGHT THIGH/GROIN INFECTION;  Surgeon: Abigail Miyamoto, MD;  Location: [facility];  Service: General;  Laterality: Right;   IRRIGATION AND DEBRIDEMENT ABSCESS Right 04/23/2021   Procedure: IRRIGATION AND DEBRIDEMENT RIGHT GROIN WOUND; RIGHT THIGH DRESSING CHANGE;  Surgeon: Abigail Miyamoto, MD;  Location: [facility];  Service: General;  Laterality: Right;   IRRIGATION AND DEBRIDEMENT ABSCESS Right 04/26/2021   Procedure: reexploration and debridement of soft tissue infection right groin;  Surgeon: Emelia Loron, MD;  Location: [facility];  Service: General;  Laterality: Right;   RETINAL DETACHMENT SURGERY  2022   TONSILLECTOMY  2003   Patient Active Problem List   Diagnosis Date Noted   Constipation 03/18/2023   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   OSA (obstructive sleep apnea) 03/06/2023   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   Intractable nausea and vomiting 02/28/2023   Acute renal failure superimposed on stage 3a chronic kidney disease (HCC) 02/27/2023   DM (diabetes mellitus), 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    Essential hypertension 04/23/2021   Necrotizing fasciitis (HCC) 04/22/2021    ONSET DATE: 02/09/23   REFERRING DIAG: cerebellar stroke  THERAPY DIAG:  Unsteadiness on feet  Other abnormalities of gait and mobility  Muscle weakness (generalized)  Cerebellar stroke (HCC)  Decreased activity tolerance  Rationale for Evaluation and Treatment: Rehabilitation  SUBJECTIVE:                                                                                                                                                                                             SUBJECTIVE STATEMENT: Patient reports that he is very  fatigued after PT, reports no stumbles or falls. Pt accompanied by: self  PERTINENT HISTORY: cerebellar stroke also renal disease, hospitalized 8/3/to 9/9/224 for rehab.  Referred to outpt PT  PAIN:  Are you having pain? No  PRECAUTIONS: Other: BP  RED FLAGS: None   WEIGHT BEARING RESTRICTIONS: No  FALLS: Has patient fallen in last 6 months? Yes. Number of falls 4  LIVING ENVIRONMENT: Lives with: lives with their family Lives in: House/apartment Has following equipment at home: Single point cane and Environmental consultant - 4 wheeled  PLOF: Independent  PATIENT GOALS: return to I driving, walking, managing my food truck  OBJECTIVE:  Note: Objective measures were completed at Evaluation unless otherwise noted.  DIAGNOSTIC FINDINGS:copied from hospitalist notes: MRI brain done for workup showing small acute infarct in lateral right medulla and deep right cerebral white matter adjacent to lateral ventricle.   COGNITION: Overall cognitive status: Within functional limits for tasks assessed   SENSATION: WFL  COORDINATION: Alt toe taps, symmetrical toe taps in sitting wnl  EDEMA:  None noted  MUSCLE TONE: all wnl    LOWER EXTREMITY ROM:   all LE ROM wnl  LOWER EXTREMITY MMT:  burst testing B LE 's wnl  BED MOBILITY: NA today   TRANSFERS: Assistive device  utilized: Environmental consultant - 4 wheeled  Sit to stand: Modified independence Stand to sit: Modified independence   GAIT: Gait pattern: lateral lean- Right and wide BOS Distance walked: 80 Assistive device utilized: Walker - 4 wheeled Level of assistance: Modified independence   Monitored BO in lobby,BP 166/106 After walking 40' lobby to clinic: BP 187/115 After walking 80' BP 196/117 Stopped eval at this pt and called PCP to report he was advised to go to urgent care FUNCTIONAL TESTS:  FGA 16/30  PATIENT SURVEYS:    TODAY'S TREATMENT:                                                                                                                              DATE:  05/09/23 Nustep level 5 x 5 minutes Gait outside around the back parking [name] and then to the front door with Rollator and SBA 35# leg curls 2x10 Leg extension 10# 2x10 Leg press 70# 2x10 Calf press 70# 2x10 In pbars side stepping on airex balance beam, then tandem walk On airex ball toss On airex eyes closed On bosu upside down balance Sit to stand weighted ball 6.6# overhead reach Walking ball toss  05/08/23 Bike L3.5 x 4 minutes, 30 sec fast pedalling, RHR/SATS 93/97, EHR/SATS 98/97 Squats with 6# ball x 10 Side lunge with 6# ball x 10 each way to challenge both strength and balance Patient developed R piriformis spasm Lower and upper body stretching to relieve spasm and improve his sleep-lower traps stretch, figure 4 in sit, glut stretch in sit, HS stretch, deep STM with tennis ball Educated to deep breathing techniques to improve his breathing and promote improved relaxation before sleep.  04/29/23:  Completed FGA: 16/30 Mini jumps B LE's 30 sec in ll bars Jogging in ll bars 30 sec  Large step with reaches across trunk to target on wall behind  Dribbling 65 cm green physioball x 40' L hand, 40' R hand Wall ladder climbs 15 x each side, added horizontal head turns Corner for semitandem standing for horizontal  head turns. Leg press 70# B LE's  Leg press 70# B ankle plantarflexors  04/24/23   PATIENT EDUCATION: Education details: POC, goals Person educated: Patient Education method: Explanation, Demonstration, Tactile cues, and Verbal cues Education comprehension: verbalized understanding  HOME EXERCISE PROGRAM:  [id number]  GOALS: Goals reviewed with patient? Yes  SHORT TERM GOALS: Target date: 2 weeks 05/08/23  IHEP  Baseline:TBD Goal status: 05/08/23 Provided, ongoing   LONG TERM GOALS: Target date: 07/17/23  Able to complete FGA and score 30/30 Baseline: TBA 04/29/23: FGA 16/30 Goal status: INITIAL  2.  Able to walk greater than 1000' maintaining pace of 1 m/sec for consistent and efficient community ambulation Baseline: TBD Goal status: INITIAL  3.  Sit to stand 30 sec, 15 sec or more to demonstrate endurance, coordination, functional strength Baseline: TBD Goal status: INITIAL  ASSESSMENT:  CLINICAL IMPRESSION: Patient reports doing okay, just tired after treatment, I added more walking, strength and balance today.  He had had a few instances that he was off balance on the solid surfaces with turning after an exercise   OBJECTIVE IMPAIRMENTS: Abnormal gait, cardiopulmonary status limiting activity, decreased balance, and decreased coordination.   ACTIVITY LIMITATIONS: carrying, lifting, standing, squatting, stairs, locomotion level, and caring for others  PARTICIPATION LIMITATIONS: meal prep, cleaning, laundry, driving, occupation, and yard work  PERSONAL FACTORS: Behavior pattern, Past/current experiences, and Time since onset of injury/illness/exacerbation are also affecting patient's functional outcome.   REHAB POTENTIAL: Good  CLINICAL DECISION MAKING: Evolving/moderate complexity  EVALUATION COMPLEXITY: Moderate  PLAN:  PT FREQUENCY: 2x/week  PT DURATION: 12 weeks  PLANNED INTERVENTIONS: 97110-Therapeutic exercises, 97530- Therapeutic activity, 97112-  Neuromuscular re-education, 97535- Self Care, and [id number]- Manual therapy  PLAN FOR NEXT SESSION: address balance/coordination activities with narrowed stance, horizontal head turns, trunk movements.   Jearld Lesch, PT 05/09/2023, 1:00 PM

## 2023-05-13 ENCOUNTER — Ambulatory Visit: Payer: Medicaid Other | Attending: Physical Medicine and Rehabilitation | Admitting: Occupational Therapy

## 2023-05-13 ENCOUNTER — Ambulatory Visit: Payer: Medicaid Other | Admitting: Physical Therapy

## 2023-05-13 ENCOUNTER — Encounter: Payer: Self-pay | Admitting: Physical Therapy

## 2023-05-13 DIAGNOSIS — R2681 Unsteadiness on feet: Secondary | ICD-10-CM

## 2023-05-13 DIAGNOSIS — R2689 Other abnormalities of gait and mobility: Secondary | ICD-10-CM | POA: Diagnosis present

## 2023-05-13 DIAGNOSIS — R6889 Other general symptoms and signs: Secondary | ICD-10-CM

## 2023-05-13 DIAGNOSIS — R278 Other lack of coordination: Secondary | ICD-10-CM | POA: Insufficient documentation

## 2023-05-13 DIAGNOSIS — I639 Cerebral infarction, unspecified: Secondary | ICD-10-CM | POA: Insufficient documentation

## 2023-05-13 DIAGNOSIS — M6281 Muscle weakness (generalized): Secondary | ICD-10-CM | POA: Insufficient documentation

## 2023-05-13 NOTE — Therapy (Signed)
OUTPATIENT PHYSICAL THERAPY NEURO TREATMENT   Patient Name: Bradley Davis MRN: [id number] DOB:May 19, 1986, 37 y.o., male Today's Date: 05/13/2023   PCP: Bradley Davis REFERRING PROVIDER: Delle Reining, PA  END OF SESSION:  PT End of Session - 05/13/23 1327     Visit Number 5    Date for PT Re-Evaluation 07/17/23    PT Start Time 1355    PT Stop Time 1442    PT Time Calculation (min) 47 min    Activity Tolerance Patient tolerated treatment well    Behavior During Therapy [facility] for tasks assessed/performed              Past Medical History:  Diagnosis Date   CKD stage 3a, GFR 45-59 ml/min (HCC)    Diabetes mellitus    Hypertension    Necrotizing fasciitis (HCC) 04/2021   right groin   Stroke [facility])    Past Surgical History:  Procedure Laterality Date   INCISION AND DRAINAGE ABSCESS Right 04/21/2021   Procedure: INCISION, DRAINAGE, DEBRIDEMENT OF RIGHT THIGH/GROIN INFECTION;  Surgeon: Bradley Miyamoto, MD;  Location: [facility];  Service: General;  Laterality: Right;   IRRIGATION AND DEBRIDEMENT ABSCESS Right 04/23/2021   Procedure: IRRIGATION AND DEBRIDEMENT RIGHT GROIN WOUND; RIGHT THIGH DRESSING CHANGE;  Surgeon: Bradley Miyamoto, MD;  Location: [facility];  Service: General;  Laterality: Right;   IRRIGATION AND DEBRIDEMENT ABSCESS Right 04/26/2021   Procedure: reexploration and debridement of soft tissue infection right groin;  Surgeon: Bradley Loron, MD;  Location: [facility];  Service: General;  Laterality: Right;   RETINAL DETACHMENT SURGERY  2022   TONSILLECTOMY  2003   Patient Active Problem List   Diagnosis Date Noted   Constipation 03/18/2023   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   OSA (obstructive sleep apnea) 03/06/2023   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   Intractable nausea and vomiting 02/28/2023   Acute renal failure superimposed on stage 3a chronic kidney disease (HCC) 02/27/2023   DM (diabetes mellitus), 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    Essential hypertension 04/23/2021   Necrotizing fasciitis (HCC) 04/22/2021    ONSET DATE: 02/09/23   REFERRING DIAG: cerebellar stroke  THERAPY DIAG:  Muscle weakness (generalized)  Decreased activity tolerance  Unsteadiness on feet  Other abnormalities of gait and mobility  Cerebellar stroke (HCC)  Rationale for Evaluation and Treatment: Rehabilitation  SUBJECTIVE:                                                                                                                                                                                             SUBJECTIVE STATEMENT: Patient reports that he has tried  to walk without the walker recently.  I worked and was very tired Pt accompanied by: self  PERTINENT HISTORY: cerebellar stroke also renal disease, hospitalized 8/3/to 9/9/224 for rehab.  Referred to outpt PT  PAIN:  Are you having pain? No  PRECAUTIONS: Other: BP  RED FLAGS: None   WEIGHT BEARING RESTRICTIONS: No  FALLS: Has patient fallen in last 6 months? Yes. Number of falls 4  LIVING ENVIRONMENT: Lives with: lives with their family Lives in: House/apartment Has following equipment at home: Single point cane and Environmental consultant - 4 wheeled  PLOF: Independent  PATIENT GOALS: return to I driving, walking, managing my food truck  OBJECTIVE:  Note: Objective measures were completed at Evaluation unless otherwise noted.  DIAGNOSTIC FINDINGS:copied from hospitalist notes: MRI brain done for workup showing small acute infarct in lateral right medulla and deep right cerebral white matter adjacent to lateral ventricle.   COGNITION: Overall cognitive status: Within functional limits for tasks assessed   SENSATION: WFL  COORDINATION: Alt toe taps, symmetrical toe taps in sitting wnl  EDEMA:  None noted  MUSCLE TONE: all wnl    LOWER EXTREMITY ROM:   all LE ROM wnl  LOWER EXTREMITY MMT:  burst testing B LE 's wnl  BED MOBILITY: NA  today   TRANSFERS: Assistive device utilized: Environmental consultant - 4 wheeled  Sit to stand: Modified independence Stand to sit: Modified independence   GAIT: Gait pattern: lateral lean- Right and wide BOS Distance walked: 80 Assistive device utilized: Walker - 4 wheeled Level of assistance: Modified independence   Monitored BO in lobby,BP 166/106 After walking 40' lobby to clinic: BP 187/115 After walking 80' BP 196/117 Stopped eval at this pt and called PCP to report he was advised to go to urgent care FUNCTIONAL TESTS:  FGA 16/30  PATIENT SURVEYS:    TODAY'S TREATMENT:                                                                                                                              DATE:  05/13/23 Gait outside with SBA without device around the back building with no rest, good pace, SOB and some wobbly legs at the end 10# straight arm extension 2x10 Leg press 70# 3x10 Resisted walking 50# all directions SLS  Gait outside no device SBA around the parking island in the back BP after this 172/94,, after 2 minutes rest 174/95  05/09/23 Nustep level 5 x 5 minutes Gait outside around the back parking [name] and then to the front door with Rollator and SBA 35# leg curls 2x10 Leg extension 10# 2x10 Leg press 70# 2x10 Calf press 70# 2x10 In pbars side stepping on airex balance beam, then tandem walk On airex ball toss On airex eyes closed On bosu upside down balance Sit to stand weighted ball 6.6# overhead reach Walking ball toss  05/08/23 Bike L3.5 x 4 minutes, 30 sec fast pedalling, RHR/SATS 93/97, EHR/SATS 98/97 Squats with 6# ball  x 10 Side lunge with 6# ball x 10 each way to challenge both strength and balance Patient developed R piriformis spasm Lower and upper body stretching to relieve spasm and improve his sleep-lower traps stretch, figure 4 in sit, glut stretch in sit, HS stretch, deep STM with tennis ball Educated to deep breathing techniques to improve his  breathing and promote improved relaxation before sleep.  04/29/23:  Completed FGA: 16/30 Mini jumps B LE's 30 sec in ll bars Jogging in ll bars 30 sec  Large step with reaches across trunk to target on wall behind  Dribbling 65 cm green physioball x 40' L hand, 40' R hand Wall ladder climbs 15 x each side, added horizontal head turns Corner for semitandem standing for horizontal head turns. Leg press 70# B LE's  Leg press 70# B ankle plantarflexors  04/24/23   PATIENT EDUCATION: Education details: POC, goals Person educated: Patient Education method: Explanation, Demonstration, Tactile cues, and Verbal cues Education comprehension: verbalized understanding  HOME EXERCISE PROGRAM:  [id number]  GOALS: Goals reviewed with patient? Yes  SHORT TERM GOALS: Target date: 2 weeks 05/08/23  IHEP  Baseline:TBD Goal status: 05/08/23 Provided, ongoing   LONG TERM GOALS: Target date: 07/17/23  Able to complete FGA and score 30/30 Baseline: TBA 04/29/23: FGA 16/30 Goal status: INITIAL  2.  Able to walk greater than 1000' maintaining pace of 1 m/sec for consistent and efficient community ambulation Baseline: TBD Goal status: progressing  3.  Sit to stand 30 sec, 15 sec or more to demonstrate endurance, coordination, functional strength Baseline: TBD Goal status: INITIAL  ASSESSMENT:  CLINICAL IMPRESSION: Patient gets very fatigued and short of breath with the activity especially walking.  His BP is about 20 points lower for systolic and diastolic from the initial eval.  At the end of the walks he does have "wobbly" legs and is off balance a little but never really loses balance   OBJECTIVE IMPAIRMENTS: Abnormal gait, cardiopulmonary status limiting activity, decreased balance, and decreased coordination.   ACTIVITY LIMITATIONS: carrying, lifting, standing, squatting, stairs, locomotion level, and caring for others  PARTICIPATION LIMITATIONS: meal prep, cleaning, laundry, driving,  occupation, and yard work  PERSONAL FACTORS: Behavior pattern, Past/current experiences, and Time since onset of injury/illness/exacerbation are also affecting patient's functional outcome.   REHAB POTENTIAL: Good  CLINICAL DECISION MAKING: Evolving/moderate complexity  EVALUATION COMPLEXITY: Moderate  PLAN:  PT FREQUENCY: 2x/week  PT DURATION: 12 weeks  PLANNED INTERVENTIONS: 97110-Therapeutic exercises, 97530- Therapeutic activity, [id number]- Neuromuscular re-education, 97535- Self Care, and [id number]- Manual therapy  PLAN FOR NEXT SESSION:continue to progress strength and function  Clifton Safley W, PT 05/13/2023, 1:28 PM

## 2023-05-13 NOTE — Therapy (Signed)
OUTPATIENT OCCUPATIONAL THERAPY NEURO treatment Patient Name: Bradley Davis MRN: [id number] DOB:May 30, 1986, 37 y.o., male Today's Date: 05/13/2023  PCP: Darnelle Going FNP REFERRING PROVIDER: Dr. Wynn Banker OCCUPATIONAL THERAPY DISCHARGE SUMMARY   Current functional level related to goals / functional outcomes: Pt met 3/3 long term goals.   Remaining deficits: Sensory deficits, decreased strength, decreased endurance, decreased coordination   Education / Equipment: Pt was educated regarding the following: sensory precautions, HEP for strength and coordination. Pt  demonstrates understanding.  Patient agrees to discharge. Patient goals were partially met. Patient is being discharged due to being pleased with the current functional level. Pt has a HEP from occupational therapy to work on at home Pt plans to continue working on balance and conditioning with PT.     END OF SESSION:  OT End of Session - 05/13/23 1325     Visit Number 7    Number of Visits 9    Date for OT Re-Evaluation 06/20/23    Authorization Type [employer]    Authorization - Visit Number 6    Authorization - Number of Visits 6    OT Start Time 1320    OT Stop Time 1400    OT Time Calculation (min) 40 min    Behavior During Therapy WFL for tasks assessed/performed                  Past Medical History:  Diagnosis Date   CKD stage 3a, GFR 45-59 ml/min (HCC)    Diabetes mellitus    Hypertension    Necrotizing fasciitis (HCC) 04/2021   right groin   Stroke [facility])    Past Surgical History:  Procedure Laterality Date   INCISION AND DRAINAGE ABSCESS Right 04/21/2021   Procedure: INCISION, DRAINAGE, DEBRIDEMENT OF RIGHT THIGH/GROIN INFECTION;  Surgeon: Abigail Miyamoto, MD;  Location: [facility];  Service: General;  Laterality: Right;   IRRIGATION AND DEBRIDEMENT ABSCESS Right 04/23/2021   Procedure: IRRIGATION AND DEBRIDEMENT RIGHT GROIN WOUND; RIGHT THIGH DRESSING CHANGE;  Surgeon: Abigail Miyamoto, MD;  Location: [facility];  Service: General;  Laterality: Right;   IRRIGATION AND DEBRIDEMENT ABSCESS Right 04/26/2021   Procedure: reexploration and debridement of soft tissue infection right groin;  Surgeon: Emelia Loron, MD;  Location: [facility];  Service: General;  Laterality: Right;   RETINAL DETACHMENT SURGERY  2022   TONSILLECTOMY  2003   Patient Active Problem List   Diagnosis Date Noted   Constipation 03/18/2023   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   OSA (obstructive sleep apnea) 03/06/2023   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   Intractable nausea and vomiting 02/28/2023   Acute renal failure superimposed on stage 3a chronic kidney disease (HCC) 02/27/2023   DM (diabetes mellitus), 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   Essential hypertension 04/23/2021   Necrotizing fasciitis (HCC) 04/22/2021    ONSET DATE: 02/27/23  REFERRING DIAG: I69.30 (ICD-10-CM) - Unspecified sequelae of cerebral infarction  THERAPY DIAG:  Muscle weakness (generalized)  Decreased activity tolerance  Other lack of coordination  Rationale for Evaluation and Treatment: Rehabilitation  SUBJECTIVE:   SUBJECTIVE STATEMENT: Pt reports tingling in LUE Pt accompanied by: self  PERTINENT HISTORY: Pt presented initially to [facility] 02/27/23 with reports of nausea and vomiting for 2 days. Pt was hypertensive on evaluation with elevated creatinine. Pt was admitted for AKI and CKD stage III. 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 PMH: DM 1, HTN, CKD III.Pt received therapies at [facility] and was  d/c 03/18/23  PRECAUTIONS: Fall and Other: orthostatic hypotension at times dyscoordination of RLE d/t central balance impairment   WEIGHT BEARING RESTRICTIONS: No  PAIN:  Are you having pain? No  FALLS: Has patient fallen in last 6 months? No  LIVING ENVIRONMENT: Lives with: lives alone Lives in: House/apartment   PLOF:  Independent  PATIENT GOALS: return to work, improve ability to perform work activities  OBJECTIVE:   HAND DOMINANCE: Left  ADLs: Overall ADLs: increased time required Transfers/ambulation related to ADLs: Eating: mod I  Grooming: mod I UB Dressing: mod I LB Dressing: mod I Toileting: mod I at home no assistive device Bathing: shower chair Tub Shower transfers: mod I   IADLs: Shopping: assistance for transportation Light housekeeping: has performed light cleaning of sink and toilet, no back to baseline due to balance Meal Prep: has performed very simple cooking, fatigued quickly Community mobility: mod I with rollator Medication management: mod I Financial management: mod I Handwriting: 100% legible  MOBILITY STATUS:  mod I with rollator  ACTIVITY TOLERANCE: Activity tolerance: decreased for work activities, pt does not have the standing tolerance and balance for work activities at this time  FUNCTIONAL OUTCOME MEASURES: Quick Dash: 4.5% disability UPPER EXTREMITY ROM:  WFL for bilateral UE's    UPPER EXTREMITY MMT:   4+/5 overall for bilateral UE's    HAND FUNCTION: Grip strength: Right: 100 lbs; Left: 90 lbs  COORDINATION: 9 Hole Peg test: Right: 20.94 sec; Left: 24.18 sec  SENSATION: Intermittent tingling in left hand , sensation appears intact    COGNITION: Overall cognitive status:  denies short term memory challenges     VISION ASSESSMENT: Denies changes      PRAXIS: Impaired: Motor planning  OBSERVATIONS: Pt was pleasant and agreeable to participate in evaluation. 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   TODAY'S TREATMENT:                                                                                                                              DATE: 05/13/23 BP=156/92 Therapist checked progress towards goals and discussed plans for d/c. Pt is in agreement  See goals for progress  Pt ambulated approx 250' with a 19  lbs box for conditioning and simulated work activities Then lifting 19 lbs box from lower mat to countertop 2 sets of 5 reps, for simulated work activities no LOB.   Reviewed green theraband HEP 10 reps each for UE strength and endurance  05/09/23- Pt's BP continues to be stable, see vitals Arm bike x 6 mins level 4 Functional reaching and fine motor coordination to copy small peg design, min difficulty and drops Removing pegs from vertical pegboard with LUE using in hand manipulation  Simulated work activities lifting 19 lbs box from floor  to mat 5 reps x 2 with rest between sets Pt ambulated grossly feet with 19 lbs approximately 200' Gripper set at level 3  to pick up 1 inch blocks for sustained grip and endurance, min v.c      05/08/23- BP was checked and fine today, see vital signs. Arm bike x 6 mins level 3 for conditioning pt sustained 40 rpm, Reviewed green theraband exercises 15 reps each, min v.c, therapist recommends pt uses his walker or a chair in front for balance with standing exercise. Functional reaching activity from cabinet to lower surface, then replacing items with LUE for dynamic standing balance  and LUE functional use, no LOB. Simulated work activity picking up a box from the floor with 10 lbs weight with bilateral UE's and carrying 20" with no LOB. Fine motor coordination activity to hold multiple pegs in left hand while placing into pegboard then removing with in hand manipulation with LUE, min difficulty/ v.c Discussed importance of watching LUE during functional activity due to sensory deficits as well as using caution with hot or sharp items. Pt verbalized understanding.  04/29/23- BP initally 147/77 Arm bike x 6 mins level 3 for conditioning, BP 160/89 following exercise. Pt was instructed in green theraband exercises 10-15 reps each, min v.c Pt was instructed in coordination HEP, see pt instructions.  04/17/23- Therapist provided education regarding energy  conservation and balance seated and standing activities as well as rest/ activity. Pt reports working and sometimes becoming over fatigued. Therapsit recommends seated rest break every hour initally and that he showers in seated to conserve energy. Pt arrived wearing abdominal binder and BP was elevated, see vitals. Binder was removed and BP improved with activity. Standing for functional reaching from low to high surface, in standing with supervision no LOB. Pt had to stop task due to nausea following this task BP 136/92 Following rest break quadraped on mat rocking forwards and backwards, then bird dog with supervision. BP end of session was 161/78   04/04/23- Pt was instructed in HEP for core stability and UE strength, min v.c and demonstration, 10 reps each. Standing in corner, holding 2.2 lbs ball pt performed diagonals each direction x 10 reps each, with close supervision for core stability and strength Arm bike x 6 mins level 3 for conditioning. Putty HEP  for sustained grip and pinch issued min v.c    03/27/23- eval only   PATIENT EDUCATION:  progress towards goals, green theraband HEP. Person educated: Patient Education method: Explanation, demonstration, v.c Education comprehension: verbalized understanding,returned demonstration  HOME EXERCISE PROGRAM: N/a   GOALS: Goals reviewed with patient? Yes  SHORT TERM GOALS: Target date: 04/26/23  I with initial HEP. Baseline:dependent Goal status:  met 05/08/23  2.  Pt will increase LUE grip strength by 10 lbs for increased functional use. Baseline: Grip strength: Right: 100 lbs; Left: 90 lbs Goal status: not met yet improved 95 lbs.   3.  Pt will demonstrate improved fine motor coordination  as evidenced by decreasing LUE 9 hole peg test score to 21 secs or less Baseline: 9 Hole Peg test: Right: 20.94 sec; Left: 24.18 sec Goal status:not met 25.10 secs    LONG TERM GOALS: Target date: 06/20/23  I with updated  HEP Baseline:dependent Goal status: met 05/13/23.  2.  Pt will perform simulated work activities modified independently without LOB. Baseline: dependent, has not returned to work Goal status:met 05/13/23  3.  Pt will perform mod complex home management and  activities modified independently. Baseline: Pt has only performed very light cooking and home management, no back to baseline. Goal status:met, 05/13/23    ASSESSMENT:  CLINICAL  IMPRESSION: Pt made good overall progress. He did not fully meet  all short term goals due to sensory deficits. Pt met 3/3 long term goals. DEFICITS: in functional skills including ADLs, IADLs, coordination, dexterity, strength, flexibility, Fine motor control, Gross motor control, mobility, balance, endurance, decreased knowledge of precautions, decreased knowledge of use of DME, and UE functional use, , and psychosocial skills including coping strategies, environmental adaptation, habits, interpersonal interactions, and routines and behaviors.   IMPAIRMENTS: are limiting patient from ADLs, IADLs, work, play, leisure, and social participation.   CO-MORBIDITIES: may have co-morbidities  that affects occupational performance. Patient will benefit from skilled OT to address above impairments and improve overall function.  MODIFICATION OR ASSISTANCE TO COMPLETE EVALUATION: No modification of tasks or assist necessary to complete an evaluation.  OT OCCUPATIONAL PROFILE AND HISTORY: Detailed assessment: Review of records and additional review of physical, cognitive, psychosocial history related to current functional performance.  CLINICAL DECISION MAKING: LOW - limited treatment options, no task modification necessary  REHAB POTENTIAL: Good  EVALUATION COMPLEXITY: Low    PLAN:  OT FREQUENCY: 1x/week  OT DURATION:  8 sessions over 12 weeks  PLANNED INTERVENTIONS: self care/ADL training, therapeutic exercise, therapeutic activity, neuromuscular  re-education, manual therapy, gait training, balance training, functional mobility training, ultrasound, moist heat, cryotherapy, patient/family education, energy conservation, coping strategies training, DME and/or AE instructions, and Re-evaluation  RECOMMENDED OTHER SERVICES: PT  CONSULTED AND AGREED WITH PLAN OF CARE: Patient  PLAN FOR NEXT SESSION: d/c OT, pt to continue working with PT Miner Koral, OT 05/13/2023, 1:26 PM

## 2023-05-15 ENCOUNTER — Ambulatory Visit: Payer: Medicaid Other | Admitting: Physical Therapy

## 2023-05-15 ENCOUNTER — Encounter: Payer: Self-pay | Admitting: Physical Therapy

## 2023-05-15 ENCOUNTER — Ambulatory Visit: Payer: Medicaid Other | Admitting: Occupational Therapy

## 2023-05-15 DIAGNOSIS — R2689 Other abnormalities of gait and mobility: Secondary | ICD-10-CM

## 2023-05-15 DIAGNOSIS — R6889 Other general symptoms and signs: Secondary | ICD-10-CM

## 2023-05-15 DIAGNOSIS — R2681 Unsteadiness on feet: Secondary | ICD-10-CM

## 2023-05-15 DIAGNOSIS — M6281 Muscle weakness (generalized): Secondary | ICD-10-CM

## 2023-05-15 DIAGNOSIS — I639 Cerebral infarction, unspecified: Secondary | ICD-10-CM

## 2023-05-15 NOTE — Therapy (Signed)
OUTPATIENT PHYSICAL THERAPY NEURO TREATMENT   Patient Name: Bradley Davis MRN: [id number] DOB:July 10, 1985, 37 y.o., male Today's Date: 05/15/2023   PCP: Darnelle Going REFERRING PROVIDER: Delle Reining, PA  END OF SESSION:  PT End of Session - 05/15/23 1326     Visit Number 6    Date for PT Re-Evaluation 07/17/23    PT Start Time 1315    PT Stop Time 1400    PT Time Calculation (min) 45 min    Activity Tolerance Patient tolerated treatment well    Behavior During Therapy [facility] for tasks assessed/performed              Past Medical History:  Diagnosis Date   CKD stage 3a, GFR 45-59 ml/min (HCC)    Diabetes mellitus    Hypertension    Necrotizing fasciitis (HCC) 04/2021   right groin   Stroke [facility])    Past Surgical History:  Procedure Laterality Date   INCISION AND DRAINAGE ABSCESS Right 04/21/2021   Procedure: INCISION, DRAINAGE, DEBRIDEMENT OF RIGHT THIGH/GROIN INFECTION;  Surgeon: Abigail Miyamoto, MD;  Location: [facility];  Service: General;  Laterality: Right;   IRRIGATION AND DEBRIDEMENT ABSCESS Right 04/23/2021   Procedure: IRRIGATION AND DEBRIDEMENT RIGHT GROIN WOUND; RIGHT THIGH DRESSING CHANGE;  Surgeon: Abigail Miyamoto, MD;  Location: [facility];  Service: General;  Laterality: Right;   IRRIGATION AND DEBRIDEMENT ABSCESS Right 04/26/2021   Procedure: reexploration and debridement of soft tissue infection right groin;  Surgeon: Emelia Loron, MD;  Location: [facility];  Service: General;  Laterality: Right;   RETINAL DETACHMENT SURGERY  2022   TONSILLECTOMY  2003   Patient Active Problem List   Diagnosis Date Noted   Constipation 03/18/2023   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   OSA (obstructive sleep apnea) 03/06/2023   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   Intractable nausea and vomiting 02/28/2023   Acute renal failure superimposed on stage 3a chronic kidney disease (HCC) 02/27/2023   DM (diabetes mellitus), 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    Essential hypertension 04/23/2021   Necrotizing fasciitis (HCC) 04/22/2021    ONSET DATE: 02/09/23   REFERRING DIAG: cerebellar stroke  THERAPY DIAG:  Muscle weakness (generalized)  Decreased activity tolerance  Unsteadiness on feet  Other abnormalities of gait and mobility  Cerebellar stroke (HCC)  Rationale for Evaluation and Treatment: Rehabilitation  SUBJECTIVE:                                                                                                                                                                                             SUBJECTIVE STATEMENT: Patient reports that he was very  fatigued after the last PT session, reports took a nap and was tired the next day Pt accompanied by: self  PERTINENT HISTORY: cerebellar stroke also renal disease, hospitalized 8/3/to 9/9/224 for rehab.  Referred to outpt PT  PAIN:  Are you having pain? No  PRECAUTIONS: Other: BP  RED FLAGS: None   WEIGHT BEARING RESTRICTIONS: No  FALLS: Has patient fallen in last 6 months? Yes. Number of falls 4  LIVING ENVIRONMENT: Lives with: lives with their family Lives in: House/apartment Has following equipment at home: Single point cane and Environmental consultant - 4 wheeled  PLOF: Independent  PATIENT GOALS: return to I driving, walking, managing my food truck  OBJECTIVE:  Note: Objective measures were completed at Evaluation unless otherwise noted.  DIAGNOSTIC FINDINGS:copied from hospitalist notes: MRI brain done for workup showing small acute infarct in lateral right medulla and deep right cerebral white matter adjacent to lateral ventricle.   COGNITION: Overall cognitive status: Within functional limits for tasks assessed   SENSATION: WFL  COORDINATION: Alt toe taps, symmetrical toe taps in sitting wnl  EDEMA:  None noted  MUSCLE TONE: all wnl    LOWER EXTREMITY ROM:   all LE ROM wnl  LOWER EXTREMITY MMT:  burst testing B LE 's wnl  BED MOBILITY: NA  today   TRANSFERS: Assistive device utilized: Environmental consultant - 4 wheeled  Sit to stand: Modified independence Stand to sit: Modified independence   GAIT: Gait pattern: lateral lean- Right and wide BOS Distance walked: 80 Assistive device utilized: Walker - 4 wheeled Level of assistance: Modified independence   Monitored BO in lobby,BP 166/106 After walking 40' lobby to clinic: BP 187/115 After walking 80' BP 196/117 Stopped eval at this pt and called PCP to report he was advised to go to urgent care FUNCTIONAL TESTS:  FGA 16/30  PATIENT SURVEYS:    TODAY'S TREATMENT:                                                                                                                              DATE:  05/15/23 Gait outside today used the rollator, no rest, around the back building and to the front doot Leg extension 10# 2x10 Leg curls 25# 2x10 On airex 10# straight arm pulls On airex 5# chest press 70# leg press 2 x10 Lats 25# 2x10 5# hip extension and abduction very weak and very tired with the abduction had to have rest breaks 35# triceps 10# biceps  05/13/23 Gait outside with SBA without device around the back building with no rest, good pace, SOB and some wobbly legs at the end 10# straight arm extension 2x10 Leg press 70# 3x10 Resisted walking 50# all directions SLS  Gait outside no device SBA around the parking island in the back BP after this 172/94,, after 2 minutes rest 174/95  05/09/23 Nustep level 5 x 5 minutes Gait outside around the back parking [name] and then to the front door with Rollator and  SBA 35# leg curls 2x10 Leg extension 10# 2x10 Leg press 70# 2x10 Calf press 70# 2x10 In pbars side stepping on airex balance beam, then tandem walk On airex ball toss On airex eyes closed On bosu upside down balance Sit to stand weighted ball 6.6# overhead reach Walking ball toss  05/08/23 Bike L3.5 x 4 minutes, 30 sec fast pedalling, RHR/SATS 93/97, EHR/SATS  98/97 Squats with 6# ball x 10 Side lunge with 6# ball x 10 each way to challenge both strength and balance Patient developed R piriformis spasm Lower and upper body stretching to relieve spasm and improve his sleep-lower traps stretch, figure 4 in sit, glut stretch in sit, HS stretch, deep STM with tennis ball Educated to deep breathing techniques to improve his breathing and promote improved relaxation before sleep.  04/29/23:  Completed FGA: 16/30 Mini jumps B LE's 30 sec in ll bars Jogging in ll bars 30 sec  Large step with reaches across trunk to target on wall behind  Dribbling 65 cm green physioball x 40' L hand, 40' R hand Wall ladder climbs 15 x each side, added horizontal head turns Corner for semitandem standing for horizontal head turns. Leg press 70# B LE's  Leg press 70# B ankle plantarflexors  04/24/23   PATIENT EDUCATION: Education details: POC, goals Person educated: Patient Education method: Explanation, Demonstration, Tactile cues, and Verbal cues Education comprehension: verbalized understanding  HOME EXERCISE PROGRAM:  [id number]  GOALS: Goals reviewed with patient? Yes  SHORT TERM GOALS: Target date: 2 weeks 05/08/23  IHEP  Baseline:TBD Goal status: 05/08/23 Provided, ongoing   LONG TERM GOALS: Target date: 07/17/23  Able to complete FGA and score 30/30 Baseline: TBA 04/29/23: FGA 16/30 Goal status: INITIAL  2.  Able to walk greater than 1000' maintaining pace of 1 m/sec for consistent and efficient community ambulation Baseline: progressing 05/15/23 Goal status: progressing  3.  Sit to stand 30 sec, 15 sec or more to demonstrate endurance, coordination, functional strength Baseline: TBD Goal status: INITIAL  ASSESSMENT:  CLINICAL IMPRESSION: Patient again fatigues with walking, very weak with the hip abduction, needed rest breaks to complete.  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   ACTIVITY LIMITATIONS: carrying, lifting, standing, squatting, stairs, locomotion level, and caring for others  PARTICIPATION LIMITATIONS: meal prep, cleaning, laundry, driving, occupation, and yard work  PERSONAL FACTORS: Behavior pattern, Past/current experiences, and Time since onset of injury/illness/exacerbation are also affecting patient's functional outcome.   REHAB POTENTIAL: Good  CLINICAL DECISION MAKING: Evolving/moderate complexity  EVALUATION COMPLEXITY: Moderate  PLAN:  PT FREQUENCY: 2x/week  PT DURATION: 12 weeks  PLANNED INTERVENTIONS: 97110-Therapeutic exercises, 97530- Therapeutic activity, [id number]- Neuromuscular re-education, 97535- Self Care, and 25427- Manual therapy  PLAN FOR NEXT SESSION:continue to progress strength and function  Morris Markham W, PT 05/15/2023, 1:27 PM

## 2023-05-22 ENCOUNTER — Ambulatory Visit: Payer: Medicaid Other | Admitting: Physical Therapy

## 2023-05-22 ENCOUNTER — Encounter: Payer: Self-pay | Admitting: Physical Therapy

## 2023-05-22 DIAGNOSIS — M6281 Muscle weakness (generalized): Secondary | ICD-10-CM | POA: Diagnosis not present

## 2023-05-22 DIAGNOSIS — I639 Cerebral infarction, unspecified: Secondary | ICD-10-CM

## 2023-05-22 DIAGNOSIS — R6889 Other general symptoms and signs: Secondary | ICD-10-CM

## 2023-05-22 DIAGNOSIS — R2689 Other abnormalities of gait and mobility: Secondary | ICD-10-CM

## 2023-05-22 DIAGNOSIS — R2681 Unsteadiness on feet: Secondary | ICD-10-CM

## 2023-05-22 DIAGNOSIS — R278 Other lack of coordination: Secondary | ICD-10-CM

## 2023-05-22 NOTE — Therapy (Signed)
OUTPATIENT PHYSICAL THERAPY NEURO TREATMENT   Patient Name: Bradley MELEAR MRN: [id number] DOB:29-Aug-1985, 37 y.o., male Today's Date: 05/22/2023  PCP: Darnelle Going REFERRING PROVIDER: Delle Reining, PA  END OF SESSION:  PT End of Session - 05/22/23 1551     Visit Number 7    Date for PT Re-Evaluation 07/17/23    PT Start Time 1546    PT Stop Time 1625    PT Time Calculation (min) 39 min    Activity Tolerance Patient tolerated treatment well    Behavior During Therapy [facility] for tasks assessed/performed            Past Medical History:  Diagnosis Date   CKD stage 3a, GFR 45-59 ml/min (HCC)    Diabetes mellitus    Hypertension    Necrotizing fasciitis (HCC) 04/2021   right groin   Stroke [facility])    Past Surgical History:  Procedure Laterality Date   INCISION AND DRAINAGE ABSCESS Right 04/21/2021   Procedure: INCISION, DRAINAGE, DEBRIDEMENT OF RIGHT THIGH/GROIN INFECTION;  Surgeon: Abigail Miyamoto, MD;  Location: [facility];  Service: General;  Laterality: Right;   IRRIGATION AND DEBRIDEMENT ABSCESS Right 04/23/2021   Procedure: IRRIGATION AND DEBRIDEMENT RIGHT GROIN WOUND; RIGHT THIGH DRESSING CHANGE;  Surgeon: Abigail Miyamoto, MD;  Location: [facility];  Service: General;  Laterality: Right;   IRRIGATION AND DEBRIDEMENT ABSCESS Right 04/26/2021   Procedure: reexploration and debridement of soft tissue infection right groin;  Surgeon: Emelia Loron, MD;  Location: [facility];  Service: General;  Laterality: Right;   RETINAL DETACHMENT SURGERY  2022   TONSILLECTOMY  2003   Patient Active Problem List   Diagnosis Date Noted   Constipation 03/18/2023   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   OSA (obstructive sleep apnea) 03/06/2023   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   Intractable nausea and vomiting 02/28/2023   Acute renal failure superimposed on stage 3a chronic kidney disease (HCC) 02/27/2023   DM (diabetes mellitus), 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    Essential hypertension 04/23/2021   Necrotizing fasciitis (HCC) 04/22/2021    ONSET DATE: 02/09/23   REFERRING DIAG: cerebellar stroke  THERAPY DIAG:  Muscle weakness (generalized)  Decreased activity tolerance  Unsteadiness on feet  Other abnormalities of gait and mobility  Cerebellar stroke (HCC)  Other lack of coordination  Rationale for Evaluation and Treatment: Rehabilitation  SUBJECTIVE:                                                                                                                                                                                             SUBJECTIVE STATEMENT: Patient reports that he  is still having trouble managing his work hours. He is having a lot of difficulty sleeping.  Pt accompanied by: self  PERTINENT HISTORY: cerebellar stroke also renal disease, hospitalized 8/3/to 9/9/224 for rehab.  Referred to outpt PT  PAIN:  Are you having pain? No  PRECAUTIONS: Other: BP  RED FLAGS: None   WEIGHT BEARING RESTRICTIONS: No  FALLS: Has patient fallen in last 6 months? Yes. Number of falls 4  LIVING ENVIRONMENT: Lives with: lives with their family Lives in: House/apartment Has following equipment at home: Single point cane and Environmental consultant - 4 wheeled  PLOF: Independent  PATIENT GOALS: return to I driving, walking, managing my food truck  OBJECTIVE:  Note: Objective measures were completed at Evaluation unless otherwise noted.  DIAGNOSTIC FINDINGS:copied from hospitalist notes: MRI brain done for workup showing small acute infarct in lateral right medulla and deep right cerebral white matter adjacent to lateral ventricle.   COGNITION: Overall cognitive status: Within functional limits for tasks assessed   SENSATION: WFL  COORDINATION: Alt toe taps, symmetrical toe taps in sitting wnl  EDEMA:  None noted  MUSCLE TONE: all wnl    LOWER EXTREMITY ROM:   all LE ROM wnl  LOWER EXTREMITY MMT:  burst testing B LE 's  wnl  BED MOBILITY: NA today   TRANSFERS: Assistive device utilized: Environmental consultant - 4 wheeled  Sit to stand: Modified independence Stand to sit: Modified independence   GAIT: Gait pattern: lateral lean- Right and wide BOS Distance walked: 80 Assistive device utilized: Walker - 4 wheeled Level of assistance: Modified independence   Monitored BO in lobby,BP 166/106 After walking 40' lobby to clinic: BP 187/115 After walking 80' BP 196/117 Stopped eval at this pt and called PCP to report he was advised to go to urgent care FUNCTIONAL TESTS:  FGA 16/30  PATIENT SURVEYS:    TODAY'S TREATMENT:                                                                                                                              DATE:  05/22/23 NuStep L5 x 6 minutes. Squats with 10# weight and G tband at knees to engage abductors, 2 x 10 reps Standing in parallel bars, hip abd pushes on Fitter press, 1 black band, 2 x 10 each leg, min VC for correct form Quadruped Bird dogs x 5 each, cat/cow Supine breathing exercises- deep breaths with TC of hands on ribs to fully open ribcage, and then resisted breathing for rib mobs. Patient educated in deep breathing techniques at home to facilitate improved sleep  05/15/23 Gait outside today used the rollator, no rest, around the back building and to the front doot Leg extension 10# 2x10 Leg curls 25# 2x10 On airex 10# straight arm pulls On airex 5# chest press 70# leg press 2 x10 Lats 25# 2x10 5# hip extension and abduction very weak and very tired with the abduction had to have rest breaks 35# triceps  10# biceps  05/13/23 Gait outside with SBA without device around the back building with no rest, good pace, SOB and some wobbly legs at the end 10# straight arm extension 2x10 Leg press 70# 3x10 Resisted walking 50# all directions SLS  Gait outside no device SBA around the parking island in the back BP after this 172/94,, after 2 minutes rest  174/95  05/09/23 Nustep level 5 x 5 minutes Gait outside around the back parking [name] and then to the front door with Rollator and SBA 35# leg curls 2x10 Leg extension 10# 2x10 Leg press 70# 2x10 Calf press 70# 2x10 In pbars side stepping on airex balance beam, then tandem walk On airex ball toss On airex eyes closed On bosu upside down balance Sit to stand weighted ball 6.6# overhead reach Walking ball toss  05/08/23 Bike L3.5 x 4 minutes, 30 sec fast pedalling, RHR/SATS 93/97, EHR/SATS 98/97 Squats with 6# ball x 10 Side lunge with 6# ball x 10 each way to challenge both strength and balance Patient developed R piriformis spasm Lower and upper body stretching to relieve spasm and improve his sleep-lower traps stretch, figure 4 in sit, glut stretch in sit, HS stretch, deep STM with tennis ball Educated to deep breathing techniques to improve his breathing and promote improved relaxation before sleep.  04/29/23:  Completed FGA: 16/30 Mini jumps B LE's 30 sec in ll bars Jogging in ll bars 30 sec  Large step with reaches across trunk to target on wall behind  Dribbling 65 cm green physioball x 40' L hand, 40' R hand Wall ladder climbs 15 x each side, added horizontal head turns Corner for semitandem standing for horizontal head turns. Leg press 70# B LE's  Leg press 70# B ankle plantarflexors  04/24/23   PATIENT EDUCATION: Education details: POC, goals Person educated: Patient Education method: Explanation, Demonstration, Tactile cues, and Verbal cues Education comprehension: verbalized understanding  HOME EXERCISE PROGRAM:  [id number]  GOALS: Goals reviewed with patient? Yes  SHORT TERM GOALS: Target date: 2 weeks 05/08/23  IHEP  Baseline:TBD Goal status: 05/08/23 Provided, ongoing   LONG TERM GOALS: Target date: 07/17/23  Able to complete FGA and score 30/30 Baseline: TBA 04/29/23: FGA 16/30 Goal status: INITIAL  2.  Able to walk greater than 1000'  maintaining pace of 1 m/sec for consistent and efficient community ambulation Baseline: progressing 05/15/23 Goal status: progressing  3.  Sit to stand 30 sec, 15 sec or more to demonstrate endurance, coordination, functional strength Baseline: TBD Goal status: 05/22/23-ongoing  ASSESSMENT:  CLINICAL IMPRESSION: Patient reports difficulty sleeping which is impeding his activity. He reports that he feels he is not breathing deeply. Also, awakens at night and can't get back to sleep. He had been taking sleeping meds, but has not started back since his stroke. Encouraged to talk with Dr before adding them back as they were not listed in his meds at the time of the stroke. Treatment focused on functional strengthening to improve his activity tolerance at work, as well as deep breathing activities to facilitate improved oxygenation and also possible help him in sleep. HEP updated with some functional strengthening exercises he can perform at home as he reports he is reluctant to return to gym yet.   OBJECTIVE IMPAIRMENTS: Abnormal gait, cardiopulmonary status limiting activity, decreased balance, and decreased coordination.   ACTIVITY LIMITATIONS: carrying, lifting, standing, squatting, stairs, locomotion level, and caring for others  PARTICIPATION LIMITATIONS: meal prep, cleaning, laundry, driving, occupation, and yard work  PERSONAL FACTORS: Behavior pattern, Past/current experiences, and Time since onset of injury/illness/exacerbation are also affecting patient's functional outcome.   REHAB POTENTIAL: Good  CLINICAL DECISION MAKING: Evolving/moderate complexity  EVALUATION COMPLEXITY: Moderate  PLAN:  PT FREQUENCY: 2x/week  PT DURATION: 12 weeks  PLANNED INTERVENTIONS: 97110-Therapeutic exercises, 97530- Therapeutic activity, [id number]- Neuromuscular re-education, 97535- Self Care, and 16109- Manual therapy  PLAN FOR NEXT SESSION:continue to progress strength and function  Iona Beard, DPT 05/22/2023, 4:36 PM

## 2023-05-27 ENCOUNTER — Other Ambulatory Visit: Payer: Self-pay

## 2023-05-27 ENCOUNTER — Ambulatory Visit: Payer: Medicaid Other

## 2023-05-27 DIAGNOSIS — R2689 Other abnormalities of gait and mobility: Secondary | ICD-10-CM

## 2023-05-27 DIAGNOSIS — R278 Other lack of coordination: Secondary | ICD-10-CM

## 2023-05-27 DIAGNOSIS — R6889 Other general symptoms and signs: Secondary | ICD-10-CM

## 2023-05-27 DIAGNOSIS — I639 Cerebral infarction, unspecified: Secondary | ICD-10-CM

## 2023-05-27 DIAGNOSIS — M6281 Muscle weakness (generalized): Secondary | ICD-10-CM

## 2023-05-27 DIAGNOSIS — R2681 Unsteadiness on feet: Secondary | ICD-10-CM

## 2023-05-27 NOTE — Therapy (Signed)
OUTPATIENT PHYSICAL THERAPY NEURO TREATMENT   Patient Name: Bradley Davis MRN: [id number] DOB:08/15/1985, 37 y.o., male Today's Date: 05/27/2023  PCP: Darnelle Going REFERRING PROVIDER: Delle Reining, PA  END OF SESSION:  PT End of Session - 05/27/23 1304     Visit Number 8    Date for PT Re-Evaluation 07/17/23    Progress Note Due on Visit 10    PT Start Time 1304    PT Stop Time 1345    PT Time Calculation (min) 41 min    Activity Tolerance Patient tolerated treatment well    Behavior During Therapy [facility] for tasks assessed/performed             Past Medical History:  Diagnosis Date   CKD stage 3a, GFR 45-59 ml/min (HCC)    Diabetes mellitus    Hypertension    Necrotizing fasciitis (HCC) 04/2021   right groin   Stroke [facility])    Past Surgical History:  Procedure Laterality Date   INCISION AND DRAINAGE ABSCESS Right 04/21/2021   Procedure: INCISION, DRAINAGE, DEBRIDEMENT OF RIGHT THIGH/GROIN INFECTION;  Surgeon: Abigail Miyamoto, MD;  Location: [facility];  Service: General;  Laterality: Right;   IRRIGATION AND DEBRIDEMENT ABSCESS Right 04/23/2021   Procedure: IRRIGATION AND DEBRIDEMENT RIGHT GROIN WOUND; RIGHT THIGH DRESSING CHANGE;  Surgeon: Abigail Miyamoto, MD;  Location: [facility];  Service: General;  Laterality: Right;   IRRIGATION AND DEBRIDEMENT ABSCESS Right 04/26/2021   Procedure: reexploration and debridement of soft tissue infection right groin;  Surgeon: Emelia Loron, MD;  Location: [facility];  Service: General;  Laterality: Right;   RETINAL DETACHMENT SURGERY  2022   TONSILLECTOMY  2003   Patient Active Problem List   Diagnosis Date Noted   Constipation 03/18/2023   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   OSA (obstructive sleep apnea) 03/06/2023   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   Intractable nausea and vomiting 02/28/2023   Acute renal failure superimposed on stage 3a chronic kidney disease (HCC) 02/27/2023   DM (diabetes mellitus), type 1 with renal  complications (HCC) 04/23/2021   Essential hypertension 04/23/2021   Necrotizing fasciitis (HCC) 04/22/2021    ONSET DATE: 02/09/23   REFERRING DIAG: cerebellar stroke  THERAPY DIAG:  Muscle weakness (generalized)  Decreased activity tolerance  Unsteadiness on feet  Other abnormalities of gait and mobility  Cerebellar stroke (HCC)  Other lack of coordination  Rationale for Evaluation and Treatment: Rehabilitation  SUBJECTIVE:  SUBJECTIVE STATEMENT: Patient reports that he fell this weekend, walked into a tree branch and fell on trailor on L side , landed on his L ribs , really sore.   Pt accompanied by: self  PERTINENT HISTORY: cerebellar stroke also renal disease, hospitalized 8/3/to 9/9/224 for rehab.  Referred to outpt PT  PAIN:  Are you having pain? No  PRECAUTIONS: Other: BP  RED FLAGS: None   WEIGHT BEARING RESTRICTIONS: No  FALLS: Has patient fallen in last 6 months? Yes. Number of falls 4  LIVING ENVIRONMENT: Lives with: lives with their family Lives in: House/apartment Has following equipment at home: Single point cane and Environmental consultant - 4 wheeled  PLOF: Independent  PATIENT GOALS: return to I driving, walking, managing my food truck  OBJECTIVE:  Note: Objective measures were completed at Evaluation unless otherwise noted.  DIAGNOSTIC FINDINGS:copied from hospitalist notes: MRI brain done for workup showing small acute infarct in lateral right medulla and deep right cerebral white matter adjacent to lateral ventricle.   COGNITION: Overall cognitive status: Within functional limits for tasks assessed   SENSATION: WFL  COORDINATION: Alt toe taps, symmetrical toe taps in sitting wnl  EDEMA:  None noted  MUSCLE TONE: all wnl    LOWER EXTREMITY ROM:   all LE ROM  wnl  LOWER EXTREMITY MMT:  burst testing B LE 's wnl  BED MOBILITY: NA today   TRANSFERS: Assistive device utilized: Environmental consultant - 4 wheeled  Sit to stand: Modified independence Stand to sit: Modified independence   GAIT: Gait pattern: lateral lean- Right and wide BOS Distance walked: 80 Assistive device utilized: Walker - 4 wheeled Level of assistance: Modified independence   Monitored BO in lobby,BP 166/106 After walking 40' lobby to clinic: BP 187/115 After walking 80' BP 196/117 Stopped eval at this pt and called PCP to report he was advised to go to urgent care FUNCTIONAL TESTS:  FGA 16/30  PATIENT SURVEYS:    TODAY'S TREATMENT:                                                                                                                              DATE:  05/27/23: Quadriped alt arm and leg lifts 10 x each Squats with 10# with black t band around thighs for cue for hip abductors, 10 # Leg press B 70#, 3 x 10  Hamstring curls, 35# 3 x 10 Knee extension 10# 3 x 10 Standing hip abduction 3# cuff weight 15x Standing hip ext 3# cuff wt 15x Fast travelling laterally over airex, with rollator for balance, fast cadence Alt forward step ups onto airex with opposite knee drivers, 15 x each, rollator for balance, fast cadence   05/22/23 NuStep L5 x 6 minutes. Squats with 10# weight and G tband at knees to engage abductors, 2 x 10 reps Standing in parallel bars, hip abd pushes on Fitter press, 1 black band, 2 x 10 each leg, min VC for correct form Quadruped  Bird dogs x 5 each, cat/cow Supine breathing exercises- deep breaths with TC of hands on ribs to fully open ribcage, and then resisted breathing for rib mobs. Patient educated in deep breathing techniques at home to facilitate improved sleep  05/15/23 Gait outside today used the rollator, no rest, around the back building and to the front doot Leg extension 10# 2x10 Leg curls 25# 2x10 On airex 10# straight arm  pulls On airex 5# chest press 70# leg press 2 x10 Lats 25# 2x10 5# hip extension and abduction very weak and very tired with the abduction had to have rest breaks 35# triceps 10# biceps  05/13/23 Gait outside with SBA without device around the back building with no rest, good pace, SOB and some wobbly legs at the end 10# straight arm extension 2x10 Leg press 70# 3x10 Resisted walking 50# all directions SLS  Gait outside no device SBA around the parking island in the back BP after this 172/94,, after 2 minutes rest 174/95  05/09/23 Nustep level 5 x 5 minutes Gait outside around the back parking [name] and then to the front door with Rollator and SBA 35# leg curls 2x10 Leg extension 10# 2x10 Leg press 70# 2x10 Calf press 70# 2x10 In pbars side stepping on airex balance beam, then tandem walk On airex ball toss On airex eyes closed On bosu upside down balance Sit to stand weighted ball 6.6# overhead reach Walking ball toss  05/08/23 Bike L3.5 x 4 minutes, 30 sec fast pedalling, RHR/SATS 93/97, EHR/SATS 98/97 Squats with 6# ball x 10 Side lunge with 6# ball x 10 each way to challenge both strength and balance Patient developed R piriformis spasm Lower and upper body stretching to relieve spasm and improve his sleep-lower traps stretch, figure 4 in sit, glut stretch in sit, HS stretch, deep STM with tennis ball Educated to deep breathing techniques to improve his breathing and promote improved relaxation before sleep.  04/29/23:  Completed FGA: 16/30 Mini jumps B LE's 30 sec in ll bars Jogging in ll bars 30 sec  Large step with reaches across trunk to target on wall behind  Dribbling 65 cm green physioball x 40' L hand, 40' R hand Wall ladder climbs 15 x each side, added horizontal head turns Corner for semitandem standing for horizontal head turns. Leg press 70# B LE's  Leg press 70# B ankle plantarflexors  04/24/23   PATIENT EDUCATION: Education details: POC,  goals Person educated: Patient Education method: Explanation, Demonstration, Tactile cues, and Verbal cues Education comprehension: verbalized understanding  HOME EXERCISE PROGRAM:  [id number]  GOALS: Goals reviewed with patient? Yes  SHORT TERM GOALS: Target date: 2 weeks 05/08/23  IHEP  Baseline:TBD Goal status: 05/08/23 Provided, ongoing   LONG TERM GOALS: Target date: 07/17/23  Able to complete FGA and score 30/30 Baseline: TBA 04/29/23: FGA 16/30 Goal status: INITIAL  2.  Able to walk greater than 1000' maintaining pace of 1 m/sec for consistent and efficient community ambulation Baseline: progressing 05/15/23 Goal status: progressing  3.  Sit to stand 30 sec, 15 sec or more to demonstrate endurance, coordination, functional strength Baseline: TBD Goal status: 05/22/23-ongoing  ASSESSMENT:  CLINICAL IMPRESSION: Larey Seat due to branch hitting him this weekend and landed on L ribs.  Advised him to seek medical attention if increased SOB , or difficulty breathing.  Did not have him utilize UE strengthening due to pain L ribs.  Able to move more quickly with better coordination and speed at end of session  as compared to previous sessions.  Will benefit from ongoing skilled PT to address his recovery.  He will follow up in the next couple of weeks with neurologist. OBJECTIVE IMPAIRMENTS: Abnormal gait, cardiopulmonary status limiting activity, decreased balance, and decreased coordination.   ACTIVITY LIMITATIONS: carrying, lifting, standing, squatting, stairs, locomotion level, and caring for others  PARTICIPATION LIMITATIONS: meal prep, cleaning, laundry, driving, occupation, and yard work  PERSONAL FACTORS: Behavior pattern, Past/current experiences, and Time since onset of injury/illness/exacerbation are also affecting patient's functional outcome.   REHAB POTENTIAL: Good  CLINICAL DECISION MAKING: Evolving/moderate complexity  EVALUATION COMPLEXITY: Moderate  PLAN:  PT  FREQUENCY: 2x/week  PT DURATION: 12 weeks  PLANNED INTERVENTIONS: 97110-Therapeutic exercises, 97530- Therapeutic activity, [id number]- Neuromuscular re-education, 97535- Self Care, and 13244- Manual therapy  PLAN FOR NEXT SESSION:continue to progress strength and function  Alexys Gassett, , PT, DPT, OCS 05/27/2023, 4:17 PM

## 2023-05-28 ENCOUNTER — Other Ambulatory Visit: Payer: Self-pay

## 2023-05-28 ENCOUNTER — Encounter (HOSPITAL_BASED_OUTPATIENT_CLINIC_OR_DEPARTMENT_OTHER): Payer: Self-pay

## 2023-05-28 ENCOUNTER — Emergency Department (HOSPITAL_BASED_OUTPATIENT_CLINIC_OR_DEPARTMENT_OTHER)
Admission: EM | Admit: 2023-05-28 | Discharge: 2023-05-28 | Disposition: A | Discharge status: Home / Self Care | Payer: Medicaid Other | Attending: Emergency Medicine | Admitting: Emergency Medicine

## 2023-05-28 ENCOUNTER — Emergency Department (HOSPITAL_BASED_OUTPATIENT_CLINIC_OR_DEPARTMENT_OTHER): Payer: Medicaid Other

## 2023-05-28 DIAGNOSIS — R0602 Shortness of breath: Secondary | ICD-10-CM | POA: Diagnosis present

## 2023-05-28 DIAGNOSIS — J181 Lobar pneumonia, unspecified organism: Secondary | ICD-10-CM | POA: Insufficient documentation

## 2023-05-28 DIAGNOSIS — I10 Essential (primary) hypertension: Secondary | ICD-10-CM | POA: Insufficient documentation

## 2023-05-28 DIAGNOSIS — Z1152 Encounter for screening for COVID-19: Secondary | ICD-10-CM | POA: Insufficient documentation

## 2023-05-28 DIAGNOSIS — J189 Pneumonia, unspecified organism: Secondary | ICD-10-CM

## 2023-05-28 DIAGNOSIS — E119 Type 2 diabetes mellitus without complications: Secondary | ICD-10-CM | POA: Diagnosis not present

## 2023-05-28 LAB — CBC WITH DIFFERENTIAL/PLATELET
Abs Immature Granulocytes: 0.02 10*3/uL (ref 0.00–0.07)
Basophils Absolute: 0.1 10*3/uL (ref 0.0–0.1)
Basophils Relative: 0 %
Eosinophils Absolute: 0.5 10*3/uL (ref 0.0–0.5)
Eosinophils Relative: 4 %
HCT: 33.6 % — ABNORMAL LOW (ref 39.0–52.0)
Hemoglobin: 11.2 g/dL — ABNORMAL LOW (ref 13.0–17.0)
Immature Granulocytes: 0 %
Lymphocytes Relative: 18 %
Lymphs Abs: 2.1 10*3/uL (ref 0.7–4.0)
MCH: 27.6 pg (ref 26.0–34.0)
MCHC: 33.3 g/dL (ref 30.0–36.0)
MCV: 82.8 fL (ref 80.0–100.0)
Monocytes Absolute: 0.8 10*3/uL (ref 0.1–1.0)
Monocytes Relative: 7 %
Neutro Abs: 8.4 10*3/uL — ABNORMAL HIGH (ref 1.7–7.7)
Neutrophils Relative %: 71 %
Platelets: 393 10*3/uL (ref 150–400)
RBC: 4.06 MIL/uL — ABNORMAL LOW (ref 4.22–5.81)
RDW: 14.1 % (ref 11.5–15.5)
WBC: 11.9 10*3/uL — ABNORMAL HIGH (ref 4.0–10.5)
nRBC: 0 % (ref 0.0–0.2)

## 2023-05-28 LAB — RESP PANEL BY RT-PCR (RSV, FLU A&B, COVID)  RVPGX2
Influenza A by PCR: NEGATIVE
Influenza B by PCR: NEGATIVE
Resp Syncytial Virus by PCR: NEGATIVE
SARS Coronavirus 2 by RT PCR: NEGATIVE

## 2023-05-28 LAB — BASIC METABOLIC PANEL
Anion gap: 8 (ref 5–15)
BUN: 29 mg/dL — ABNORMAL HIGH (ref 6–20)
CO2: 21 mmol/L — ABNORMAL LOW (ref 22–32)
Calcium: 8.3 mg/dL — ABNORMAL LOW (ref 8.9–10.3)
Chloride: 109 mmol/L (ref 98–111)
Creatinine, Ser: 3.36 mg/dL — ABNORMAL HIGH (ref 0.61–1.24)
GFR, Estimated: 23 mL/min — ABNORMAL LOW (ref >60)
Glucose, Bld: 112 mg/dL — ABNORMAL HIGH (ref 70–99)
Potassium: 4 mmol/L (ref 3.5–5.1)
Sodium: 138 mmol/L (ref 135–145)

## 2023-05-28 MED ORDER — ONDANSETRON HCL 4 MG/2ML IJ SOLN
4.0000 mg | Freq: Once | INTRAMUSCULAR | Status: AC
  Administered 2023-05-28: 4 mg via INTRAVENOUS
  Filled 2023-05-28: qty 2

## 2023-05-28 MED ORDER — ALBUTEROL SULFATE HFA 108 (90 BASE) MCG/ACT IN AERS: 2.0000 | INHALATION_SPRAY | RESPIRATORY_TRACT | Status: DC | PRN

## 2023-05-28 MED ORDER — SODIUM CHLORIDE 0.9 % IV SOLN
500.0000 mg | Freq: Once | INTRAVENOUS | Status: AC
  Administered 2023-05-28: 500 mg via INTRAVENOUS
  Filled 2023-05-28: qty 5

## 2023-05-28 MED ORDER — SODIUM CHLORIDE 0.9 % IV SOLN
1.0000 g | Freq: Once | INTRAVENOUS | Status: AC
  Administered 2023-05-28: 1 g via INTRAVENOUS
  Filled 2023-05-28: qty 10

## 2023-05-28 MED ORDER — AMOXICILLIN 500 MG PO CAPS: 1000.0000 mg | ORAL_CAPSULE | Freq: Two times a day (BID) | ORAL | 0 refills | Status: AC

## 2023-05-28 MED ORDER — LACTATED RINGERS IV BOLUS
1000.0000 mL | Freq: Once | INTRAVENOUS | Status: AC
  Administered 2023-05-28: 1000 mL via INTRAVENOUS

## 2023-05-28 MED ORDER — AZITHROMYCIN 250 MG PO TABS: 250.0000 mg | ORAL_TABLET | Freq: Every day | ORAL | 0 refills | Status: DC

## 2023-05-28 NOTE — ED Triage Notes (Addendum)
Pt reports that he has had congestion for the past few days and that this morning he noticed some SOB with it. Reports right ear pain also.  Pt states that he also fell a few days ago and has left rib pain.

## 2023-05-28 NOTE — ED Provider Notes (Signed)
[place] [facility] Provider Note   CSN: [id number] Arrival date & time: 05/28/23  1543     History Chief Complaint  Patient presents with   Shortness of Breath    HPI Bradley Davis is a 37 y.o. male presenting for 3 days of fever cough congestion with acute worsening this morning. History of stroke, diabetes, hypertension.  Uses a walker for ambulation at baseline..  Coughing up thick mucus today worsening throughout the day today feels very dyspneic at this time. Patient's recorded medical, surgical, social, medication list and allergies were reviewed in the Snapshot window as part of the initial history.   Review of Systems   Review of Systems  Constitutional:  Positive for fatigue and fever. Negative for chills.  HENT:  Negative for ear pain and sore throat.   Eyes:  Negative for pain and visual disturbance.  Respiratory:  Positive for cough and shortness of breath.   Cardiovascular:  Negative for chest pain and palpitations.  Gastrointestinal:  Negative for abdominal pain and vomiting.  Genitourinary:  Negative for dysuria and hematuria.  Musculoskeletal:  Negative for arthralgias and back pain.  Skin:  Negative for color change and rash.  Neurological:  Negative for seizures and syncope.  All other systems reviewed and are negative.   Physical Exam Updated Vital Signs BP 133/76   Pulse 93   Temp 98.1 F (36.7 C) (Oral)   Resp 20   Ht 5\' 10"  (1.778 m)   Wt 113.4 kg   SpO2 93%   BMI 35.87 kg/m  Physical Exam Vitals and nursing note reviewed.  Constitutional:      General: He is not in acute distress.    Appearance: He is well-developed.  HENT:     Head: Normocephalic and atraumatic.  Eyes:     Conjunctiva/sclera: Conjunctivae normal.  Cardiovascular:     Rate and Rhythm: Normal rate and regular rhythm.     Heart sounds: No murmur heard. Pulmonary:     Effort: Pulmonary effort is normal. No respiratory distress.      Breath sounds: Rhonchi present.  Abdominal:     Palpations: Abdomen is soft.     Tenderness: There is no abdominal tenderness.  Musculoskeletal:        General: No swelling.     Cervical back: Neck supple.  Skin:    General: Skin is warm and dry.     Capillary Refill: Capillary refill takes less than 2 seconds.  Neurological:     Mental Status: He is alert.  Psychiatric:        Mood and Affect: Mood normal.      ED Course/ Medical Decision Making/ A&P Clinical Course as of 05/28/23 2219  Tue May 28, 2023  1823 Sided lobar pneumonia. [CC]    Clinical Course User Index [CC] Glyn Ade, MD    Procedures Procedures   Medications Ordered in ED Medications  albuterol (VENTOLIN HFA) 108 (90 Base) MCG/ACT inhaler 2 puff (has no administration in time range)  cefTRIAXone (ROCEPHIN) 1 g in sodium chloride 0.9 % 100 mL IVPB (0 g Intravenous Stopped 05/28/23 1923)  azithromycin (ZITHROMAX) 500 mg in sodium chloride 0.9 % 250 mL IVPB (0 mg Intravenous Stopped 05/28/23 2045)  lactated ringers bolus 1,000 mL (0 mLs Intravenous Stopped 05/28/23 2045)  ondansetron (ZOFRAN) injection 4 mg (4 mg Intravenous Given 05/28/23 1951)    Medical Decision Making:    Bradley Davis is a 37 y.o. male  who presented to the ED today with fever cough shortness of breath detailed above.    Complete initial physical exam performed, notably the patient  was  Hemodynamically stable no acute distress.      Reviewed and confirmed nursing documentation for past medical history, family history, social history.    Initial Assessment:   With the patient's presentation of fever, most likely diagnosis is pneumonia. Other diagnoses were considered including (but not limited to) COVID, flu, metabolic versus. These are considered less likely due to history of present illness and physical exam findings.   This is most consistent with an acute life/limb threatening illness complicated by underlying  chronic conditions.  Initial Plan:  X-ray was done in triage and appears to demonstrate a right-sided lobar pneumonia.  No COVID or flu. Given the findings will broaden out his blood work for metabolic studies, hematologic studies and plan for reassessment. Given the first dose of antibiotics IV given his dyspnea on exertion and severe medical history. Will plan for reassessment of his condition after administration of these medications.  Initial Study Results:   Laboratory  All laboratory results reviewed without evidence of clinically relevant pathology.    EKG EKG was reviewed independently. Rate, rhythm, axis, intervals all examined and without medically relevant abnormality. ST segments without concerns for elevations.    Radiology  All images reviewed independently. Agree with radiology report at this time.   DG Chest 2 View  Result Date: 05/28/2023 CLINICAL DATA:  Cough and shortness of breath. EXAM: CHEST - 2 VIEW COMPARISON:  04/24/2023. FINDINGS: The heart size and mediastinal contours are within normal limits. Diffuse airspace disease is present in the right lung, most pronounced in the right middle lobe. The left lung is clear. There are small bilateral pleural effusions. No pneumothorax is seen. No acute osseous abnormality. IMPRESSION: 1. Diffuse airspace disease in the right lung, suggesting multifocal pneumonia. 2. Small bilateral pleural effusions. Electronically Signed   By: Thornell Sartorius M.D.   On: 05/28/2023 20:36     Reassessment and Plan:   Started therapy with Rocephin and azithromycin.  Will transition to p.o. antibiotics as patient does not appear to be in any acute distress.  Mild AKI likely from the infection but not severe.  Resuscitated with IV fluids while in the emergency room.  Patient feels comfortable with discharge given improving symptoms at this time.  Strict return precautions reinforced patient expressed understanding.   Disposition:  I have considered  need for hospitalization, however, considering all of the above, I believe this patient is stable for discharge at this time.  Patient/family educated about specific return precautions for given chief complaint and symptoms.  Patient/family educated about follow-up with PCP .     Patient/family expressed understanding of return precautions and need for follow-up. Patient spoken to regarding all imaging and laboratory results and appropriate follow up for these results. All education provided in verbal form with additional information in written form. Time was allowed for answering of patient questions. Patient discharged.    Emergency Department Medication Summary:   Medications  albuterol (VENTOLIN HFA) 108 (90 Base) MCG/ACT inhaler 2 puff (has no administration in time range)  cefTRIAXone (ROCEPHIN) 1 g in sodium chloride 0.9 % 100 mL IVPB (0 g Intravenous Stopped 05/28/23 1923)  azithromycin (ZITHROMAX) 500 mg in sodium chloride 0.9 % 250 mL IVPB (0 mg Intravenous Stopped 05/28/23 2045)  lactated ringers bolus 1,000 mL (0 mLs Intravenous Stopped 05/28/23 2045)  ondansetron (ZOFRAN) injection 4  mg (4 mg Intravenous Given 05/28/23 1951)         Clinical Impression:  1. SOB (shortness of breath)   2. Community acquired pneumonia of right lower lobe of lung      Discharge   Final Clinical Impression(s) / ED Diagnoses Final diagnoses:  SOB (shortness of breath)  Community acquired pneumonia of right lower lobe of lung    Rx / DC Orders ED Discharge Orders          Ordered    azithromycin (ZITHROMAX) 250 MG tablet  Daily        05/28/23 2218    amoxicillin (AMOXIL) 500 MG capsule  2 times daily        05/28/23 2218              Glyn Ade, MD 05/28/23 2219

## 2023-05-29 ENCOUNTER — Ambulatory Visit: Payer: Medicaid Other

## 2023-05-29 ENCOUNTER — Encounter: Payer: Self-pay | Admitting: Physical Medicine & Rehabilitation

## 2023-05-30 ENCOUNTER — Encounter: Payer: Self-pay | Admitting: Physical Medicine & Rehabilitation

## 2023-05-30 ENCOUNTER — Encounter: Payer: Medicaid Other | Attending: Registered Nurse | Admitting: Physical Medicine & Rehabilitation

## 2023-05-30 VITALS — BP 154/84 | HR 89 | Ht 70.0 in | Wt 270.0 lb

## 2023-05-30 DIAGNOSIS — I639 Cerebral infarction, unspecified: Secondary | ICD-10-CM | POA: Diagnosis not present

## 2023-05-30 DIAGNOSIS — I69398 Other sequelae of cerebral infarction: Secondary | ICD-10-CM | POA: Diagnosis present

## 2023-05-30 DIAGNOSIS — R269 Unspecified abnormalities of gait and mobility: Secondary | ICD-10-CM | POA: Diagnosis not present

## 2023-05-30 NOTE — Progress Notes (Addendum)
Subjective:    Patient ID: Bradley Davis, male    DOB: 1986-02-20, 37 y.o.   MRN: [id number]  37 y.o. LH-male with history of T1DM, CKD 3A, HTN who was admitted on 02/27/2023 with 2-day history of nausea vomiting progressing to dizziness with standing at times.  He was found to have acute on chronic renal failure with serum creatinine up to 3.6 and was treated with fluid boluses.  He was also started on bowel regimen due to his evidence of constipation.  Therapy was working with patient who continued to have issues with nausea as well as decreased sensation RLE staggering gait as well as orthostatic changes.     MRI brain done for workup showing small acute infarct in lateral right medulla and deep right cerebral white matter adjacent to lateral ventricle.  2D echo showed EF 55 to 60% and was negative for shunt.  Renal ultrasound showed normal kidneys.  Dr. Pearlean Brownie felt that stroke was due to small vessel disease and recommended DAPT x 3 weeks followed by aspirin alone.  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  He completed inpatient rehabilitation and has ongoing outpatient rehabilitation.  He states his last visits are in December.  He previously worked as a Investment banker, operational on a food truck.  He has not worked since his stroke.  He fell while cleaning his food truck outdoors.  A branch hit him in the eye and he fell over.  Since his last physical medicine rehabilitation visit he also underwent treatment for pneumonia in the emergency department 05/28/2023 and in fact had 1 dose of IV antibiotics and is currently on oral antibiotics.  He has followed up with nephrology who have been managing his hypertension as well as chronic kidney disease stage II. Able to cook food for himself ,  starting to do larger meals, he feels like he can carry hot pans from the oven.  He is working on heavier trays.  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 Back to driving no issues reported   Left hand tingling and right face tingling and still has poor balance   Pain Inventory Average Pain 2 Pain Right Now 0 My pain is intermittent and tingling  LOCATION OF PAIN  right side of face, left hand  BOWEL Number of stools per week: 2 Oral laxative use No   BLADDER Normal   Mobility use a walker how many minutes can you walk? 25-30 minutes ability to climb steps?  yes do you drive?  yes Do you have any goals in this area?  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  yes  Neuro/Psych numbness tingling  Prior Studies Any changes since last visit?  yes chest Xray - DX Pneumonia   Physicians involved in your care Any changes since last visit?  no   Family History  Problem Relation Age of Onset   Cancer Mother    Congestive Heart Failure Father    Cancer Sister    Social History   Socioeconomic History   Marital status: Single    Spouse name: Not on file   Number of children: Not on file   Years of education: Not on file  Highest education level: Not on file  Occupational History   Not on file  Tobacco Use   Smoking status: Never   Smokeless tobacco: Never  Vaping Use   Vaping status: Never Used  Substance and Sexual Activity   Alcohol use: Not Currently    Comment: socially   Drug use: No   Sexual activity: Not on file  Other Topics Concern   Not on file  Social History Narrative   Not on file   Social Determinants of Health   Financial Resource Strain: Not on file  Food Insecurity: No Food Insecurity (02/27/2023)   Hunger Vital Sign    Worried About Running Out of Food in the Last Year: Never true    Ran Out of Food in the Last Year: Never true  Transportation Needs: No  Transportation Needs (02/27/2023)   PRAPARE - Administrator, Civil Service (Medical): No    Lack of Transportation (Non-Medical): No  Physical Activity: Not on file  Stress: Not on file  Social Connections: Unknown (11/17/2021)   [facility] from [facility], [facility]   Social Network    Social Network: Not on file   Past Surgical History:  Procedure Laterality Date   INCISION AND DRAINAGE ABSCESS Right 04/21/2021   Procedure: INCISION, DRAINAGE, DEBRIDEMENT OF RIGHT THIGH/GROIN INFECTION;  Surgeon: Abigail Miyamoto, MD;  Location: [facility];  Service: General;  Laterality: Right;   IRRIGATION AND DEBRIDEMENT ABSCESS Right 04/23/2021   Procedure: IRRIGATION AND DEBRIDEMENT RIGHT GROIN WOUND; RIGHT THIGH DRESSING CHANGE;  Surgeon: Abigail Miyamoto, MD;  Location: [facility];  Service: General;  Laterality: Right;   IRRIGATION AND DEBRIDEMENT ABSCESS Right 04/26/2021   Procedure: reexploration and debridement of soft tissue infection right groin;  Surgeon: Emelia Loron, MD;  Location: [facility];  Service: General;  Laterality: Right;   RETINAL DETACHMENT SURGERY  2022   TONSILLECTOMY  2003   Past Medical History:  Diagnosis Date   CKD stage 3a, GFR 45-59 ml/min (HCC)    Diabetes mellitus    Hypertension    Necrotizing fasciitis (HCC) 04/2021   right groin   Stroke (HCC)    Ht 5\' 10"  (1.778 m)   Wt 270 lb (122.5 kg)   BMI 38.74 kg/m   Opioid Risk Score:   Fall Risk Score:  `1  Depression screen [facility] 2/9     06/06/2021    2:09 PM  Depression screen PHQ 2/9  Decreased Interest 0  Down, Depressed, Hopeless 0  PHQ - 2 Score 0  Altered sleeping 1  Tired, decreased energy 1  Change in appetite 0  Feeling bad or failure about yourself  0  Trouble concentrating 0  Moving slowly or fidgety/restless 0  Suicidal thoughts 0  PHQ-9 Score 2    Review of Systems  Musculoskeletal:  Positive for gait problem.  All other systems reviewed and are negative.       Objective:   Physical Exam  General No acute distress Mood and affect appropriate He ambulates with a walker on outdoor surfaces indoors he is able to ambulate without assistive device no evidence of toe drag or knee instability he has a widened base of support.  He is able to do some toe walking as well as heel walking but has difficulty with tandem gait. Finger-nose-finger testing shows minimal dysmetria left upper extremity negative dysdiadochokinesis with rapid alternating supination pronation bilateral upper extremity Motor strength is 5 - in the left deltoid bicep tricep grip 5/5 in  the right deltoid bicep tricep grip 5/5 bilateral lower extremity Sensation intact to pinprick and proprioception but altered to temperature sensation in the left upper extremity. There is no evidence of facial droop Extraocular muscles are intact No evidence of visual field cut.  No evidence of nystagmus      Assessment & Plan:   1.  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  In addition he does have some temperature sensation alterations in the left upper extremity.  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  I will plan to reassess him in early February and hope to release him at that point.  Discussed with patient agree with plan.  Wrote him a letter that he can use for the court in deferring child support payments until he is back to work.

## 2023-06-04 ENCOUNTER — Ambulatory Visit (INDEPENDENT_AMBULATORY_CARE_PROVIDER_SITE_OTHER): Payer: Self-pay | Admitting: Neurology

## 2023-06-04 ENCOUNTER — Encounter: Payer: Self-pay | Admitting: Neurology

## 2023-06-04 VITALS — BP 160/87 | HR 85 | Ht 70.0 in | Wt 260.0 lb

## 2023-06-04 DIAGNOSIS — R202 Paresthesia of skin: Secondary | ICD-10-CM

## 2023-06-04 DIAGNOSIS — I639 Cerebral infarction, unspecified: Secondary | ICD-10-CM

## 2023-06-04 DIAGNOSIS — G4733 Obstructive sleep apnea (adult) (pediatric): Secondary | ICD-10-CM

## 2023-06-04 NOTE — Progress Notes (Signed)
[facility] [address] [place]. [place] [address] [phone]       OFFICE FOLLOW-UP NOTE  [name] Date of Birth:  Nov 01, 1985 Medical Record Number:  [id number]   HPI: Bradley Davis is a 37 year old African-American male seen today for initial office follow-up visit for stroke.  History is obtained from the patient and review of electronic medical records and I have personally reviewed pertinent available imaging films in PACS.  He has past medical history significant for hypertension, chronic kidney disease stage IIIa, diabetes and obesity.  He presented on initially 02/27/2023 with nausea vomiting for a day with intermittent positional dizziness and right-sided headache.  He appeared dehydrated in the emergency room and was treated with hydration he developed acute renal failure with creatinine of 3.6 and was admitted.  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  MR angiogram of the head and neck showed no large vessel stenosis or occlusion.  2D echo showed ejection fraction of 55 to 60%.  Left atrial size was normal.  There is no right-to-left shunt.  LDL cholesterol 130 mg percent.  Hemoglobin A1c was 13.8.  Patient was started on dual antiplatelet therapy aspirin Plavix for 3 weeks followed by aspirin alone.  He was transferred for rehabilitation to rehab.  Patient states is done well since then.  He was enrolled in the sleep smart study and randomized to the CPAP treatment arm.  Patient is currently still doing outpatient physical therapy.  He can walk short distances independently but prefers to use a walker for longer distances.  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  He is still on medical leave.  He still has persistent tingling and numbness in the right side of the face which is intermittent but not bothersome.  He has been  compliant with using his CPAP every day.  He is tolerating Lipitor well without muscle aches and pains and joint without bruising or bleeding.  States his blood pressure is under good control.  ROS:   14 system review of systems is positive for tingling, numbness, difficulty walking all other systems negative  PMH:  Past Medical History:  Diagnosis Date   CKD stage 3a, GFR 45-59 ml/min (HCC)    Diabetes mellitus    Hypertension    Necrotizing fasciitis (HCC) 04/2021   right groin   Stroke [facility])     Social History:  Social History   Socioeconomic History   Marital status: Single    Spouse name: Not on file   Number of children: Not on file   Years of education: Not on file   Highest education level: Not on file  Occupational History   Not on file  Tobacco Use   Smoking status: Never   Smokeless tobacco: Never  Vaping Use   Vaping status: Never Used  Substance and Sexual Activity   Alcohol use: Not Currently    Comment: socially   Drug use: No   Sexual activity: Not on file  Other Topics Concern   Not on file  Social History Narrative   Not on file   Social Determinants of Health   Financial Resource Strain: Not on file  Food Insecurity: No Food Insecurity (02/27/2023)   Hunger Vital Sign    Worried About Running Out of Food in the Last Year: Never true    Ran Out of Food  in the Last Year: Never true  Transportation Needs: No Transportation Needs (02/27/2023)   PRAPARE - Administrator, Civil Service (Medical): No    Lack of Transportation (Non-Medical): No  Physical Activity: Not on file  Stress: Not on file  Social Connections: Unknown (11/17/2021)   Received from [facility], [facility]   Social Network    Social Network: Not on file  Intimate Partner Violence: Not At Risk (02/27/2023)   Humiliation, Afraid, Rape, and Kick questionnaire    Fear of Current or Ex-Partner: No    Emotionally Abused: No    Physically Abused: No    Sexually  Abused: No    Medications:   Current Outpatient Medications on File Prior to Visit  Medication Sig Dispense Refill   acetaminophen (TYLENOL) 325 MG tablet Take 650 mg by mouth as needed for mild pain or headache.     amLODipine (NORVASC) 10 MG tablet Take 1 tablet by mouth daily.     atorvastatin (LIPITOR) 80 MG tablet Take 1 tablet by mouth daily.     benazepril (LOTENSIN) 20 MG tablet Take 1 tablet by mouth daily.     cloNIDine (CATAPRES - DOSED IN MG/24 HR) 0.1 mg/24hr patch Place 1 patch (0.1 mg total) onto the skin once a week. Change every Friday 4 patch 12   cloNIDine (CATAPRES - DOSED IN MG/24 HR) 0.1 mg/24hr patch Place onto the skin.     Continuous Glucose Sensor (DEXCOM G7 SENSOR) MISC Use 1 sensor every 10 days.     [facility] ASPIRIN EC 81 MG tablet SMARTSIG:1 Tablet(s) By Mouth Daily     ezetimibe (ZETIA) 10 MG tablet Take 1 tablet by mouth daily.     hydrALAZINE (APRESOLINE) 100 MG tablet Take 1 tablet (100 mg total) by mouth every 8 (eight) hours. 90 tablet 0   Insulin Disposable Pump (OMNIPOD 5 G6 INTRO, GEN 5,) KIT Use for insulin administration.  Patient will be using Dexcom G7.     Insulin Disposable Pump (OMNIPOD 5 G6 PODS, GEN 5,) MISC Use one pod every 72 hours for insulin administration.  Patient will be using Dexcom G7 CGM.     Insulin Glargine (BASAGLAR KWIKPEN) 100 UNIT/ML Inject 25 Units into the skin 2 (two) times daily.     labetalol (NORMODYNE) 300 MG tablet Take by mouth.     pantoprazole (PROTONIX) 40 MG tablet Take 1 tablet (40 mg total) by mouth daily. 30 tablet 0   senna (SENOKOT) 8.6 MG TABS tablet Take 1 tablet (8.6 mg total) by mouth 2 (two) times daily. 60 tablet 0   sodium bicarbonate 650 MG tablet Take 1 tablet (650 mg total) by mouth 3 (three) times daily. 90 tablet 0   azithromycin (ZITHROMAX) 250 MG tablet Take 1 tablet (250 mg total) by mouth daily. Take first 2 tablets together, then 1 every day until finished. (Patient not taking: Reported on  06/04/2023) 6 tablet 0   benazepril (LOTENSIN) 10 MG tablet Take 1 tablet by mouth daily. (Patient not taking: Reported on 05/30/2023)     clopidogrel (PLAVIX) 75 MG tablet Take 1 tablet (75 mg total) by mouth daily. (Patient not taking: Reported on 05/30/2023) 7 tablet 0   insulin aspart (NOVOLOG) 100 UNIT/ML injection USE AS DIRECTED IN INSULIN PUMP. MAX DAILY DOSE IS 150 UNITS (Patient not taking: Reported on 06/04/2023)     No current facility-administered medications on file prior to visit.    Allergies:   Allergies  Allergen Reactions  Ibuprofen Swelling    Pt reports taking after reported allergy w/ no reactions    Physical Exam General: Obese middle-aged African-American male, seated, in no evident distress Head: head normocephalic and atraumatic.  Neck: supple with no carotid or supraclavicular bruits Cardiovascular: regular rate and rhythm, no murmurs Musculoskeletal: no deformity Skin:  no rash/petichiae Vascular:  Normal pulses all extremities Vitals:   06/04/23 0943  BP: (!) 160/87  Pulse: 85   Neurologic Exam Mental Status: Awake and fully alert. Oriented to place and time. Recent and remote memory intact. Attention span, concentration and fund of knowledge appropriate. Mood and affect appropriate.  Cranial Nerves: Fundoscopic exam reveals sharp disc margins. Pupils equal, briskly reactive to light. Extraocular movements full without nystagmus. Visual fields full to confrontation. Hearing intact. Facial sensation intact.  Leftt nasolabial fold asymmetry., tongue, palate moves normally and symmetrically.  Motor: Normal bulk and tone. Normal strength in all tested extremity muscles. Sensory.: intact to touch ,pinprick .position and vibratory sensation.  Subjective paresthesia on the right side of the face. Coordination: Rapid alternating movements normal in all extremities. Finger-to-nose and heel-to-shin performed accurately bilaterally. Gait and Station: Arises from  chair without difficulty. Stance is normal. Gait demonstrates normal stride length and balance . Able to heel, toe and tandem walk with moderate difficulty.  Reflexes: 1+ and symmetric. Toes downgoing.   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  Vascular risk factors of hyperlipidemia, diabetes, obesity, sleep apnea.     PLAN:I had a long d/w patient about his recent stroke, risk for recurrent stroke/TIAs, personally independently reviewed imaging studies and stroke evaluation results and answered questions.Continue aspirin 81 mg daily  for secondary stroke prevention and maintain strict control of hypertension with blood pressure goal below 130/90, diabetes with hemoglobin A1c goal below 6.5% and lipids with LDL cholesterol goal below 70 mg/dL. I also advised the patient to eat a healthy diet with plenty of whole grains, cereals, fruits and vegetables, exercise regularly and maintain ideal body weight .continue participation in the sleep smart study and I counseled him to use his CPAP every night.  Followup in the future with me in 6 months or call earlier if necessary.  Greater than 50% of time during this 35 minute visit was spent on counseling,explanation of diagnosis, planning of further management, discussion with patient and family and coordination of care Delia Heady, MD Note: This document was prepared with digital dictation and possible smart phrase technology. Any transcriptional errors that result from this process are unintentional

## 2023-06-04 NOTE — Patient Instructions (Signed)
I had a long d/w patient about his recent stroke, risk for recurrent stroke/TIAs, personally independently reviewed imaging studies and stroke evaluation results and answered questions.Continue aspirin 81 mg daily  for secondary stroke prevention and maintain strict control of hypertension with blood pressure goal below 130/90, diabetes with hemoglobin A1c goal below 6.5% and lipids with LDL cholesterol goal below 70 mg/dL. I also advised the patient to eat a healthy diet with plenty of whole grains, cereals, fruits and vegetables, exercise regularly and maintain ideal body weight .continue participation in the sleep smart study and I counseled him to use his CPAP every night.  Followup in the future with me in 6 months or call earlier if necessary.  Stroke Prevention Some medical conditions and behaviors can lead to a higher chance of having a stroke. You can help prevent a stroke by eating healthy, exercising, not smoking, and managing any medical conditions you have. Stroke is a leading cause of functional impairment. Primary prevention is particularly important because a majority of strokes are first-time events. Stroke changes the lives of not only those who experience a stroke but also their family and other caregivers. How can this condition affect me? A stroke is a medical emergency and should be treated right away. A stroke can lead to brain damage and can sometimes be life-threatening. If a person gets medical treatment right away, there is a better chance of surviving and recovering from a stroke. What can increase my risk? The following medical conditions may increase your risk of a stroke: Cardiovascular disease. High blood pressure (hypertension). Diabetes. High cholesterol. Sickle cell disease. Blood clotting disorders (hypercoagulable state). Obesity. Sleep disorders (obstructive sleep apnea). Other risk factors include: Being older than age 77. Having a history of blood clots,  stroke, or mini-stroke (transient ischemic attack, TIA). Genetic factors, such as race, ethnicity, or a family history of stroke. Smoking cigarettes or using other tobacco products. Taking birth control pills, especially if you also use tobacco. Heavy use of alcohol or drugs, especially cocaine and methamphetamine. Physical inactivity. What actions can I take to prevent this? Manage your health conditions High cholesterol levels. Eating a healthy diet is important for preventing high cholesterol. If cholesterol cannot be managed through diet alone, you may need to take medicines. Take any prescribed medicines to control your cholesterol as told by your health care provider. Hypertension. To reduce your risk of stroke, try to keep your blood pressure below 130/80. Eating a healthy diet and exercising regularly are important for controlling blood pressure. If these steps are not enough to manage your blood pressure, you may need to take medicines. Take any prescribed medicines to control hypertension as told by your health care provider. Ask your health care provider if you should monitor your blood pressure at home. Have your blood pressure checked every year, even if your blood pressure is normal. Blood pressure increases with age and some medical conditions. Diabetes. Eating a healthy diet and exercising regularly are important parts of managing your blood sugar (glucose). If your blood sugar cannot be managed through diet and exercise, you may need to take medicines. Take any prescribed medicines to control your diabetes as told by your health care provider. Get evaluated for obstructive sleep apnea. Talk to your health care provider about getting a sleep evaluation if you snore a lot or have excessive sleepiness. Make sure that any other medical conditions you have, such as atrial fibrillation or atherosclerosis, are managed. Nutrition Follow instructions from your health care  provider  about what to eat or drink to help manage your health condition. These instructions may include: Reducing your daily calorie intake. Limiting how much salt (sodium) you use to 1,500 milligrams (mg) each day. Using only healthy fats for cooking, such as olive oil, canola oil, or sunflower oil. Eating healthy foods. You can do this by: Choosing foods that are high in fiber, such as whole grains, and fresh fruits and vegetables. Eating at least 5 servings of fruits and vegetables a day. Try to fill one-half of your plate with fruits and vegetables at each meal. Choosing lean protein foods, such as lean cuts of meat, poultry without skin, fish, tofu, beans, and nuts. Eating low-fat dairy products. Avoiding foods that are high in sodium. This can help lower blood pressure. Avoiding foods that have saturated fat, trans fat, and cholesterol. This can help prevent high cholesterol. Avoiding processed and prepared foods. Counting your daily carbohydrate intake.  Lifestyle If you drink alcohol: Limit how much you have to: 0-1 drink a day for women who are not pregnant. 0-2 drinks a day for men. Know how much alcohol is in your drink. In the [place] one drink equals one 12 oz bottle of beer ( ), one 5 oz glass of wine ( ), or one 1 oz glass of hard liquor (44mL). Do not use any products that contain nicotine or tobacco. These products include cigarettes, chewing tobacco, and vaping devices, such as e-cigarettes. If you need help quitting, ask your health care provider. Avoid secondhand smoke. Do not use drugs. Activity  Try to stay at a healthy weight. Get at least 30 minutes of exercise on most days, such as: Fast walking. Biking. Swimming. Medicines Take over-the-counter and prescription medicines only as told by your health care provider. Aspirin or blood thinners (antiplatelets or anticoagulants) may be recommended to reduce your risk of forming blood clots that can lead to  stroke. Avoid taking birth control pills. Talk to your health care provider about the risks of taking birth control pills if: You are over 67 years old. You smoke. You get very bad headaches. You have had a blood clot. Where to find more information American Stroke Association: www.strokeassociation.org Get help right away if: You or a loved one has any symptoms of a stroke. "BE FAST" is an easy way to remember the main warning signs of a stroke: B - Balance. Signs are dizziness, sudden trouble walking, or loss of balance. E - Eyes. Signs are trouble seeing or a sudden change in vision. F - Face. Signs are sudden weakness or numbness of the face, or the face or eyelid drooping on one side. A - Arms. Signs are weakness or numbness in an arm. This happens suddenly and usually on one side of the body. S - Speech. Signs are sudden trouble speaking, slurred speech, or trouble understanding what people say. T - Time. Time to call emergency services. Write down what time symptoms started. You or a loved one has other signs of a stroke, such as: A sudden, severe headache with no known cause. Nausea or vomiting. Seizure. These symptoms may represent a serious problem that is an emergency. Do not wait to see if the symptoms will go away. Get medical help right away. Call your local emergency services (911 in the [place] Do not drive yourself to the hospital. Summary You can help to prevent a stroke by eating healthy, exercising, not smoking, limiting alcohol intake, and managing any medical conditions you may have. Do  not use any products that contain nicotine or tobacco. These include cigarettes, chewing tobacco, and vaping devices, such as e-cigarettes. If you need help quitting, ask your health care provider. Remember "BE FAST" for warning signs of a stroke. Get help right away if you or a loved one has any of these signs. This information is not intended to replace advice given to you by your  health care provider. Make sure you discuss any questions you have with your health care provider. Document Revised: 05/28/2022 Document Reviewed: 05/28/2022 Elsevier Patient Education  2024 [employer].

## 2023-06-05 ENCOUNTER — Other Ambulatory Visit: Payer: Self-pay

## 2023-06-05 ENCOUNTER — Ambulatory Visit: Payer: Medicaid Other

## 2023-06-05 DIAGNOSIS — R2681 Unsteadiness on feet: Secondary | ICD-10-CM

## 2023-06-05 DIAGNOSIS — I639 Cerebral infarction, unspecified: Secondary | ICD-10-CM

## 2023-06-05 DIAGNOSIS — R2689 Other abnormalities of gait and mobility: Secondary | ICD-10-CM

## 2023-06-05 DIAGNOSIS — M6281 Muscle weakness (generalized): Secondary | ICD-10-CM

## 2023-06-05 DIAGNOSIS — R6889 Other general symptoms and signs: Secondary | ICD-10-CM

## 2023-06-05 DIAGNOSIS — R278 Other lack of coordination: Secondary | ICD-10-CM

## 2023-06-05 NOTE — Therapy (Signed)
OUTPATIENT PHYSICAL THERAPY NEURO TREATMENT   Patient Name: Bradley Davis MRN: [id number] DOB:07/04/1986, 37 y.o., male Today's Date: 06/05/2023  PCP: Darnelle Going REFERRING PROVIDER: Delle Reining, PA  END OF SESSION:  PT End of Session - 06/05/23 1316     Visit Number 9    Date for PT Re-Evaluation 07/17/23    Progress Note Due on Visit 10    PT Start Time 1315    PT Stop Time 1345    PT Time Calculation (min) 30 min              Past Medical History:  Diagnosis Date   CKD stage 3a, GFR 45-59 ml/min (HCC)    Diabetes mellitus    Hypertension    Necrotizing fasciitis (HCC) 04/2021   right groin   Stroke [facility])    Past Surgical History:  Procedure Laterality Date   INCISION AND DRAINAGE ABSCESS Right 04/21/2021   Procedure: INCISION, DRAINAGE, DEBRIDEMENT OF RIGHT THIGH/GROIN INFECTION;  Surgeon: Abigail Miyamoto, MD;  Location: [facility];  Service: General;  Laterality: Right;   IRRIGATION AND DEBRIDEMENT ABSCESS Right 04/23/2021   Procedure: IRRIGATION AND DEBRIDEMENT RIGHT GROIN WOUND; RIGHT THIGH DRESSING CHANGE;  Surgeon: Abigail Miyamoto, MD;  Location: [facility];  Service: General;  Laterality: Right;   IRRIGATION AND DEBRIDEMENT ABSCESS Right 04/26/2021   Procedure: reexploration and debridement of soft tissue infection right groin;  Surgeon: Emelia Loron, MD;  Location: [facility];  Service: General;  Laterality: Right;   RETINAL DETACHMENT SURGERY  2022   TONSILLECTOMY  2003   Patient Active Problem List   Diagnosis Date Noted   Constipation 03/18/2023   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   OSA (obstructive sleep apnea) 03/06/2023   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   Intractable nausea and vomiting 02/28/2023   Acute renal failure superimposed on stage 3a chronic kidney disease (HCC) 02/27/2023   DM (diabetes mellitus), 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   Essential hypertension 04/23/2021   Necrotizing fasciitis (HCC) 04/22/2021     ONSET DATE: 02/09/23   REFERRING DIAG: cerebellar stroke  THERAPY DIAG:  Muscle weakness (generalized)  Decreased activity tolerance  Unsteadiness on feet  Other abnormalities of gait and mobility  Cerebellar stroke (HCC)  Other lack of coordination  Rationale for Evaluation and Treatment: Rehabilitation  SUBJECTIVE:                                                                                                                                                                                             SUBJECTIVE STATEMENT: No falls, feels ok,cleared to drive now  Pt accompanied by: self  PERTINENT HISTORY: cerebellar stroke also renal disease, hospitalized 8/3/to 9/9/224 for rehab.  Referred to outpt PT  PAIN:  Are you having pain? No  PRECAUTIONS: Other: BP  RED FLAGS: None   WEIGHT BEARING RESTRICTIONS: No  FALLS: Has patient fallen in last 6 months? Yes. Number of falls 4  LIVING ENVIRONMENT: Lives with: lives with their family Lives in: House/apartment Has following equipment at home: Single point cane and Environmental consultant - 4 wheeled  PLOF: Independent  PATIENT GOALS: return to I driving, walking, managing my food truck  OBJECTIVE:  Note: Objective measures were completed at Evaluation unless otherwise noted.  DIAGNOSTIC FINDINGS:copied from hospitalist notes: MRI brain done for workup showing small acute infarct in lateral right medulla and deep right cerebral white matter adjacent to lateral ventricle.   COGNITION: Overall cognitive status: Within functional limits for tasks assessed   SENSATION: WFL  COORDINATION: Alt toe taps, symmetrical toe taps in sitting wnl  EDEMA:  None noted  MUSCLE TONE: all wnl    LOWER EXTREMITY ROM:   all LE ROM wnl  LOWER EXTREMITY MMT:  burst testing B LE 's wnl  BED MOBILITY: NA today   TRANSFERS: Assistive device utilized: Environmental consultant - 4 wheeled  Sit to stand: Modified independence Stand to sit: Modified  independence   GAIT: Gait pattern: lateral lean- Right and wide BOS Distance walked: 80 Assistive device utilized: Walker - 4 wheeled Level of assistance: Modified independence   Monitored BO in lobby,BP 166/106 After walking 40' lobby to clinic: BP 187/115 After walking 80' BP 196/117 Stopped eval at this pt and called PCP to report he was advised to go to urgent care FUNCTIONAL TESTS:  FGA 16/30  PATIENT SURVEYS:    TODAY'S TREATMENT:                                                                                                                              DATE:  06/05/23: Abbreviated session due to  pt 15 min late Leg press B 70#, 3 x 10  In ll bars for rockerboard for/back In ll bars for rockerboard side/side On min tramp 10 to 30 sec intervals of B LE jumps  Side to side jumps on mini tramp 20 sec x 2 Single leg balancing, then hinge at hips, shoulders at 90 degrees abd, able to perform with opposite toe tapping   05/27/23: Quadriped alt arm and leg lifts 10 x each Squats with 10# with black t band around thighs for cue for hip abductors, 10 # Leg press B 70#, 3 x 10  Hamstring curls, 35# 3 x 10 Knee extension 10# 3 x 10 Standing hip abduction 3# cuff weight 15x Standing hip ext 3# cuff wt 15x Fast travelling laterally over airex, with rollator for balance, fast cadence Alt forward step ups onto airex with opposite knee drivers, 15 x each, rollator for balance, fast cadence   05/22/23 NuStep L5 x 6 minutes. Squats with  10# weight and G tband at knees to engage abductors, 2 x 10 reps Standing in parallel bars, hip abd pushes on Fitter press, 1 black band, 2 x 10 each leg, min VC for correct form Quadruped Bird dogs x 5 each, cat/cow Supine breathing exercises- deep breaths with TC of hands on ribs to fully open ribcage, and then resisted breathing for rib mobs. Patient educated in deep breathing techniques at home to facilitate improved sleep  05/15/23 Gait  outside today used the rollator, no rest, around the back building and to the front doot Leg extension 10# 2x10 Leg curls 25# 2x10 On airex 10# straight arm pulls On airex 5# chest press 70# leg press 2 x10 Lats 25# 2x10 5# hip extension and abduction very weak and very tired with the abduction had to have rest breaks 35# triceps 10# biceps  05/13/23 Gait outside with SBA without device around the back building with no rest, good pace, SOB and some wobbly legs at the end 10# straight arm extension 2x10 Leg press 70# 3x10 Resisted walking 50# all directions SLS  Gait outside no device SBA around the parking island in the back BP after this 172/94,, after 2 minutes rest 174/95  05/09/23 Nustep level 5 x 5 minutes Gait outside around the back parking [name] and then to the front door with Rollator and SBA 35# leg curls 2x10 Leg extension 10# 2x10 Leg press 70# 2x10 Calf press 70# 2x10 In pbars side stepping on airex balance beam, then tandem walk On airex ball toss On airex eyes closed On bosu upside down balance Sit to stand weighted ball 6.6# overhead reach Walking ball toss  05/08/23 Bike L3.5 x 4 minutes, 30 sec fast pedalling, RHR/SATS 93/97, EHR/SATS 98/97 Squats with 6# ball x 10 Side lunge with 6# ball x 10 each way to challenge both strength and balance Patient developed R piriformis spasm Lower and upper body stretching to relieve spasm and improve his sleep-lower traps stretch, figure 4 in sit, glut stretch in sit, HS stretch, deep STM with tennis ball Educated to deep breathing techniques to improve his breathing and promote improved relaxation before sleep.  04/29/23:  Completed FGA: 16/30 Mini jumps B LE's 30 sec in ll bars Jogging in ll bars 30 sec  Large step with reaches across trunk to target on wall behind  Dribbling 65 cm green physioball x 40' L hand, 40' R hand Wall ladder climbs 15 x each side, added horizontal head turns Corner for semitandem  standing for horizontal head turns. Leg press 70# B LE's  Leg press 70# B ankle plantarflexors  04/24/23   PATIENT EDUCATION: Education details: POC, goals Person educated: Patient Education method: Explanation, Demonstration, Tactile cues, and Verbal cues Education comprehension: verbalized understanding  HOME EXERCISE PROGRAM:  [id number]  GOALS: Goals reviewed with patient? Yes  SHORT TERM GOALS: Target date: 2 weeks 05/08/23  IHEP  Baseline:TBD Goal status: 05/08/23 Provided, ongoing   LONG TERM GOALS: Target date: 07/17/23  Able to complete FGA and score 30/30 Baseline: TBA 04/29/23: FGA 16/30 Goal status: INITIAL  2.  Able to walk greater than 1000' maintaining pace of 1 m/sec for consistent and efficient community ambulation Baseline: progressing 05/15/23 Goal status: progressing  3.  Sit to stand 30 sec, 15 sec or more to demonstrate endurance, coordination, functional strength Baseline: TBD Goal status: 05/22/23-ongoing  ASSESSMENT:  CLINICAL IMPRESSION: Today was Boden's 9th PT visit following a cerebellar CVA.  He has improved greatly since his  initial evaluation.  Continued to push his coordination, balance, added faster movements.  Will benefit from ongoing skilled PT to address his recovery.  He will follow up in the next couple of weeks with neurologist. OBJECTIVE IMPAIRMENTS: Abnormal gait, cardiopulmonary status limiting activity, decreased balance, and decreased coordination.   ACTIVITY LIMITATIONS: carrying, lifting, standing, squatting, stairs, locomotion level, and caring for others  PARTICIPATION LIMITATIONS: meal prep, cleaning, laundry, driving, occupation, and yard work  PERSONAL FACTORS: Behavior pattern, Past/current experiences, and Time since onset of injury/illness/exacerbation are also affecting patient's functional outcome.   REHAB POTENTIAL: Good  CLINICAL DECISION MAKING: Evolving/moderate complexity  EVALUATION COMPLEXITY:  Moderate  PLAN:  PT FREQUENCY: 2x/week  PT DURATION: 12 weeks  PLANNED INTERVENTIONS: 97110-Therapeutic exercises, 97530- Therapeutic activity, [id number]- Neuromuscular re-education, 97535- Self Care, and [id number]- Manual therapy  PLAN FOR NEXT SESSION:continue to progress strength and function  Abdelaziz Westenberger, , PT, DPT, OCS 06/05/2023, 5:30 PM

## 2023-06-10 ENCOUNTER — Ambulatory Visit: Payer: Medicaid Other | Attending: Physical Medicine and Rehabilitation

## 2023-06-10 ENCOUNTER — Other Ambulatory Visit: Payer: Self-pay

## 2023-06-10 DIAGNOSIS — R6889 Other general symptoms and signs: Secondary | ICD-10-CM | POA: Insufficient documentation

## 2023-06-10 DIAGNOSIS — I639 Cerebral infarction, unspecified: Secondary | ICD-10-CM | POA: Diagnosis present

## 2023-06-10 DIAGNOSIS — R2681 Unsteadiness on feet: Secondary | ICD-10-CM | POA: Diagnosis present

## 2023-06-10 DIAGNOSIS — R278 Other lack of coordination: Secondary | ICD-10-CM | POA: Diagnosis present

## 2023-06-10 DIAGNOSIS — M6281 Muscle weakness (generalized): Secondary | ICD-10-CM | POA: Insufficient documentation

## 2023-06-10 DIAGNOSIS — R2689 Other abnormalities of gait and mobility: Secondary | ICD-10-CM | POA: Insufficient documentation

## 2023-06-10 NOTE — Therapy (Signed)
OUTPATIENT PHYSICAL THERAPY NEURO TREATMENT   Patient Name: Bradley Davis MRN: [id number] DOB:1985/07/25, 37 y.o., male Today's Date: 06/10/2023 Progress Note Reporting Period 04/24/23 to 06/10/23  See note below for Objective Data and Assessment of Progress/Goals.     PCP: Darnelle Going REFERRING PROVIDER: Delle Reining, PA  END OF SESSION:  PT End of Session - 06/10/23 1311     Visit Number 10    Date for PT Re-Evaluation 07/17/23    Progress Note Due on Visit 20    PT Start Time 1312    PT Stop Time 1345    PT Time Calculation (min) 33 min    Activity Tolerance Patient tolerated treatment well    Behavior During Therapy WFL for tasks assessed/performed               Past Medical History:  Diagnosis Date   CKD stage 3a, GFR 45-59 ml/min (HCC)    Diabetes mellitus    Hypertension    Necrotizing fasciitis (HCC) 04/2021   right groin   Stroke [facility])    Past Surgical History:  Procedure Laterality Date   INCISION AND DRAINAGE ABSCESS Right 04/21/2021   Procedure: INCISION, DRAINAGE, DEBRIDEMENT OF RIGHT THIGH/GROIN INFECTION;  Surgeon: Abigail Miyamoto, MD;  Location: [facility];  Service: General;  Laterality: Right;   IRRIGATION AND DEBRIDEMENT ABSCESS Right 04/23/2021   Procedure: IRRIGATION AND DEBRIDEMENT RIGHT GROIN WOUND; RIGHT THIGH DRESSING CHANGE;  Surgeon: Abigail Miyamoto, MD;  Location: [facility];  Service: General;  Laterality: Right;   IRRIGATION AND DEBRIDEMENT ABSCESS Right 04/26/2021   Procedure: reexploration and debridement of soft tissue infection right groin;  Surgeon: Emelia Loron, MD;  Location: [facility];  Service: General;  Laterality: Right;   RETINAL DETACHMENT SURGERY  2022   TONSILLECTOMY  2003   Patient Active Problem List   Diagnosis Date Noted   Constipation 03/18/2023   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   OSA (obstructive sleep apnea) 03/06/2023   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   Intractable nausea and vomiting 02/28/2023    Acute renal failure superimposed on stage 3a chronic kidney disease (HCC) 02/27/2023   DM (diabetes mellitus), 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   Essential hypertension 04/23/2021   Necrotizing fasciitis (HCC) 04/22/2021    ONSET DATE: 02/09/23   REFERRING DIAG: cerebellar stroke  THERAPY DIAG:  Muscle weakness (generalized)  Decreased activity tolerance  Unsteadiness on feet  Other abnormalities of gait and mobility  Cerebellar stroke (HCC)  Other lack of coordination  Rationale for Evaluation and Treatment: Rehabilitation  SUBJECTIVE:  SUBJECTIVE STATEMENT: No falls, feels ok, taking naps during the day. Uses rollator when more fatigued  Pt accompanied by: self  PERTINENT HISTORY: cerebellar stroke also renal disease, hospitalized 8/3/to 9/9/224 for rehab.  Referred to outpt PT  PAIN:  Are you having pain? No  PRECAUTIONS: Other: BP  RED FLAGS: None   WEIGHT BEARING RESTRICTIONS: No  FALLS: Has patient fallen in last 6 months? Yes. Number of falls 4  LIVING ENVIRONMENT: Lives with: lives with their family Lives in: House/apartment Has following equipment at home: Single point cane and Environmental consultant - 4 wheeled  PLOF: Independent  PATIENT GOALS: return to I driving, walking, managing my food truck  OBJECTIVE:  Note: Objective measures were completed at Evaluation unless otherwise noted.  DIAGNOSTIC FINDINGS:copied from hospitalist notes: MRI brain done for workup showing small acute infarct in lateral right medulla and deep right cerebral white matter adjacent to lateral ventricle.   COGNITION: Overall cognitive status: Within functional limits for tasks assessed   SENSATION: WFL  COORDINATION: Alt toe taps, symmetrical toe taps in sitting wnl  EDEMA:  None  noted  MUSCLE TONE: all wnl    LOWER EXTREMITY ROM:   all LE ROM wnl  LOWER EXTREMITY MMT:  burst testing B LE 's wnl  BED MOBILITY: NA today   TRANSFERS: Assistive device utilized: Environmental consultant - 4 wheeled  Sit to stand: Modified independence Stand to sit: Modified independence   GAIT: Gait pattern: lateral lean- Right and wide BOS Distance walked: 80 Assistive device utilized: Walker - 4 wheeled Level of assistance: Modified independence   Monitored BO in lobby,BP 166/106 After walking 40' lobby to clinic: BP 187/115 After walking 80' BP 196/117 Stopped eval at this pt and called PCP to report he was advised to go to urgent care FUNCTIONAL TESTS:  FGA 16/30  PATIENT SURVEYS:    TODAY'S TREATMENT:                                                                                                                              DATE:  06/10/23:  Abbreviated session, pt 12 min late Recumbent cycle 5 min level 3 FGA In ll bars for rocker board for/back and side/side Unilateral standing, hinging at hip with arms in T position brief holds, multiple reps   06/05/23: Abbreviated session due to  pt 15 min late Leg press B 70#, 3 x 10  In ll bars for rockerboard for/back In ll bars for rockerboard side/side On min tramp 10 to 30 sec intervals of B LE jumps  Side to side jumps on mini tramp 20 sec x 2 Single leg balancing, then hinge at hips, shoulders at 90 degrees abd, able to perform with opposite toe tapping   05/27/23: Quadriped alt arm and leg lifts 10 x each Squats with 10# with black t band around thighs for cue for hip abductors, 10 # Leg press B 70#, 3 x 10  Hamstring curls, 35#  3 x 10 Knee extension 10# 3 x 10 Standing hip abduction 3# cuff weight 15x Standing hip ext 3# cuff wt 15x Fast travelling laterally over airex, with rollator for balance, fast cadence Alt forward step ups onto airex with opposite knee drivers, 15 x each, rollator for balance, fast  cadence   05/22/23 NuStep L5 x 6 minutes. Squats with 10# weight and G tband at knees to engage abductors, 2 x 10 reps Standing in parallel bars, hip abd pushes on Fitter press, 1 black band, 2 x 10 each leg, min VC for correct form Quadruped Bird dogs x 5 each, cat/cow Supine breathing exercises- deep breaths with TC of hands on ribs to fully open ribcage, and then resisted breathing for rib mobs. Patient educated in deep breathing techniques at home to facilitate improved sleep  05/15/23 Gait outside today used the rollator, no rest, around the back building and to the front doot Leg extension 10# 2x10 Leg curls 25# 2x10 On airex 10# straight arm pulls On airex 5# chest press 70# leg press 2 x10 Lats 25# 2x10 5# hip extension and abduction very weak and very tired with the abduction had to have rest breaks 35# triceps 10# biceps  05/13/23 Gait outside with SBA without device around the back building with no rest, good pace, SOB and some wobbly legs at the end 10# straight arm extension 2x10 Leg press 70# 3x10 Resisted walking 50# all directions SLS  Gait outside no device SBA around the parking island in the back BP after this 172/94,, after 2 minutes rest 174/95  05/09/23 Nustep level 5 x 5 minutes Gait outside around the back parking [name] and then to the front door with Rollator and SBA 35# leg curls 2x10 Leg extension 10# 2x10 Leg press 70# 2x10 Calf press 70# 2x10 In pbars side stepping on airex balance beam, then tandem walk On airex ball toss On airex eyes closed On bosu upside down balance Sit to stand weighted ball 6.6# overhead reach Walking ball toss  05/08/23 Bike L3.5 x 4 minutes, 30 sec fast pedalling, RHR/SATS 93/97, EHR/SATS 98/97 Squats with 6# ball x 10 Side lunge with 6# ball x 10 each way to challenge both strength and balance Patient developed R piriformis spasm Lower and upper body stretching to relieve spasm and improve his sleep-lower traps  stretch, figure 4 in sit, glut stretch in sit, HS stretch, deep STM with tennis ball Educated to deep breathing techniques to improve his breathing and promote improved relaxation before sleep.  04/29/23:  Completed FGA: 16/30 Mini jumps B LE's 30 sec in ll bars Jogging in ll bars 30 sec  Large step with reaches across trunk to target on wall behind  Dribbling 65 cm green physioball x 40' L hand, 40' R hand Wall ladder climbs 15 x each side, added horizontal head turns Corner for semitandem standing for horizontal head turns. Leg press 70# B LE's  Leg press 70# B ankle plantarflexors  04/24/23   PATIENT EDUCATION: Education details: POC, goals Person educated: Patient Education method: Explanation, Demonstration, Tactile cues, and Verbal cues Education comprehension: verbalized understanding  HOME EXERCISE PROGRAM:  [id number]  GOALS: Goals reviewed with patient? Yes  SHORT TERM GOALS: Target date: 2 weeks 05/08/23  IHEP  Baseline:TBD Goal status: 05/08/23 Provided, ongoing   LONG TERM GOALS: Target date: 07/17/23  Able to complete FGA and score 30/30 Baseline: TBA 04/29/23: FGA 16/30, 06/10/23: 24/30 Goal status: progressing  2.  Able to walk greater  than 1000' maintaining pace of 1 m/sec for consistent and efficient community ambulation Baseline: progressing 05/15/23 Goal status: 12/2/24progressing  3.  Sit to stand 30 sec, 15 sec or more to demonstrate endurance, coordination, functional strength Baseline: TBD Goal status: 05/22/23-ongoing 06/10/23: ongoing  ASSESSMENT:  CLINICAL IMPRESSION: Today was Zakry's 10th PT visit following a cerebellar CVA.  He has improved greatly since his initial evaluation, much improved with FGA.  Difficulty with higher level coordination and balance activities.  Continued to push his coordination, balance, Will benefit from ongoing skilled PT to address his recovery.  He will follow up in the next couple of weeks with  neurologist. OBJECTIVE IMPAIRMENTS: Abnormal gait, cardiopulmonary status limiting activity, decreased balance, and decreased coordination.   ACTIVITY LIMITATIONS: carrying, lifting, standing, squatting, stairs, locomotion level, and caring for others  PARTICIPATION LIMITATIONS: meal prep, cleaning, laundry, driving, occupation, and yard work  PERSONAL FACTORS: Behavior pattern, Past/current experiences, and Time since onset of injury/illness/exacerbation are also affecting patient's functional outcome.   REHAB POTENTIAL: Good  CLINICAL DECISION MAKING: Evolving/moderate complexity  EVALUATION COMPLEXITY: Moderate  PLAN:  PT FREQUENCY: 2x/week  PT DURATION: 12 weeks  PLANNED INTERVENTIONS: 97110-Therapeutic exercises, 97530- Therapeutic activity, [id number]- Neuromuscular re-education, 97535- Self Care, and 29528- Manual therapy  PLAN FOR NEXT SESSION:continue to progress strength and function  Daesean Lazarz, , PT, DPT, OCS 06/10/2023, 5:06 PM

## 2023-06-17 ENCOUNTER — Encounter: Payer: Self-pay | Admitting: Physical Therapy

## 2023-06-17 ENCOUNTER — Other Ambulatory Visit: Payer: Self-pay

## 2023-06-17 ENCOUNTER — Ambulatory Visit: Payer: Medicaid Other | Admitting: Physical Therapy

## 2023-06-17 DIAGNOSIS — R278 Other lack of coordination: Secondary | ICD-10-CM

## 2023-06-17 DIAGNOSIS — R2689 Other abnormalities of gait and mobility: Secondary | ICD-10-CM

## 2023-06-17 DIAGNOSIS — M6281 Muscle weakness (generalized): Secondary | ICD-10-CM

## 2023-06-17 DIAGNOSIS — R2681 Unsteadiness on feet: Secondary | ICD-10-CM

## 2023-06-17 DIAGNOSIS — R6889 Other general symptoms and signs: Secondary | ICD-10-CM

## 2023-06-17 DIAGNOSIS — I639 Cerebral infarction, unspecified: Secondary | ICD-10-CM

## 2023-06-17 NOTE — Therapy (Signed)
OUTPATIENT PHYSICAL THERAPY NEURO TREATMENT   Patient Name: Bradley Davis MRN: [id number] DOB:09-11-1985, 37 y.o., male Today's Date: 06/17/2023   PCP: Darnelle Going REFERRING PROVIDER: Delle Reining, PA  END OF SESSION:  PT End of Session - 06/17/23 1403     Visit Number 11    Date for PT Re-Evaluation 07/17/23    Progress Note Due on Visit 20    PT Start Time 1318    PT Stop Time 1358    PT Time Calculation (min) 40 min    Activity Tolerance Patient tolerated treatment well    Behavior During Therapy [facility] for tasks assessed/performed                Past Medical History:  Diagnosis Date   CKD stage 3a, GFR 45-59 ml/min (HCC)    Diabetes mellitus    Hypertension    Necrotizing fasciitis (HCC) 04/2021   right groin   Stroke [facility])    Past Surgical History:  Procedure Laterality Date   INCISION AND DRAINAGE ABSCESS Right 04/21/2021   Procedure: INCISION, DRAINAGE, DEBRIDEMENT OF RIGHT THIGH/GROIN INFECTION;  Surgeon: Abigail Miyamoto, MD;  Location: [facility];  Service: General;  Laterality: Right;   IRRIGATION AND DEBRIDEMENT ABSCESS Right 04/23/2021   Procedure: IRRIGATION AND DEBRIDEMENT RIGHT GROIN WOUND; RIGHT THIGH DRESSING CHANGE;  Surgeon: Abigail Miyamoto, MD;  Location: [facility];  Service: General;  Laterality: Right;   IRRIGATION AND DEBRIDEMENT ABSCESS Right 04/26/2021   Procedure: reexploration and debridement of soft tissue infection right groin;  Surgeon: Emelia Loron, MD;  Location: [facility];  Service: General;  Laterality: Right;   RETINAL DETACHMENT SURGERY  2022   TONSILLECTOMY  2003   Patient Active Problem List   Diagnosis Date Noted   Constipation 03/18/2023   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   OSA (obstructive sleep apnea) 03/06/2023   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   Intractable nausea and vomiting 02/28/2023   Acute renal failure superimposed on stage 3a chronic kidney disease (HCC) 02/27/2023   DM (diabetes mellitus), type 1 with  renal complications (HCC) 04/23/2021   Essential hypertension 04/23/2021   Necrotizing fasciitis (HCC) 04/22/2021    ONSET DATE: 02/09/23   REFERRING DIAG: cerebellar stroke  THERAPY DIAG:  Muscle weakness (generalized)  Decreased activity tolerance  Unsteadiness on feet  Other abnormalities of gait and mobility  Cerebellar stroke (HCC)  Other lack of coordination  Rationale for Evaluation and Treatment: Rehabilitation  SUBJECTIVE:  SUBJECTIVE STATEMENT: Reports B LE edema and cramping for last couple of days.  Plans to go to [place] immediately following today's PT session Pt accompanied by: self  PERTINENT HISTORY: cerebellar stroke also renal disease, hospitalized 8/3/to 9/9/224 for rehab.  Referred to outpt PT  PAIN:  Are you having pain? No  PRECAUTIONS: Other: BP  RED FLAGS: None   WEIGHT BEARING RESTRICTIONS: No  FALLS: Has patient fallen in last 6 months? Yes. Number of falls 4  LIVING ENVIRONMENT: Lives with: lives with their family Lives in: House/apartment Has following equipment at home: Single point cane and Environmental consultant - 4 wheeled  PLOF: Independent  PATIENT GOALS: return to I driving, walking, managing my food truck  OBJECTIVE:  Note: Objective measures were completed at Evaluation unless otherwise noted.  DIAGNOSTIC FINDINGS:copied from hospitalist notes: MRI brain done for workup showing small acute infarct in lateral right medulla and deep right cerebral white matter adjacent to lateral ventricle.   COGNITION: Overall cognitive status: Within functional limits for tasks assessed   SENSATION: WFL  COORDINATION: Alt toe taps, symmetrical toe taps in sitting wnl  EDEMA:  None noted  MUSCLE TONE: all wnl    LOWER EXTREMITY ROM:   all LE ROM wnl  LOWER  EXTREMITY MMT:  burst testing B LE 's wnl  BED MOBILITY: NA today   TRANSFERS: Assistive device utilized: Environmental consultant - 4 wheeled  Sit to stand: Modified independence Stand to sit: Modified independence   GAIT: Gait pattern: lateral lean- Right and wide BOS Distance walked: 80 Assistive device utilized: Walker - 4 wheeled Level of assistance: Modified independence   Monitored BO in lobby,BP 166/106 After walking 40' lobby to clinic: BP 187/115 After walking 80' BP 196/117 Stopped eval at this pt and called PCP to report he was advised to go to urgent care FUNCTIONAL TESTS:  FGA 16/30  PATIENT SURVEYS:    TODAY'S TREATMENT:                                                                                                                              DATE: 06/17/23:   Recumbent cycle for 5 min Leg press 60# 3 x 15, B LE's Knee ext B 10# 3 x 10 Knee flex 35# 3 x 10 Standing on rocker board for/back and side to side Unilateral standing for forward hinging at hips and arms in T position, 5 sec holds each, built up to 10 sec holds   06/10/23:  Abbreviated session, pt 12 min late Recumbent cycle 5 min level 3 FGA In ll bars for rocker board for/back and side/side Unilateral standing, hinging at hip with arms in T position brief holds, multiple reps   06/05/23: Abbreviated session due to  pt 15 min late Leg press B 70#, 3 x 10  In ll bars for rockerboard for/back In ll bars for rockerboard side/side On min tramp 10 to 30 sec intervals of B LE jumps  Side  to side jumps on mini tramp 20 sec x 2 Single leg balancing, then hinge at hips, shoulders at 90 degrees abd, able to perform with opposite toe tapping   05/27/23: Quadriped alt arm and leg lifts 10 x each Squats with 10# with black t band around thighs for cue for hip abductors, 10 # Leg press B 70#, 3 x 10  Hamstring curls, 35# 3 x 10 Knee extension 10# 3 x 10 Standing hip abduction 3# cuff weight 15x Standing hip ext  3# cuff wt 15x Fast travelling laterally over airex, with rollator for balance, fast cadence Alt forward step ups onto airex with opposite knee drivers, 15 x each, rollator for balance, fast cadence   05/22/23 NuStep L5 x 6 minutes. Squats with 10# weight and G tband at knees to engage abductors, 2 x 10 reps Standing in parallel bars, hip abd pushes on Fitter press, 1 black band, 2 x 10 each leg, min VC for correct form Quadruped Bird dogs x 5 each, cat/cow Supine breathing exercises- deep breaths with TC of hands on ribs to fully open ribcage, and then resisted breathing for rib mobs. Patient educated in deep breathing techniques at home to facilitate improved sleep  05/15/23 Gait outside today used the rollator, no rest, around the back building and to the front doot Leg extension 10# 2x10 Leg curls 25# 2x10 On airex 10# straight arm pulls On airex 5# chest press 70# leg press 2 x10 Lats 25# 2x10 5# hip extension and abduction very weak and very tired with the abduction had to have rest breaks 35# triceps 10# biceps  05/13/23 Gait outside with SBA without device around the back building with no rest, good pace, SOB and some wobbly legs at the end 10# straight arm extension 2x10 Leg press 70# 3x10 Resisted walking 50# all directions SLS  Gait outside no device SBA around the parking island in the back BP after this 172/94,, after 2 minutes rest 174/95  05/09/23 Nustep level 5 x 5 minutes Gait outside around the back parking [name] and then to the front door with Rollator and SBA 35# leg curls 2x10 Leg extension 10# 2x10 Leg press 70# 2x10 Calf press 70# 2x10 In pbars side stepping on airex balance beam, then tandem walk On airex ball toss On airex eyes closed On bosu upside down balance Sit to stand weighted ball 6.6# overhead reach Walking ball toss  05/08/23 Bike L3.5 x 4 minutes, 30 sec fast pedalling, RHR/SATS 93/97, EHR/SATS 98/97 Squats with 6# ball x 10 Side  lunge with 6# ball x 10 each way to challenge both strength and balance Patient developed R piriformis spasm Lower and upper body stretching to relieve spasm and improve his sleep-lower traps stretch, figure 4 in sit, glut stretch in sit, HS stretch, deep STM with tennis ball Educated to deep breathing techniques to improve his breathing and promote improved relaxation before sleep.  04/29/23:  Completed FGA: 16/30 Mini jumps B LE's 30 sec in ll bars Jogging in ll bars 30 sec  Large step with reaches across trunk to target on wall behind  Dribbling 65 cm green physioball x 40' L hand, 40' R hand Wall ladder climbs 15 x each side, added horizontal head turns Corner for semitandem standing for horizontal head turns. Leg press 70# B LE's  Leg press 70# B ankle plantarflexors  04/24/23   PATIENT EDUCATION: Education details: POC, goals Person educated: Patient Education method: Explanation, Demonstration, Tactile cues, and Verbal cues  Education comprehension: verbalized understanding  HOME EXERCISE PROGRAM:  [id number]  GOALS: Goals reviewed with patient? Yes  SHORT TERM GOALS: Target date: 2 weeks 05/08/23  IHEP  Baseline:TBD Goal status: 05/08/23 Provided, ongoing   LONG TERM GOALS: Target date: 07/17/23  Able to complete FGA and score 30/30 Baseline: TBA 04/29/23: FGA 16/30, 06/10/23: 24/30 Goal status: progressing  2.  Able to walk greater than 1000' maintaining pace of 1 m/sec for consistent and efficient community ambulation Baseline: progressing 05/15/23 Goal status: 12/2/24progressing  3.  Sit to stand 30 sec, 15 sec or more to demonstrate endurance, coordination, functional strength Baseline: TBD Goal status: 05/22/23-ongoing 06/10/23: ongoing  ASSESSMENT:  CLINICAL IMPRESSION: Today was Mosi's 11th visit following a cerebellar CVA.  He has improved greatly since his initial evaluation.  Today recent onset of edema B LE's, diffuse, of unknown origin.  Did not  push the more difficult and taxing activities today due to this edema.  Continued to address  balance, Will benefit from ongoing skilled PT to address his recovery. He is to go immediately after today's visit to PCP for eval of edema B LE's. He will follow up in the next couple of weeks with neurologist. OBJECTIVE IMPAIRMENTS: Abnormal gait, cardiopulmonary status limiting activity, decreased balance, and decreased coordination.   ACTIVITY LIMITATIONS: carrying, lifting, standing, squatting, stairs, locomotion level, and caring for others  PARTICIPATION LIMITATIONS: meal prep, cleaning, laundry, driving, occupation, and yard work  PERSONAL FACTORS: Behavior pattern, Past/current experiences, and Time since onset of injury/illness/exacerbation are also affecting patient's functional outcome.   REHAB POTENTIAL: Good  CLINICAL DECISION MAKING: Evolving/moderate complexity  EVALUATION COMPLEXITY: Moderate  PLAN:  PT FREQUENCY: 2x/week  PT DURATION: 12 weeks  PLANNED INTERVENTIONS: 97110-Therapeutic exercises, 97530- Therapeutic activity, [id number]- Neuromuscular re-education, 97535- Self Care, and [id number]- Manual therapy  PLAN FOR NEXT SESSION:continue to progress strength and function  Kassia Demarinis, , PT, DPT, OCS 06/17/2023, 2:15 PM

## 2023-06-24 ENCOUNTER — Encounter: Payer: Self-pay | Admitting: Physical Medicine & Rehabilitation

## 2023-06-24 ENCOUNTER — Ambulatory Visit: Payer: Medicaid Other | Admitting: Physical Therapy

## 2023-06-24 ENCOUNTER — Encounter: Payer: Self-pay | Admitting: Physical Therapy

## 2023-06-24 DIAGNOSIS — R278 Other lack of coordination: Secondary | ICD-10-CM

## 2023-06-24 DIAGNOSIS — R6889 Other general symptoms and signs: Secondary | ICD-10-CM

## 2023-06-24 DIAGNOSIS — I639 Cerebral infarction, unspecified: Secondary | ICD-10-CM

## 2023-06-24 DIAGNOSIS — R2681 Unsteadiness on feet: Secondary | ICD-10-CM

## 2023-06-24 DIAGNOSIS — M6281 Muscle weakness (generalized): Secondary | ICD-10-CM | POA: Diagnosis not present

## 2023-06-24 DIAGNOSIS — R2689 Other abnormalities of gait and mobility: Secondary | ICD-10-CM

## 2023-06-24 NOTE — Therapy (Signed)
OUTPATIENT PHYSICAL THERAPY NEURO TREATMENT   Patient Name: Bradley Davis MRN: [id number] DOB:05/11/1986, 37 y.o., male Today's Date: 06/24/2023   PCP: Darnelle Going REFERRING PROVIDER: Delle Reining, PA  END OF SESSION:  PT End of Session - 06/24/23 1324     Visit Number 12    Date for PT Re-Evaluation 07/17/23    PT Start Time 1320    PT Stop Time 1355    PT Time Calculation (min) 35 min    Activity Tolerance Patient tolerated treatment well    Behavior During Therapy [facility] for tasks assessed/performed            Past Medical History:  Diagnosis Date   CKD stage 3a, GFR 45-59 ml/min (HCC)    Diabetes mellitus    Hypertension    Necrotizing fasciitis (HCC) 04/2021   right groin   Stroke [facility])    Past Surgical History:  Procedure Laterality Date   INCISION AND DRAINAGE ABSCESS Right 04/21/2021   Procedure: INCISION, DRAINAGE, DEBRIDEMENT OF RIGHT THIGH/GROIN INFECTION;  Surgeon: Abigail Miyamoto, MD;  Location: [facility];  Service: General;  Laterality: Right;   IRRIGATION AND DEBRIDEMENT ABSCESS Right 04/23/2021   Procedure: IRRIGATION AND DEBRIDEMENT RIGHT GROIN WOUND; RIGHT THIGH DRESSING CHANGE;  Surgeon: Abigail Miyamoto, MD;  Location: [facility];  Service: General;  Laterality: Right;   IRRIGATION AND DEBRIDEMENT ABSCESS Right 04/26/2021   Procedure: reexploration and debridement of soft tissue infection right groin;  Surgeon: Emelia Loron, MD;  Location: [facility];  Service: General;  Laterality: Right;   RETINAL DETACHMENT SURGERY  2022   TONSILLECTOMY  2003   Patient Active Problem List   Diagnosis Date Noted   Constipation 03/18/2023   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   OSA (obstructive sleep apnea) 03/06/2023   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   Intractable nausea and vomiting 02/28/2023   Acute renal failure superimposed on stage 3a chronic kidney disease (HCC) 02/27/2023   DM (diabetes mellitus), 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    Essential hypertension 04/23/2021   Necrotizing fasciitis (HCC) 04/22/2021    ONSET DATE: 02/09/23   REFERRING DIAG: cerebellar stroke  THERAPY DIAG:  Muscle weakness (generalized)  Decreased activity tolerance  Unsteadiness on feet  Other abnormalities of gait and mobility  Cerebellar stroke (HCC)  Other lack of coordination  Rationale for Evaluation and Treatment: Rehabilitation  SUBJECTIVE:                                                                                                                                                                                             SUBJECTIVE STATEMENT: Reports B LE  edema and cramping for last couple of days.  Saw  the Dr who recommended compression hose for swelling. He feels he still gets out of breath easily and his legs feel like they always want to cramp.  Pt accompanied by: self  PERTINENT HISTORY: cerebellar stroke also renal disease, hospitalized 8/3/to 9/9/224 for rehab.  Referred to outpt PT  PAIN:  Are you having pain? No  PRECAUTIONS: Other: BP  RED FLAGS: None   WEIGHT BEARING RESTRICTIONS: No  FALLS: Has patient fallen in last 6 months? Yes. Number of falls 4  LIVING ENVIRONMENT: Lives with: lives with their family Lives in: House/apartment Has following equipment at home: Single point cane and Environmental consultant - 4 wheeled  PLOF: Independent  PATIENT GOALS: return to I driving, walking, managing my food truck  OBJECTIVE:  Note: Objective measures were completed at Evaluation unless otherwise noted.  DIAGNOSTIC FINDINGS:copied from hospitalist notes: MRI brain done for workup showing small acute infarct in lateral right medulla and deep right cerebral white matter adjacent to lateral ventricle.   COGNITION: Overall cognitive status: Within functional limits for tasks assessed   SENSATION: WFL  COORDINATION: Alt toe taps, symmetrical toe taps in sitting wnl  EDEMA:  None noted  MUSCLE TONE: all  wnl    LOWER EXTREMITY ROM:   all LE ROM wnl  LOWER EXTREMITY MMT:  burst testing B LE 's wnl  BED MOBILITY: NA today   TRANSFERS: Assistive device utilized: Environmental consultant - 4 wheeled  Sit to stand: Modified independence Stand to sit: Modified independence   GAIT: Gait pattern: lateral lean- Right and wide BOS Distance walked: 80 Assistive device utilized: Walker - 4 wheeled Level of assistance: Modified independence   Monitored BO in lobby,BP 166/106 After walking 40' lobby to clinic: BP 187/115 After walking 80' BP 196/117 Stopped eval at this pt and called PCP to report he was advised to go to urgent care FUNCTIONAL TESTS:  FGA 16/30  PATIENT SURVEYS:    TODAY'S TREATMENT:                                                                                                                              DATE: 06/24/23 Bike L3 x 6 min Functional status re-assessed for insurance re-cert B hip flexor stretch in prone on the mat, press ups. Side to side steps in min squat position, 8 reps each way, x 3 Side steps over airex planks, increased pace to hops in each direction Jumping side to side over airex plank on floor, BUE support on parallel bars, 10 reps each way. Step ups onto 6" step fast. X 10 leading with each leg. Back to bike, 2 x 30 sec fast pedal. Able to maintain 78 RPM, but had cramping in L calf.  06/17/23:   Recumbent cycle for 5 min Leg press 60# 3 x 15, B LE's Knee ext B 10# 3 x 10 Knee flex 35# 3 x 10 Standing on  rocker board for/back and side to side Unilateral standing for forward hinging at hips and arms in T position, 5 sec holds each, built up to 10 sec holds   06/10/23:  Abbreviated session, pt 12 min late Recumbent cycle 5 min level 3 FGA In ll bars for rocker board for/back and side/side Unilateral standing, hinging at hip with arms in T position brief holds, multiple reps   06/05/23: Abbreviated session due to  pt 15 min late Leg press B 70#, 3  x 10  In ll bars for rockerboard for/back In ll bars for rockerboard side/side On min tramp 10 to 30 sec intervals of B LE jumps  Side to side jumps on mini tramp 20 sec x 2 Single leg balancing, then hinge at hips, shoulders at 90 degrees abd, able to perform with opposite toe tapping   05/27/23: Quadriped alt arm and leg lifts 10 x each Squats with 10# with black t band around thighs for cue for hip abductors, 10 # Leg press B 70#, 3 x 10  Hamstring curls, 35# 3 x 10 Knee extension 10# 3 x 10 Standing hip abduction 3# cuff weight 15x Standing hip ext 3# cuff wt 15x Fast travelling laterally over airex, with rollator for balance, fast cadence Alt forward step ups onto airex with opposite knee drivers, 15 x each, rollator for balance, fast cadence   05/22/23 NuStep L5 x 6 minutes. Squats with 10# weight and G tband at knees to engage abductors, 2 x 10 reps Standing in parallel bars, hip abd pushes on Fitter press, 1 black band, 2 x 10 each leg, min VC for correct form Quadruped Bird dogs x 5 each, cat/cow Supine breathing exercises- deep breaths with TC of hands on ribs to fully open ribcage, and then resisted breathing for rib mobs. Patient educated in deep breathing techniques at home to facilitate improved sleep  05/15/23 Gait outside today used the rollator, no rest, around the back building and to the front doot Leg extension 10# 2x10 Leg curls 25# 2x10 On airex 10# straight arm pulls On airex 5# chest press 70# leg press 2 x10 Lats 25# 2x10 5# hip extension and abduction very weak and very tired with the abduction had to have rest breaks 35# triceps 10# biceps  05/13/23 Gait outside with SBA without device around the back building with no rest, good pace, SOB and some wobbly legs at the end 10# straight arm extension 2x10 Leg press 70# 3x10 Resisted walking 50# all directions SLS  Gait outside no device SBA around the parking island in the back BP after this 172/94,,  after 2 minutes rest 174/95  05/09/23 Nustep level 5 x 5 minutes Gait outside around the back parking [name] and then to the front door with Rollator and SBA 35# leg curls 2x10 Leg extension 10# 2x10 Leg press 70# 2x10 Calf press 70# 2x10 In pbars side stepping on airex balance beam, then tandem walk On airex ball toss On airex eyes closed On bosu upside down balance Sit to stand weighted ball 6.6# overhead reach Walking ball toss  05/08/23 Bike L3.5 x 4 minutes, 30 sec fast pedalling, RHR/SATS 93/97, EHR/SATS 98/97 Squats with 6# ball x 10 Side lunge with 6# ball x 10 each way to challenge both strength and balance Patient developed R piriformis spasm Lower and upper body stretching to relieve spasm and improve his sleep-lower traps stretch, figure 4 in sit, glut stretch in sit, HS stretch, deep STM with tennis  ball Educated to deep breathing techniques to improve his breathing and promote improved relaxation before sleep.  04/29/23:  Completed FGA: 16/30 Mini jumps B LE's 30 sec in ll bars Jogging in ll bars 30 sec  Large step with reaches across trunk to target on wall behind  Dribbling 65 cm green physioball x 40' L hand, 40' R hand Wall ladder climbs 15 x each side, added horizontal head turns Corner for semitandem standing for horizontal head turns. Leg press 70# B LE's  Leg press 70# B ankle plantarflexors  04/24/23   PATIENT EDUCATION: Education details: POC, goals Person educated: Patient Education method: Explanation, Demonstration, Tactile cues, and Verbal cues Education comprehension: verbalized understanding  HOME EXERCISE PROGRAM:  [id number]  GOALS: Goals reviewed with patient? Yes  SHORT TERM GOALS: Target date: 2 weeks 05/08/23  IHEP  Baseline:TBD Goal status: 05/08/23 Provided, ongoing   LONG TERM GOALS: Target date: 07/17/23  Able to complete FGA and score 30/30 Baseline: TBA 04/29/23: FGA 16/30, 06/10/23: 24/30 Goal status: progressing  2.   Able to walk greater than 1000' maintaining pace of 1 m/sec for consistent and efficient community ambulation Baseline: progressing 05/15/23 Goal status: 12/2/24progressing  3.  Sit to stand 30 sec, 15 sec or more to demonstrate endurance, coordination, functional strength Baseline: TBD Goal status: 05/22/23-ongoing 06/20/23: 17, met  ASSESSMENT:  CLINICAL IMPRESSION: Patient reports cramp/spasm in L calf. His status was re-assessed for insurance update. He is making progress toward all LTG as well as functionally. He had to return to work and has struggled with this due to fatigue and weakness, but notes his tolerance is improving. He has developed cramping in his L leg, difficult to to determine the exact cause, may be a combination of neuropathy, weakness from his decreased activity after his stroke, and also overuse of the L side as his R side was actually the one affected by the stroke. Treatment is progressing with him, challenging muscular endurance, balance, activity tolerance as he gets SOB and also overall fatigue fairly easily. He needs to continue therapy in order to improve his ability to return to work full time with decreased pain, decreased fall risk.  OBJECTIVE IMPAIRMENTS: Abnormal gait, cardiopulmonary status limiting activity, decreased balance, and decreased coordination.   ACTIVITY LIMITATIONS: carrying, lifting, standing, squatting, stairs, locomotion level, and caring for others  PARTICIPATION LIMITATIONS: meal prep, cleaning, laundry, driving, occupation, and yard work  PERSONAL FACTORS: Behavior pattern, Past/current experiences, and Time since onset of injury/illness/exacerbation are also affecting patient's functional outcome.   REHAB POTENTIAL: Good  CLINICAL DECISION MAKING: Evolving/moderate complexity  EVALUATION COMPLEXITY: Moderate  PLAN:  PT FREQUENCY: 2x/week  PT DURATION: 12 weeks  PLANNED INTERVENTIONS: 97110-Therapeutic exercises, 97530-  Therapeutic activity, [id number]- Neuromuscular re-education, 97535- Self Care, and [id number]- Manual therapy  PLAN FOR NEXT SESSION:continue to progress strength and function  Oley Balm DPT 06/24/23 2:38 PM

## 2023-07-05 ENCOUNTER — Other Ambulatory Visit: Payer: Self-pay

## 2023-07-05 ENCOUNTER — Emergency Department (HOSPITAL_BASED_OUTPATIENT_CLINIC_OR_DEPARTMENT_OTHER)
Admission: EM | Admit: 2023-07-05 | Discharge: 2023-07-05 | Disposition: A | Discharge status: Home / Self Care | Payer: Medicaid Other | Attending: Emergency Medicine | Admitting: Emergency Medicine

## 2023-07-05 ENCOUNTER — Emergency Department (HOSPITAL_BASED_OUTPATIENT_CLINIC_OR_DEPARTMENT_OTHER): Payer: Medicaid Other | Admitting: Radiology

## 2023-07-05 DIAGNOSIS — E109 Type 1 diabetes mellitus without complications: Secondary | ICD-10-CM | POA: Insufficient documentation

## 2023-07-05 DIAGNOSIS — Z20822 Contact with and (suspected) exposure to covid-19: Secondary | ICD-10-CM | POA: Diagnosis not present

## 2023-07-05 DIAGNOSIS — Z79899 Other long term (current) drug therapy: Secondary | ICD-10-CM | POA: Insufficient documentation

## 2023-07-05 DIAGNOSIS — Z7901 Long term (current) use of anticoagulants: Secondary | ICD-10-CM | POA: Insufficient documentation

## 2023-07-05 DIAGNOSIS — I129 Hypertensive chronic kidney disease with stage 1 through stage 4 chronic kidney disease, or unspecified chronic kidney disease: Secondary | ICD-10-CM | POA: Diagnosis not present

## 2023-07-05 DIAGNOSIS — J189 Pneumonia, unspecified organism: Secondary | ICD-10-CM | POA: Insufficient documentation

## 2023-07-05 DIAGNOSIS — R059 Cough, unspecified: Secondary | ICD-10-CM | POA: Diagnosis present

## 2023-07-05 DIAGNOSIS — Z794 Long term (current) use of insulin: Secondary | ICD-10-CM | POA: Insufficient documentation

## 2023-07-05 DIAGNOSIS — N189 Chronic kidney disease, unspecified: Secondary | ICD-10-CM | POA: Insufficient documentation

## 2023-07-05 LAB — BASIC METABOLIC PANEL
Anion gap: 8 (ref 5–15)
BUN: 34 mg/dL — ABNORMAL HIGH (ref 6–20)
CO2: 21 mmol/L — ABNORMAL LOW (ref 22–32)
Calcium: 8.9 mg/dL (ref 8.9–10.3)
Chloride: 112 mmol/L — ABNORMAL HIGH (ref 98–111)
Creatinine, Ser: 3.65 mg/dL — ABNORMAL HIGH (ref 0.61–1.24)
GFR, Estimated: 21 mL/min — ABNORMAL LOW (ref >60)
Glucose, Bld: 74 mg/dL (ref 70–99)
Potassium: 4 mmol/L (ref 3.5–5.1)
Sodium: 141 mmol/L (ref 135–145)

## 2023-07-05 LAB — CBC
HCT: 34.2 % — ABNORMAL LOW (ref 39.0–52.0)
Hemoglobin: 11.2 g/dL — ABNORMAL LOW (ref 13.0–17.0)
MCH: 27.9 pg (ref 26.0–34.0)
MCHC: 32.7 g/dL (ref 30.0–36.0)
MCV: 85.3 fL (ref 80.0–100.0)
Platelets: 386 10*3/uL (ref 150–400)
RBC: 4.01 MIL/uL — ABNORMAL LOW (ref 4.22–5.81)
RDW: 13.9 % (ref 11.5–15.5)
WBC: 8.7 10*3/uL (ref 4.0–10.5)
nRBC: 0 % (ref 0.0–0.2)

## 2023-07-05 LAB — RESP PANEL BY RT-PCR (RSV, FLU A&B, COVID)  RVPGX2
Influenza A by PCR: NEGATIVE
Influenza B by PCR: NEGATIVE
Resp Syncytial Virus by PCR: NEGATIVE
SARS Coronavirus 2 by RT PCR: NEGATIVE

## 2023-07-05 LAB — CBG MONITORING, ED
Glucose-Capillary: 66 mg/dL — ABNORMAL LOW (ref 70–99)
Glucose-Capillary: 69 mg/dL — ABNORMAL LOW (ref 70–99)

## 2023-07-05 MED ORDER — AMOXICILLIN-POT CLAVULANATE 875-125 MG PO TABS: 1.0000 | ORAL_TABLET | Freq: Two times a day (BID) | ORAL | 0 refills | Status: DC

## 2023-07-05 MED ORDER — DOXYCYCLINE HYCLATE 100 MG PO CAPS: 100.0000 mg | ORAL_CAPSULE | Freq: Two times a day (BID) | ORAL | 0 refills | Status: AC

## 2023-07-05 MED ORDER — ALBUTEROL SULFATE HFA 108 (90 BASE) MCG/ACT IN AERS: 1.0000 | INHALATION_SPRAY | Freq: Four times a day (QID) | RESPIRATORY_TRACT | 0 refills | Status: AC | PRN

## 2023-07-05 MED ORDER — ACETAMINOPHEN 325 MG PO TABS
975.0000 mg | ORAL_TABLET | Freq: Once | ORAL | Status: AC
  Administered 2023-07-05: 975 mg via ORAL
  Filled 2023-07-05: qty 3

## 2023-07-05 MED ORDER — ALBUTEROL SULFATE HFA 108 (90 BASE) MCG/ACT IN AERS: 1.0000 | INHALATION_SPRAY | Freq: Four times a day (QID) | RESPIRATORY_TRACT | 0 refills | Status: DC | PRN

## 2023-07-05 MED ORDER — AMOXICILLIN-POT CLAVULANATE 875-125 MG PO TABS
1.0000 | ORAL_TABLET | Freq: Once | ORAL | Status: AC
  Administered 2023-07-05: 1 via ORAL
  Filled 2023-07-05: qty 1

## 2023-07-05 MED ORDER — DOXYCYCLINE HYCLATE 100 MG PO CAPS: 100.0000 mg | ORAL_CAPSULE | Freq: Two times a day (BID) | ORAL | 0 refills | Status: DC

## 2023-07-05 MED ORDER — ALBUTEROL SULFATE (2.5 MG/3ML) 0.083% IN NEBU
2.5000 mg | INHALATION_SOLUTION | Freq: Once | RESPIRATORY_TRACT | Status: AC
  Administered 2023-07-05: 2.5 mg via RESPIRATORY_TRACT
  Filled 2023-07-05: qty 3

## 2023-07-05 MED ORDER — DOXYCYCLINE HYCLATE 100 MG PO TABS
100.0000 mg | ORAL_TABLET | Freq: Once | ORAL | Status: AC
  Administered 2023-07-05: 100 mg via ORAL
  Filled 2023-07-05: qty 1

## 2023-07-05 NOTE — ED Notes (Signed)
CBG 65 in triage. Given snack while waiting.

## 2023-07-05 NOTE — ED Triage Notes (Signed)
SOB starting last night. Nasal congestion. Denies chills, body aches, -N/-V-D, -CP.  HX HTN and Stroke-02/2023-no thinners, diabetes.

## 2023-07-05 NOTE — ED Provider Notes (Signed)
[place] EMERGENCY DEPARTMENT AT [facility] Provider Note   CSN: [id number] Arrival date & time: 07/05/23  1551     History {Add pertinent medical, surgical, social history, OB history to HPI:1} Chief Complaint  Patient presents with   Nasal Congestion    Bradley Davis is a 37 y.o. male with past medical history of T1DM, hypertension, CKD, CVA (August 2024), OSA presents to emergency department for evaluation of nonproductive cough, congestion, chills that started last night.  He denies sick contacts.  Of note he was evaluated in ED on 05/28/2023 for pneumonia.  He reports that he had full resolution following antibiotic treatment prior to this episode of symptoms.  HPI     Home Medications Prior to Admission medications   Medication Sig Start Date End Date Taking? Authorizing Provider  acetaminophen (TYLENOL) 325 MG tablet Take 650 mg by mouth as needed for mild pain or headache.    [provider]  albuterol (VENTOLIN HFA) 108 (90 Base) MCG/ACT inhaler Inhale 1-2 puffs into the lungs every 6 (six) hours as needed for wheezing or shortness of breath. 07/05/23   Judithann Sheen, PA  amLODipine (NORVASC) 10 MG tablet Take 1 tablet by mouth daily. 04/26/23   [provider]  amoxicillin-clavulanate (AUGMENTIN) 875-125 MG tablet Take 1 tablet by mouth every 12 (twelve) hours. 07/05/23   Judithann Sheen, PA  atorvastatin (LIPITOR) 80 MG tablet Take 1 tablet by mouth daily. 04/26/23   [provider]  azithromycin (ZITHROMAX) 250 MG tablet Take 1 tablet (250 mg total) by mouth daily. Take first 2 tablets together, then 1 every day until finished. Patient not taking: Reported on 06/04/2023 05/28/23   Glyn Ade, MD  benazepril (LOTENSIN) 10 MG tablet Take 1 tablet by mouth daily. Patient not taking: Reported on 05/30/2023 04/26/23 04/25/24  [provider]  benazepril (LOTENSIN) 20 MG tablet Take 1 tablet by mouth daily. 05/17/23    [provider]  cloNIDine (CATAPRES - DOSED IN MG/24 HR) 0.1 mg/24hr patch Place 1 patch (0.1 mg total) onto the skin once a week. Change every Friday 03/22/23   Jacquelynn Cree, PA-C  cloNIDine (CATAPRES - DOSED IN MG/24 HR) 0.1 mg/24hr patch Place onto the skin. 04/26/23   [provider]  clopidogrel (PLAVIX) 75 MG tablet Take 1 tablet (75 mg total) by mouth daily. Patient not taking: Reported on 05/30/2023 03/18/23   Love, Evlyn Kanner, PA-C  Continuous Glucose Sensor (DEXCOM G7 SENSOR) MISC Use 1 sensor every 10 days. 03/18/23   [provider]  [facility] ASPIRIN EC 81 MG tablet SMARTSIG:1 Tablet(s) By Mouth Daily 03/18/23   [provider]  doxycycline (VIBRAMYCIN) 100 MG capsule Take 1 capsule (100 mg total) by mouth 2 (two) times daily for 5 days. 07/05/23 07/10/23  Judithann Sheen, PA  ezetimibe (ZETIA) 10 MG tablet Take 1 tablet by mouth daily. 05/20/23 05/19/24  [provider]  hydrALAZINE (APRESOLINE) 100 MG tablet Take 1 tablet (100 mg total) by mouth every 8 (eight) hours. 04/24/23 06/04/23  Arby Barrette, MD  insulin aspart (NOVOLOG) 100 UNIT/ML injection USE AS DIRECTED IN INSULIN PUMP. MAX DAILY DOSE IS 150 UNITS Patient not taking: Reported on 06/04/2023 03/18/23   [provider]  Insulin Disposable Pump (OMNIPOD 5 G6 INTRO, GEN 5,) KIT Use for insulin administration.  Patient will be using Dexcom G7. 03/19/23   [provider]  Insulin Disposable Pump (OMNIPOD 5 G6 PODS, GEN 5,) MISC Use one pod  every 72 hours for insulin administration.  Patient will be using Dexcom G7 CGM. 03/19/23   [provider]  Insulin Glargine (BASAGLAR KWIKPEN) 100 UNIT/ML Inject 25 Units into the skin 2 (two) times daily. 03/18/23   Love, Evlyn Kanner, PA-C  labetalol (NORMODYNE) 300 MG tablet Take by mouth. 04/26/23   [provider]  pantoprazole (PROTONIX) 40 MG tablet Take 1 tablet (40 mg total) by mouth daily. 03/19/23   Love, Evlyn Kanner, PA-C   senna (SENOKOT) 8.6 MG TABS tablet Take 1 tablet (8.6 mg total) by mouth 2 (two) times daily. 03/18/23   Love, Evlyn Kanner, PA-C  sodium bicarbonate 650 MG tablet Take 1 tablet (650 mg total) by mouth 3 (three) times daily. 03/18/23   Love, Evlyn Kanner, PA-C      Allergies    Ibuprofen    Review of Systems   Review of Systems  Constitutional:  Negative for chills, fatigue and fever.  Respiratory:  Negative for cough, chest tightness, shortness of breath and wheezing.   Cardiovascular:  Negative for chest pain and palpitations.  Gastrointestinal:  Negative for abdominal pain, constipation, diarrhea, nausea and vomiting.  Neurological:  Negative for dizziness, seizures, weakness, light-headedness, numbness and headaches.    Physical Exam Updated Vital Signs BP (!) 192/94 (BP Location: Right Arm)   Pulse (!) 102   Temp (!) 102.7 F (39.3 C) (Oral)   Resp 18   SpO2 93%  Physical Exam Vitals and nursing note reviewed.  Constitutional:      General: He is not in acute distress.    Appearance: Normal appearance. He is not ill-appearing.  HENT:     Head: Normocephalic and atraumatic.     Right Ear: Tympanic membrane, ear canal and external ear normal.     Left Ear: Tympanic membrane, ear canal and external ear normal.     Nose: Congestion present.     Mouth/Throat:     Mouth: Mucous membranes are moist.  Eyes:     General: No scleral icterus.       Right eye: No discharge.        Left eye: No discharge.     Conjunctiva/sclera: Conjunctivae normal.  Cardiovascular:     Rate and Rhythm: Normal rate.  Pulmonary:     Effort: Pulmonary effort is normal. No respiratory distress.     Breath sounds: Examination of the right-middle field reveals rhonchi. Examination of the left-middle field reveals rhonchi. Rhonchi present.  Abdominal:     General: Bowel sounds are normal. There is no distension.     Palpations: Abdomen is soft.     Tenderness: There is no abdominal tenderness. There is no  guarding or rebound.  Musculoskeletal:     Cervical back: Normal range of motion and neck supple. No rigidity or tenderness.     Right lower leg: 1+ Edema present.     Left lower leg: 1+ Edema present.  Skin:    General: Skin is warm.     Capillary Refill: Capillary refill takes less than 2 seconds.     Coloration: Skin is not jaundiced or pale.  Neurological:     Mental Status: He is alert and oriented to person, place, and time. Mental status is at baseline.     ED Results / Procedures / Treatments   Labs (all labs ordered are listed, but only abnormal results are displayed) Labs Reviewed  CBC - Abnormal; Notable for the following components:      Result Value  RBC 4.01 (*)    Hemoglobin 11.2 (*)    HCT 34.2 (*)    All other components within normal limits  BASIC METABOLIC PANEL - Abnormal; Notable for the following components:   Chloride 112 (*)    CO2 21 (*)    BUN 34 (*)    Creatinine, Ser 3.65 (*)    GFR, Estimated 21 (*)    All other components within normal limits  CBG MONITORING, ED - Abnormal; Notable for the following components:   Glucose-Capillary 69 (*)    All other components within normal limits  CBG MONITORING, ED - Abnormal; Notable for the following components:   Glucose-Capillary 66 (*)    All other components within normal limits  RESP PANEL BY RT-PCR (RSV, FLU A&B, COVID)  [id number]    EKG None  Radiology DG Chest 2 View Result Date: 07/05/2023 CLINICAL DATA:  cough, sob EXAM: CHEST - 2 VIEW COMPARISON:  Chest radiograph 05/28/23 FINDINGS: No pleural effusion. No pneumothorax. Normal cardiac and mediastinal contours. Hazy retrocardiac opacity is worrisome for infection. No radiographically apparent displaced rib fractures. Visualized upper abdomen unremarkable. Vertebral body heights are maintained. IMPRESSION: Hazy retrocardiac opacity is worrisome for infection. Electronically Signed   By: Lorenza Cambridge M.D.   On: 07/05/2023 20:42     Procedures Procedures  {Document cardiac monitor, telemetry assessment procedure when appropriate:1}  Medications Ordered in ED Medications  albuterol (PROVENTIL) (2.5 MG/3ML) 0.083% nebulizer solution 2.5 mg (2.5 mg Nebulization Given 07/05/23 2045)  doxycycline (VIBRA-TABS) tablet 100 mg (100 mg Oral Given 07/05/23 2141)  amoxicillin-clavulanate (AUGMENTIN) 875-125 MG per tablet 1 tablet (1 tablet Oral Given 07/05/23 2141)  acetaminophen (TYLENOL) tablet 975 mg (975 mg Oral Given 07/05/23 2141)    ED Course/ Medical Decision Making/ A&P Clinical Course as of 07/05/23 2338  Fri Jul 05, 2023  2154 Temp(!): 102.7 F (39.3 C) Due to PNA. Provided antibiotic course and Tylenol in ED prior to DC as well as a prescription. Following Tylenol temp decreased to 100F Patient is due to take his HTN medications and also elevated due to infection [LB]    Clinical Course User Index [LB] Judithann Sheen, PA   {   Click here for ABCD2, HEART and other calculatorsREFRESH Note before signing :1}                              Medical Decision Making Amount and/or Complexity of Data Reviewed Labs: ordered. Radiology: ordered.  Risk OTC drugs. Prescription drug management.   Patient presents to the ED for concern of nonproductive cough, congestion, chills, this involves an extensive number of treatment options, and is a complaint that carries with it a high risk of complications and morbidity.  The differential diagnosis includes URI, COVID, pneumonia, allergies   Co morbidities that complicate the patient evaluation   T1DM, hypertension, CKD, CVA (August 2024), OSA   Additional history obtained:  Additional history obtained from [facility], Nursing, and Outside Medical Records   External records from outside source obtained and reviewed including  Wife at bedside and triage RN note Most recent ED visit from 05/28/2023 when last diagnosed with pneumonia Provided azithromycin and  amoxicillin   Lab Tests:  I Ordered, and personally interpreted labs.  The pertinent results include:   Creatinine 3.65 (baseline 2.88-3.36 over past 3 months) BUN 34 (baseline is 19-29 over past 3 months) Hemoglobin 11.2 (1 month ago hemoglobin was 11.2.  Baseline  11.2-13.6) CBG 66 -had patient eat   Imaging Studies ordered:  I ordered imaging studies including chest x-ray I independently visualized and interpreted imaging which showed hazy retrocardiac opacity concerning for pneumonia I agree with the radiologist interpretation   Cardiac Monitoring:  The patient was maintained on a cardiac monitor.  I personally viewed and interpreted the cardiac monitored which showed an underlying rhythm of: ***   Medicines ordered and prescription drug management:  I ordered medication including Augmentin and Doxy for CAP  I ordered DuoNeb due to rhonchi and mild complaints of shortness of breath-patient reports improvement following Will provide inhaler prescription I ordered Tylenol for fever, infection  Reevaluation of the patient after these medicines showed that the patient improved I have reviewed the patients home medicines and have made adjustments as needed    Problem List / ED Course:  Nasal congestion Nonproductive cough Developed fever and tachycardia at 102 bpm in ED -provided Tylenol which decreased heart rate and fever Discussed using Tylenol and ibuprofen intermittently every 4 hours at home for myalgia and fever X-ray significant for PNA Will provide Doxy and Augmentin for suspected pneumonia Will have patient follow-up in 1 week at PCP for symptoms Hypertension Patient's most recent blood pressure 180/90 He had not taken his blood pressure medication yet and is likely elevated due to concurrent infection He is followed up by PCP regarding blood pressure and has 3 prescription for blood pressure He does have 1+ pitting edema bilaterally however I think this is due  to chronic lymphedema as it has been noted in previous charts before.  He is also on calcium channel blocker which could be contributing I discussed that patient should follow-up with PCP regarding his blood pressure   Reevaluation:  After the interventions noted above, I reevaluated the patient and found that they have :improved   Social Determinants of Health:  Has PCP follow-up   Dispostion:  After consideration of the diagnostic results and the patients response to treatment, I feel that the patent would benefit from outpatient management with antibiotics.  Discussed return to emergency department precautions with patient expresses understanding present plan.  All questions answered to his satisfaction.  He reports significant improvement of symptoms.  He is agreeable to discharge at this time.   Final Clinical Impression(s) / ED Diagnoses Final diagnoses:  Pneumonia due to infectious organism, unspecified laterality, unspecified part of lung    Rx / DC Orders ED Discharge Orders          Ordered    amoxicillin-clavulanate (AUGMENTIN) 875-125 MG tablet  Every 12 hours,   Status:  Discontinued        07/05/23 2122    doxycycline (VIBRAMYCIN) 100 MG capsule  2 times daily,   Status:  Discontinued        07/05/23 2122    albuterol (VENTOLIN HFA) 108 (90 Base) MCG/ACT inhaler  Every 6 hours PRN,   Status:  Discontinued        07/05/23 2122    albuterol (VENTOLIN HFA) 108 (90 Base) MCG/ACT inhaler  Every 6 hours PRN        07/05/23 2157    amoxicillin-clavulanate (AUGMENTIN) 875-125 MG tablet  Every 12 hours        07/05/23 2157    doxycycline (VIBRAMYCIN) 100 MG capsule  2 times daily        07/05/23 2157

## 2023-07-05 NOTE — Discharge Instructions (Addendum)
You for letting us evaluate you today.  Your x-ray suspicious for pneumonia.  I have given you 1 dose of antibiotics here in the emergency department.  I have also [facility] a [facility] 2 [facility] in [address]  Return to emerged department if you experience worsening symptoms, chest pain, shortness of breath

## 2023-07-05 NOTE — ED Notes (Signed)
Pt. C/o nasal congestion and feels like drainage is going into his chest

## 2023-07-05 NOTE — ED Notes (Signed)
Rn aware of abnormal vital signs.

## 2023-08-01 ENCOUNTER — Other Ambulatory Visit: Payer: Self-pay | Admitting: Neurology

## 2023-08-01 ENCOUNTER — Telehealth: Payer: Self-pay | Admitting: Neurology

## 2023-08-01 DIAGNOSIS — G4733 Obstructive sleep apnea (adult) (pediatric): Secondary | ICD-10-CM

## 2023-08-01 NOTE — Telephone Encounter (Signed)
Referral for sleep studies sent through [facility] to [facility]. [phone]

## 2023-08-08 ENCOUNTER — Other Ambulatory Visit: Payer: Self-pay | Admitting: Physical Medicine and Rehabilitation

## 2023-08-15 ENCOUNTER — Encounter: Payer: Medicaid Other | Attending: Registered Nurse | Admitting: Physical Medicine & Rehabilitation

## 2023-08-15 ENCOUNTER — Encounter: Payer: Self-pay | Admitting: Physical Medicine & Rehabilitation

## 2023-08-15 VITALS — BP 148/81 | HR 63 | Ht 70.0 in | Wt 260.0 lb

## 2023-08-15 DIAGNOSIS — I639 Cerebral infarction, unspecified: Secondary | ICD-10-CM | POA: Diagnosis present

## 2023-08-15 NOTE — Progress Notes (Signed)
 Subjective:    Patient ID: Bradley Davis, male    DOB: 1986-05-24, 38 y.o.   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  He completed inpatient rehabilitation and has ongoing outpatient rehabilitation.  He states his last visits are in December.  He previously worked as a investment banker, operational on a food truck.  He has not worked since his stroke.  He fell while cleaning his food truck outdoors.  A branch hit him in the eye and he fell over.  Since his last physical medicine rehabilitation visit he also underwent treatment for pneumonia in the emergency department 05/28/2023 and in fact had 1 dose of IV antibiotics and is currently on oral antibiotics.  He has followed up with nephrology who have been managing his hypertension as well as chronic kidney disease stage II. Able to cook food for himself , starting to do larger meals, he feels like he can carry hot pans from the oven.  He is working on heavier trays.  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 Back to driving no issues reported   Left hand tingling and right face tingling and still has poor balance HPI Still with tingling of right facial and left hand, balance is still an issue.  He has gone back to work on his food truck but not on a full-time basis yet.  Fortunately he states this is not the busy season for his business. He has had no recent falls He had an episode of shortness of breath and is getting some cardiac workup with one of his other physicians. Pain Inventory Average Pain 5 Pain Right Now 0 My pain is intermittent and tingling  In the last 24 hours, has pain interfered with the following? General activity 0 Relation with others 0 Enjoyment of life 0 What TIME of day is your pain at its worst? varies Sleep (in general) Fair  Pain is worse with: unsure Pain improves with:  time Relief from Meds:  na  Family History  Problem Relation Age of Onset   Cancer Mother     Congestive Heart Failure Father    Cancer Sister    Social History   Socioeconomic History   Marital status: Single    Spouse name: Not on file   Number of children: Not on file   Years of education: Not on file   Highest education level: Not on file  Occupational History   Not on file  Tobacco Use   Smoking status: Never   Smokeless tobacco: Never  Vaping Use   Vaping status: Never Used  Substance and Sexual Activity   Alcohol use: Not Currently    Comment: socially   Drug use: No   Sexual activity: Not on file  Other Topics Concern   Not on file  Social History Narrative   Not on file   Social Drivers of Health   Financial Resource Strain: Not on file  Food Insecurity: No Food Insecurity (02/27/2023)   Hunger Vital Sign    Worried About Running Out of Food in the Last Year: Never true    Ran Out of Food in the Last Year: Never true  Transportation Needs: No Transportation Needs (02/27/2023)   PRAPARE - Administrator, Civil Service (Medical): No    Lack of Transportation (Non-Medical): No  Physical Activity: Not on file  Stress: Not on file  Social Connections: Unknown (11/17/2021)   [facility] from [facility],  [facility]   Social Network    Social Network: Not on file   Past Surgical History:  Procedure Laterality Date   INCISION AND DRAINAGE ABSCESS Right 04/21/2021   Procedure: INCISION, DRAINAGE, DEBRIDEMENT OF RIGHT THIGH/GROIN INFECTION;  Surgeon: Vernetta Berg, MD;  Location: [facility];  Service: General;  Laterality: Right;   IRRIGATION AND DEBRIDEMENT ABSCESS Right 04/23/2021   Procedure: IRRIGATION AND DEBRIDEMENT RIGHT GROIN WOUND; RIGHT THIGH DRESSING CHANGE;  Surgeon: Vernetta Berg, MD;  Location: [facility];  Service: General;  Laterality: Right;   IRRIGATION AND DEBRIDEMENT ABSCESS Right 04/26/2021   Procedure: reexploration and debridement of soft tissue infection right groin;  Surgeon: Ebbie Cough, MD;  Location: [facility];   Service: General;  Laterality: Right;   RETINAL DETACHMENT SURGERY  2022   TONSILLECTOMY  2003   Past Surgical History:  Procedure Laterality Date   INCISION AND DRAINAGE ABSCESS Right 04/21/2021   Procedure: INCISION, DRAINAGE, DEBRIDEMENT OF RIGHT THIGH/GROIN INFECTION;  Surgeon: Vernetta Berg, MD;  Location: [facility];  Service: General;  Laterality: Right;   IRRIGATION AND DEBRIDEMENT ABSCESS Right 04/23/2021   Procedure: IRRIGATION AND DEBRIDEMENT RIGHT GROIN WOUND; RIGHT THIGH DRESSING CHANGE;  Surgeon: Vernetta Berg, MD;  Location: [facility];  Service: General;  Laterality: Right;   IRRIGATION AND DEBRIDEMENT ABSCESS Right 04/26/2021   Procedure: reexploration and debridement of soft tissue infection right groin;  Surgeon: Ebbie Cough, MD;  Location: [facility];  Service: General;  Laterality: Right;   RETINAL DETACHMENT SURGERY  2022   TONSILLECTOMY  2003   Past Medical History:  Diagnosis Date   CKD stage 3a, GFR 45-59 ml/min (HCC)    Diabetes mellitus    Hypertension    Necrotizing fasciitis (HCC) 04/2021   right groin   Stroke (HCC)    BP (!) 148/81   Pulse 63   Ht 5' 10 (1.778 m)   Wt 260 lb (117.9 kg)   SpO2 98%   BMI 37.31 kg/m   Opioid Risk Score:   Fall Risk Score:  `1  Depression screen [facility] 2/9     05/30/2023   11:47 AM 06/06/2021    2:09 PM  Depression screen PHQ 2/9  Decreased Interest 0 0  Down, Depressed, Hopeless 0 0  PHQ - 2 Score 0 0  Altered sleeping  1  Tired, decreased energy  1  Change in appetite  0  Feeling bad or failure about yourself   0  Trouble concentrating  0  Moving slowly or fidgety/restless  0  Suicidal thoughts  0  PHQ-9 Score  2     Review of Systems  Musculoskeletal:        Facial pain       Objective:   Physical Exam General No acute distress Motor and affect appropriate Motor strength is 5/5 bilateral deltoid by stress grip as well as hip flexor knee extensor ankle dorsiflexion  plantarflexion Sensation intact to pinprick bilateral facial as well as upper extremity. Positive Romberg falling to the left side with eyes closed He ambulates without assistive device no evidence toe drag and instability       Assessment & Plan:  1.  Brainstem infarct with truncal ataxia.  He is at risk for falls and uneven surfaces when carrying objects as well as under poor lighting condition.  We discussed this.  In addition he does have core indoors at this point which also complicates his balance.  He is going to build up the strength of the  next 2 months. At that point think he will be able to go back to work on full-time basis i.e. 10/08/2023. I do not need to see him back.  Follow-up with neurology 2.  Episode of shortness of breath we will follow-up with primary physician and subspecialists

## 2023-09-12 ENCOUNTER — Other Ambulatory Visit: Payer: Self-pay | Admitting: Physical Medicine and Rehabilitation

## 2024-01-16 ENCOUNTER — Ambulatory Visit (INDEPENDENT_AMBULATORY_CARE_PROVIDER_SITE_OTHER): Payer: Medicaid Other | Admitting: Neurology

## 2024-01-16 ENCOUNTER — Encounter: Payer: Self-pay | Admitting: Neurology

## 2024-01-16 VITALS — BP 140/88 | HR 80 | Ht 70.0 in | Wt 250.8 lb

## 2024-01-16 DIAGNOSIS — Z8673 Personal history of transient ischemic attack (TIA), and cerebral infarction without residual deficits: Secondary | ICD-10-CM | POA: Diagnosis not present

## 2024-01-16 DIAGNOSIS — G4733 Obstructive sleep apnea (adult) (pediatric): Secondary | ICD-10-CM | POA: Diagnosis not present

## 2024-01-16 DIAGNOSIS — T466X5A Adverse effect of antihyperlipidemic and antiarteriosclerotic drugs, initial encounter: Secondary | ICD-10-CM

## 2024-01-16 DIAGNOSIS — M791 Myalgia, unspecified site: Secondary | ICD-10-CM | POA: Diagnosis not present

## 2024-01-16 MED ORDER — COQ10 200 MG PO CAPS: 1.0000 | ORAL_CAPSULE | Freq: Every day | ORAL | 1 refills | Status: AC

## 2024-01-16 NOTE — Patient Instructions (Signed)
 I had a long d/w patient about his recent  brainstem stroke, risk for recurrent stroke/TIAs, personally independently reviewed imaging studies and stroke evaluation results and answered questions.Continue aspirin  81 mg daily  for secondary stroke prevention and maintain strict control of hypertension with blood pressure goal below 130/90, diabetes with hemoglobin A1c goal below 6.5% and lipids with LDL cholesterol goal below 70 mg/dL. I also advised the patient to eat a healthy diet with plenty of whole grains, cereals, fruits and vegetables, exercise regularly and maintain ideal body weight .continue participation in the sleep smart study and I counseled him to use his CPAP every night.  I recommend he start taking co-Q10 200 mg daily and magnesium to help with his statin myalgias.  Reduce the dose of Lipitor  to 40 mg daily and check lipid profile and hemoglobin A1c today.  I advised him to call his DME company to troubleshoot his CPAP mask and return for follow-up in 3 months with nurse practitioner for stroke and sleep apnea follow-up

## 2024-01-16 NOTE — Progress Notes (Signed)
 [facility] [address] [place]. [place] [address] [phone]       OFFICE FOLLOW-UP NOTE  [name] Date of Birth:  [birth year] Medical Record Number:  [id number]   HPI: Initial office visit 06/04/2023 [name] is a 38 year old African-American male seen today for initial office follow-up visit for stroke.  History is obtained from the patient and review of electronic medical records and I have personally reviewed pertinent available imaging films in PACS.  He has past medical history significant for hypertension, chronic kidney disease stage IIIa, diabetes and obesity.  He presented on initially 02/27/2023 with nausea vomiting for a day with intermittent positional dizziness and right-sided headache.  He appeared dehydrated in the emergency room and was treated with hydration he developed acute renal failure with creatinine of 3.6 and was admitted.  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  MR angiogram of the head and neck showed no large vessel stenosis or occlusion.  2D echo showed ejection fraction of 55 to 60%.  Left atrial size was normal.  There is no right-to-left shunt.  LDL cholesterol 130 mg percent.  Hemoglobin A1c was 13.8.  Patient was started on dual antiplatelet therapy aspirin  Plavix  for 3 weeks followed by aspirin  alone.  He was transferred for rehabilitation to rehab.  Patient states is done well since then.  He was enrolled in the sleep smart study and randomized to the CPAP treatment arm.  Patient is currently still doing outpatient physical therapy.  He can walk short distances independently but prefers to use a walker for longer distances.  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  He is still on medical leave.  He still has persistent tingling and numbness in the right side of the face which is intermittent but not  bothersome.  He has been compliant with using his CPAP every day.  He is tolerating Lipitor  well without muscle aches and pains and joint without bruising or bleeding.  States his blood pressure is under good control. Update 01/16/2024 : He returns for follow-up after last visit 6 months ago.  He states he is doing well.  Still has some mild residual gait ataxia as well as right facial paresthesias which are unchanged.  Is tolerating aspirin  well without any bruising and side effects.  States his blood pressures under good control.  He has been compliant with using his CPAP every night but had some questions about his mask.  He has not had any recent lab work.  He does complain of significant muscle cramps and muscle aches.  He is on Lipitor  80 mg daily and last lipid profile in 08/13/23 showed LDL to be 47 mg percent.  She has had no recurrent stroke or TIA symptoms.  She is able to ambulate independently without assistance but notices imbalance when he tries to walk fast and make a sudden movement ROS:   14 system review of systems is positive for tingling, numbness, difficulty walking all other systems negative  PMH:  Past Medical History:  Diagnosis Date   CKD stage 3a, GFR 45-59 ml/min (HCC)    Diabetes mellitus    Hypertension    Necrotizing fasciitis (HCC) 04/2021   right groin   Stroke [facility])     Social History:  Social History   Socioeconomic History   Marital status: Single    Spouse name:  Not on file   Number of children: Not on file   Years of education: Not on file   Highest education level: Not on file  Occupational History   Not on file  Tobacco Use   Smoking status: Never   Smokeless tobacco: Never  Vaping Use   Vaping status: Never Used  Substance and Sexual Activity   Alcohol use: Not Currently    Alcohol/week: 1.0 standard drink of alcohol    Types: 1 Cans of beer per week    Comment: occasionally   Drug use: No   Sexual activity: Not on file  Other Topics  Concern   Not on file  Social History Narrative   Not on file   Social Drivers of Health   Financial Resource Strain: Not on file  Food Insecurity: No Food Insecurity (02/27/2023)   Hunger Vital Sign    Worried About Running Out of Food in the Last Year: Never true    Ran Out of Food in the Last Year: Never true  Transportation Needs: No Transportation Needs (02/27/2023)   PRAPARE - Administrator, Civil Service (Medical): No    Lack of Transportation (Non-Medical): No  Physical Activity: Not on file  Stress: Not on file  Social Connections: Unknown (11/17/2021)   Received from [facility]   Social Network    Social Network: Not on file  Intimate Partner Violence: Not At Risk (02/27/2023)   Humiliation, Afraid, Rape, and Kick questionnaire    Fear of Current or Ex-Partner: No    Emotionally Abused: No    Physically Abused: No    Sexually Abused: No    Medications:   Current Outpatient Medications on File Prior to Visit  Medication Sig Dispense Refill   acetaminophen  (TYLENOL ) 325 MG tablet Take 650 mg by mouth as needed for mild pain or headache.     albuterol  (VENTOLIN  HFA) 108 (90 Base) MCG/ACT inhaler Inhale 1-2 puffs into the lungs every 6 (six) hours as needed for wheezing or shortness of breath. 18 g 0   amLODipine  (NORVASC ) 10 MG tablet Take 1 tablet by mouth daily.     atorvastatin  (LIPITOR ) 80 MG tablet Take 1 tablet by mouth daily.     benazepril  (LOTENSIN ) 20 MG tablet Take 1 tablet by mouth daily.     cloNIDine  (CATAPRES  - DOSED IN MG/24 HR) 0.1 mg/24hr patch Place onto the skin.     Continuous Glucose Sensor (DEXCOM G7 SENSOR) MISC Use 1 sensor every 10 days.     [facility] ASPIRIN  EC 81 MG tablet SMARTSIG:1 Tablet(s) By Mouth Daily     ezetimibe (ZETIA) 10 MG tablet Take 1 tablet by mouth daily.     hydrALAZINE  (APRESOLINE ) 100 MG tablet Take 1 tablet (100 mg total) by mouth every 8 (eight) hours. 90 tablet 0   insulin  aspart (NOVOLOG ) 100 UNIT/ML injection       Insulin  Disposable Pump (OMNIPOD 5 G6 INTRO, GEN 5,) KIT Use for insulin  administration.  Patient will be using Dexcom G7.     Insulin  Disposable Pump (OMNIPOD 5 G6 PODS, GEN 5,) MISC Use one pod every 72 hours for insulin  administration.  Patient will be using Dexcom G7 CGM.     labetalol  (NORMODYNE ) 300 MG tablet Take by mouth.     pantoprazole  (PROTONIX ) 40 MG tablet Take 1 tablet (40 mg total) by mouth daily. 30 tablet 0   sodium bicarbonate  650 MG tablet Take 1 tablet (650 mg total) by mouth 3 (  three) times daily. 90 tablet 0   amoxicillin -clavulanate (AUGMENTIN ) 875-125 MG tablet Take 1 tablet by mouth every 12 (twelve) hours. 14 tablet 0   azithromycin  (ZITHROMAX ) 250 MG tablet Take 1 tablet (250 mg total) by mouth daily. Take first 2 tablets together, then 1 every day until finished. (Patient not taking: Reported on 06/04/2023) 6 tablet 0   benazepril  (LOTENSIN ) 10 MG tablet Take 1 tablet by mouth daily. (Patient not taking: Reported on 01/16/2024)     cloNIDine  (CATAPRES  - DOSED IN MG/24 HR) 0.1 mg/24hr patch Place 1 patch (0.1 mg total) onto the skin once a week. Change every Friday (Patient not taking: Reported on 01/16/2024) 4 patch 12   clopidogrel  (PLAVIX ) 75 MG tablet Take 1 tablet (75 mg total) by mouth daily. (Patient not taking: Reported on 01/16/2024) 7 tablet 0   Insulin  Glargine (BASAGLAR  KWIKPEN) 100 UNIT/ML Inject 25 Units into the skin 2 (two) times daily. (Patient not taking: Reported on 01/16/2024)     senna (SENOKOT) 8.6 MG TABS tablet Take 1 tablet (8.6 mg total) by mouth 2 (two) times daily. (Patient not taking: Reported on 01/16/2024) 60 tablet 0   No current facility-administered medications on file prior to visit.    Allergies:   Allergies  Allergen Reactions   Ibuprofen Swelling    Pt reports taking after reported allergy w/ no reactions    Physical Exam General: Obese middle-aged African-American male, seated, in no evident distress Head: head normocephalic  and atraumatic.  Neck: supple with no carotid or supraclavicular bruits Cardiovascular: regular rate and rhythm, no murmurs Musculoskeletal: no deformity Skin:  no rash/petichiae Vascular:  Normal pulses all extremities Vitals:   01/16/24 1310  BP: (!) 140/88  Pulse: 80   Neurologic Exam Mental Status: Awake and fully alert. Oriented to place and time. Recent and remote memory intact. Attention span, concentration and fund of knowledge appropriate. Mood and affect appropriate.  Cranial Nerves: Fundoscopic exam reveals sharp disc margins. Pupils equal, briskly reactive to light. Extraocular movements full without nystagmus. Visual fields full to confrontation. Hearing intact. Facial sensation intact.  Leftt nasolabial fold asymmetry., tongue, palate moves normally and symmetrically.  Motor: Normal bulk and tone. Normal strength in all tested extremity muscles. Sensory.: intact to touch ,pinprick .position and vibratory sensation.  Subjective paresthesia on the right side of the face. Coordination: Rapid alternating movements normal in all extremities. Finger-to-nose and heel-to-shin performed accurately bilaterally. Gait and Station: Arises from chair without difficulty. Stance is normal. Gait demonstrates normal stride length and balance . Able to heel, toe and tandem walk with moderate difficulty.  Reflexes: 1+ and symmetric. Toes downgoing.   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  Vascular risk factors of hyperlipidemia, diabetes, obesity, sleep apnea.     PLAN:I had a long d/w patient about his recent  brainstem stroke, risk for recurrent stroke/TIAs, personally independently reviewed imaging studies and stroke evaluation results and answered questions.Continue aspirin  81 mg daily  for secondary stroke prevention and maintain strict  control of hypertension with blood pressure goal below 130/90, diabetes with hemoglobin A1c goal below 6.5% and lipids with LDL cholesterol goal below 70 mg/dL. I also advised the patient to eat a healthy diet with plenty of whole grains, cereals, fruits and vegetables, exercise regularly and maintain ideal body weight .continue participation in the sleep smart study and I counseled  him to use his CPAP every night.  I recommend he start taking co-Q10 200 mg daily and magnesium to help with his statin myalgias.  Reduce the dose of Lipitor  to 40 mg daily and check lipid profile and hemoglobin A1c today.  I advised him to call his DME company to troubleshoot his CPAP mask and return for follow-up in 3 months with nurse practitioner for stroke and sleep apnea follow-up  y.  Greater than 50% of time during this 35 minute visit was spent on counseling,explanation of diagnosis, planning of further management, discussion with patient and family and coordination of care Eather Popp, MD Note: This document was prepared with digital dictation and possible smart phrase technology. Any transcriptional errors that result from this process are unintentional

## 2024-01-17 LAB — LIPID PANEL
Chol/HDL Ratio: 3.9 ratio (ref 0.0–5.0)
Cholesterol, Total: 230 mg/dL — ABNORMAL HIGH (ref 100–199)
HDL: 59 mg/dL (ref >39)
LDL Chol Calc (NIH): 144 mg/dL — ABNORMAL HIGH (ref 0–99)
Triglycerides: 150 mg/dL — ABNORMAL HIGH (ref 0–149)
VLDL Cholesterol Cal: 27 mg/dL (ref 5–40)

## 2024-01-17 LAB — HEMOGLOBIN A1C
Est. average glucose Bld gHb Est-mCnc: 126 mg/dL
Hgb A1c MFr Bld: 6 % — ABNORMAL HIGH (ref 4.8–5.6)

## 2024-01-21 ENCOUNTER — Ambulatory Visit: Payer: Self-pay | Admitting: Neurology

## 2024-01-21 NOTE — Progress Notes (Signed)
 Kindly inform the patient that cholesterol profile is still not satisfactory and if he is indeed taking his Lipitor  80 mg daily I may need to add Praluent or Repatha injection if he is willing to take it.  Let me know and I can prescribe

## 2024-01-22 NOTE — Telephone Encounter (Addendum)
 LVM telling pt to call back for the below Lab Results per Dr. Rosemarie.   ----- Message from Eather Rosemarie sent at 01/21/2024  5:44 PM EDT ----- Burna inform the patient that cholesterol profile is still not satisfactory and if he is indeed taking his Lipitor  80 mg daily I may need to add Praluent or Repatha injection if he is willing to  take it.  Let me know and I can prescribe ----- Message ----- From: Interface, [facility] Lab Results In Sent: 01/17/2024   5:36 AM EDT To: Eather GORMAN Rosemarie, MD

## 2024-04-23 ENCOUNTER — Encounter: Payer: Self-pay | Admitting: Adult Health

## 2024-04-23 ENCOUNTER — Ambulatory Visit (INDEPENDENT_AMBULATORY_CARE_PROVIDER_SITE_OTHER): Admitting: Adult Health

## 2024-04-23 VITALS — BP 127/78 | HR 98 | Ht 70.0 in | Wt 248.0 lb

## 2024-04-23 DIAGNOSIS — G4733 Obstructive sleep apnea (adult) (pediatric): Secondary | ICD-10-CM | POA: Diagnosis not present

## 2024-04-23 DIAGNOSIS — Z8673 Personal history of transient ischemic attack (TIA), and cerebral infarction without residual deficits: Secondary | ICD-10-CM

## 2024-04-23 NOTE — Progress Notes (Signed)
 PATIENT: Bradley Davis DOB: 03/12/1986  REASON FOR VISIT: follow up HISTORY FROM: patient PRIMARY NEUROLOGIST: Dr. Rosemarie  Chief Complaint  Patient presents with   Cerebrovascular Accident    Rm 4 alone  Pt is well and stable, reports no new stroke concerns      HISTORY OF PRESENT ILLNESS: Today 04/23/24   Bradley Davis is a 38 y.o. male who has been followed in this office for stroke. Returns today to discuss CPAP.  The patient was included in the sleep apnea trial through the hospital.  This trial has not ended he is currently using a CPAP but is unable to get supplies.  He has found the CPAP beneficial.  Denies any additional strokelike symptoms.  His PCP is managing risk factors.  Currently on dialysis-longstanding history of diabetes.  Returns today for evaluation.  Update 01/16/2024 : He returns for follow-up after last visit 6 months ago.  He states he is doing well.  Still has some mild residual gait ataxia as well as right facial paresthesias which are unchanged.  Is tolerating aspirin  well without any bruising and side effects.  States his blood pressures under good control.  He has been compliant with using his CPAP every night but had some questions about his mask.  He has not had any recent lab work.  He does complain of significant muscle cramps and muscle aches.  He is on Lipitor  80 mg daily and last lipid profile in 08/13/23 showed LDL to be 47 mg percent.  She has had no recurrent stroke or TIA symptoms.  She is able to ambulate independently without assistance but notices imbalance when he tries to walk fast and make a sudden movement   HISTORY  06/04/2023 Bradley Davis is a 38 year old African-American male seen today for initial office follow-up visit for stroke.  History is obtained from the patient and review of electronic medical records and I have personally reviewed pertinent available imaging films in PACS.  He has past medical history significant for hypertension,  chronic kidney disease stage IIIa, diabetes and obesity.  He presented on initially 02/27/2023 with nausea vomiting for a day with intermittent positional dizziness and right-sided headache.  He appeared dehydrated in the emergency room and was treated with hydration he developed acute renal failure with creatinine of 3.6 and was admitted.  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  MR angiogram of the head and neck showed no large vessel stenosis or occlusion.  2D echo showed ejection fraction of 55 to 60%.  Left atrial size was normal.  There is no right-to-left shunt.  LDL cholesterol 130 mg percent.  Hemoglobin A1c was 13.8.  Patient was started on dual antiplatelet therapy aspirin  Plavix  for 3 weeks followed by aspirin  alone.  He was transferred for rehabilitation to rehab.  Patient states is done well since then.  He was enrolled in the sleep smart study and randomized to the CPAP treatment arm.  Patient is currently still doing outpatient physical therapy.  He can walk short distances independently but prefers to use a walker for longer distances.  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  He is still on medical leave.  He still has persistent tingling and numbness in the right side of the face which is intermittent but not bothersome.  He has been compliant with using his CPAP every  day.  He is tolerating Lipitor  well without muscle aches and pains and joint without bruising or bleeding.  States his blood pressure is under good control.  REVIEW OF SYSTEMS: Out of a complete 14 system review of symptoms, the patient complains only of the following symptoms, and all other reviewed systems are negative.   Listed in HPI  ALLERGIES: Allergies  Allergen Reactions   Ibuprofen Swelling    Pt reports taking after reported allergy w/ no reactions    HOME  MEDICATIONS: Outpatient Medications Prior to Visit  Medication Sig Dispense Refill   acetaminophen  (TYLENOL ) 325 MG tablet Take 650 mg by mouth as needed for mild pain or headache.     albuterol  (VENTOLIN  HFA) 108 (90 Base) MCG/ACT inhaler Inhale 1-2 puffs into the lungs every 6 (six) hours as needed for wheezing or shortness of breath. 18 g 0   amLODipine  (NORVASC ) 10 MG tablet Take 1 tablet by mouth daily.     atorvastatin  (LIPITOR ) 80 MG tablet Take 0.5 tablets by mouth daily.     benazepril  (LOTENSIN ) 10 MG tablet Take 1 tablet by mouth daily.     benazepril  (LOTENSIN ) 20 MG tablet Take 1 tablet by mouth daily.     cloNIDine  (CATAPRES  - DOSED IN MG/24 HR) 0.1 mg/24hr patch Place onto the skin.     Coenzyme Q10 (COQ10) 200 MG CAPS Take 1 capsule by mouth daily. 1 capsule 1   Continuous Glucose Sensor (DEXCOM G7 SENSOR) MISC Use 1 sensor every 10 days.     [facility] ASPIRIN  EC 81 MG tablet SMARTSIG:1 Tablet(s) By Mouth Daily     ezetimibe (ZETIA) 10 MG tablet Take 1 tablet by mouth daily.     hydrALAZINE  (APRESOLINE ) 100 MG tablet Take 1 tablet (100 mg total) by mouth every 8 (eight) hours. 90 tablet 0   insulin  aspart (NOVOLOG ) 100 UNIT/ML injection      Insulin  Disposable Pump (OMNIPOD 5 G6 INTRO, GEN 5,) KIT Use for insulin  administration.  Patient will be using Dexcom G7.     Insulin  Disposable Pump (OMNIPOD 5 G6 PODS, GEN 5,) MISC Use one pod every 72 hours for insulin  administration.  Patient will be using Dexcom G7 CGM.     Insulin  Glargine (BASAGLAR  KWIKPEN) 100 UNIT/ML Inject 25 Units into the skin 2 (two) times daily.     labetalol  (NORMODYNE ) 300 MG tablet Take by mouth.     pantoprazole  (PROTONIX ) 40 MG tablet Take 1 tablet (40 mg total) by mouth daily. 30 tablet 0   senna (SENOKOT) 8.6 MG TABS tablet Take 1 tablet (8.6 mg total) by mouth 2 (two) times daily. 60 tablet 0   sodium bicarbonate  650 MG tablet Take 1 tablet (650 mg total) by mouth 3 (three) times daily. 90 tablet 0    amoxicillin -clavulanate (AUGMENTIN ) 875-125 MG tablet Take 1 tablet by mouth every 12 (twelve) hours. 14 tablet 0   azithromycin  (ZITHROMAX ) 250 MG tablet Take 1 tablet (250 mg total) by mouth daily. Take first 2 tablets together, then 1 every day until finished. (Patient not taking: Reported on 06/04/2023) 6 tablet 0   cloNIDine  (CATAPRES  - DOSED IN MG/24 HR) 0.1 mg/24hr patch Place 1 patch (0.1 mg total) onto the skin once a week. Change every Friday (Patient not taking: Reported on 01/16/2024) 4 patch 12   No facility-administered medications prior to visit.    PAST MEDICAL HISTORY: Past Medical History:  Diagnosis Date   CKD stage 3a, GFR 45-59 ml/min (HCC)  Diabetes mellitus    Hypertension    Necrotizing fasciitis (HCC) 04/2021   right groin   Stroke [facility])     PAST SURGICAL HISTORY: Past Surgical History:  Procedure Laterality Date   INCISION AND DRAINAGE ABSCESS Right 04/21/2021   Procedure: INCISION, DRAINAGE, DEBRIDEMENT OF RIGHT THIGH/GROIN INFECTION;  Surgeon: Vernetta Berg, MD;  Location: [facility];  Service: General;  Laterality: Right;   IRRIGATION AND DEBRIDEMENT ABSCESS Right 04/23/2021   Procedure: IRRIGATION AND DEBRIDEMENT RIGHT GROIN WOUND; RIGHT THIGH DRESSING CHANGE;  Surgeon: Vernetta Berg, MD;  Location: [facility];  Service: General;  Laterality: Right;   IRRIGATION AND DEBRIDEMENT ABSCESS Right 04/26/2021   Procedure: reexploration and debridement of soft tissue infection right groin;  Surgeon: Ebbie Cough, MD;  Location: [facility];  Service: General;  Laterality: Right;   RETINAL DETACHMENT SURGERY  2022   TONSILLECTOMY  2003    FAMILY HISTORY: Family History  Problem Relation Age of Onset   Cancer Mother    Congestive Heart Failure Father    Cancer Sister     SOCIAL HISTORY: Social History   Socioeconomic History   Marital status: Single    Spouse name: Not on file   Number of children: Not on file   Years of education: Not on file    Highest education level: Not on file  Occupational History   Not on file  Tobacco Use   Smoking status: Never   Smokeless tobacco: Never  Vaping Use   Vaping status: Never Used  Substance and Sexual Activity   Alcohol use: Not Currently    Alcohol/week: 1.0 standard drink of alcohol    Types: 1 Cans of beer per week    Comment: occasionally   Drug use: No   Sexual activity: Not on file  Other Topics Concern   Not on file  Social History Narrative   Not on file   Social Drivers of Health   Financial Resource Strain: Not on file  Food Insecurity: Low Risk  (03/18/2024)   Received from [facility]   Hunger Vital Sign    Within the past 12 months, you worried that your food would run out before you got money to buy more: Never true    Within the past 12 months, the food you bought just didn't last and you didn't have money to get more. : Never true  Transportation Needs: No Transportation Needs (03/18/2024)   Received from [facility]    In the past 12 months, has lack of reliable transportation kept you from medical appointments, meetings, work or from getting things needed for daily living? : No  Physical Activity: Not on file  Stress: Not on file  Social Connections: Unknown (11/17/2021)   Received from [facility]   Social Network    Social Network: Not on file  Intimate Partner Violence: Not At Risk (02/27/2023)   Humiliation, Afraid, Rape, and Kick questionnaire    Fear of Current or Ex-Partner: No    Emotionally Abused: No    Physically Abused: No    Sexually Abused: No      PHYSICAL EXAM  Vitals:   04/23/24 1328  BP: 127/78  Pulse: 98  Weight: 248 lb (112.5 kg)  Height: 5' 10 (1.778 m)   Body mass index is 35.58 kg/m.  Generalized: Well developed, in no acute distress   Neurological examination  Mentation: Alert oriented to time, place, history taking. Follows all commands speech and language fluent Cranial nerve  II-XII: Pupils  were equal round reactive to light. Extraocular movements were full, visual field were full on confrontational test. Facial sensation and strength were normal. Uvula tongue midline. Head turning and shoulder shrug  were normal and symmetric. Motor: The motor testing reveals 5 over 5 strength of all 4 extremities. Good symmetric motor tone is noted throughout.  Sensory: Sensory testing is intact to soft touch on all 4 extremities. No evidence of extinction is noted.  Coordination: Cerebellar testing reveals good finger-nose-finger and heel-to-shin bilaterally.  Gait and station: Gait is normal.   DIAGNOSTIC DATA (LABS, IMAGING, TESTING) - I reviewed patient records, labs, notes, testing and imaging myself where available.  Lab Results  Component Value Date   WBC 8.7 07/05/2023   HGB 11.2 (L) 07/05/2023   HCT 34.2 (L) 07/05/2023   MCV 85.3 07/05/2023   PLT 386 07/05/2023      Component Value Date/Time   NA 141 07/05/2023 1620   K 4.0 07/05/2023 1620   CL 112 (H) 07/05/2023 1620   CO2 21 (L) 07/05/2023 1620   GLUCOSE 74 07/05/2023 1620   BUN 34 (H) 07/05/2023 1620   CREATININE 3.65 (H) 07/05/2023 1620   CALCIUM  8.9 07/05/2023 1620   PROT 6.4 (L) 04/24/2023 1438   ALBUMIN  3.1 (L) 04/24/2023 1438   AST 29 04/24/2023 1438   ALT 24 04/24/2023 1438   ALKPHOS 75 04/24/2023 1438   BILITOT 0.6 04/24/2023 1438   GFRNONAA 21 (L) 07/05/2023 1620   GFRAA  02/14/2008 1530    >60        The eGFR has been calculated using the MDRD equation. This calculation has not been validated in all clinical   Lab Results  Component Value Date   CHOL 230 (H) 01/16/2024   HDL 59 01/16/2024   LDLCALC 144 (H) 01/16/2024   TRIG 150 (H) 01/16/2024   CHOLHDL 3.9 01/16/2024   Lab Results  Component Value Date   HGBA1C 6.0 (H) 01/16/2024   No results found for: [id number] Lab Results  Component Value Date   TSH 0.871 02/28/2023      ASSESSMENT AND PLAN 38 y.o. year old male  has a past  medical history of CKD stage 3a, GFR 45-59 ml/min (HCC), Diabetes mellitus, Hypertension, Necrotizing fasciitis (HCC) (04/2021), and Stroke (HCC). here with:   HISTORY OF STROKE OSA on CPAP  Continue aspirin  81 mg daily  for secondary stroke prevention.  Discussed secondary stroke prevention measures and importance of close PCP follow up for aggressive stroke risk factor management. I have gone over the pathophysiology of stroke, warning signs and symptoms, risk factors and their management in some detail with instructions to go to the closest emergency room for symptoms of concern. HTN: BP goal <130/90.   HLD: LDL goal <70.  DMII: A1c goal<7.0.  Patient will complete a home sleep test.  Pending those results we will continue CPAP therapy and get him new supplies.   Orders Placed This Encounter  Procedures   Home sleep test     Duwaine Russell, MSN, NP-C 04/23/2024, 3:35 PM [facility] [address] [place], [place] [address] [phone]  The patient's condition requires frequent monitoring and adjustments in the treatment plan, reflecting the ongoing complexity of care.  This provider is the continuing focal point for all needed services for this condition.

## 2024-04-24 ENCOUNTER — Ambulatory Visit: Admitting: Adult Health

## 2024-07-29 ENCOUNTER — Ambulatory Visit: Admitting: Neurology

## 2024-07-29 DIAGNOSIS — G4733 Obstructive sleep apnea (adult) (pediatric): Secondary | ICD-10-CM

## 2024-07-31 NOTE — Procedures (Signed)
 "   [facility]  HOME SLEEP TEST (Watch PAT) REPORT  STUDY DATE: 07/29/2024  DOB: 01-Jul-1986  MRN: [id number]  ORDERING CLINICIAN: True Mar, MD, PhD   REFERRING CLINICIAN: Duwaine Russell, NP  CLINICAL INFORMATION/HISTORY (obtained from visit note dated 04/23/2024): 39 year old male with an underlying medical history of stroke, diabetes, chronic kidney disease, history of necrotizing fasciitis, and obesity, who was placed on PAP therapy for sleep apnea as part of a clinical trial.  He presents for evaluation of his OSA diagnosis.  He has benefited from PAP therapy and reports compliance with treatment.  BMI: 35.6 kg/m  FINDINGS:   Sleep Summary:   Total Recording Time (hours, min): 11 hours, 3 min  Total Sleep Time (hours, min):  7 hours, 2 min  Percent REM (%):    20.9%   Respiratory Indices:   Calculated pAHI (per hour):  23/hour         REM pAHI:    50.7/hour       NREM pAHI: 15.2/hour  Central pAHI: 2.0/hour  Oxygen Saturation Statistics:    Oxygen Saturation (%) Mean: 94%   Minimum oxygen saturation (%):                 85%   O2 Saturation Range (%): 85-99%    O2 Saturation (minutes) <=88%: 2.9 min  Pulse Rate Statistics:   Pulse Mean (bpm):    85/min    Pulse Range (51-145/min)   IMPRESSION: OSA (obstructive sleep apnea), moderate  RECOMMENDATION:  This home sleep test demonstrates moderate obstructive sleep apnea with a total AHI of 23/hour and O2 nadir of 85%.  Variable snoring was detected, ranging from mild to louder.  Ongoing treatment with a positive airway pressure (PAP) device is recommended. The patient is reportedly on home PAP therapy.  It is unclear, if he is on an AutoPap machine or CPAP at home.  A compliance download can help determine what treatment modality he is on.  I also recommend reviewing if his air seal from the mask is reasonable and his AHI below 5/h on treatment.   A full night titration study may be  considered to optimize treatment settings, monitor proper oxygen saturations and aid with improvement of tolerance and adherence, if needed down the road. Alternative treatment options may include a dental device through dentistry or orthodontics in selected patients or Inspire (hypoglossal nerve stimulator) in carefully selected patients (meeting inclusion criteria).  Concomitant weight loss is recommended (where clinically appropriate). Please note that untreated obstructive sleep apnea may carry additional perioperative morbidity. Patients with significant obstructive sleep apnea should receive perioperative PAP therapy and the surgeons and particularly the anesthesiologist should be informed of the diagnosis and the severity of the sleep disordered breathing. The patient should be cautioned not to drive, work at heights, or operate dangerous or heavy equipment when tired or sleepy. Review and reiteration of good sleep hygiene measures should be pursued with any patient. Other causes of the patient's symptoms, including circadian rhythm disturbances, an underlying mood disorder, medication effect and/or an underlying medical problem cannot be ruled out based on this test. Clinical correlation is recommended.  The patient and his referring provider will be notified of the test results. The patient will be seen in follow up in sleep clinic at [facility].  I certify that I have reviewed the raw data recording prior to the issuance of this report in accordance with the standards of the American Academy of Sleep Medicine (AASM).  INTERPRETING PHYSICIAN:   True Mar, MD, PhD Medical Director, [facility]) Diplomat, ABPN (Neurology and Sleep)   [facility] [address] [place], [place] [address] [phone]                     "

## 2024-07-31 NOTE — Progress Notes (Signed)
 See procedure note.

## 2024-08-05 ENCOUNTER — Ambulatory Visit: Payer: Self-pay | Admitting: Adult Health

## 2024-08-06 NOTE — Telephone Encounter (Signed)
 Spoke to patient gave sleep study results . Pt chose [employer] [facility] as DME . Gave pt [employer] # scheduled initial cpap f/u with Megan,NP 10/2024 . Pt aware will call back when [employer] lets us  know how to move forward with cpap machine . Pt expressed understanding and thanked me for calling

## 2024-08-06 NOTE — Telephone Encounter (Signed)
-----   Message from Duwaine Russell, NP sent at 08/05/2024  6:37 AM EST ----- Please let patient know HST showed moderate OSA. He has a machine that was given to him when he was in a research trial. He needs supplies. Can we see if the DME can do this. If not then we may have  to just order a new machine.

## 2024-10-29 ENCOUNTER — Encounter: Admitting: Adult Health
